# Patient Record
Sex: Female | Born: 1945 | Race: White | Hispanic: No | Smoking: Former smoker
Health system: Southern US, Community
[De-identification: ages and names within clinical notes are randomized; demographics above are authoritative.]

## PROBLEM LIST (undated history)

## (undated) DIAGNOSIS — M2550 Pain in unspecified joint: Secondary | ICD-10-CM

## (undated) DIAGNOSIS — R61 Generalized hyperhidrosis: Secondary | ICD-10-CM

## (undated) DIAGNOSIS — R52 Pain, unspecified: Secondary | ICD-10-CM

## (undated) DIAGNOSIS — R05 Cough: Secondary | ICD-10-CM

## (undated) DIAGNOSIS — Z973 Presence of spectacles and contact lenses: Secondary | ICD-10-CM

## (undated) DIAGNOSIS — R252 Cramp and spasm: Secondary | ICD-10-CM

## (undated) DIAGNOSIS — C50919 Malignant neoplasm of unspecified site of unspecified female breast: Secondary | ICD-10-CM

## (undated) DIAGNOSIS — I890 Lymphedema, not elsewhere classified: Secondary | ICD-10-CM

## (undated) DIAGNOSIS — I480 Paroxysmal atrial fibrillation: Secondary | ICD-10-CM

## (undated) DIAGNOSIS — M8580 Other specified disorders of bone density and structure, unspecified site: Secondary | ICD-10-CM

## (undated) DIAGNOSIS — R053 Chronic cough: Secondary | ICD-10-CM

## (undated) DIAGNOSIS — Z923 Personal history of irradiation: Secondary | ICD-10-CM

## (undated) DIAGNOSIS — J349 Unspecified disorder of nose and nasal sinuses: Secondary | ICD-10-CM

## (undated) DIAGNOSIS — I499 Cardiac arrhythmia, unspecified: Secondary | ICD-10-CM

## (undated) HISTORY — DX: Chronic cough: R05.3

## (undated) HISTORY — DX: Cramp and spasm: R25.2

## (undated) HISTORY — DX: Malignant neoplasm of unspecified site of unspecified female breast: C50.919

## (undated) HISTORY — DX: Paroxysmal atrial fibrillation: I48.0

## (undated) HISTORY — DX: Pain, unspecified: R52

## (undated) HISTORY — DX: Cough: R05

## (undated) HISTORY — DX: Pain in unspecified joint: M25.50

## (undated) HISTORY — DX: Presence of spectacles and contact lenses: Z97.3

## (undated) HISTORY — DX: Other specified disorders of bone density and structure, unspecified site: M85.80

## (undated) HISTORY — DX: Unspecified disorder of nose and nasal sinuses: J34.9

## (undated) HISTORY — PX: BREAST SURGERY: SHX581

## (undated) HISTORY — DX: Generalized hyperhidrosis: R61

---

## 1982-09-17 HISTORY — PX: TUBAL LIGATION: SHX77

## 2005-04-03 ENCOUNTER — Ambulatory Visit (HOSPITAL_COMMUNITY): Admission: RE | Admit: 2005-04-03 | Discharge: 2005-04-03 | Payer: Self-pay | Admitting: Internal Medicine

## 2005-04-13 ENCOUNTER — Encounter: Admission: RE | Admit: 2005-04-13 | Discharge: 2005-04-13 | Payer: Self-pay | Admitting: Internal Medicine

## 2007-02-11 ENCOUNTER — Ambulatory Visit (HOSPITAL_COMMUNITY): Admission: RE | Admit: 2007-02-11 | Discharge: 2007-02-11 | Payer: Self-pay | Admitting: Internal Medicine

## 2007-02-19 ENCOUNTER — Ambulatory Visit (HOSPITAL_COMMUNITY): Admission: RE | Admit: 2007-02-19 | Discharge: 2007-02-19 | Payer: Self-pay | Admitting: Internal Medicine

## 2007-08-27 ENCOUNTER — Ambulatory Visit (HOSPITAL_COMMUNITY): Admission: RE | Admit: 2007-08-27 | Discharge: 2007-08-27 | Payer: Self-pay | Admitting: Internal Medicine

## 2008-03-01 ENCOUNTER — Ambulatory Visit (HOSPITAL_COMMUNITY): Admission: RE | Admit: 2008-03-01 | Discharge: 2008-03-01 | Payer: Self-pay | Admitting: Internal Medicine

## 2008-09-17 HISTORY — PX: BREAST LUMPECTOMY: SHX2

## 2009-03-08 ENCOUNTER — Ambulatory Visit (HOSPITAL_COMMUNITY): Admission: RE | Admit: 2009-03-08 | Discharge: 2009-03-08 | Payer: Self-pay | Admitting: Internal Medicine

## 2009-03-11 ENCOUNTER — Ambulatory Visit (HOSPITAL_COMMUNITY): Admission: RE | Admit: 2009-03-11 | Discharge: 2009-03-11 | Payer: Self-pay | Admitting: Internal Medicine

## 2009-03-17 DIAGNOSIS — C50919 Malignant neoplasm of unspecified site of unspecified female breast: Secondary | ICD-10-CM

## 2009-03-17 HISTORY — DX: Malignant neoplasm of unspecified site of unspecified female breast: C50.919

## 2009-03-31 ENCOUNTER — Ambulatory Visit (HOSPITAL_COMMUNITY): Admission: RE | Admit: 2009-03-31 | Discharge: 2009-03-31 | Payer: Self-pay | Admitting: Internal Medicine

## 2009-04-11 ENCOUNTER — Encounter: Admission: RE | Admit: 2009-04-11 | Discharge: 2009-04-11 | Payer: Self-pay | Admitting: Surgery

## 2009-04-14 ENCOUNTER — Encounter: Admission: RE | Admit: 2009-04-14 | Discharge: 2009-04-14 | Payer: Self-pay | Admitting: Surgery

## 2009-04-14 ENCOUNTER — Encounter (INDEPENDENT_AMBULATORY_CARE_PROVIDER_SITE_OTHER): Payer: Self-pay | Admitting: Surgery

## 2009-04-14 ENCOUNTER — Ambulatory Visit (HOSPITAL_BASED_OUTPATIENT_CLINIC_OR_DEPARTMENT_OTHER): Admission: RE | Admit: 2009-04-14 | Discharge: 2009-04-14 | Payer: Self-pay | Admitting: Surgery

## 2009-04-26 ENCOUNTER — Ambulatory Visit: Admission: RE | Admit: 2009-04-26 | Discharge: 2009-07-25 | Payer: Self-pay | Admitting: Radiation Oncology

## 2009-05-09 ENCOUNTER — Encounter (INDEPENDENT_AMBULATORY_CARE_PROVIDER_SITE_OTHER): Payer: Self-pay | Admitting: Surgery

## 2009-05-09 ENCOUNTER — Ambulatory Visit (HOSPITAL_BASED_OUTPATIENT_CLINIC_OR_DEPARTMENT_OTHER): Admission: RE | Admit: 2009-05-09 | Discharge: 2009-05-09 | Payer: Self-pay | Admitting: Surgery

## 2009-05-17 ENCOUNTER — Ambulatory Visit (HOSPITAL_COMMUNITY): Payer: Self-pay | Admitting: Oncology

## 2009-05-24 ENCOUNTER — Ambulatory Visit (HOSPITAL_COMMUNITY): Admission: RE | Admit: 2009-05-24 | Discharge: 2009-05-24 | Payer: Self-pay | Admitting: Oncology

## 2009-08-05 ENCOUNTER — Ambulatory Visit (HOSPITAL_COMMUNITY): Payer: Self-pay | Admitting: Oncology

## 2009-09-06 ENCOUNTER — Other Ambulatory Visit: Admission: RE | Admit: 2009-09-06 | Discharge: 2009-09-06 | Payer: Self-pay | Admitting: Obstetrics & Gynecology

## 2009-09-17 HISTORY — PX: BREAST LUMPECTOMY: SHX2

## 2010-03-17 ENCOUNTER — Ambulatory Visit (HOSPITAL_COMMUNITY): Payer: Self-pay | Admitting: Oncology

## 2010-04-05 ENCOUNTER — Ambulatory Visit (HOSPITAL_COMMUNITY): Admission: RE | Admit: 2010-04-05 | Discharge: 2010-04-05 | Payer: Self-pay | Admitting: Internal Medicine

## 2010-04-24 ENCOUNTER — Encounter: Admission: RE | Admit: 2010-04-24 | Discharge: 2010-04-24 | Payer: Self-pay | Admitting: Obstetrics and Gynecology

## 2010-04-25 ENCOUNTER — Encounter: Admission: RE | Admit: 2010-04-25 | Discharge: 2010-04-25 | Payer: Self-pay | Admitting: Surgery

## 2010-04-25 ENCOUNTER — Ambulatory Visit (HOSPITAL_BASED_OUTPATIENT_CLINIC_OR_DEPARTMENT_OTHER): Admission: RE | Admit: 2010-04-25 | Discharge: 2010-04-25 | Payer: Self-pay | Admitting: Obstetrics and Gynecology

## 2010-05-01 IMAGING — MG MM BREAST WIRE LOCALIZATION*R*
5 series · 5 of 5 positions shown · non-contrast
Comparison: none

CLINICAL DATA: Patient presents for needle localization of a known
cancer at the 12 o'clock position of the right breast which is an
area of parenchymal distortion mammographically.  There are two
surgical micro clips at the recent biopsy site at the 12 o'clock
position as the more posterior clip will be used for localization
as this was noted to represent the biopsy site.

[R CC (1 of 2)]
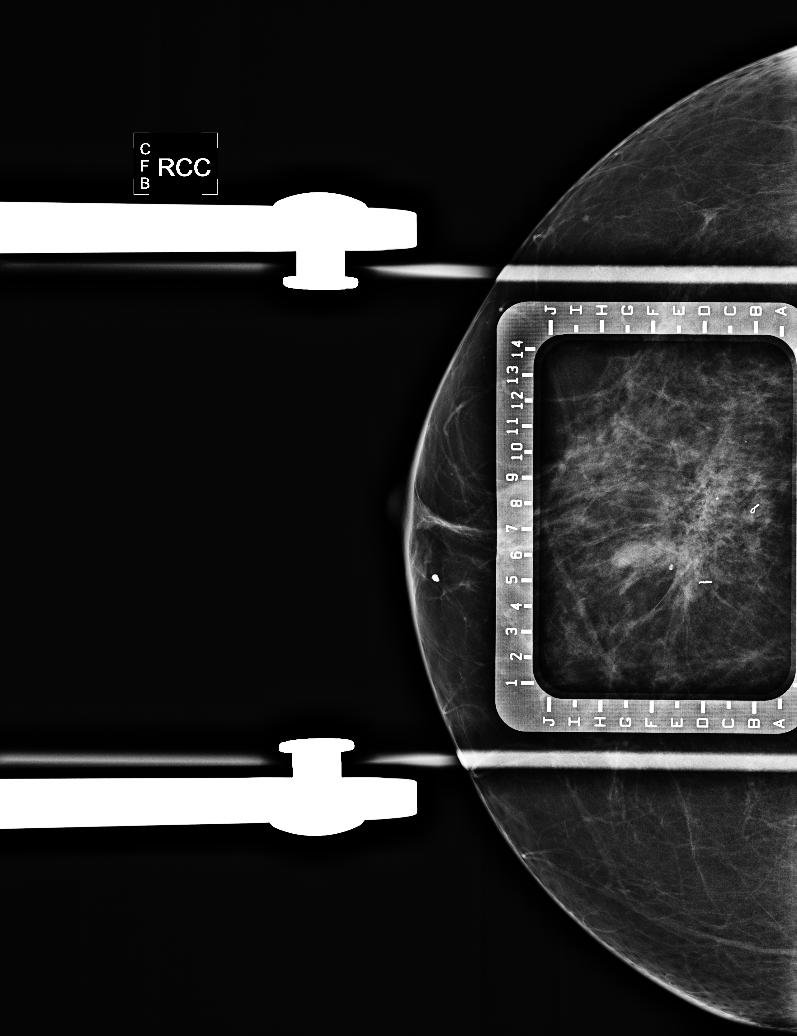

[R CC (2 of 2)]
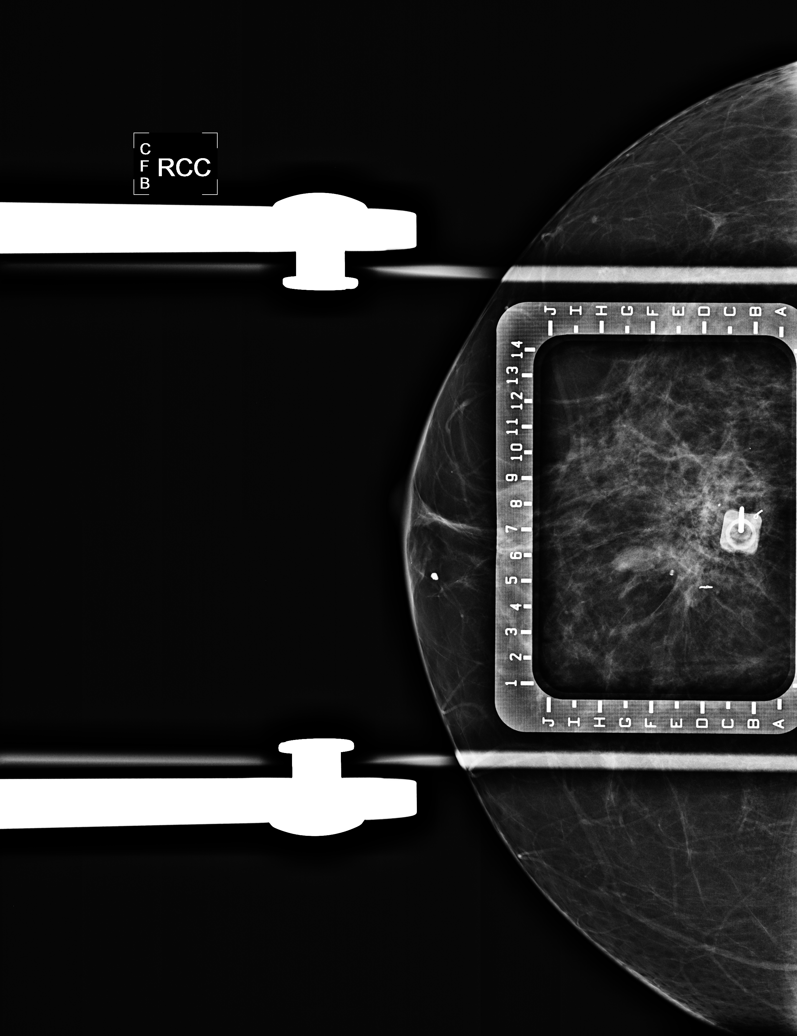

[R ML (1 of 3)]
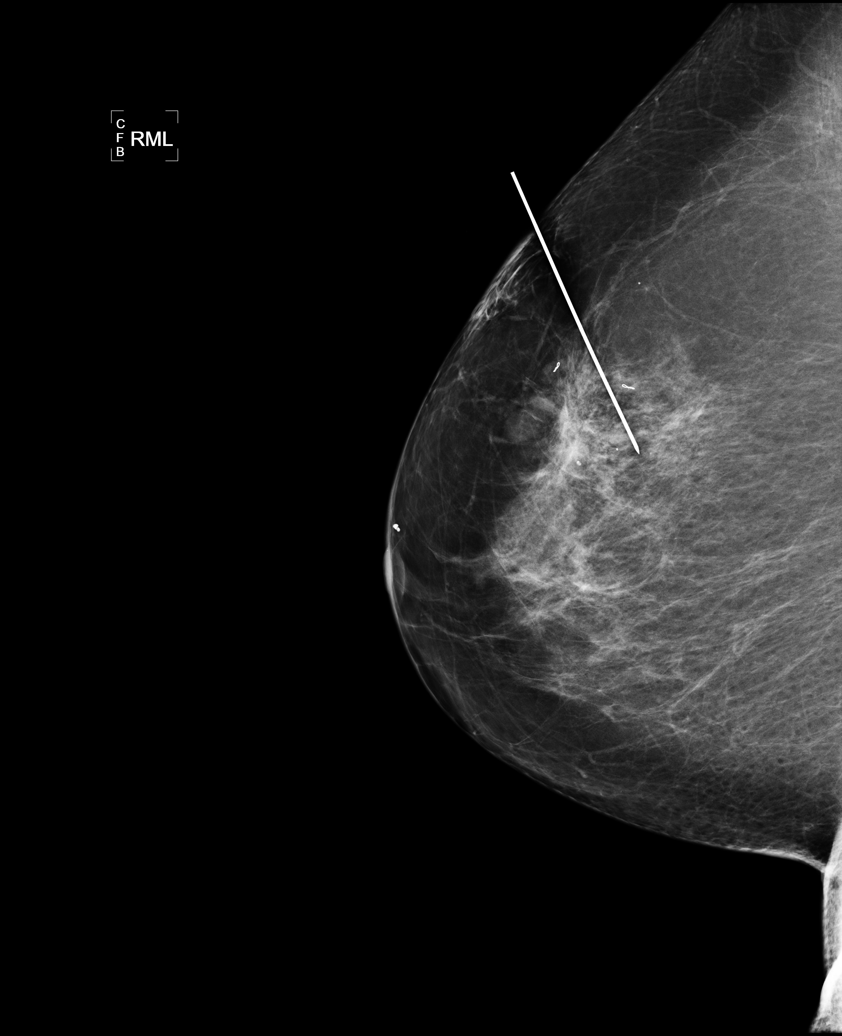

[R ML (2 of 3)]
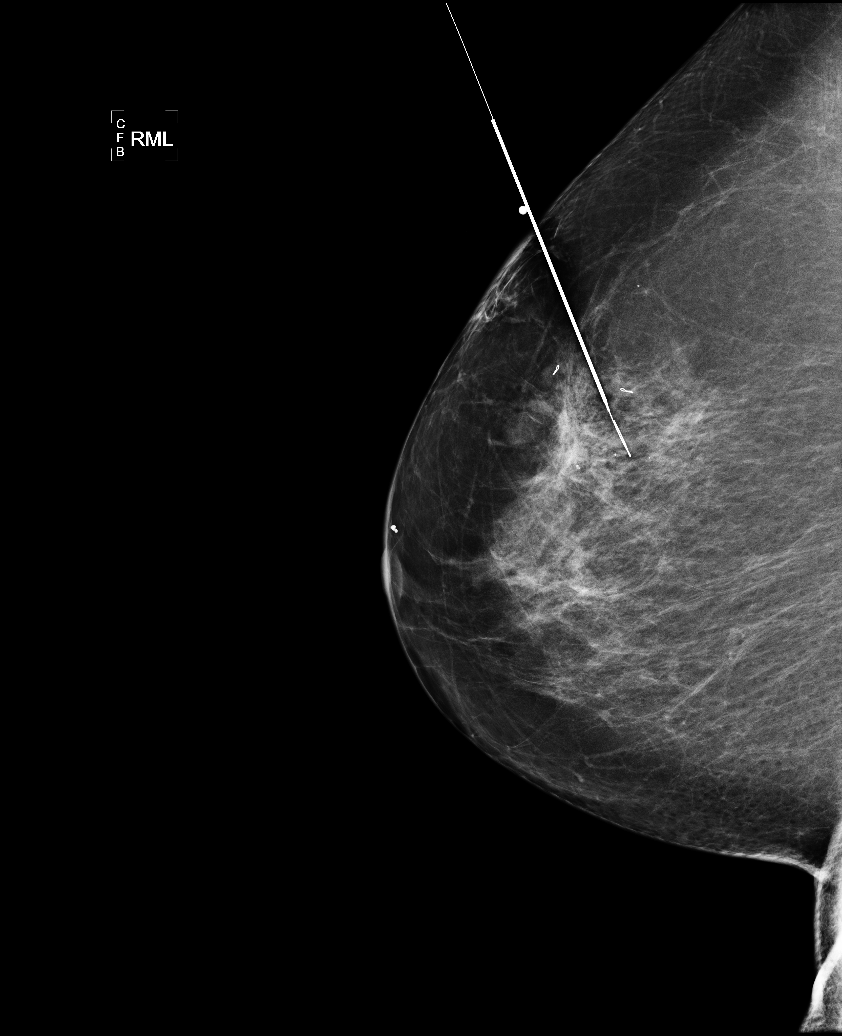

[R ML (3 of 3)]
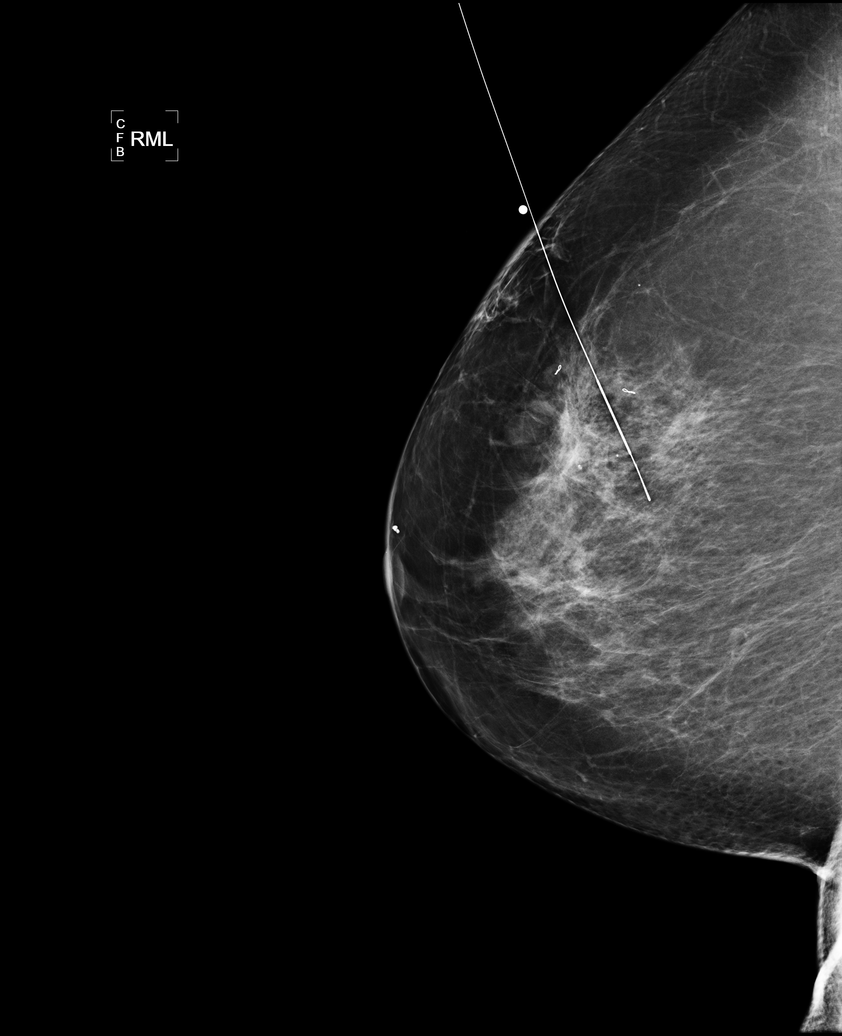

[5 of 5 positions shown; findings below may reference images not displayed]

NEEDLE LOCALIZATION WITH MAMMOGRAPHIC GUIDANCE AND SPECIMEN
RADIOGRAPH

Patient presents for needle localization prior to surgical
excision/lumpectomy.  I met with the patient and we discussed the
procedure of needle localization including risks, benefits, and
alternatives. Specifically, we discussed the risks of infection,
bleeding, tissue injury, and inadequate sampling. Informed written
consent was given.

Using mammographic guidance, sterile technique, 2% lidocaine and a
7 cm modified Kopans needle, the region of distortion marked by the
micro clip was localized using a superior approach.  The films are
marked for Dr. Shante.

Specimen radiograph was performed at day [HOSPITAL], and
confirms the targeted micro clips and parenchymal density present
in the tissue sample.  The specimen is marked for pathology.
IMPRESSION: Needle localization right breast.  No apparent complications.

## 2010-05-12 ENCOUNTER — Ambulatory Visit (HOSPITAL_COMMUNITY): Payer: Self-pay | Admitting: Oncology

## 2010-05-29 ENCOUNTER — Ambulatory Visit
Admission: RE | Admit: 2010-05-29 | Discharge: 2010-07-24 | Payer: Self-pay | Source: Home / Self Care | Admitting: Radiation Oncology

## 2010-08-15 ENCOUNTER — Ambulatory Visit (HOSPITAL_COMMUNITY): Payer: Self-pay | Admitting: Oncology

## 2010-08-15 ENCOUNTER — Encounter (HOSPITAL_COMMUNITY)
Admission: RE | Admit: 2010-08-15 | Discharge: 2010-09-14 | Payer: Self-pay | Source: Home / Self Care | Attending: Oncology | Admitting: Oncology

## 2010-09-26 ENCOUNTER — Other Ambulatory Visit
Admission: RE | Admit: 2010-09-26 | Discharge: 2010-09-26 | Payer: Self-pay | Source: Home / Self Care | Admitting: Obstetrics & Gynecology

## 2010-10-08 ENCOUNTER — Encounter: Payer: Self-pay | Admitting: Internal Medicine

## 2010-11-07 ENCOUNTER — Ambulatory Visit (HOSPITAL_COMMUNITY): Payer: Medicare Other | Admitting: Oncology

## 2010-11-07 ENCOUNTER — Encounter (HOSPITAL_COMMUNITY): Payer: Medicare Other | Attending: Oncology

## 2010-11-07 DIAGNOSIS — C50919 Malignant neoplasm of unspecified site of unspecified female breast: Secondary | ICD-10-CM

## 2010-11-07 DIAGNOSIS — R635 Abnormal weight gain: Secondary | ICD-10-CM | POA: Insufficient documentation

## 2010-11-07 DIAGNOSIS — Z79899 Other long term (current) drug therapy: Secondary | ICD-10-CM | POA: Insufficient documentation

## 2010-12-01 LAB — COMPREHENSIVE METABOLIC PANEL
ALT: 50 U/L — ABNORMAL HIGH (ref 0–35)
AST: 33 U/L (ref 0–37)
Albumin: 3.7 g/dL (ref 3.5–5.2)
Alkaline Phosphatase: 84 U/L (ref 39–117)
CO2: 25 mEq/L (ref 19–32)
Chloride: 106 mEq/L (ref 96–112)
Creatinine, Ser: 0.81 mg/dL (ref 0.4–1.2)
GFR calc Af Amer: >60 mL/min (ref >60)
GFR calc non Af Amer: >60 mL/min (ref >60)
Potassium: 4.1 mEq/L (ref 3.5–5.1)
Sodium: 138 mEq/L (ref 135–145)
Total Bilirubin: 0.5 mg/dL (ref 0.3–1.2)

## 2010-12-01 LAB — DIFFERENTIAL
Basophils Absolute: 0 10*3/uL (ref 0.0–0.1)
Eosinophils Absolute: 0.2 10*3/uL (ref 0.0–0.7)
Eosinophils Relative: 3 % (ref 0–5)
Lymphocytes Relative: 26 % (ref 12–46)
Monocytes Absolute: 0.7 10*3/uL (ref 0.1–1.0)

## 2010-12-01 LAB — LACTATE DEHYDROGENASE: LDH: 185 U/L (ref 94–250)

## 2010-12-01 LAB — CANCER ANTIGEN 27.29: CA 27.29: 26 U/mL (ref 0–39)

## 2010-12-01 LAB — CBC
Hemoglobin: 13.3 g/dL (ref 12.0–15.0)
MCH: 31.2 pg (ref 26.0–34.0)
Platelets: 194 10*3/uL (ref 150–400)
RBC: 4.26 MIL/uL (ref 3.87–5.11)
WBC: 5.7 10*3/uL (ref 4.0–10.5)

## 2010-12-24 LAB — COMPREHENSIVE METABOLIC PANEL
Albumin: 3.9 g/dL (ref 3.5–5.2)
BUN: 16 mg/dL (ref 6–23)
CO2: 25 mEq/L (ref 19–32)
Calcium: 8.9 mg/dL (ref 8.4–10.5)
Chloride: 108 mEq/L (ref 96–112)
Creatinine, Ser: 0.79 mg/dL (ref 0.4–1.2)
GFR calc non Af Amer: >60 mL/min (ref >60)
Total Bilirubin: 0.4 mg/dL (ref 0.3–1.2)

## 2010-12-24 LAB — CBC
HCT: 38.9 % (ref 36.0–46.0)
MCHC: 34.6 g/dL (ref 30.0–36.0)
MCV: 91 fL (ref 78.0–100.0)
Platelets: 232 10*3/uL (ref 150–400)
WBC: 6.5 10*3/uL (ref 4.0–10.5)

## 2010-12-24 LAB — DIFFERENTIAL
Basophils Absolute: 0 10*3/uL (ref 0.0–0.1)
Lymphocytes Relative: 30 % (ref 12–46)
Lymphs Abs: 2 10*3/uL (ref 0.7–4.0)
Neutro Abs: 3.9 10*3/uL (ref 1.7–7.7)

## 2010-12-24 LAB — LACTATE DEHYDROGENASE: LDH: 191 U/L (ref 94–250)

## 2011-01-24 ENCOUNTER — Encounter (INDEPENDENT_AMBULATORY_CARE_PROVIDER_SITE_OTHER): Payer: Self-pay | Admitting: Surgery

## 2011-01-30 NOTE — Op Note (Signed)
[name], [name]                ACCOUNT NO.:  [id number]   MEDICAL RECORD NO.:  [id number]          PATIENT TYPE:  AMB   LOCATION:  DSC                          FACILITY:  [facility]   PHYSICIAN:  [name]. [name], M.D.  DATE OF BIRTH:  [birth year]   DATE OF PROCEDURE:  04/14/2009  DATE OF DISCHARGE:                               OPERATIVE REPORT   Date of Surgery   PREOPERATIVE DIAGNOSIS:  Right breast carcinoma, approximately 12  o'clock position.   POSTOPERATIVE DIAGNOSIS:  Right breast carcinoma, (approximately 12  o'clock position), negative sentinel lymph node biopsy, stage T1c N0.   PROCEDURES:  1. Injection of methylene blue.  2. Right axillary sentinel lymph node biopsy.  3. Right breast lumpectomy using needle localization.   SURGEON:  [name]. [name], MD   FIRST ASSISTANT:  None.   ANESTHESIA:  General endotracheal.   ESTIMATED BLOOD LOSS:  100 mL.   DRAINS LEFT:  None.   INDICATIONS FOR PROCEDURE:  [name] is a 65 year old white female who  underwent a recent mammogram that showed an abnormal area in her right  breast.  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  Because of a history  of metal object in her eye, she is not a candidate for MRI.   I discussed with her the options for treatment.  She has elected to  proceed with a lumpectomy and sentinel lymph node biopsy.  I discussed  with her the indication, potential complications.  The potential  complications include, but not limited to, bleeding, infection, nerve  injury, recurrence of the tumor locally, or inadequate excision, which  would require further surgery.   OPERATIVE NOTE:  The patient was placed in a supine position, given a  general endotracheal anesthetic.  She had LMA in place.  She had PAS  stockings in place.  I had given antibiotics at the beginning of the  procedure.  Her chest and upper abdomen were prepped with  ChloraPrep and  sterilely draped, and a time-out was held identifying the patient and  the procedure.   [facility] a wire coming from a  right breast at about the 12 o'clock position, and I had marked her  right side with a pen before coming into the operating room.  A time-out  was held identifying the patient and the procedure.   She had also undergone an injection of a 1 mCi of technetium sulfur  colloid in the subareolar space.  At the beginning of the operation I  injected about 1.5 mL of 60% methylene blue.  At first, I went  identifying the sentinel lymph node which was in the right axilla, sort  of low.  I cut down and identified a hot node with counts of about 750  with a background about 10, but I did not see much in the way of blue  with the node.  This was sent to pathology, but sounds smear count 2  nodes.  Touch prep  was negative.   I then irrigated the wound and packed it open.  I then turned my  attention to the right breast where a wire was coming at about 4 or 5 cm  above her areola at the 12 o'clock position.  I made an incision into  the breast, cut down around the wire, taking out a block of breast  tissue approximately 6-7 cm in width.  I got to the very tip of the wire  which was about 2.5 cm beyond the clip, so I thought I had good room  both anteriorly and posteriorly and below the clip.   I used a paint kit to orient the specimen.  We then shot a specimen  mammogram, which confirmed both clips which had been placed in the  breast were in the specimen and the wire.  Once I confirmed the specimen  had both the clips and the wire in place, it was painted and this was  sent to pathology.  I then irrigated both the wounds, closed the  subcutaneous tissues with 3-0 Vicryl suture, the skin with a Dermabond,  painted each with tincture of benzoin and Steri-Strips.   The patient tolerated the procedure well.  Sponge and needle  counts were  correct at the end of the case.      [name]. [name], M.D.  Electronically Signed     DHN/MEDQ  D:  04/14/2009  T:  04/15/2009  Job:  [id number]   cc:   [name]. [name], MD  Dr. Winferd Humphrey

## 2011-01-30 NOTE — Op Note (Signed)
Heidi Lopez, Heidi Lopez                ACCOUNT NO.:  [id number]   MEDICAL RECORD NO.:  [id number]          PATIENT TYPE:  AMB   LOCATION:  DSC                          FACILITY:  [facility]   PHYSICIAN:  [name]. [name], M.D.  DATE OF BIRTH:  1946-07-03   DATE OF PROCEDURE:  05/09/2009  DATE OF DISCHARGE:                               OPERATIVE REPORT   Date of surgery ?   PREOPERATIVE DIAGNOSIS:  Right breast cancer at 12 o'clock position and  T1c, N0 with close inferior margins.   POSTOPERATIVE DIAGNOSIS:  Right breast cancer at 12 o'clock position,  T1c, N0 with close inferior margins.   PROCEDURE:  Right breast biopsy with excision of inferior margin of  prior breast biopsy cavity.   SURGEON:  [name]. [name], MD   FIRST ASSISTANT:  None.   ANESTHESIA:  LMA with approximately 30 mL of 0.25% Marcaine.   COMPLICATIONS:  None.   INDICATIONS FOR PROCEDURE:  Ms. Bunte is a 65 year old white female,  patient of Dr. Carylon Perches, who has a right breast cancer, treated with  lumpectomy and sentinel lymph node biopsy on April 14, 2009.  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  The patient  has been presented options and discussed, but tumor conference mostly at  the inferior margin more widely excised.   The indications and potential risks of the procedure ere explained to  the patient.  Potential risks include but not limited bleeding,  infection, nerve injury, recurrence of the tumor, and that she is going  to again have inadequate margins.   OPERATIVE NOTE:  The patient was placed in the supine position and given  a LMA anesthesia supervised by Dr. Heather Roberts.  The right breast was  prepped with DuraPrep and sterilely draped and a time-out was held  identifying the patient and procedure and we reviewed the surgical  checklist.   I then made an incision excising the old scar getting  down to a cavity.  Actually, her biopsy cavity was inferior to the incision by about 3 cm  across.  I excised the inferior wall of the cavity.  I marked it with a  long suture medially and a short suture anteriorly and figure the cavity  wall would be the superior margin.   I then irrigated the wound out with 2 L of saline, closed the  subcutaneous tissue with 3-0 Vicryl suture, the skin with a 5-0  subcuticular suture, painted the wound with a tincture of benzoin and  Steri-Strips.  The patient tolerated the procedure well.  Sponge and  needle counts were correct at the end of the case.  Final pathology is  pending at the time of this dictation.  The patient was transported to  the recovery room in good condition.      [name]. [name], M.D.  Electronically Signed     DHN/MEDQ  D:  05/09/2009  T:  05/10/2009  Job:  [id number]   cc:   [name]. [name],  MD  [name], MD  Ladona Horns. Mariel Sleet, MD

## 2011-03-09 ENCOUNTER — Other Ambulatory Visit (HOSPITAL_COMMUNITY): Payer: Self-pay | Admitting: Oncology

## 2011-03-09 DIAGNOSIS — Z9889 Other specified postprocedural states: Secondary | ICD-10-CM

## 2011-03-09 DIAGNOSIS — C50919 Malignant neoplasm of unspecified site of unspecified female breast: Secondary | ICD-10-CM

## 2011-04-06 ENCOUNTER — Other Ambulatory Visit (HOSPITAL_COMMUNITY): Payer: Self-pay | Admitting: Internal Medicine

## 2011-04-06 ENCOUNTER — Ambulatory Visit (HOSPITAL_COMMUNITY)
Admission: RE | Admit: 2011-04-06 | Discharge: 2011-04-06 | Disposition: A | Discharge status: Home / Self Care | Payer: Medicare Other | Source: Ambulatory Visit | Attending: Internal Medicine | Admitting: Internal Medicine

## 2011-04-06 DIAGNOSIS — R05 Cough: Secondary | ICD-10-CM

## 2011-04-06 DIAGNOSIS — Z853 Personal history of malignant neoplasm of breast: Secondary | ICD-10-CM | POA: Insufficient documentation

## 2011-04-06 DIAGNOSIS — R059 Cough, unspecified: Secondary | ICD-10-CM | POA: Insufficient documentation

## 2011-04-11 ENCOUNTER — Other Ambulatory Visit (HOSPITAL_COMMUNITY): Payer: Self-pay | Admitting: Oncology

## 2011-04-11 ENCOUNTER — Ambulatory Visit (HOSPITAL_COMMUNITY)
Admission: RE | Admit: 2011-04-11 | Discharge: 2011-04-11 | Disposition: A | Discharge status: Home / Self Care | Payer: Medicare Other | Source: Ambulatory Visit | Attending: Oncology | Admitting: Oncology

## 2011-04-11 DIAGNOSIS — Z9889 Other specified postprocedural states: Secondary | ICD-10-CM

## 2011-04-11 DIAGNOSIS — C50919 Malignant neoplasm of unspecified site of unspecified female breast: Secondary | ICD-10-CM

## 2011-04-11 DIAGNOSIS — Z853 Personal history of malignant neoplasm of breast: Secondary | ICD-10-CM | POA: Insufficient documentation

## 2011-04-12 ENCOUNTER — Ambulatory Visit (INDEPENDENT_AMBULATORY_CARE_PROVIDER_SITE_OTHER): Payer: Self-pay | Admitting: Surgery

## 2011-05-04 ENCOUNTER — Encounter (INDEPENDENT_AMBULATORY_CARE_PROVIDER_SITE_OTHER): Payer: Self-pay | Admitting: Surgery

## 2011-05-04 ENCOUNTER — Ambulatory Visit (INDEPENDENT_AMBULATORY_CARE_PROVIDER_SITE_OTHER): Payer: Medicare Other | Admitting: Surgery

## 2011-05-04 VITALS — Wt 184.0 lb

## 2011-05-04 DIAGNOSIS — Z853 Personal history of malignant neoplasm of breast: Secondary | ICD-10-CM | POA: Insufficient documentation

## 2011-05-04 NOTE — Progress Notes (Signed)
Chief Complaint  Patient presents with  . Other    f/u 6 month breast reck     Heidi Lopez is a 65 y.o. (DOB: [birth year])  white female who is a patient of FAGAN,ROY, MD and comes to me today for breast cancer followup.  Path (side, TNM): T1, N0 - Left      T1, N0 Invasive lobular - Right Surgery: left lumpectomy Date: 04/26/10       Right lumpectomy   Date:  04/14/2009 Size of tumor: 1.7 cm (left)       0.6 cm (right) Nodes: 0/4 (Left)         NODES: 0/1 (RIGHT)           (Right) ER: 96% PR: 33% Ki67: 80%   HER2Neu: neg (Left) ER: 9%    PR: 96%    Ki67: 9     HER2Neu: neg  Medical Oncologist: Ne[facility]strom Radiation Oncologist: Michell Heinrich  She is doing well.  Her two complaints are:  1.  Left breast spasms.  These seem to come and go.  2.  Increased hair on face. She wonders whether this is related to the tamoxifen.  She otherwise is in good spirits.  PHYSICAL EXAM: Wt 184 lb (83.462 kg)  HEENT:  Pupils equal.  Dentition good.  No injury. NECK:  Supple.  No thyroid mass. LYMPH NODES:  No cervical, supraclavicular, or axillary adenopathy. BREASTS -  RIGHT:  No palpable mass or nodule.  No nipple discharge.   LEFT:  No palpable mass or nodule.  No nipple discharge.  The left breast skin is more pigmented and thicker, probably secondary to more recent rad therapy. UPPER EXTREMITIES:  No evidence of lymphedema.  DATA REVIEWED: Mammogram neg - 04/11/11  ASSESSMENT and PLAN: 1.  T1, N0 left breast cancer. 2011.8.  Disease free.  ? Reason for left breast "spasms"  On tamoxifen  2  T1, N0  Right breast cancer 2010.7  3.  Tamoxifen  Mild hot flashes.  Some vaginal irritation.  She is trying a steroid cream for this.  Complains of hair on face, probably unrelated to the tamoxifen.  4.  Interstitial cystitis.  She's taking cranberry pills and stopped caffiene.  Things are better.

## 2011-05-04 NOTE — Patient Instructions (Signed)
Follow up in 6 months 

## 2011-05-08 ENCOUNTER — Encounter (HOSPITAL_COMMUNITY): Payer: Medicare Other | Attending: Oncology | Admitting: Oncology

## 2011-05-08 ENCOUNTER — Encounter (HOSPITAL_COMMUNITY): Payer: Self-pay | Admitting: Oncology

## 2011-05-08 VITALS — BP 124/73 | HR 68 | Temp 97.8°F | Wt 184.2 lb

## 2011-05-08 DIAGNOSIS — Z853 Personal history of malignant neoplasm of breast: Secondary | ICD-10-CM

## 2011-05-08 DIAGNOSIS — C50919 Malignant neoplasm of unspecified site of unspecified female breast: Secondary | ICD-10-CM

## 2011-05-08 DIAGNOSIS — Z79899 Other long term (current) drug therapy: Secondary | ICD-10-CM | POA: Insufficient documentation

## 2011-05-08 DIAGNOSIS — D0590 Unspecified type of carcinoma in situ of unspecified breast: Secondary | ICD-10-CM

## 2011-05-08 LAB — COMPREHENSIVE METABOLIC PANEL
Albumin: 4 g/dL (ref 3.5–5.2)
Alkaline Phosphatase: 70 U/L (ref 39–117)
BUN: 16 mg/dL (ref 6–23)
CO2: 28 mEq/L (ref 19–32)
Chloride: 100 mEq/L (ref 96–112)
Creatinine, Ser: 0.64 mg/dL (ref 0.50–1.10)
GFR calc Af Amer: >60 mL/min (ref >60)
GFR calc non Af Amer: >60 mL/min (ref >60)
Glucose, Bld: 97 mg/dL (ref 70–99)
Potassium: 4.2 mEq/L (ref 3.5–5.1)
Total Bilirubin: 0.3 mg/dL (ref 0.3–1.2)

## 2011-05-08 LAB — CBC
Hemoglobin: 13.1 g/dL (ref 12.0–15.0)
RBC: 4.2 MIL/uL (ref 3.87–5.11)
WBC: 6.5 10*3/uL (ref 4.0–10.5)

## 2011-05-08 NOTE — Patient Instructions (Signed)
[facility]  Discharge Instructions  RECOMMENDATIONS MADE BY THE CONSULTANT AND ANY TEST RESULTS WILL BE SENT TO YOUR REFERRING DOCTOR.     MEDICATIONS PRESCRIBED: You may use Replense for the feminine itching.    SPECIAL INSTRUCTIONS/FOLLOW-UP: Return to clinic in 6 months. Call with any issues or concerns prior to appointment.    I acknowledge that I have been informed and understand all the instructions given to me and received a copy. I do not have any more questions at this time, but understand that I may call the Specialty Clinic at [facility] at [phone] during business hours should I have any further questions or need assistance in obtaining follow-up care.    __________________________________________  _____________  __________ Signature of Patient or Authorized Representative            Date                   Time    __________________________________________ Nurse's Signature

## 2011-05-08 NOTE — Progress Notes (Signed)
CC:   Heidi Lopez. [name], MD [name], M.D. [name]. [name], M.D.  DIAGNOSES: 1. Left-sided breast cancer, invasive ductal type, 1.7 cm in size with     lymphovascular invasion with 4 negative nodes.  It was estrogen     receptor positive at 95%, progesterone receptor positive at 96%, Ki-     67 marker low at 9%, HER2/neu negative, and she had a lumpectomy,     followed by radiation therapy with surgery on 04/26/2010.  She     finished radiation on 07/21/2010 and started tamoxifen on     08/16/2010. 2. 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  She had a lumpectomy and     sentinel node biopsy that was negative.  She had radiation therapy     then.  Estrogen receptor was 96%, progesterone receptor was 33%, Ki-     67 marker low at 8%, and HER2/neu was negative.  She not take     hormonal therapy after the radiation at that time. 3. History of interstitial cystitis, treated by Dr. Despina Hidden. 4. Mild obesity, weighing 184 pounds, but it is down from 189 pounds. 5. Left breast cyst removal 20+ years ago by Dr. Francina Ames.  Heidi Lopez also had a bronchitis about 6 to 8 weeks ago, treated with 2 antibiotics, she states by Dr. [name] which finally cleared.  She has some postnasal drip now and still coughs once in a while, but feels fine.  Her biggest problem is shooting pains in the left breast periodically that only last 1 to 3 seconds and then they are gone.  They feel like spasm, she states.  I suspect they are from nerves trying to regenerate.  Other than that, she is not aware of any lumps anywhere and she has no burning on urination presently.  She does have a little itching in the vaginal area since starting the tamoxifen, but no discharge.  She is not sexually active presently, but at one time when she was married, she had multiple urinary tract  infections.  This does not feel like the same.  REVIEW OF SYSTEMS:  The rest of her oncologic review of systems is negative.  PHYSICAL EXAMINATION:  Vital Signs:  Show that her weight is 184 pounds, blood pressure 124/73.  She is afebrile, pulse 68 and regular, respirations 16 and unlabored.  Lymph:  She has no lymphadenopathy in any location, including cervical, supraclavicular, infraclavicular, axillary, and inguinal areas.  Abdomen:  Soft, nontender, without organomegaly.  Bowel sounds are normal.  Heart:  Shows a regular rhythm and rate without murmur, rub, or gallop.  Lungs:  Clear to auscultation and percussion.  Neck:  She has no thyromegaly.  Extremities:  She has no arm or leg edema.  Breasts:  The breast exam on the right shows only surgical changes and the left shows mild erythema, really more pinkness than anything else and surgical changes.  There is no mass in either breast.  Neurologic:  She is alert and oriented.  Heidi Lopez is going to try Replens twice a day and if she is not better will call me.  I would consider low-dose estrogen cream 2 or 3 times a week to see if that would help symptomatically since she is pretty bothered by the itching.  Again she has no rash that she is aware  of and no discharge.  We will see her back in 6 months.  She will continue the tamoxifen for a total of 5 years.  We will get some baseline blood work today since it has been some time since we had any and she told me also she had a chest x-ray about 2 months ago from Dr. [name] because of the cold.  She states that she was worried about her lungs being involved by the tumor, which is why she wanted it done.    ______________________________ Ladona Horns. Mariel Sleet, MD ESN/MEDQ  D:  05/08/2011  T:  05/08/2011  Job:  [id number]

## 2011-09-28 ENCOUNTER — Ambulatory Visit (HOSPITAL_COMMUNITY)
Admission: RE | Admit: 2011-09-28 | Discharge: 2011-09-28 | Disposition: A | Discharge status: Home / Self Care | Payer: Medicare Other | Source: Ambulatory Visit | Attending: Internal Medicine | Admitting: Internal Medicine

## 2011-09-28 ENCOUNTER — Other Ambulatory Visit (HOSPITAL_COMMUNITY): Payer: Self-pay | Admitting: Internal Medicine

## 2011-09-28 DIAGNOSIS — M25572 Pain in left ankle and joints of left foot: Secondary | ICD-10-CM

## 2011-09-28 DIAGNOSIS — M25579 Pain in unspecified ankle and joints of unspecified foot: Secondary | ICD-10-CM | POA: Insufficient documentation

## 2011-10-08 ENCOUNTER — Other Ambulatory Visit (HOSPITAL_COMMUNITY)
Admission: RE | Admit: 2011-10-08 | Discharge: 2011-10-08 | Disposition: A | Discharge status: Home / Self Care | Payer: Medicare Other | Source: Ambulatory Visit | Attending: Obstetrics & Gynecology | Admitting: Obstetrics & Gynecology

## 2011-10-09 ENCOUNTER — Other Ambulatory Visit: Payer: Self-pay | Admitting: Obstetrics & Gynecology

## 2011-10-16 ENCOUNTER — Ambulatory Visit (HOSPITAL_COMMUNITY)
Admission: RE | Admit: 2011-10-16 | Discharge: 2011-10-16 | Disposition: A | Discharge status: Home / Self Care | Payer: Medicare Other | Source: Ambulatory Visit | Attending: Orthopedic Surgery | Admitting: Orthopedic Surgery

## 2011-10-16 DIAGNOSIS — M25673 Stiffness of unspecified ankle, not elsewhere classified: Secondary | ICD-10-CM | POA: Insufficient documentation

## 2011-10-16 DIAGNOSIS — M25579 Pain in unspecified ankle and joints of unspecified foot: Secondary | ICD-10-CM | POA: Insufficient documentation

## 2011-10-16 DIAGNOSIS — M6281 Muscle weakness (generalized): Secondary | ICD-10-CM | POA: Insufficient documentation

## 2011-10-16 DIAGNOSIS — R262 Difficulty in walking, not elsewhere classified: Secondary | ICD-10-CM | POA: Insufficient documentation

## 2011-10-16 DIAGNOSIS — IMO0001 Reserved for inherently not codable concepts without codable children: Secondary | ICD-10-CM | POA: Insufficient documentation

## 2011-10-16 DIAGNOSIS — M25676 Stiffness of unspecified foot, not elsewhere classified: Secondary | ICD-10-CM | POA: Insufficient documentation

## 2011-10-16 NOTE — Patient Instructions (Addendum)
HEP

## 2011-10-16 NOTE — Evaluation (Signed)
Physical Therapy Evaluation  Patient Details  Name: Heidi Lopez MRN: [id number] Date of Birth: April 22, 1946  Today's Date: 10/16/2011 Time: 6962-9528 Time Calculation (min): 53 min  Visit#: 1  of 12   Re-eval: 11/15/11 Assessment Diagnosis: L posterior tibial tendonitis Next MD Visit: 11/10/11 Prior Therapy: none  Past Medical History:  Past Medical History  Diagnosis Date  . Cancer of breast July 2010  . Glaucoma   . Cramps, muscle, general     severe breast cramping   . Heart palpitations   . Night sweats   . Pain     breast pain  . Chronic coughing   . Sinus problem   . Wears glasses   . Glaucoma     left eye   . Joint pain     foot and knee pain   Past Surgical History:  Past Surgical History  Procedure Date  . Breast surgery 2010    for cancer  . Tubal ligation 1984    Subjective Symptoms/Limitations Symptoms: [name] states that she began to have pain about two years ago that was intermittent.  She states that since Christmas her pain has been constant.  The patient states the pain starts on the inside of her foot and then if she stays up on her leg to long the pain will go up her leg.  She was placed in a Cam boot for approximately a month.  She has 50% less pain when she has her boot on but as soon as she takes it off she notices almost immediate pain.  She is now being referred to therapy to attempt to decrease her pain. How long can you sit comfortably?: no problem. How long can you stand comfortably?: Pt states that she does not give into the pain but she has pain almost immediately upon standing. How long can you walk comfortably?: Pt has pain almost immediatly.Marland Kitchen Special Tests: Prior to taking her medication which was given her a week ago she was waking several times a night due to the pain. Pain Assessment Currently in Pain?: No/denies (worst pain in the past week has been a 9) Pain Location: Ankle Pain Orientation: Left Pain Type: Chronic pain Pain  Onset: More than a month ago Pain Frequency: Constant Pain Relieving Factors: medication; camboot Effect of Pain on Daily Activities: increases pain.  Precautions/Restrictions     Prior Functioning  Prior Function Vocation: Retired Leisure: Hobbies-yes (Comment) Comments: walking, gardening  Cognition/Observation Observation/Other Assessments Observations: Pt L foot pronates while standing Other Assessments: L foot with increased swelling:  figure 8 R 51.0cm L 52.5 cm  Sensation/Coordination/Flexibility/Functional Tests    Assessment LLE AROM (degrees) Left Ankle Dorsiflexion 0-20: 20  Left Ankle Plantar Flexion 0-45: 45  Left Ankle Inversion: 35  Left Ankle Eversion: 15  LLE Strength Left Ankle Dorsiflexion: 5/5 Left Ankle Plantar Flexion: 5/5 Left Ankle Inversion: 5/5 Left Ankle Eversion: 5/5 Palpation Palpation: post tib tendon tight to palpation   Exercise/Treatments    Ankle Exercises Ankle Circles/Pumps: Limitations Ankle Circles/Pumps Limitations: Gastroc stretch x 1' x 2 Ankle Inversion: AROM;Strengthening;Left;10 reps;Sidelying  Towel Crunch: 2 reps    Modalities Modalities: Ultrasound Manual Therapy Manual Therapy: Massage Massage: from insertion tendon on plantar aspect of foot to mid leg  Ultrasound Ultrasound Location: plantar aspect of foot. Ultrasound Parameters: 1.5 w/cm2  x 8' to increase blood supply and relax mm Ultrasound Goals: Pain  Physical Therapy Assessment and Plan PT Assessment and Plan Clinical Impression Statement: Pt with increased  pain and decreased ability to walk who will benefit from skilled Pt to decrease her sx of pain and return pt to prior functional level. Rehab Potential: Good PT Frequency: Min 3X/week PT Duration: 4 weeks PT Treatment/Interventions: Therapeutic exercise;Other (comment) (modalities to decrease pain) PT Plan: continue to see pt three times a week to decrease sx of pain and improve ability to walk.   Pt to begin marble pick up; PF at leg press with emphaiss of slow return; isometric and t-band for inversion    Goals Home Exercise Program Pt will Perform Home Exercise Program: Independently PT Short Term Goals Time to Complete Short Term Goals: 2 weeks PT Short Term Goal 1: Pain level no greater than a 5  PT Long Term Goals Time to Complete Long Term Goals: 4 weeks PT Long Term Goal 1: Pt to be I in advance HEP PT Long Term Goal 2: Pt pain  level to be no greater than a 3 and at 0 50% of the day. Long Term Goal 3: Pt to have no pain with mm testing Long Term Goal 4: Pt to be able to ambulate 1/2 mile without having increased pain.  Problem List Patient Active Problem List  Diagnoses  . History of breast cancer, bilateral  . Pain in joint, ankle and foot    PT - End of Session Activity Tolerance: Patient tolerated treatment well General Behavior During Session: [facility] for tasks performed Cognition: [facility] for tasks performed   Trevione Wert,CINDY 10/16/2011, 3:06 PM  Physician Documentation Your signature is required to indicate approval of the treatment plan as stated above.  Please sign and either send electronically or make a copy of this report for your files and return this physician signed original.   Please mark one 1.__approve of plan  2. ___approve of plan with the following conditions.   ______________________________                                                          _____________________ Physician Signature                                                                                                             Date

## 2011-10-18 ENCOUNTER — Ambulatory Visit (HOSPITAL_COMMUNITY)
Admission: RE | Admit: 2011-10-18 | Discharge: 2011-10-18 | Disposition: A | Discharge status: Home / Self Care | Payer: Medicare Other | Source: Ambulatory Visit | Attending: Orthopedic Surgery | Admitting: Orthopedic Surgery

## 2011-10-18 NOTE — Progress Notes (Signed)
Physical Therapy Treatment Patient Details  Name: Heidi Lopez MRN: [id number] Date of Birth: Jul 08, 1946  Today's Date: 10/18/2011 Time: 4098-1191 Time Calculation (min): 38 min Visit#: 2  of 12   Re-eval: 11/15/11 Charges:  therex 12', ultrasound 8', manual 10'    Subjective: Symptoms/Limitations Symptoms: Pt. states her foot felt better after last session.  Pt. reports the boot has helped alot. Pain Assessment Currently in Pain?: No/denies   Exercise/Treatments Stretches Gastroc Stretch: 2 reps;60 seconds Seated Other Seated Knee Exercises: toe crunches 2 minutes Other Seated Knee Exercises: Marble pick up 1RT Supine Other Supine Knee Exercises: isometric inversion 5X5" Other Supine Knee Exercises: tband inversion green 10X   Modalities Modalities: Ultrasound Manual Therapy: Massage Massage: Plantar surface of L foot to mid LE following ultrasound. Ultrasound Ultrasound Location: plantar aspect of L foot Ultrasound Parameters: 1.5w/cm2 X 8' to increase blood supply and relax mm  Physical Therapy Assessment and Plan PT Assessment and Plan Clinical Impression Statement: Added isometric and tband strengthening for ankle inversion as well as marble pick up without difficulty. Some tenderness voiced mid medial LE with STM. PT Plan: Add leg press for plantar fexion with emphasis on eccentric phase; give tband for HEP next visit.     Problem List Patient Active Problem List  Diagnoses  . History of breast cancer, bilateral  . Pain in joint, ankle and foot    PT - End of Session Activity Tolerance: Patient tolerated treatment well General Behavior During Session: [facility] for tasks performed Cognition: [facility] for tasks performed  Barrington Worley B. Bascom Levels, PTA 10/18/2011, 3:31 PM

## 2011-10-22 ENCOUNTER — Ambulatory Visit (HOSPITAL_COMMUNITY)
Admission: RE | Admit: 2011-10-22 | Discharge: 2011-10-22 | Disposition: A | Discharge status: Home / Self Care | Payer: Medicare Other | Source: Ambulatory Visit | Attending: Internal Medicine | Admitting: Internal Medicine

## 2011-10-22 ENCOUNTER — Other Ambulatory Visit (HOSPITAL_COMMUNITY): Payer: Self-pay | Admitting: Oncology

## 2011-10-22 DIAGNOSIS — M6281 Muscle weakness (generalized): Secondary | ICD-10-CM | POA: Insufficient documentation

## 2011-10-22 DIAGNOSIS — IMO0001 Reserved for inherently not codable concepts without codable children: Secondary | ICD-10-CM | POA: Insufficient documentation

## 2011-10-22 DIAGNOSIS — M25673 Stiffness of unspecified ankle, not elsewhere classified: Secondary | ICD-10-CM | POA: Insufficient documentation

## 2011-10-22 DIAGNOSIS — M25579 Pain in unspecified ankle and joints of unspecified foot: Secondary | ICD-10-CM

## 2011-10-22 DIAGNOSIS — M25676 Stiffness of unspecified foot, not elsewhere classified: Secondary | ICD-10-CM | POA: Insufficient documentation

## 2011-10-22 DIAGNOSIS — R262 Difficulty in walking, not elsewhere classified: Secondary | ICD-10-CM | POA: Insufficient documentation

## 2011-10-22 NOTE — Progress Notes (Signed)
Physical Therapy Treatment Patient Details  Name: Heidi Lopez MRN: [id number] Date of Birth: 01/06/1946  Today's Date: 10/22/2011 Time: 4098-1191 (Pt started my Donnamae Jude, PT) Time Calculation (min): 55 min Visit#: 3  of 12   Re-eval: 11/15/11 Charges: Manual x 5' [place] x 8' Therex x 25'  Subjective: Symptoms/Limitations Symptoms: Pt states she went shopping over the weekend but needed to stop after an hour due to increased pain. Pain Assessment Currently in Pain?: Yes Pain Score:   3 Pain Location: Foot Pain Orientation: Left Pain Type: Chronic pain   Exercise/Treatments Ankle Exercises Ankle Plantar Flexion: Limitations Ankle Plantar Flexion Limitations: Gastro/plantar /soleus and slant board stretch 3 x 30 sec. Ankle Inversion: Strengthening;Left;Sidelying;Limitations Ankle Inversion Limitations: 2# ABC's: Limitations ABC's Limitations: cybex plantar slow slow up 3pl x 10 Marble pick up x 1 RT  Manual Therapy Massage: To medial foot and mm belly of posterior tibialis x 8' (L LE) Ultrasound Ultrasound Location: mm belly of posterior tibialis (L LE) Ultrasound Parameters: 1.2w/cm2 X 8'   Physical Therapy Assessment and Plan PT Assessment and Plan Clinical Impression Statement: Ankle inversion with blue tband given to HEP. Multiple spasms and tenderness noted on posterior tibialis mm belly with STM. [place] applied to posterior tibialis mm belly. Pt reports pain decrease to 1/10 at end of session. PT Plan: Continue to progress per PT POC.     Problem List Patient Active Problem List  Diagnoses  . History of breast cancer, bilateral  . Pain in joint, ankle and foot    PT - End of Session Activity Tolerance: Patient tolerated treatment well General Behavior During Session: [facility] for tasks performed Cognition: [facility] for tasks performed  Antonieta Iba 10/22/2011, 5:44 PM

## 2011-10-24 ENCOUNTER — Ambulatory Visit (HOSPITAL_COMMUNITY)
Admission: RE | Admit: 2011-10-24 | Discharge: 2011-10-24 | Disposition: A | Discharge status: Home / Self Care | Payer: Medicare Other | Source: Ambulatory Visit | Attending: Internal Medicine | Admitting: Internal Medicine

## 2011-10-24 NOTE — Progress Notes (Signed)
Physical Therapy Treatment Patient Details  Name: Heidi Lopez MRN: [id number] Date of Birth: 10/09/1945  Today's Date: 10/24/2011 Time: 1191-4782 Time Calculation (min): 50 min Visit#: 4  of 12   Re-eval: 11/15/11 Charges:  therex 28', ultrasound 8', manual 8'    Subjective: Symptoms/Limitations Symptoms: Pt. states it's been hurting more since last visit; thinks the theraband flared it up; 8/10 when up/5/10 at rest. Pain Assessment Currently in Pain?: Yes Pain Score:   5 Pain Location: Foot Pain Orientation: Left   Exercise/Treatments Ankle Stretches Slant Board Stretch: 3 reps;30 seconds;Limitations Slant Board Stretch Limitations: gastroc and soleus Machines for Strengthening Cybex Leg Press: PF/DF 3PL 2sets10reps Ankle Exercises - Seated Marble Pickup: 1X Other Seated Ankle Exercises: inversion 3# 10reps     Manual Therapy Manual Therapy: Massage Massage: to L LE medial foot and posterior tibialis 8' Ultrasound Ultrasound Location: L LE medial arch and distal medial malleolus Ultrasound Parameters: 1.2 w/cm2 X 8 minutes Ultrasound Goals: Pain  Physical Therapy Assessment and Plan PT Assessment and Plan Clinical Impression Statement: No spasms noted with massage today and with significant less tenderness up LE as last visit.  Pt. given lighter resistance theratube for HEP and encouraged to continue but decrease reps.  Pt. also given written instructions for gastroc/soleus/plantar fascia stretches to do at home.  Pt. reported being painfree at end of session. PT Plan: Continue per POC     Problem List Patient Active Problem List  Diagnoses  . History of breast cancer, bilateral  . Pain in joint, ankle and foot    PT - End of Session Activity Tolerance: Patient tolerated treatment well General Behavior During Session: [facility] for tasks performed Cognition: [facility] for tasks performed PT Plan of Care PT Home Exercise Plan: given written instructions for  gastroc/soleus/plantar fascia stretches; given lighter resistance theratube (green) for HEP.  Trishna Cwik B. Bascom Levels, PTA 10/24/2011, 4:33 PM

## 2011-10-26 ENCOUNTER — Ambulatory Visit (HOSPITAL_COMMUNITY)
Admission: RE | Admit: 2011-10-26 | Discharge: 2011-10-26 | Disposition: A | Discharge status: Home / Self Care | Payer: Medicare Other | Source: Ambulatory Visit | Attending: Internal Medicine | Admitting: Internal Medicine

## 2011-10-26 DIAGNOSIS — M25579 Pain in unspecified ankle and joints of unspecified foot: Secondary | ICD-10-CM

## 2011-10-26 NOTE — Progress Notes (Signed)
Physical Therapy Treatment Patient Details  Name: Heidi Lopez MRN: [id number] Date of Birth: Feb 13, 1946  Today's Date: 10/26/2011 Time: 1191-4782 Time Calculation (min): 46 min Visit#: 5  of 12   Re-eval: 11/15/11    Subjective: Symptoms/Limitations Symptoms: Pt states pain not bad today, she has not been on feet much today so pain not bad right now.  Pt did state there is pain with tband exercises.  Reported pain relief last session following  [place] last session. Pain Assessment Currently in Pain?: No/denies Pain Score:   2 Pain Location: Foot Pain Orientation: Left  Precautions/Restrictions     Exercise/Treatments Mobility/Balance        Ankle Stretches Slant Board Stretch: 3 reps;30 seconds;Limitations Slant Board Stretch Limitations: gastroc and soleus   Machines for Strengthening Cybex Leg Press: PF/DF 3PL 2sets10reps   Ankle Exercises - Seated Marble Pickup: 1 x   Ankle Exercises - Sidelying Ankle Inversion: Strengthening;Left;Weights Ankle Inversion Weights (lbs): 3#15 Modalities Modalities: Ultrasound Manual Therapy Manual Therapy: Massage Massage: medial aspecto of foot/post tib. Ultrasound Ultrasound Location: medial arch/ proximal AT Ultrasound Parameters: 1.4 w/cm2 Ultrasound Goals: Pain  Physical Therapy Assessment and Plan PT Assessment and Plan Clinical Impression Statement: Significant decreased pain with ultrasound.  Pt given red t-band for home use as pain had increased to a 5 with green t-band. PT Plan: continue with sitting towel inversion with 3# / begin baps next treatment.    Goals    Problem List Patient Active Problem List  Diagnoses  . History of breast cancer, bilateral  . Pain in joint, ankle and foot    PT - End of Session Activity Tolerance: Patient tolerated treatment well General Behavior During Session: [facility] for tasks performed Cognition: [facility] for tasks performed  Heidi Lopez,CINDY 10/26/2011, 3:26 PM

## 2011-10-29 ENCOUNTER — Ambulatory Visit (HOSPITAL_COMMUNITY)
Admission: RE | Admit: 2011-10-29 | Discharge: 2011-10-29 | Disposition: A | Discharge status: Home / Self Care | Payer: Medicare Other | Source: Ambulatory Visit | Attending: Internal Medicine | Admitting: Internal Medicine

## 2011-10-29 NOTE — Progress Notes (Signed)
Physical Therapy Treatment Patient Details  Name: Heidi Lopez MRN: [id number] Date of Birth: Jan 21, 1946  Today's Date: 10/29/2011 Time: 7829-5621 Time Calculation (min): 53 min Visit#: 6  of 12   Re-eval: 11/15/11 Charges: Therex x 35' Manual x 5' [place] x 8'  Subjective: Symptoms/Limitations Symptoms: Ican tell therapy is helping. I have pain when I stand for a long time but I'm not having right now. Pain Assessment Currently in Pain?: No/denies Pain Score: 0-No pain   Exercise/Treatments Ankle Stretches Slant Board Stretch: 3 reps;30 seconds;Limitations Slant Board Stretch Limitations: gastroc and soleus Machines for Strengthening Cybex Leg Press: PF/DF 3PL 2sets10reps Ankle Exercises - Seated Towel Inversion/Eversion: 5 reps;Weights Towel Inversion/Eversion Weights (lbs): 3 BAPS: Level 2;Sitting;10 reps Ankle Exercises - Sidelying Ankle Inversion: Strengthening;Left;Weights Ankle Inversion Weights (lbs): 3#x20  Modalities Modalities: Ultrasound Manual Therapy Manual Therapy: Massage Massage: medial aspect of foot/post tib x 5' Ultrasound Ultrasound Location: distal AT x 8' Ultrasound Parameters: 1.4 w/cm2 x 8'  Physical Therapy Assessment and Plan PT Assessment and Plan Clinical Impression Statement: Pt requires multimodal cueing to avoid knee compensation. Began BAPS L2 with manual assistance. Pt's pain went up to 2/10 during exercise. Pt's pain decreased to 1/10 at end of session.  PT Plan: Continue to progress per PT POC.     Problem List Patient Active Problem List  Diagnoses  . History of breast cancer, bilateral  . Pain in joint, ankle and foot    PT - End of Session Activity Tolerance: Patient tolerated treatment well General Behavior During Session: [facility] for tasks performed Cognition: [facility] for tasks performed  Antonieta Iba 10/29/2011, 4:21 PM

## 2011-10-31 ENCOUNTER — Ambulatory Visit (HOSPITAL_COMMUNITY)
Admission: RE | Admit: 2011-10-31 | Discharge: 2011-10-31 | Disposition: A | Discharge status: Home / Self Care | Payer: Medicare Other | Source: Ambulatory Visit | Attending: Internal Medicine | Admitting: Internal Medicine

## 2011-10-31 DIAGNOSIS — M25579 Pain in unspecified ankle and joints of unspecified foot: Secondary | ICD-10-CM

## 2011-10-31 NOTE — Progress Notes (Signed)
Physical Therapy Treatment Patient Details  Name: Heidi Lopez MRN: [id number] Date of Birth: 05-12-1946  Today's Date: 10/31/2011 Time: 1308-6578 Time Calculation (min): 52 min Visit#: 7  of 12   Re-eval: 11/15/11  Charge: therex 38 min [place] 8 min Manual 5 min  Subjective: Symptoms/Limitations Symptoms: Pt stated she has not been on feet much today so no current pain.  Pain increases to 4-5/10 when standing/walking.  Pt reported bruising where she has been getting massaged at, concerned  that she may have a blood clot in leg, leg assessed no redness, swelling or pain in L LE.  Pain Assessment Currently in Pain?: No/denies  Objective:   Exercise/Treatments Ankle Stretches Slant Board Stretch: 3 reps;30 seconds;Limitations Slant Board Stretch Limitations: gastroc and soleus Machines for Strengthening Cybex Leg Press: PF/DF 3PL 2sets15reps Ankle Exercises - Seated Towel Inversion/Eversion: 5 reps;Weights Towel Inversion/Eversion Weights (lbs): 4 BAPS: Level 2;Sitting;10 reps Ankle Exercises - Sidelying Ankle Inversion: 15 reps;Weights Ankle Inversion Weights (lbs): 4# Ankle Eversion: 15 reps;Weights Ankle Eversion Weights (lbs): 4# Modalities Modalities: Ultrasound Manual Therapy Manual Therapy: Massage Massage: medial aspect of foot/post tib x 5' Ultrasound Ultrasound Location: distal AT x 8'  Ultrasound Parameters: 1.4 w/cm2 x 8'  Ultrasound Goals: Pain  Physical Therapy Assessment and Plan PT Assessment and Plan Clinical Impression Statement: Pt required manual assistance to reduce compensation with ankle motions.  Pt stated pain increase to 1-2/10 during activity pain resolved at end of session.  PT Plan: Progress strength, ROM, begin SLS/vector stance for increased stability.    Goals    Problem List Patient Active Problem List  Diagnoses  . History of breast cancer, bilateral  . Pain in joint, ankle and foot    PT - End of Session Activity  Tolerance: Patient tolerated treatment well General Behavior During Session: [facility] for tasks performed Cognition: [facility] for tasks performed  Juel Burrow 10/31/2011, 5:17 PM

## 2011-11-02 ENCOUNTER — Ambulatory Visit (HOSPITAL_COMMUNITY)
Admission: RE | Admit: 2011-11-02 | Discharge: 2011-11-02 | Disposition: A | Discharge status: Home / Self Care | Payer: Medicare Other | Source: Ambulatory Visit | Attending: Internal Medicine | Admitting: Internal Medicine

## 2011-11-02 DIAGNOSIS — M25579 Pain in unspecified ankle and joints of unspecified foot: Secondary | ICD-10-CM

## 2011-11-02 NOTE — Progress Notes (Signed)
Physical Therapy Treatment Patient Details  Name: Heidi Lopez MRN: [id number] Date of Birth: 01-12-1946  Today's Date: 11/02/2011 Time: 8657-8469 Time Calculation (min): 51 min Visit#: 8  of 12   Re-eval: 11/15/11  Charge: therex 38 min [place] 8 min Massage 5 min  Subjective: Symptoms/Limitations Symptoms: "I can tell I am getting stronger" min pain maybe a 1-2/10. Pain Assessment Currently in Pain?: Yes Pain Score:   1 Pain Location: Foot Pain Orientation: Left  Objective:   Exercise/Treatments Ankle Stretches Plantar Fascia Stretch: 3 reps;30 seconds Slant Board Stretch: 3 reps;30 seconds;Limitations Slant Board Stretch Limitations: gastroc and soleus Machines for Strengthening Cybex Leg Press: PF/DF 4PL 2sets15reps Ankle Exercises - Standing Vector Stance: 2 reps;5 seconds;Limitations Vector Stance Limitations: 1 HHA SLS: 3x 30", 1x 2 fingers, x 1 finger, 1x no HHA 6" max  Ankle Exercises - Seated Towel Inversion/Eversion: 5 reps;Weights Towel Inversion/Eversion Weights (lbs): 5 BAPS: Level 3;10 reps Other Seated Ankle Exercises: Inversion with legs crossed 20 reps 5#    Modalities Modalities: Ultrasound Manual Therapy Manual Therapy: Massage Massage: medial aspect of foot, post tib x 8' Ultrasound Ultrasound Location: distal AT x 8'  Ultrasound Parameters: 1.4 w/cm2 x 5' Ultrasound Goals: Pain  Physical Therapy Assessment and Plan PT Assessment and Plan Clinical Impression Statement: Progressed to standing balance activity to improve ankle proprioception.  Pt tolerated well but did state pain increase to 2/10 with SLS no HHA.  Pt stated she is feeling stronger and able to complete more at home but does want to begin walking again.  Pain resolved at end of session following US/ massage. PT Plan: Continue progressing per PT POC.    Goals    Problem List Patient Active Problem List  Diagnoses  . History of breast cancer, bilateral  . Pain in joint,  ankle and foot    PT - End of Session Activity Tolerance: Patient tolerated treatment well General Behavior During Session: [facility] for tasks performed Cognition: [facility] for tasks performed  Juel Burrow 11/02/2011, 4:41 PM

## 2011-11-05 ENCOUNTER — Ambulatory Visit (HOSPITAL_COMMUNITY)
Admission: RE | Admit: 2011-11-05 | Discharge: 2011-11-05 | Disposition: A | Discharge status: Home / Self Care | Payer: Medicare Other | Source: Ambulatory Visit | Attending: Internal Medicine | Admitting: Internal Medicine

## 2011-11-05 NOTE — Progress Notes (Signed)
Physical Therapy Treatment Patient Details  Name: Heidi Lopez MRN: [id number] Date of Birth: 11-Jul-1946  Today's Date: 11/05/2011 Time: 4098-1191 Time Calculation (min): 59 min Visit#: 9  of 12   Re-eval: 11/15/11 Charges: therex 36', ultrasound 8', massage 8'    Subjective: Symptoms/Limitations Symptoms: Pt. reports she thinks she may have over done it last visit; used too high weights.  Currently 3/10; was a 5/10 following last visit. Pain Assessment Currently in Pain?: Yes Pain Score:   3 Pain Location: Foot Pain Orientation: Left   Exercise/Treatments Ankle Stretches Plantar Fascia Stretch: 3 reps;30 seconds Slant Board Stretch: 3 reps;30 seconds;Limitations Slant Board Stretch Limitations: gastroc and soleus Machines for Strengthening Cybex Leg Press: PF/DF 4PL 2sets15reps Ankle Exercises - Standing Vector Stance: 5 reps;5 seconds;Left Vector Stance Limitations: 1 HHA SLS: max of 5 trials 12" L LE only, no UE's Ankle Exercises - Seated Towel Inversion/Eversion: 5 reps Towel Inversion/Eversion Weights (lbs): 5 BAPS: Level 3;10 reps Other Seated Ankle Exercises: Inversion with legs crossed 10 reps 5#   Modalities Modalities: Ultrasound Manual Therapy Manual Therapy: Massage Massage: Medial foot/arch up to malleoli X 8' Ultrasound Ultrasound Location: medial arch to malleoli X 8' Ultrasound Parameters: 1.4w/cm2 with 3 MHz Ultrasound Goals: Pain  Physical Therapy Assessment and Plan PT Assessment and Plan Clinical Impression Statement: Pt. able to complete all exercises and decreased reps of seated inversion keeping 5# weight.  Able to maintain L SLS for 12 seconds without use of UE's.  PT Plan: Continue POC     Problem List Patient Active Problem List  Diagnoses  . History of breast cancer, bilateral  . Pain in joint, ankle and foot    PT - End of Session Activity Tolerance: Patient tolerated treatment well General Behavior During Session: Heidi Lopez for  tasks performed Cognition: [facility] for tasks performed  Heidi Lopez B. Heidi Lopez, PTA 11/05/2011, 4:42 PM

## 2011-11-07 ENCOUNTER — Ambulatory Visit (HOSPITAL_COMMUNITY)
Admission: RE | Admit: 2011-11-07 | Discharge: 2011-11-07 | Disposition: A | Discharge status: Home / Self Care | Payer: Medicare Other | Source: Ambulatory Visit | Attending: Internal Medicine | Admitting: Internal Medicine

## 2011-11-07 NOTE — Progress Notes (Signed)
Physical Therapy Treatment Patient Details  Name: Heidi Lopez MRN: [id number] Date of Birth: 10-10-1945  Today's Date: 11/07/2011 Time: 4098-1191 Tx started by Emeline Gins, PTA Time Calculation (min): 47 min Visit#: 10  of 12   Re-eval: 11/15/11 Charges: [place] x 8' Manual x 8' Therex x 27'  Subjective: Symptoms/Limitations Symptoms: Pt. states it still hurt after last visit and didn't change anything.  Pt. currently without pain but got up to a 3/10 yesterday.  Pain Assessment Currently in Pain?: No/denies   Exercise/Treatments Ankle Stretches Plantar Fascia Stretch: 3 reps;30 seconds Slant Board Stretch: 3 reps;30 seconds;Limitations Slant Board Stretch Limitations: gastroc and soleus Machines for Strengthening Cybex Leg Press: PF/DF 3PL 2sets15reps Ankle Exercises - Standing Vector Stance: 5 reps;5 seconds;Left Vector Stance Limitations: 1 HHA SLS: 3X30" Ankle Exercises - Seated Towel Inversion/Eversion: 5 reps Towel Inversion/Eversion Weights (lbs): 4 BAPS: Level 3;10 reps Other Seated Ankle Exercises: Inversion with legs crossed 10 reps 4# Modalities Modalities: Ultrasound Manual Therapy Manual Therapy: Massage Massage: Posterior tib (l) X 8' Ultrasound Ultrasound Location: medial arch to malleoli X 8'  Ultrasound Parameters: 1.4w/cm2 with 3 MHz  Ultrasound Goals: Pain  Physical Therapy Assessment and Plan PT Assessment and Plan Clinical Impression Statement: Exercises intensity decreased this session secondary to continued soreness. Pt responds well to tx. Pt reports 0/10 pain at end of session.  PT Plan: Continue to progress per PT POC.     Problem List Patient Active Problem List  Diagnoses  . History of breast cancer, bilateral  . Pain in joint, ankle and foot    PT - End of Session Activity Tolerance: Patient tolerated treatment well General Behavior During Session: [facility] for tasks performed Cognition: [facility] for tasks performed    Antonieta Iba 11/07/2011, 4:58 PM

## 2011-11-09 ENCOUNTER — Ambulatory Visit (HOSPITAL_COMMUNITY): Payer: Medicare Other | Admitting: Oncology

## 2011-11-09 ENCOUNTER — Ambulatory Visit (HOSPITAL_COMMUNITY)
Admission: RE | Admit: 2011-11-09 | Discharge: 2011-11-09 | Disposition: A | Discharge status: Home / Self Care | Payer: Medicare Other | Source: Ambulatory Visit | Attending: Physical Therapy | Admitting: Physical Therapy

## 2011-11-09 DIAGNOSIS — M25579 Pain in unspecified ankle and joints of unspecified foot: Secondary | ICD-10-CM

## 2011-11-09 NOTE — Evaluation (Signed)
Physical Therapy Evaluation  Patient Details  Name: Heidi Lopez MRN: [id number] Date of Birth: 04-26-1946  Today's Date: 11/09/2011 Time: 3086-5784 Time Calculation (min): 23 min  Visit#: 11  of 12   Re-eval: 11/09/11 Assessment Diagnosis: tendonitis Next MD Visit: 11/10/11  Past Medical History:  Past Medical History  Diagnosis Date  . Cancer of breast July 2010  . Glaucoma   . Cramps, muscle, general     severe breast cramping   . Heart palpitations   . Night sweats   . Pain     breast pain  . Chronic coughing   . Sinus problem   . Wears glasses   . Glaucoma     left eye   . Joint pain     foot and knee pain   Past Surgical History:  Past Surgical History  Procedure Date  . Breast surgery 2010    for cancer  . Tubal ligation 1984    Subjective Symptoms/Limitations Symptoms: Heidi Lopez states that her ankle is better.  She states that she feels 80% better. How long can you sit comfortably?: sitting is no problem How long can you stand comfortably?: The patient states that she is able to stand for about fifteen minutes now before she feels the pain where she was feeling the pain immediately. How long can you walk comfortably?: The patient states that she walking with her cast boot if she is going to be up on her leg for a long time.  The patient states that she is able to walk for thirty minutes before it is really hurting where before she was having pain immediately. Special Tests: The patient states that she is no longer waking up due to the pain.  The patient was waking up two times a night. Pain Assessment Currently in Pain?: Yes (highest pain in the past week has been 5/10 with ex; 3/10) Pain Location: Ankle Pain Orientation: Left Pain Onset: More than a month ago Pain Frequency: Intermittent   Assessment LLE AROM (degrees) Left Ankle Dorsiflexion 0-20: 20  Left Ankle Plantar Flexion 0-45: 45  Left Ankle Inversion: 35  Left Ankle Eversion: 15  LLE  Strength Left Ankle Dorsiflexion: 5/5 Left Ankle Plantar Flexion: 5/5 Left Ankle Inversion: 5/5 Left Ankle Eversion: 5/5    Physical Therapy Assessment and Plan PT Assessment and Plan Clinical Impression Statement: Pt states she is I in her exercise program.  Pt states she is 80% better.  No longer has a need for skilled therapy Rehab Potential: Good PT Plan: Discharge patient.    Goals Home Exercise Program PT Goal: Perform Home Exercise Program - Progress: Met PT Short Term Goals PT Short Term Goal 1 - Progress: Met PT Long Term Goals PT Long Term Goal 1 - Progress: Met PT Long Term Goal 2 - Progress: Met Long Term Goal 3 Progress: Met Long Term Goal 4 Progress: Progressing toward goal  Problem List Patient Active Problem List  Diagnoses  . History of breast cancer, bilateral  . Pain in joint, ankle and foot    PT - End of Session Activity Tolerance: Patient tolerated treatment well General Behavior During Session: [facility] for tasks performed Cognition: [facility] for tasks performed    Leatha Rohner,CINDY 11/09/2011, 3:35 PM  Physician Documentation Your signature is required to indicate approval of the treatment plan as stated above.  Please sign and either send electronically or make a copy of this report for your files and return this physician signed original.  Please mark one 1.__approve of plan  2. ___approve of plan with the following conditions.   ______________________________                                                          _____________________ Physician Signature                                                                                                             Date

## 2011-11-09 NOTE — Patient Instructions (Addendum)
HEP

## 2011-11-16 ENCOUNTER — Encounter (INDEPENDENT_AMBULATORY_CARE_PROVIDER_SITE_OTHER): Payer: Self-pay | Admitting: Surgery

## 2011-11-16 ENCOUNTER — Ambulatory Visit (INDEPENDENT_AMBULATORY_CARE_PROVIDER_SITE_OTHER): Payer: Medicare Other | Admitting: Surgery

## 2011-11-16 VITALS — BP 140/80 | HR 70 | Temp 97.8°F | Resp 18 | Ht 64.5 in | Wt 188.0 lb

## 2011-11-16 DIAGNOSIS — Z853 Personal history of malignant neoplasm of breast: Secondary | ICD-10-CM

## 2011-11-16 NOTE — Progress Notes (Signed)
[facility]  Ovidio Kin, MD,  FACS [address] [place]  [address] [place], [place]    [address] Phone:  [phone] FAX:  [phone]   Re:   EDWARD TREVINO DOB:   06/06/1946 MRN:   [id number]  ASSESSMENT AND PLAN: 1. T1, N0 left breast cancer. 2011.8.   Disease free.   ? Reason for left breast/chest "spasms"  She still has these.  On tamoxifen.  To see me back in 6 months.  2 T1, N0 Right breast cancer 2010.7   3. Tamoxifen   Mild hot flashes.  Doing okay.  4. Interstitial cystitis.  She's taking cranberry pills and stopped caffiene. Things are better. 5.  Feels occasional palpitations.  I suggested that she contact Dr. [name] if they are persistent. 6.  Left leg tendonitis.   HISTORY OF PRESENT ILLNESS: Chief Complaint  Patient presents with  . Breast Cancer Long Term Follow Up    6 month     Heidi Lopez is a 66 y.o. (DOB: 1946/09/09)  white female who is a patient of FAGAN,ROY, MD, MD and comes to me today for follow up of bilateral breast cancer.  She is doing well.  She has some left leg tendinitis that is bothering her.  She still has some spasms of her left breast and chest.  It is unclear what is causingd these.  She also talked about having heart palpitations, which did no toccur until after the radiation therapy.  I encouraged her to bring this to Dr. Alonza Smoker attention.  Path (side, TNM):  Invasive lobular - Right, T1, N0 Surgery:     Right lumpectomy  Date: 04/14/2009  Size of tumor: 0.6 cm (right)  Nodes: 0/1 (RIGHT)  (Right) ER: 96% PR: 33% Ki67: 80% HER2Neu: neg   Path (side, TNM):  Left, T1, N0 Surgery: Left lumpectomy      Date: 04/26/10  Size of tumor: 1.7 cm (left)  Nodes: 0/4 (Left)  (Left) ER: 9% PR: 96% Ki67: 9 HER2Neu: neg    Medical Oncologist: Neijstrom   Radiation Oncologist: Michell Heinrich  Note:  Her sister is Heidi Lopez (MR# [id number]) whom I am seeing for an advanced left breast cancer.  PHYSICAL EXAM: BP 140/80   Pulse 70  Temp(Src) 97.8 F (36.6 C) (Temporal)  Resp 18  Ht 5' 4.5" (1.638 m)  Wt 188 lb (85.276 kg)  BMI 31.77 kg/m2  HEENT:  Pupils equal.  Dentition good.  No injury. NECK:  Supple.  No thyroid mass. LYMPH NODES:  No cervical, supraclavicular, or axillary adenopathy. BREASTS -  RIGHT:  Scar at 12 o'clock that has some lumpiness under it..  No nipple discharge.   LEFT:  Scar at 1 o'clock, which is healed better than the right side.  But the left side bothers her more.  No palpable mass or nodule.  No nipple discharge.  The pigmentation of the two breasts are symmetric now. UPPER EXTREMITIES:  No evidence of lymphedema.  DATA REVIEWED: Last mammogram last 04/10/2012.  She is up for another mammogram this summer.  Ovidio Kin, MD, FACS Office:  [phone]

## 2011-11-19 ENCOUNTER — Encounter (HOSPITAL_COMMUNITY): Payer: Medicare Other | Attending: Oncology | Admitting: Oncology

## 2011-11-19 VITALS — BP 136/81 | HR 80 | Wt 188.3 lb

## 2011-11-19 DIAGNOSIS — C50919 Malignant neoplasm of unspecified site of unspecified female breast: Secondary | ICD-10-CM | POA: Insufficient documentation

## 2011-11-19 DIAGNOSIS — Z9010 Acquired absence of unspecified breast and nipple: Secondary | ICD-10-CM

## 2011-11-19 DIAGNOSIS — E669 Obesity, unspecified: Secondary | ICD-10-CM | POA: Insufficient documentation

## 2011-11-19 DIAGNOSIS — Z853 Personal history of malignant neoplasm of breast: Secondary | ICD-10-CM

## 2011-11-19 DIAGNOSIS — M779 Enthesopathy, unspecified: Secondary | ICD-10-CM | POA: Insufficient documentation

## 2011-11-19 LAB — DIFFERENTIAL
Basophils Absolute: 0 10*3/uL (ref 0.0–0.1)
Basophils Relative: 1 % (ref 0–1)
Eosinophils Absolute: 0.1 10*3/uL (ref 0.0–0.7)
Eosinophils Relative: 2 % (ref 0–5)

## 2011-11-19 LAB — COMPREHENSIVE METABOLIC PANEL
ALT: 51 U/L — ABNORMAL HIGH (ref 0–35)
AST: 32 U/L (ref 0–37)
Calcium: 9.7 mg/dL (ref 8.4–10.5)
Creatinine, Ser: 0.69 mg/dL (ref 0.50–1.10)
GFR calc Af Amer: >90 mL/min (ref >90)
Sodium: 136 mEq/L (ref 135–145)
Total Protein: 7.2 g/dL (ref 6.0–8.3)

## 2011-11-19 LAB — CBC
MCH: 31.1 pg (ref 26.0–34.0)
MCHC: 34.3 g/dL (ref 30.0–36.0)
MCV: 90.6 fL (ref 78.0–100.0)
Platelets: 204 10*3/uL (ref 150–400)
RDW: 12.7 % (ref 11.5–15.5)
WBC: 6.6 10*3/uL (ref 4.0–10.5)

## 2011-11-19 NOTE — Progress Notes (Signed)
CC:   [name]. [name], MD [name]. [name], M.D. [name], M.D.  DIAGNOSES: 1. 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  Ki-67     marker low at 9%, HER-2/neu negative.  She is status post     lumpectomy followed by radiation therapy, with surgery on     04/26/2010 and then she started tamoxifen on 08/16/2010 after     radiation finished on 07/21/2010. 2. 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  Sentinel node     was negative.  ER receptors 96%.  PR receptors 33%, Ki-67 marker     low at 8%, HER-2/neu negative, and she did not take any hormonal     therapy after the radiation on the right. 3. Left breast cyst removal 20+ years ago by Dr. Francina Ames. 4. Mild obesity weighing 189 to 188 pounds these days. 5. Tendonitis in the left median instep after collapse of her left     arch, seeing Dr. Lestine Box.  Heidi Lopez is doing very well with the hormonal therapy which she is taking.  She has tolerated it very well.She has no side effects that she is aware of.  Oncology ROS is negative.  PHYSICAL EXAMINATION:  Stable.  No lumps in either breast.  She has minimal surgical changes and minimal radiation changes.  No adenopathy. Lungs are clear.  Heart shows a regular rhythm and rate.  Abdomen soft, nontender but obese.  She has normal bowel sounds.  No peripheral edema.  So she is doing well.  She has little chest spasms at times, little palpitations but nothing that she does not think she can handle.  The spasms are infrequent, palpitations are rare.  So we will see her back in 6 months.  She will continue the drug for a total of 5 years.    ______________________________ Ladona Horns.  Mariel Sleet, MD ESN/MEDQ  D:  11/19/2011  T:  11/19/2011  Job:  [id number]

## 2011-11-19 NOTE — Progress Notes (Signed)
This office note has been dictated.

## 2011-11-19 NOTE — Patient Instructions (Signed)
[facility]  Discharge Instructions  RECOMMENDATIONS MADE BY THE CONSULTANT AND ANY TEST RESULTS WILL BE SENT TO YOUR REFERRING DOCTOR.   EXAM FINDINGS BY MD TODAY AND SIGNS AND SYMPTOMS TO REPORT TO CLINIC OR PRIMARY MD:  Exam per Dr. Mariel Sleet    INSTRUCTIONS GIVEN AND DISCUSSED: We will do basic labs today  SPECIAL INSTRUCTIONS/FOLLOW-UP: Exam in 6 months   I acknowledge that I have been informed and understand all the instructions given to me and received a copy. I do not have any more questions at this time, but understand that I may call the Specialty Clinic at [facility] at [phone] during business hours should I have any further questions or need assistance in obtaining follow-up care.    __________________________________________  _____________  __________ Signature of Patient or Authorized Representative            Date                   Time    __________________________________________ Nurse's Signature

## 2012-04-16 ENCOUNTER — Ambulatory Visit (HOSPITAL_COMMUNITY)
Admission: RE | Admit: 2012-04-16 | Discharge: 2012-04-16 | Disposition: A | Discharge status: Home / Self Care | Payer: Medicare Other | Source: Ambulatory Visit | Attending: Oncology | Admitting: Oncology

## 2012-04-16 DIAGNOSIS — Z853 Personal history of malignant neoplasm of breast: Secondary | ICD-10-CM | POA: Insufficient documentation

## 2012-04-16 DIAGNOSIS — Z9889 Other specified postprocedural states: Secondary | ICD-10-CM

## 2012-05-21 ENCOUNTER — Encounter (INDEPENDENT_AMBULATORY_CARE_PROVIDER_SITE_OTHER): Payer: Self-pay | Admitting: Surgery

## 2012-05-21 ENCOUNTER — Ambulatory Visit (INDEPENDENT_AMBULATORY_CARE_PROVIDER_SITE_OTHER): Payer: Medicare Other | Admitting: Surgery

## 2012-05-21 VITALS — BP 126/76 | HR 80 | Temp 99.4°F | Resp 18 | Ht 64.0 in | Wt 188.6 lb

## 2012-05-21 DIAGNOSIS — Z853 Personal history of malignant neoplasm of breast: Secondary | ICD-10-CM

## 2012-05-21 NOTE — Progress Notes (Signed)
[facility]  Ovidio Kin, MD,  FACS [address] [place]  [address] [place], [place]    [address] Phone:  [phone] FAX:  [phone]   Re:   Heidi Lopez DOB:   August 19, 1946 MRN:   [id number]  ASSESSMENT AND PLAN: 1. T1, N0 left breast cancer. 2011.8.   Disease free.   On tamoxifen.  She gets hot flashes with the tamoxifen.  To see me back in 6 months.  2 T1, N0 Right breast cancer 2010.7   3. Tamoxifen   Mild hot flashes.  Doing okay. 4. Interstitial cystitis.   She's taking cranberry pills and stopped caffiene. Things are better. 5.  Feels occasional palpitations.   She's taking Q10 and baby aspirin and these are better. 6.  Left leg tendonitis. 7.  Getting over shingles involving her left posterior neck.  HISTORY OF PRESENT ILLNESS: Chief Complaint  Patient presents with  . Follow-up    LTF    Heidi Lopez is a 66 y.o. (DOB: 02/28/1946)  white female who is a patient of FAGAN,ROY, MD and comes to me today for follow up of bilateral breast cancer.  She is doing well.  The main thing that we talked about was her recent shingles, which she is getting over.  She did get the vaccine, but got the shingles anyway.  Path (side, TNM):  Invasive lobular - Right, T1, N0 Surgery:     Right lumpectomy  Date: 04/14/2009  Size of tumor: 0.6 cm (right)  Nodes: 0/1 (RIGHT)  (Right) ER: 96% PR: 33% Ki67: 80% HER2Neu: neg   Path (side, TNM):  Left, T1, N0 Surgery: Left lumpectomy      Date: 04/26/10  Size of tumor: 1.7 cm (left)  Nodes: 0/4 (Left)  (Left) ER: 9% PR: 96% Ki67: 9 HER2Neu: neg    Medical Oncologist: Neijstrom   Radiation Oncologist: Michell Heinrich  Social history:  Her sister is Heidi Lopez (MR# [id number]) whom I am seeing for an advanced left breast cancer.  She is doing well.  PHYSICAL EXAM: BP 126/76  Pulse 80  Temp 99.4 F (37.4 C) (Temporal)  Resp 18  Ht 5\' 4"  (1.626 m)  Wt 188 lb 9.6 oz (85.548 kg)  BMI 32.37 kg/m2  SpO2  98%  HEENT:  Pupils equal.  Dentition good.  No injury.  She had adenopathy from the shingles in the posterior left cervical chain, but this has resolved. NECK:  Supple.  No thyroid mass. LYMPH NODES:  No cervical, supraclavicular, or axillary adenopathy. BREASTS -  RIGHT:  Scar at 12 o'clock that has some lumpiness under it..  No nipple discharge.   LEFT:  Scar at 1 o'clock, which is healed better than the right side.   No palpable mass or nodule.  No nipple discharge.   UPPER EXTREMITIES:  No evidence of lymphedema.  DATA REVIEWED: Last mammogram last 04/16/2012.  She got a lot of bruising and is thinking about [facility].  Ovidio Kin, MD, FACS Office:  [phone]

## 2012-05-26 ENCOUNTER — Encounter (HOSPITAL_COMMUNITY): Payer: Medicare Other

## 2012-05-26 ENCOUNTER — Encounter (HOSPITAL_COMMUNITY): Payer: Self-pay

## 2012-05-26 VITALS — BP 119/64 | HR 69 | Temp 98.0°F | Resp 18 | Ht 64.0 in | Wt 190.5 lb

## 2012-06-24 ENCOUNTER — Encounter (HOSPITAL_COMMUNITY): Payer: Medicare Other | Attending: Oncology | Admitting: Oncology

## 2012-06-24 VITALS — BP 126/68 | HR 74 | Temp 97.7°F | Resp 18 | Wt 184.2 lb

## 2012-06-24 DIAGNOSIS — R61 Generalized hyperhidrosis: Secondary | ICD-10-CM

## 2012-06-24 DIAGNOSIS — C50419 Malignant neoplasm of upper-outer quadrant of unspecified female breast: Secondary | ICD-10-CM

## 2012-06-24 DIAGNOSIS — C50919 Malignant neoplasm of unspecified site of unspecified female breast: Secondary | ICD-10-CM

## 2012-06-24 DIAGNOSIS — Z853 Personal history of malignant neoplasm of breast: Secondary | ICD-10-CM

## 2012-06-24 MED ORDER — EXEMESTANE 25 MG PO TABS: 25.0000 mg | ORAL_TABLET | Freq: Every day | ORAL | Status: DC

## 2012-06-24 NOTE — Patient Instructions (Addendum)
[facility]  Discharge Instructions Heidi Lopez  DOB 01/01/1946 CSN [id number]  MRN [id number] Dr. Glenford Peers    RECOMMENDATIONS MADE BY THE CONSULTANT AND ANY TEST RESULTS WILL BE SENT TO YOUR REFERRING DOCTOR.   EXAM FINDINGS BY MD TODAY AND SIGNS AND SYMPTOMS TO REPORT TO CLINIC OR PRIMARY MD:   Please finish your 5 tablets of Tamoxifen. Then discontinue.  Start Aromasin in 2 weeks. Hopefully, this will help decrease hot flashes.  3D Imaging of the Breast can results in a better picture because they take pictures a little bit differently than with a standard mammography however the compression is the same.   Bone Density screening  Return in 6 months     I acknowledge that I have been informed and understand all the instructions given to me and received a copy. I do not have any more questions at this time, but understand that I may call the Specialty Clinic at [facility] at [phone] during business hours should I have any further questions or need assistance in obtaining follow-up care.    __________________________________________  _____________  __________ Signature of Patient or Authorized Representative            Date                   Time    __________________________________________ Nurse's Signature

## 2012-06-24 NOTE — Progress Notes (Signed)
Diagnosis #1 invasive ductal carcinoma the left breast 1.7 cm in size with LV I. but for negative sentinel nodes. His receptor +95% progesterone receptor +96% Ki-67 marker low at 9% HER-2/neu not amplified status post lumpectomy followed by radiation therapy. Her surgery was 04/26/2010 and she started tamoxifen on 08/16/2010 after radiation therapy finished on 07/21/2010. She is having tremendous sweating and hot flashes now which have gotten clearly worse in the last 6 months she states would like to try switching her medications. Diagnosis #2 right sided invasive lobular carcinoma with associated LCIS and DCIS stage IB (T1 B. N0) with surgery on 05/09/2009 with a lumpectomy and negative sentinel node biopsy followed radiation therapy. As receptors were 96% positive progesterone separate 33% positive Ki-67 marker low 80% HER-2/neu negative and she did not receive any hormonal therapy after the radiation on the right side. Diagnosis #3 obesity but she has lost about 5 pounds and is trying to change your lifestyle Diagnosis number #4 tendinitis in the left instep followed by her foot Dr.   The patient has not had a bone density in several years so we need to set that up. She wants to switch the tamoxifen to another drug which leaves [place] one of the 3 aromatase inhibitors. I think Aromasin may well be the best tolerated so we will try that drug first. She is doing well from her review of systems other than sweating and hot flashes are overwhelming her she states urine she cannot work outside without sweat pouring down into her eyes not allowing her to see well.  She also had bruises on both breasts after her mammography wants [place] to look into the 3-D mammography to see that has less pressure associated with that but she was on aspirin during the mammography and that may explain the easy bruisability.  Her vital signs are otherwise recorded and stable. Lymph nodes are negative throughout. Both breasts are  negative for masses but remain tender to palpation. Her lungs are clear. Heart shows a regular rhythm and rate without murmur rub or gallop. She does have a 3-4 cm ecchymosis right upper back which she does not know how it came about. Abdomen is soft and nontender without organomegaly but decrease bowel sounds. She has no peripheral edema. The plan is to get a bone density change her from tamoxifen to Aromasin, she will call some month after she starts the Aromasin. She will have a 2 week break from the tamoxifen to the Aromasin. She was at higher than normal risk for uterine cancer because of the tamoxifen and therefore needed a yearly physical exam and Pap smear and since she has had tamoxifen for several years. I still need that for a couple more years.

## 2012-06-30 ENCOUNTER — Ambulatory Visit (HOSPITAL_COMMUNITY)
Admission: RE | Admit: 2012-06-30 | Discharge: 2012-06-30 | Disposition: A | Discharge status: Home / Self Care | Payer: Medicare Other | Source: Ambulatory Visit | Attending: Oncology | Admitting: Oncology

## 2012-06-30 DIAGNOSIS — Z853 Personal history of malignant neoplasm of breast: Secondary | ICD-10-CM

## 2012-06-30 DIAGNOSIS — Z780 Asymptomatic menopausal state: Secondary | ICD-10-CM | POA: Insufficient documentation

## 2012-07-14 ENCOUNTER — Telehealth (HOSPITAL_COMMUNITY): Payer: Self-pay

## 2012-07-14 NOTE — Telephone Encounter (Signed)
Per request of Dr. Mariel Sleet - patient to take calcium 600 mg and Vitamin D 1000 to 2000 units daily.  Patient will check what she's taking and will make sure she's taking these amounts.

## 2012-07-29 ENCOUNTER — Encounter: Payer: Self-pay | Admitting: Oncology

## 2012-08-13 ENCOUNTER — Telehealth (HOSPITAL_COMMUNITY): Payer: Self-pay | Admitting: Oncology

## 2012-08-13 DIAGNOSIS — Z853 Personal history of malignant neoplasm of breast: Secondary | ICD-10-CM

## 2012-08-13 MED ORDER — TAMOXIFEN CITRATE 20 MG PO TABS: 20.0000 mg | ORAL_TABLET | Freq: Every day | ORAL | Status: DC

## 2012-08-13 NOTE — Telephone Encounter (Signed)
Patient wished to finish current supply of aromasin before re-starting tamoxifen.  Discussed with PA and is to be off aromasin for 2 weeks before re-starting tamoxifen.  Patient verbalized understanding of instructions.

## 2012-08-13 NOTE — Telephone Encounter (Signed)
Message copied by Evelena Leyden on Wed Aug 13, 2012  1:48 PM ------      Message from: Mariel Sleet, ERIC S      Created: Wed Aug 13, 2012 11:01 AM       Can go back on Tamoxifen      Call in 6 mo supply wherever she wants

## 2012-12-23 ENCOUNTER — Encounter (HOSPITAL_COMMUNITY): Payer: Self-pay | Admitting: Oncology

## 2012-12-23 ENCOUNTER — Encounter (HOSPITAL_COMMUNITY): Payer: Medicare Other | Attending: Oncology | Admitting: Oncology

## 2012-12-23 VITALS — BP 137/82 | HR 76 | Temp 98.0°F | Resp 18 | Wt 192.3 lb

## 2012-12-23 DIAGNOSIS — C50419 Malignant neoplasm of upper-outer quadrant of unspecified female breast: Secondary | ICD-10-CM

## 2012-12-23 DIAGNOSIS — Z853 Personal history of malignant neoplasm of breast: Secondary | ICD-10-CM | POA: Insufficient documentation

## 2012-12-23 DIAGNOSIS — Z09 Encounter for follow-up examination after completed treatment for conditions other than malignant neoplasm: Secondary | ICD-10-CM | POA: Insufficient documentation

## 2012-12-23 DIAGNOSIS — Z170 Estrogen receptor positive status [ER+]: Secondary | ICD-10-CM

## 2012-12-23 DIAGNOSIS — C50919 Malignant neoplasm of unspecified site of unspecified female breast: Secondary | ICD-10-CM

## 2012-12-23 LAB — CBC WITH DIFFERENTIAL/PLATELET
Basophils Absolute: 0 10*3/uL (ref 0.0–0.1)
Eosinophils Relative: 2 % (ref 0–5)
Lymphocytes Relative: 26 % (ref 12–46)
MCV: 90.3 fL (ref 78.0–100.0)
Neutro Abs: 4.7 10*3/uL (ref 1.7–7.7)
Platelets: 200 10*3/uL (ref 150–400)
RDW: 12.6 % (ref 11.5–15.5)
WBC: 7.5 10*3/uL (ref 4.0–10.5)

## 2012-12-23 LAB — COMPREHENSIVE METABOLIC PANEL
ALT: 34 U/L (ref 0–35)
AST: 26 U/L (ref 0–37)
CO2: 27 mEq/L (ref 19–32)
Calcium: 10.1 mg/dL (ref 8.4–10.5)
GFR calc non Af Amer: 85 mL/min — ABNORMAL LOW (ref >90)
Sodium: 137 mEq/L (ref 135–145)

## 2012-12-23 NOTE — Progress Notes (Signed)
Diagnosis #1 invasive ductal carcinoma the left breast, 1.7 cm in size with LV I. She had negative sentinel lymph nodes. ER +95%, PR +96%, Ki-67 marker low at 9%, HER-2/neu not amplified. She had a lumpectomy followed by radiation therapy. Her surgery was 04/26/2010. We started tamoxifen on 08/16/2010 after radiation therapy finished on 07/21/2010. We did try to switch her to Aromasin but she had more side effects. She had hot flashes that were worrisome with the tamoxifen and she never felt good while on the drug. She is now back on tamoxifen.  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 (T1 B. N0) was surgery on 05/09/2009 status post lumpectomy with sentinel node biopsy which was negative. She then had radiation therapy. ER receptors 96% positive PR receptors 33% positive Ki-67 marker low at 8%, HER-2/neu nonamplified. She did not receive hormonal therapy after the radiation therapy on the right side. Diagnosis #3 excessive weight and she continues to gain weight she is up 8 more pounds since October 2013. She states that she never we'll set aside after a meal. She is expecting to feel full and so she keeps eating. She eats many good foods, very few junk foods. She needs to change her way of life or else she will be in difficulty from the standpoint of her weight, eventually possibly diabetes, cirrhosis etc. We'll long talk about what she needs to do. She wanted to know if there is a medication that would help her feel full after a meal. I do not know of one that can help her realistically. She's also not exercising much at all anymore.  #4 tendinitis in the left instep which is better. #5 nonspecific upper quadrant abdominal discomfort after meals at times. She has not had a colonoscopy in more than 10 years so we will set this up with Dr. Patty Sermons office.  Her vital signs are recorded in her oncology review of systems is noncontributory.  She has no  lymphadenopathy. Both breasts are negative for any masses. All surgical changes are well-healed. Lungs are clear to auscultation and percussion. Skin exam is benign. Facial symmetry is intact. Heart shows a regular rhythm and rate without murmur rub or gallop. Abdomen is obese and I can feel her liver edge on deep inspiration but overall span by percussion and palpation is approximately 10-12 cm. I cannot feel her spleen tip. Bowel sounds are diminished. She has no leg edema no arm edema. She has no obvious ascites.  We will get her to Dr. Karilyn Cota, continue the tamoxifen for at least 5 years and possibly longer she so desires. We will get a baseline set of blood tests today see her back in 6 months. I was going to defer further blood work but she was insistent that we should do some. Her last blood work was last March 2013

## 2012-12-23 NOTE — Patient Instructions (Addendum)
[facility] Discharge Instructions  RECOMMENDATIONS MADE BY THE CONSULTANT AND ANY TEST RESULTS WILL BE SENT TO YOUR REFERRING PHYSICIAN.  EXAM FINDINGS BY THE PHYSICIAN TODAY AND SIGNS OR SYMPTOMS TO REPORT TO CLINIC OR PRIMARY PHYSICIAN: exam and discussion by MD.  No evidence of recurrence by exam.  We will call Dr. Karilyn Cota and get you an appointment for your colonoscopy.  MEDICATIONS PRESCRIBED:  none  INSTRUCTIONS GIVEN AND DISCUSSED: Report any new lumps, bone pain, shortness of breath or other symptoms.  SPECIAL INSTRUCTIONS/FOLLOW-UP: Follow-up in 6 months to see MD.  Thank you for choosing [facility] to provide your oncology and hematology care.  To afford each patient quality time with our providers, please arrive at least 15 minutes before your scheduled appointment time.  With your help, our goal is to use those 15 minutes to complete the necessary work-up to ensure our physicians have the information they need to help with your evaluation and healthcare recommendations.    Effective January 1st, 2014, we ask that you re-schedule your appointment with our physicians should you arrive 10 or more minutes late for your appointment.  We strive to give you quality time with our providers, and arriving late affects you and other patients whose appointments are after yours.    Again, thank you for choosing [facility].  Our hope is that these requests will decrease the amount of time that you wait before being seen by our physicians.       _____________________________________________________________  Should you have questions after your visit to [facility], please contact our office at [phone] between the hours of 8:30 a.m. and 5:00 p.m.  Voicemails left after 4:30 p.m. will not be returned until the following business day.  For prescription refill requests, have your pharmacy contact our office with your prescription  refill request.

## 2012-12-24 ENCOUNTER — Encounter (INDEPENDENT_AMBULATORY_CARE_PROVIDER_SITE_OTHER): Payer: Self-pay

## 2012-12-24 ENCOUNTER — Telehealth (INDEPENDENT_AMBULATORY_CARE_PROVIDER_SITE_OTHER): Payer: Self-pay | Admitting: *Deleted

## 2012-12-24 ENCOUNTER — Other Ambulatory Visit (INDEPENDENT_AMBULATORY_CARE_PROVIDER_SITE_OTHER): Payer: Self-pay | Admitting: *Deleted

## 2012-12-24 DIAGNOSIS — Z800 Family history of malignant neoplasm of digestive organs: Secondary | ICD-10-CM

## 2012-12-24 DIAGNOSIS — Z1211 Encounter for screening for malignant neoplasm of colon: Secondary | ICD-10-CM

## 2012-12-24 MED ORDER — PEG-KCL-NACL-NASULF-NA ASC-C 100 G PO SOLR: 1.0000 | Freq: Once | ORAL | Status: DC

## 2012-12-24 NOTE — Telephone Encounter (Signed)
Patient needs movi prep 

## 2012-12-26 ENCOUNTER — Telehealth (HOSPITAL_COMMUNITY): Payer: Self-pay

## 2012-12-26 NOTE — Telephone Encounter (Signed)
Colonoscopy scheduled for 02/05/13 with Dr. Karilyn Cota.

## 2013-01-01 ENCOUNTER — Encounter (INDEPENDENT_AMBULATORY_CARE_PROVIDER_SITE_OTHER): Payer: Self-pay | Admitting: Surgery

## 2013-01-01 ENCOUNTER — Ambulatory Visit (INDEPENDENT_AMBULATORY_CARE_PROVIDER_SITE_OTHER): Payer: Medicare Other | Admitting: Surgery

## 2013-01-01 VITALS — BP 122/68 | HR 70 | Temp 97.1°F | Resp 18 | Ht 64.0 in | Wt 186.2 lb

## 2013-01-01 DIAGNOSIS — Z853 Personal history of malignant neoplasm of breast: Secondary | ICD-10-CM

## 2013-01-01 NOTE — Progress Notes (Addendum)
[facility]  Ovidio Kin, MD,  FACS [address] [place]  [address] [place], [place]    [address] Phone:  [phone] FAX:  [phone]   Re:   Heidi Lopez   "Heidi Lopez" DOB:   10-17-1945 MRN:   [id number]  ASSESSMENT AND PLAN: 1. T1, N0 left breast cancer. 2011.8.   Disease free.   On tamoxifen.  She gets hot flashes with the tamoxifen.  To see me back in 6 months.  2 T1, N0 Right breast cancer 2010.7   3. Tamoxifen   Mild hot flashes.  Doing okay. 4. Interstitial cystitis.   She's taking cranberry pills and stopped caffiene. Things are better. 5.  Feels occasional palpitations.   She's taking Q10 and baby aspirin and these are better.  They are about the same as when I last saw her. 6.  Left leg tendonitis.  Resolved. 7.  Bilateral upper abdominal pain - mild.  No obvious source.  For colonoscopy by Dr. Karilyn Cota in about one month.  HISTORY OF PRESENT ILLNESS: Chief Complaint  Patient presents with  . Follow-up    LTF-br reck   Heidi Lopez is a 67 y.o. (DOB: Jan 23, 1946)  white female who is a patient of FAGAN,ROY, MD and comes to me today for follow up of bilateral breast cancer.  We talked about grandchildren.  She has like 17 grandchildren/great grandchildren.  She has chest spasm still - they are about the same - the Q10 seems to help.  She also has some vague RUQ and LUQ discomfort.  She is to have a colonoscopy by Rr. Rehman in about one month.  Her last colonoscopy was 16 years ago.  She is doing well.  She has no concern about her breast, though she has some mild axillary pain.  She is worried about the mammograms, because last time she had bilateral breast bruising.  Path (side, TNM):  Invasive lobular - Right, T1, N0 Surgery:     Right lumpectomy  Date: 04/14/2009  Size of tumor: 0.6 cm (right)  Nodes: 0/1 (RIGHT)  (Right) ER: 96% PR: 33% Ki67: 80% HER2Neu: neg   Path (side, TNM):  Left, T1, N0 Surgery: Left lumpectomy      Date: 04/26/10   Size of tumor: 1.7 cm (left)  Nodes: 0/4 (Left)  (Left) ER: 9% PR: 96% Ki67: 9 HER2Neu: neg    Medical Oncologist: Neijstrom   Radiation Oncologist: Michell Heinrich  Social history:  Her sister is Nada Boozer (MR# [id number]) whom I am seeing for an advanced left breast cancer.  She is doing well.  PHYSICAL EXAM: BP 122/68  Pulse 70  Temp(Src) 97.1 F (36.2 C) (Temporal)  Resp 18  Ht 5\' 4"  (1.626 m)  Wt 186 lb 3.2 oz (84.46 kg)  BMI 31.95 kg/m2  HEENT:  Pupils equal.  Dentition good.  No injury. NECK:  Supple.  No thyroid mass. LYMPH NODES:  No cervical, supraclavicular, or axillary adenopathy. BREASTS -  RIGHT:  Scar at 12 o'clock that has some lumpiness under it..  No nipple discharge.   LEFT:  Scar at 1 o'clock, which is healed better than the right side.   No palpable mass or nodule.  No nipple discharge.   UPPER EXTREMITIES:  No evidence of lymphedema. ABDOMEN:  No mass or guarding.  She points to LUQ and RUQ as area of discomfort.  DATA REVIEWED: Last mammogram last 04/16/2012.  She got a lot of bruising and is thinking about getting  [facility].  Ovidio Kin, MD, FACS Office:  [phone]

## 2013-01-06 ENCOUNTER — Telehealth (INDEPENDENT_AMBULATORY_CARE_PROVIDER_SITE_OTHER): Payer: Self-pay | Admitting: *Deleted

## 2013-01-06 NOTE — Telephone Encounter (Signed)
agree

## 2013-01-06 NOTE — Telephone Encounter (Signed)
  Procedure: tcs  Reason/Indication:  Screening, fam hx colon ca  Has patient had this procedure before?  Yes, 1998 (scanned)  If so, when, by whom and where?    Is there a family history of colon cancer?  Yes, father  Who?  What age when diagnosed?    Is patient diabetic?   no      Does patient have prosthetic heart valve?  no  Do you have a pacemaker?  no  Has patient ever had endocarditis? no  Has patient had joint replacement within last 12 months?  no  Is patient on Coumadin, Plavix and/or Aspirin? no  Medications: vit D3 1000 IU daily, CO Q 10 200 mg daily, cranberry pill 300 mg daily, multi vit daily, flax seed oil 1200 mg daily, calcium citrate plus Vit D bid, tamoxifen 20 mg daily, travatan 0.004% 1 drop each eye daily  Allergies: reglan  Medication Adjustment:   Procedure date & time: 02/05/13 at 830

## 2013-01-20 ENCOUNTER — Encounter (HOSPITAL_COMMUNITY): Payer: Self-pay | Admitting: Pharmacy Technician

## 2013-01-20 NOTE — Progress Notes (Signed)
She is scheduled for screening colonoscopy to

## 2013-02-05 ENCOUNTER — Encounter (HOSPITAL_COMMUNITY)
Admission: RE | Disposition: A | Discharge status: Home / Self Care | Payer: Self-pay | Source: Ambulatory Visit | Attending: Internal Medicine

## 2013-02-05 ENCOUNTER — Ambulatory Visit (HOSPITAL_COMMUNITY)
Admission: RE | Admit: 2013-02-05 | Discharge: 2013-02-05 | Disposition: A | Discharge status: Home / Self Care | Payer: Medicare Other | Source: Ambulatory Visit | Attending: Internal Medicine | Admitting: Internal Medicine

## 2013-02-05 ENCOUNTER — Encounter (HOSPITAL_COMMUNITY): Payer: Self-pay

## 2013-02-05 DIAGNOSIS — Z1211 Encounter for screening for malignant neoplasm of colon: Secondary | ICD-10-CM | POA: Insufficient documentation

## 2013-02-05 DIAGNOSIS — K644 Residual hemorrhoidal skin tags: Secondary | ICD-10-CM | POA: Insufficient documentation

## 2013-02-05 DIAGNOSIS — K5730 Diverticulosis of large intestine without perforation or abscess without bleeding: Secondary | ICD-10-CM | POA: Insufficient documentation

## 2013-02-05 DIAGNOSIS — Z800 Family history of malignant neoplasm of digestive organs: Secondary | ICD-10-CM

## 2013-02-05 HISTORY — PX: COLONOSCOPY: SHX5424

## 2013-02-05 SURGERY — COLONOSCOPY
Anesthesia: Moderate Sedation

## 2013-02-05 MED ORDER — MEPERIDINE HCL 50 MG/ML IJ SOLN
INTRAMUSCULAR | Status: DC | PRN
  Administered 2013-02-05 (×2): 25 mg via INTRAVENOUS

## 2013-02-05 MED ORDER — SIMETHICONE 40 MG/0.6ML PO SUSP
ORAL | Status: DC | PRN
  Administered 2013-02-05: 09:00:00

## 2013-02-05 MED ORDER — MEPERIDINE HCL 50 MG/ML IJ SOLN
INTRAMUSCULAR | Status: AC
  Filled 2013-02-05: qty 1

## 2013-02-05 MED ORDER — MIDAZOLAM HCL 5 MG/5ML IJ SOLN
INTRAMUSCULAR | Status: DC | PRN
  Administered 2013-02-05: 2 mg via INTRAVENOUS
  Administered 2013-02-05: 1 mg via INTRAVENOUS
  Administered 2013-02-05: 2 mg via INTRAVENOUS

## 2013-02-05 MED ORDER — MIDAZOLAM HCL 5 MG/5ML IJ SOLN
INTRAMUSCULAR | Status: AC
  Filled 2013-02-05: qty 10

## 2013-02-05 MED ORDER — SODIUM CHLORIDE 0.9 % IV SOLN
INTRAVENOUS | Status: DC
  Administered 2013-02-05: 08:00:00 via INTRAVENOUS

## 2013-02-05 NOTE — Op Note (Signed)
COLONOSCOPY PROCEDURE REPORT  PATIENT:  Heidi Lopez  MR#:  [id number] Birthdate:  [birth year], 67 y.o., female Endoscopist:  Dr. Malissa Hippo, MD Referred By:  Dr. Carylon Perches, MD Procedure Date: 02/05/2013  Procedure:   Colonoscopy  Indications:  Patient is 67 year old Caucasian female is undergoing screening colonoscopy. Patient's last exam was in 1998.Breast history significant for bilateral breast carcinoma she remains in remission. Family history significant for various malignancies including CRC in father at age 56.  Informed Consent:  The procedure and risks were reviewed with the patient and informed consent was obtained.  Medications:  Demerol 50 mg IV Versed 5 mg IV  Description of procedure:  After a digital rectal exam was performed, that colonoscope was advanced from the anus through the rectum and colon to the area of the cecum, ileocecal valve and appendiceal orifice. The cecum was deeply intubated. These structures were well-seen and photographed for the record. From the level of the cecum and ileocecal valve, the scope was slowly and cautiously withdrawn. The mucosal surfaces were carefully surveyed utilizing scope tip to flexion to facilitate fold flattening as needed. The scope was pulled down into the rectum where a thorough exam including retroflexion was performed.  Findings:   Prep satisfactory. Few scattered diverticula at sigmoid colon. Normal rectal mucosa. Hemorrhoids below the dentate line along with focal erythema dentate line.   Therapeutic/Diagnostic Maneuvers Performed:   none  Complications:  None  Cecal Withdrawal Time:  10 minutes  Impression:  Examination performed to cecum. Mild sigmoid colon diverticulosis. External hemorrhoids. No evidence of colonic polyps.  Recommendations:  Standard instructions given. Next screening exam in 10 years.  Evelyn Aguinaldo U  02/05/2013 9:10 AM  CC: Dr. Carylon Perches, MD & Dr. Bonnetta Barry ref. provider  found

## 2013-02-05 NOTE — H&P (Signed)
Heidi Lopez is an 67 y.o. female.   Chief Complaint: Patient's here for colonoscopy. HPI: Patient is 67 year old Caucasian female who is here for screening colonoscopy. Last exam was in 1998. She denies rectal bleeding or change in her bowel habits. She complains of intermittent left abdominal pain which does not appear to be associated with urgency or diarrhea. Pain may last a few minutes at a time spontaneous after meals. 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 Family history is positive for various malignancies. Father had colon carcinoma and 71. 2 sisters are deceased one died of breast cancer at age 82. She was diagnosed at age 3. Another sister had lung cancer. He has no sister was diagnosed breast CA last year at age 68.   Past Medical History  Diagnosis Date  . Cancer of breast July 2010  . Glaucoma(365)   . Cramps, muscle, general     severe breast cramping   . Heart palpitations   . Night sweats   . Pain     breast pain  . Chronic coughing   . Sinus problem   . Wears glasses   . Glaucoma(365)     left eye   . Joint pain     foot and knee pain    Past Surgical History  Procedure Laterality Date  . Breast surgery  2010    for cancer  . Tubal ligation  1984    Family History  Problem Relation Age of Onset  . Heart disease Mother   . Stroke Father   . Cancer Father   . Early death Brother   . Cancer Sister   . Cancer Sister   . Cancer Sister    Social History:  reports that she quit smoking about 46 years ago. She has never used smokeless tobacco. She reports that she does not drink alcohol or use illicit drugs.  Allergies:  Allergies  Allergen Reactions  . Reglan (Metoclopramide) Other (See Comments)    Causes severe jitters    Medications Prior to Admission  Medication Sig Dispense Refill  . bimatoprost (LUMIGAN) 0.03 % ophthalmic solution Place 1 drop into both eyes at bedtime.      .  Cholecalciferol (VITAMIN D PO) Take 1,000 Units by mouth daily.        Marland Kitchen co-enzyme Q-10 50 MG capsule Take 200 mg by mouth daily.       . Cranberry 500 MG CAPS Take 500 mg by mouth daily.        . Flaxseed, Linseed, (FLAX SEED OIL PO) Take 1,300 mg by mouth daily.       . Multiple Vitamin (MULTIVITAMIN) tablet Take 1 tablet by mouth 2 (two) times daily. Calcium plus D      . tamoxifen (NOLVADEX) 20 MG tablet Take 1 tablet (20 mg total) by mouth daily.  30 tablet  12  . peg 3350 powder (MOVIPREP) 100 G SOLR Take 1 kit (100 g total) by mouth once.  1 kit  0    No results found for this or any previous visit (from the past 48 hour(s)). No results found.  ROS  Blood pressure 138/67, pulse 73, temperature 97.6 F (36.4 C), temperature source Oral, resp. rate 13, height 5' 4.5" (1.638 m), weight 185 lb (83.915 kg), SpO2 93.00%. Physical Exam  Constitutional: She appears well-developed and well-nourished.  HENT:  Mouth/Throat: Oropharynx is clear and moist.  Eyes: Conjunctivae are normal. No scleral icterus.  Neck: No thyromegaly present.  Cardiovascular: Regular rhythm.   No murmur heard. Respiratory: Effort normal and breath sounds normal.  GI: Soft. She exhibits no distension and no mass. There is no tenderness.  Musculoskeletal: She exhibits no edema.  Lymphadenopathy:    She has no cervical adenopathy.  Neurological: She is alert.  Skin: Skin is warm and dry.     Assessment/Plan Screening colonoscopy. Family history significant for various malignancies including late onset colon carcinoma in her father(age 85).  Heidi Lopez U 02/05/2013, 8:37 AM

## 2013-02-10 ENCOUNTER — Encounter (HOSPITAL_COMMUNITY): Payer: Self-pay | Admitting: Internal Medicine

## 2013-03-19 ENCOUNTER — Other Ambulatory Visit (INDEPENDENT_AMBULATORY_CARE_PROVIDER_SITE_OTHER): Payer: Self-pay | Admitting: Surgery

## 2013-03-19 DIAGNOSIS — Z853 Personal history of malignant neoplasm of breast: Secondary | ICD-10-CM

## 2013-04-17 ENCOUNTER — Ambulatory Visit
Admission: RE | Admit: 2013-04-17 | Discharge: 2013-04-17 | Disposition: A | Discharge status: Home / Self Care | Payer: Medicare Other | Source: Ambulatory Visit | Attending: Surgery | Admitting: Surgery

## 2013-04-17 DIAGNOSIS — Z853 Personal history of malignant neoplasm of breast: Secondary | ICD-10-CM

## 2013-06-24 ENCOUNTER — Encounter (HOSPITAL_COMMUNITY): Payer: Self-pay

## 2013-06-24 ENCOUNTER — Encounter (HOSPITAL_COMMUNITY): Payer: Medicare Other | Attending: Hematology and Oncology

## 2013-06-24 VITALS — BP 145/89 | HR 70 | Temp 97.9°F | Resp 16 | Wt 188.4 lb

## 2013-06-24 DIAGNOSIS — Z853 Personal history of malignant neoplasm of breast: Secondary | ICD-10-CM

## 2013-06-24 DIAGNOSIS — Z170 Estrogen receptor positive status [ER+]: Secondary | ICD-10-CM

## 2013-06-24 DIAGNOSIS — C50919 Malignant neoplasm of unspecified site of unspecified female breast: Secondary | ICD-10-CM

## 2013-06-24 DIAGNOSIS — C50419 Malignant neoplasm of upper-outer quadrant of unspecified female breast: Secondary | ICD-10-CM

## 2013-06-24 MED ORDER — VENLAFAXINE HCL ER 37.5 MG PO CP24: 37.5000 mg | ORAL_CAPSULE | Freq: Every day | ORAL | Status: DC

## 2013-06-24 NOTE — Patient Instructions (Signed)
[facility] Discharge Instructions  RECOMMENDATIONS MADE BY THE CONSULTANT AND ANY TEST RESULTS WILL BE SENT TO YOUR REFERRING PHYSICIAN.  Return to clinic in 6 months to see MD. A prescription for Venlafaxine (Effexor) has been sent to your pharmacy. This will treat your hot flashes. Start the Venlafaxine at 1 tablet daily. If no improvement with hot flashes after 3-4 days increase to 2 tablets daily. If you increase to 2 tablets daily and it works, call us for a Tessah Patchen prescription. If you don't have any improvements at all after 7 - 10 days, still call and we can adjust medication. Report any issues/concerns as needed.  Thank you for choosing [facility] to provide your oncology and hematology care.  To afford each patient quality time with our providers, please arrive at least 15 minutes before your scheduled appointment time.  With your help, our goal is to use those 15 minutes to complete the necessary work-up to ensure our physicians have the information they need to help with your evaluation and healthcare recommendations.    Effective January 1st, 2014, we ask that you re-schedule your appointment with our physicians should you arrive 10 or more minutes late for your appointment.  We strive to give you quality time with our providers, and arriving late affects you and other patients whose appointments are after yours.    Again, thank you for choosing [facility].  Our hope is that these requests will decrease the amount of time that you wait before being seen by our physicians.       _____________________________________________________________  Should you have questions after your visit to [facility], please contact our office at [phone] between the hours of 8:30 a.m. and 5:00 p.m.  Voicemails left after 4:30 p.m. will not be returned until the following business day.  For prescription refill requests, have your pharmacy  contact our office with your prescription refill request.

## 2013-06-24 NOTE — Progress Notes (Signed)
[facility] OFFICE PROGRESS [name], MD [address] [place] [address]  DIAGNOSIS: History of breast cancer, bilateral: Right T1N0 Lumpectomy July 2010, Left T1c, N0 Lumpectomy 04/26/2010. - Plan: CBC with Differential, CEA, Cancer antigen 27.29, Comprehensive metabolic panel, CBC with Differential, Comprehensive metabolic panel, CEA, Cancer antigen 27.29  Chief Complaint  Patient presents with  . Breast Cancer  . Follow-up    CURRENT THERAPY: Tamoxifen 20 mg daily.  INTERVAL HISTORY: Heidi Lopez 67 y.o. female returns for followup of bilateral breast cancer, currently taking tamoxifen 20 mg daily switched from exemestane on 08/16/2010 after radiotherapy was completed on 07/21/2010. She also has a history of right breast cancer, lobular, diagnosed in August of 2010 which developed when she was not on any endocrine treatment after radiation treatment to the right breast. She continues to have bothersome hot flashes both day and night along with vaginal discharge. She also has some urinary leakage. She does wear pads. Appetite and good with no nausea, vomiting, fever, night sweats, lower extremity swelling or redness, chest pain, PND, orthopnea, palpitations, skin rash, headache, or seizures.   MEDICAL HISTORY: Past Medical History  Diagnosis Date  . Cancer of breast July 2010  . Glaucoma   . Cramps, muscle, general     severe breast cramping   . Heart palpitations   . Night sweats   . Pain     breast pain  . Chronic coughing   . Sinus problem   . Wears glasses   . Glaucoma     left eye   . Joint pain     foot and knee pain    INTERIM HISTORY: has History of breast cancer, bilateral: Right T1N0 Lumpectomy July 2010, Left T1c, N0 Lumpectomy 04/26/2010. and Pain in joint, ankle and foot on her problem list.    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 She had negative sentinel lymph nodes. ER +95%, PR  +96%, Ki-67 marker low at 9%, HER-2/neu not amplified. She had a lumpectomy followed by radiation therapy. Her surgery was 04/26/2010. We started tamoxifen on 08/16/2010 after radiation therapy finished on 07/21/2010. We did try to switch her to Aromasin but she had more side effects. She had hot flashes that were worrisome with the tamoxifen and she never felt good while on the drug. She is now back on tamoxifen.  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 (T1 B. N0) was surgery on 05/09/2009 status post lumpectomy with sentinel node biopsy which was negative. She then had radiation therapy. ER receptors 96% positive PR receptors 33% positive Ki-67 marker low at 8%, HER-2/neu nonamplified. She did not receive hormonal therapy after the radiation therapy on the right side  ALLERGIES:  is allergic to reglan.  MEDICATIONS: has a current medication list which includes the following prescription(s): bimatoprost, cholecalciferol, co-enzyme q-10, cranberry, flaxseed (linseed), multivitamin, tamoxifen, and venlafaxine xr.  SURGICAL HISTORY:  Past Surgical History  Procedure Laterality Date  . Breast surgery  2010    for cancer  . Tubal ligation  1984  . Colonoscopy N/A 02/05/2013    Procedure: COLONOSCOPY;  Surgeon: Malissa Hippo, MD;  [facility];  Service: Endoscopy;  Laterality: N/A;  830    FAMILY HISTORY: family history includes Cancer in her father, sister, sister, and sister; Early death in her brother; Heart disease in her mother; Stroke in her  father.  SOCIAL HISTORY:  reports that she quit smoking about 46 years ago. She has never used smokeless tobacco. She reports that she does not drink alcohol or use illicit drugs.  REVIEW OF SYSTEMS:  Other than that discussed above is noncontributory.  PHYSICAL EXAMINATION: ECOG PERFORMANCE STATUS: 1 - Symptomatic but completely ambulatory  Blood pressure 145/89, pulse 70, temperature 97.9 F  (36.6 C), temperature source Oral, resp. rate 16, weight 188 lb 6.4 oz (85.458 kg).  GENERAL:alert, no distress and comfortable SKIN: skin color, texture, turgor are normal, no rashes or significant lesions EYES: PERLA; Conjunctiva are pink and non-injected, sclera clear OROPHARYNX:no exudate, no erythema on lips, buccal mucosa, or tongue. NECK: supple, thyroid normal size, non-tender, without nodularity. No masses CHEST: Status post bilateral breast lumpectomy with no masses in either breast. LYMPH:  no palpable lymphadenopathy in the cervical, axillary or inguinal LUNGS: clear to auscultation and percussion with normal breathing effort HEART: regular rate & rhythm and no murmurs and no lower extremity edema ABDOMEN:abdomen soft, non-tender and normal bowel sounds MUSCULOSKELETAL:no cyanosis of digits and no clubbing. Range of motion normal.  NEURO: alert & oriented x 3 with fluent speech, no focal motor/sensory deficits   LABORATORY DATA: No visits with results within 30 Day(s) from this visit. Latest known visit with results is:  Office Visit on 12/23/2012  Component Date Value Range Status  . WBC 12/23/2012 7.5  4.0 - 10.5 K/uL Final  . RBC 12/23/2012 4.34  3.87 - 5.11 MIL/uL Final  . Hemoglobin 12/23/2012 13.4  12.0 - 15.0 g/dL Final  . HCT 45/40/9811 39.2  36.0 - 46.0 % Final  . MCV 12/23/2012 90.3  78.0 - 100.0 fL Final  . MCH 12/23/2012 30.9  26.0 - 34.0 pg Final  . MCHC 12/23/2012 34.2  30.0 - 36.0 g/dL Final  . RDW 91/47/8295 12.6  11.5 - 15.5 % Final  . Platelets 12/23/2012 200  150 - 400 K/uL Final  . Neutrophils Relative % 12/23/2012 63  43 - 77 % Final  . Neutro Abs 12/23/2012 4.7  1.7 - 7.7 K/uL Final  . Lymphocytes Relative 12/23/2012 26  12 - 46 % Final  . Lymphs Abs 12/23/2012 2.0  0.7 - 4.0 K/uL Final  . Monocytes Relative 12/23/2012 9  3 - 12 % Final  . Monocytes Absolute 12/23/2012 0.7  0.1 - 1.0 K/uL Final  . Eosinophils Relative 12/23/2012 2  0 - 5 % Final   . Eosinophils Absolute 12/23/2012 0.1  0.0 - 0.7 K/uL Final  . Basophils Relative 12/23/2012 1  0 - 1 % Final  . Basophils Absolute 12/23/2012 0.0  0.0 - 0.1 K/uL Final  . Sodium 12/23/2012 137  135 - 145 mEq/L Final  . Potassium 12/23/2012 3.9  3.5 - 5.1 mEq/L Final  . Chloride 12/23/2012 99  96 - 112 mEq/L Final  . CO2 12/23/2012 27  19 - 32 mEq/L Final  . Glucose, Bld 12/23/2012 104* 70 - 99 mg/dL Final  . BUN 62/13/0865 13  6 - 23 mg/dL Final  . Creatinine, Ser 12/23/2012 0.78  0.50 - 1.10 mg/dL Final  . Calcium 78/46/9629 10.1  8.4 - 10.5 mg/dL Final  . Total Protein 12/23/2012 7.3  6.0 - 8.3 g/dL Final  . Albumin 52/84/1324 4.1  3.5 - 5.2 g/dL Final  . AST 40/06/2724 26  0 - 37 U/L Final  . ALT 12/23/2012 34  0 - 35 U/L Final  . Alkaline Phosphatase 12/23/2012 70  39 -  117 U/L Final  . Total Bilirubin 12/23/2012 0.3  0.3 - 1.2 mg/dL Final  . GFR calc non Af Amer 12/23/2012 85* >90 mL/min Final  . GFR calc Af Amer 12/23/2012 >90  >90 mL/min Final   Comment:                                 The eGFR has been calculated                          using the CKD EPI equation.                          This calculation has not been                          validated in all clinical                          situations.                          eGFR's persistently                          <90 mL/min signify                          possible Chronic Kidney Disease.    PATHOLOGY:  Urinalysis No results found for this basename: colorurine,  appearanceur,  labspec,  phurine,  glucoseu,  hgbur,  bilirubinur,  ketonesur,  proteinur,  urobilinogen,  nitrite,  leukocytesur    RADIOGRAPHIC STUDIES: Mammogram August 2014 normal.  ASSESSMENT: #1.invasive ductal carcinoma the left breast, 1.7 cm in size with LV I. She had negative sentinel lymph nodes. ER +95%, PR +96%, Ki-67 marker low at 9%, HER-2/neu not amplified. She had a lumpectomy followed by radiation therapy. Her surgery was  04/26/2010. We started tamoxifen on 08/16/2010 after radiation therapy finished on 07/21/2010. We did try to switch her to Aromasin but she had more side effects. She had hot flashes that were worrisome with the tamoxifen and she never felt good while on the drug. 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 (T1 B. N0) was surgery on 05/09/2009 status post lumpectomy with sentinel node biopsy which was negative. She then had radiation therapy. ER receptors 96% positive PR receptors 33% positive Ki-67 marker low at 8%, HER-2/neu nonamplified. 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 Trial of venlafaxine 37.5 mg nightly #2. Continue tamoxifen 20 mg daily and call should any vaginal spotting occur. #3. Followup in 6 months.   All questions were answered. The patient knows to call the clinic with any problems, questions or concerns. We can certainly see the patient much sooner if necessary.   I spent 30 minutes counseling the patient face to face. The total time spent in the appointment was 25 minutes.    Maurilio Lovely, MD 06/24/2013 1:11 PM

## 2013-09-07 ENCOUNTER — Other Ambulatory Visit (HOSPITAL_COMMUNITY): Payer: Self-pay | Admitting: Oncology

## 2013-09-07 DIAGNOSIS — Z853 Personal history of malignant neoplasm of breast: Secondary | ICD-10-CM

## 2013-09-07 MED ORDER — TAMOXIFEN CITRATE 20 MG PO TABS: 20.0000 mg | ORAL_TABLET | Freq: Every day | ORAL | Status: DC

## 2013-11-09 ENCOUNTER — Ambulatory Visit (INDEPENDENT_AMBULATORY_CARE_PROVIDER_SITE_OTHER): Payer: Medicare Other | Admitting: Obstetrics & Gynecology

## 2013-11-09 ENCOUNTER — Encounter: Payer: Self-pay | Admitting: Obstetrics & Gynecology

## 2013-11-09 ENCOUNTER — Encounter (INDEPENDENT_AMBULATORY_CARE_PROVIDER_SITE_OTHER): Payer: Self-pay

## 2013-11-09 ENCOUNTER — Other Ambulatory Visit (HOSPITAL_COMMUNITY)
Admission: RE | Admit: 2013-11-09 | Discharge: 2013-11-09 | Disposition: A | Discharge status: Home / Self Care | Payer: Medicare Other | Source: Ambulatory Visit | Attending: Obstetrics & Gynecology | Admitting: Obstetrics & Gynecology

## 2013-11-09 VITALS — BP 110/70 | Ht 63.4 in | Wt 183.0 lb

## 2013-11-09 DIAGNOSIS — Z124 Encounter for screening for malignant neoplasm of cervix: Secondary | ICD-10-CM | POA: Insufficient documentation

## 2013-11-09 DIAGNOSIS — Z01419 Encounter for gynecological examination (general) (routine) without abnormal findings: Secondary | ICD-10-CM

## 2013-11-09 DIAGNOSIS — Z853 Personal history of malignant neoplasm of breast: Secondary | ICD-10-CM

## 2013-11-09 DIAGNOSIS — Z1212 Encounter for screening for malignant neoplasm of rectum: Secondary | ICD-10-CM

## 2013-11-09 NOTE — Progress Notes (Signed)
Patient ID: Heidi Lopez, female   DOB: 1945-10-28, 68 y.o.   MRN: [id number] Subjective:     Heidi Lopez is a 68 y.o. female here for a routine exam.  No LMP recorded. Patient is postmenopausal. No obstetric history on file. Birth Control Method:  na Menstrual Calendar(currently): na  Current complaints: hot flushes.   Current acute medical issues:  none   Recent Gynecologic History No LMP recorded. Patient is postmenopausal. Last Pap: 2014,  normal Last mammogram: 2014,  normal  Past Medical History  Diagnosis Date  . Cancer of breast July 2010  . Glaucoma   . Cramps, muscle, general     severe breast cramping   . Heart palpitations   . Night sweats   . Pain     breast pain  . Chronic coughing   . Sinus problem   . Wears glasses   . Glaucoma     left eye   . Joint pain     foot and knee pain    Past Surgical History  Procedure Laterality Date  . Breast surgery  2010    for cancer  . Tubal ligation  1984  . Colonoscopy N/A 02/05/2013    Procedure: COLONOSCOPY;  Surgeon: [name], MD;  Location: [facility];  Service: Endoscopy;  Laterality: N/A;  15    OB History   Grav Para Term Preterm Abortions TAB SAB Ect Mult Living                  History   Social History  . Marital Status: Widowed    Spouse Name: N/A    Number of Children: N/A  . Years of Education: N/A   Social History Main Topics  . Smoking status: Former Smoker    Quit date: 11/16/1966  . Smokeless tobacco: Never Used  . Alcohol Use: No  . Drug Use: No  . Sexual Activity: None   Other Topics Concern  . None   Social History Narrative  . None    Family History  Problem Relation Age of Onset  . Heart disease Mother   . Stroke Father   . Cancer Father   . Early death Brother   . Cancer Sister   . Cancer Sister   . Cancer Sister   . Diabetes Daughter      Review of Systems  Review of Systems  Constitutional: Negative for fever, chills, weight loss,  malaise/fatigue and diaphoresis.  HENT: Negative for hearing loss, ear pain, nosebleeds, congestion, sore throat, neck pain, tinnitus and ear discharge.   Eyes: Negative for blurred vision, double vision, photophobia, pain, discharge and redness.  Respiratory: Negative for cough, hemoptysis, sputum production, shortness of breath, wheezing and stridor.   Cardiovascular: Negative for chest pain, palpitations, orthopnea, claudication, leg swelling and PND.  Gastrointestinal: negative for abdominal pain. Negative for heartburn, nausea, vomiting, diarrhea, constipation, blood in stool and melena.  Genitourinary: Negative for dysuria, urgency, frequency, hematuria and flank pain.  Musculoskeletal: Negative for myalgias, back pain, joint pain and falls.  Skin: Negative for itching and rash.  Neurological: Negative for dizziness, tingling, tremors, sensory change, speech change, focal weakness, seizures, loss of consciousness, weakness and headaches.  Endo/Heme/Allergies: Negative for environmental allergies and polydipsia. Does not bruise/bleed easily.  Psychiatric/Behavioral: Negative for depression, suicidal ideas, hallucinations, memory loss and substance abuse. The patient is not nervous/anxious and does not have insomnia.        Objective:    Physical Exam  Vitals reviewed. Constitutional: She is oriented to person, place, and time. She appears well-developed and well-nourished.  HENT:  Head: Normocephalic and atraumatic.        Right Ear: External ear normal.  Left Ear: External ear normal.  Nose: Nose normal.  Mouth/Throat: Oropharynx is clear and moist.  Eyes: Conjunctivae and EOM are normal. Pupils are equal, round, and reactive to light. Right eye exhibits no discharge. Left eye exhibits no discharge. No scleral icterus.  Neck: Normal range of motion. Neck supple. No tracheal deviation present. No thyromegaly present.  Cardiovascular: Normal rate, regular rhythm, normal heart sounds  and intact distal pulses.  Exam reveals no gallop and no friction rub.   No murmur heard. Respiratory: Effort normal and breath sounds normal. No respiratory distress. She has no wheezes. She has no rales. She exhibits no tenderness.  GI: Soft. Bowel sounds are normal. She exhibits no distension and no mass. There is no tenderness. There is no rebound and no guarding.  Genitourinary:  Breasts no masses skin changes or nipple changes bilaterally      Vulva is normal without lesions Vagina is pink moist without discharge Cervix normal in appearance and pap is done Uterus is normal size shape and contour Adnexa is negative with normal sized ovaries  Rectal    hemoccult negative, normal tone, no masses  Musculoskeletal: Normal range of motion. She exhibits no edema and no tenderness.  Neurological: She is alert and oriented to person, place, and time. She has normal reflexes. She displays normal reflexes. No cranial nerve deficit. She exhibits normal muscle tone. Coordination normal.  Skin: Skin is warm and dry. No rash noted. No erythema. No pallor.  Psychiatric: She has a normal mood and affect. Her behavior is normal. Judgment and thought content normal.       Assessment:    Healthy female exam.   Breast cancer Plan:    Follow up in: 1 year.

## 2013-12-21 ENCOUNTER — Encounter (HOSPITAL_COMMUNITY): Payer: Medicare Other | Attending: Hematology and Oncology

## 2013-12-21 DIAGNOSIS — Z853 Personal history of malignant neoplasm of breast: Secondary | ICD-10-CM

## 2013-12-21 DIAGNOSIS — Z79899 Other long term (current) drug therapy: Secondary | ICD-10-CM | POA: Insufficient documentation

## 2013-12-21 DIAGNOSIS — Z888 Allergy status to other drugs, medicaments and biological substances status: Secondary | ICD-10-CM | POA: Insufficient documentation

## 2013-12-21 DIAGNOSIS — Z9010 Acquired absence of unspecified breast and nipple: Secondary | ICD-10-CM | POA: Insufficient documentation

## 2013-12-21 DIAGNOSIS — Z923 Personal history of irradiation: Secondary | ICD-10-CM | POA: Insufficient documentation

## 2013-12-21 DIAGNOSIS — Z79810 Long term (current) use of selective estrogen receptor modulators (SERMs): Secondary | ICD-10-CM | POA: Insufficient documentation

## 2013-12-21 DIAGNOSIS — Z87891 Personal history of nicotine dependence: Secondary | ICD-10-CM | POA: Insufficient documentation

## 2013-12-21 DIAGNOSIS — C50919 Malignant neoplasm of unspecified site of unspecified female breast: Secondary | ICD-10-CM

## 2013-12-21 DIAGNOSIS — Z809 Family history of malignant neoplasm, unspecified: Secondary | ICD-10-CM | POA: Insufficient documentation

## 2013-12-21 DIAGNOSIS — Z09 Encounter for follow-up examination after completed treatment for conditions other than malignant neoplasm: Secondary | ICD-10-CM | POA: Insufficient documentation

## 2013-12-21 DIAGNOSIS — H409 Unspecified glaucoma: Secondary | ICD-10-CM | POA: Insufficient documentation

## 2013-12-21 LAB — CBC WITH DIFFERENTIAL/PLATELET
BASOS ABS: 0.1 10*3/uL (ref 0.0–0.1)
BASOS PCT: 1 % (ref 0–1)
EOS ABS: 0.2 10*3/uL (ref 0.0–0.7)
EOS PCT: 2 % (ref 0–5)
HEMATOCRIT: 36.7 % (ref 36.0–46.0)
Hemoglobin: 12.4 g/dL (ref 12.0–15.0)
Lymphocytes Relative: 28 % (ref 12–46)
Lymphs Abs: 2.3 10*3/uL (ref 0.7–4.0)
MCH: 31.1 pg (ref 26.0–34.0)
MCHC: 33.8 g/dL (ref 30.0–36.0)
MCV: 92 fL (ref 78.0–100.0)
MONO ABS: 0.8 10*3/uL (ref 0.1–1.0)
Monocytes Relative: 10 % (ref 3–12)
Neutro Abs: 4.7 10*3/uL (ref 1.7–7.7)
Neutrophils Relative %: 58 % (ref 43–77)
Platelets: 183 10*3/uL (ref 150–400)
RBC: 3.99 MIL/uL (ref 3.87–5.11)
RDW: 12.9 % (ref 11.5–15.5)
WBC: 8 10*3/uL (ref 4.0–10.5)

## 2013-12-21 LAB — COMPREHENSIVE METABOLIC PANEL
ALBUMIN: 3.8 g/dL (ref 3.5–5.2)
ALT: 39 U/L — ABNORMAL HIGH (ref 0–35)
AST: 30 U/L (ref 0–37)
Alkaline Phosphatase: 68 U/L (ref 39–117)
BILIRUBIN TOTAL: 0.2 mg/dL — AB (ref 0.3–1.2)
BUN: 12 mg/dL (ref 6–23)
CALCIUM: 9.2 mg/dL (ref 8.4–10.5)
CHLORIDE: 106 meq/L (ref 96–112)
CO2: 24 mEq/L (ref 19–32)
CREATININE: 0.76 mg/dL (ref 0.50–1.10)
GFR calc Af Amer: >90 mL/min (ref >90)
GFR calc non Af Amer: 85 mL/min — ABNORMAL LOW (ref >90)
Glucose, Bld: 110 mg/dL — ABNORMAL HIGH (ref 70–99)
Potassium: 4.4 mEq/L (ref 3.7–5.3)
Sodium: 143 mEq/L (ref 137–147)
TOTAL PROTEIN: 6.9 g/dL (ref 6.0–8.3)

## 2013-12-21 NOTE — Progress Notes (Signed)
Labs drawn today for cbc/diff,cmp,cea,[id number] 

## 2013-12-22 LAB — CEA: CEA: 1.7 ng/mL (ref 0.0–5.0)

## 2013-12-22 LAB — CANCER ANTIGEN 27.29: CA 27.29: 23 U/mL (ref 0–39)

## 2013-12-23 ENCOUNTER — Encounter (HOSPITAL_BASED_OUTPATIENT_CLINIC_OR_DEPARTMENT_OTHER): Payer: Medicare Other

## 2013-12-23 ENCOUNTER — Encounter (HOSPITAL_COMMUNITY): Payer: Self-pay

## 2013-12-23 VITALS — BP 134/84 | HR 87 | Temp 97.8°F | Resp 16 | Wt 188.7 lb

## 2013-12-23 DIAGNOSIS — C50919 Malignant neoplasm of unspecified site of unspecified female breast: Secondary | ICD-10-CM

## 2013-12-23 DIAGNOSIS — Z853 Personal history of malignant neoplasm of breast: Secondary | ICD-10-CM

## 2013-12-23 DIAGNOSIS — Z170 Estrogen receptor positive status [ER+]: Secondary | ICD-10-CM

## 2013-12-23 DIAGNOSIS — J309 Allergic rhinitis, unspecified: Secondary | ICD-10-CM

## 2013-12-23 NOTE — Patient Instructions (Signed)
Heidi Lopez Discharge Instructions  RECOMMENDATIONS MADE BY THE CONSULTANT AND ANY TEST RESULTS WILL BE SENT TO YOUR REFERRING PHYSICIAN.  We will make a referral to PT for sleeves for your arms. Follow up in 6 months with labs and doctor visit. Decrease your tamoxifen to 10mg  per day. Cal the clinic if that dose works for you and we will write a prescription for you.  Thank you for choosing [facility] to provide your oncology and hematology care.  To afford each patient quality time with our providers, please arrive at least 15 minutes before your scheduled appointment time.  With your help, our goal is to use those 15 minutes to complete the necessary work-up to ensure our physicians have the information they need to help with your evaluation and healthcare recommendations.    Effective January 1st, 2014, we ask that you re-schedule your appointment with our physicians should you arrive 10 or more minutes late for your appointment.  We strive to give you quality time with our providers, and arriving late affects you and other patients whose appointments are after yours.    Again, thank you for choosing [facility].  Our hope is that these requests will decrease the amount of time that you wait before being seen by our physicians.       _____________________________________________________________  Should you have questions after your visit to [facility], please contact our office at [phone] between the hours of 8:30 a.m. and 5:00 p.m.  Voicemails left after 4:30 p.m. will not be returned until the following business day.  For prescription refill requests, have your pharmacy contact our office with your prescription refill request.

## 2013-12-23 NOTE — Progress Notes (Signed)
[facility]  OFFICE PROGRESS Heidi Miner, MD [address] [place] [address]  DIAGNOSIS: No diagnosis found.  Chief Complaint  Patient presents with  . Breast Cancer    CURRENT THERAPY: Tamoxifen 20 mg daily  INTERVAL HISTORY: Heidi Lopez 68 y.o. female returns for followup of bilateral breast cancer, status post right lumpectomy in July 2010, status post left lumpectomy August 2011, both ER positive, having not taken any endocrine treatment after July 2010.  After reading the potential side effects of venlafaxine she decided not to purchase the drug. She continues to have hot flashes but she claims he does not significant. She's also had vaginal itching without lower extremity swelling or redness. She denies any PND, orthopnea, palpitations, headache, but does have nasal drip at the time of your without sore throat, earache, or significant expectoration. She denies any diarrhea, constipation, melena, hematochezia, hematuria, vaginal bleeding, skin rash, headache, or seizures.  MEDICAL HISTORY: Past Medical History  Diagnosis Date  . Cancer of breast July 2010  . Glaucoma   . Cramps, muscle, general     severe breast cramping   . Heart palpitations   . Night sweats   . Pain     breast pain  . Chronic coughing   . Sinus problem   . Wears glasses   . Glaucoma     left eye   . Joint pain     foot and knee pain    INTERIM HISTORY: has History of breast cancer, bilateral: Right T1N0 Lumpectomy July 2010, Left T1c, N0 Lumpectomy 04/26/2010. and Pain in joint, ankle and foot on her problem list.   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 She had negative sentinel lymph nodes. ER +95%, PR +96%, Ki-67 marker low at 9%, HER-2/neu not amplified. She had a lumpectomy followed by radiation therapy. Her surgery was 04/26/2010. We started tamoxifen on 08/16/2010 after radiation therapy  finished on 07/21/2010. We did try to switch her to Aromasin but she had more side effects. She had hot flashes that were worrisome with the tamoxifen and she never felt good while on the drug. She is now back on tamoxifen.  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 (T1 B. N0) was surgery on 05/09/2009 status post lumpectomy with sentinel node biopsy which was negative. She then had radiation therapy. ER receptors 96% positive PR receptors 33% positive Ki-67 marker low at 8%, HER-2/neu nonamplified. She did not receive hormonal therapy after the radiation therapy on the right side  ALLERGIES:  is allergic to reglan.  MEDICATIONS: has a current medication list which includes the following prescription(s): bimatoprost, cholecalciferol, co-enzyme q-10, cranberry, diphenhydramine, flaxseed (linseed), multivitamin, fish oil, tamoxifen, and venlafaxine xr.  SURGICAL HISTORY:  Past Surgical History  Procedure Laterality Date  . Breast surgery  2010    for cancer  . Tubal ligation  1984  . Colonoscopy N/A 02/05/2013    Procedure: COLONOSCOPY;  Surgeon: [name], MD;  [facility]: [facility];  Service: Endoscopy;  Laterality: N/A;  67    FAMILY HISTORY: family history includes Cancer in her father, sister, sister, and sister; Diabetes in her daughter; Early death in her brother; Heart disease in her mother; Stroke in her father.  SOCIAL HISTORY:  reports that she quit smoking about 47 years ago. She has never used  smokeless tobacco. She reports that she does not drink alcohol or use illicit drugs.  REVIEW OF SYSTEMS:  Other than that discussed above is noncontributory.  PHYSICAL EXAMINATION: ECOG PERFORMANCE STATUS: 1 - Symptomatic but completely ambulatory  Blood pressure 134/84, pulse 87, temperature 97.8 F (36.6 C), temperature source Oral, resp. rate 16, weight 188 lb 11.2 oz (85.594 kg).  GENERAL:alert, no distress and comfortable SKIN: skin  color, texture, turgor are normal, no rashes or significant lesions EYES: PERLA; Conjunctiva are pink and non-injected, sclera clear SINUSES: No redness or tenderness over maxillary or ethmoid sinuses. Bilateral rhinorrhea. OROPHARYNX:no exudate, no erythema on lips, buccal mucosa, or tongue. NECK: supple, thyroid normal size, non-tender, without nodularity. No masses CHEST: Status post bilateral breast lumpectomy with no masses felt. LYMPH:  no palpable lymphadenopathy in the cervical, axillary or inguinal LUNGS: clear to auscultation and percussion with normal breathing effort HEART: regular rate & rhythm and no murmurs. ABDOMEN:abdomen soft, non-tender and normal bowel sounds MUSCULOSKELETAL:no cyanosis of digits and no clubbing. Range of motion normal.  NEURO: alert & oriented x 3 with fluent speech, no focal motor/sensory deficits   LABORATORY DATA: Infusion on 12/21/2013  Component Date Value Ref Range Status  . CA 27.29 12/21/2013 23  0 - 39 U/mL Final   Performed at [facility]  . WBC 12/21/2013 8.0  4.0 - 10.5 K/uL Final  . RBC 12/21/2013 3.99  3.87 - 5.11 MIL/uL Final  . Hemoglobin 12/21/2013 12.4  12.0 - 15.0 g/dL Final  . HCT 12/21/2013 36.7  36.0 - 46.0 % Final  . MCV 12/21/2013 92.0  78.0 - 100.0 fL Final  . MCH 12/21/2013 31.1  26.0 - 34.0 pg Final  . MCHC 12/21/2013 33.8  30.0 - 36.0 g/dL Final  . RDW 12/21/2013 12.9  11.5 - 15.5 % Final  . Platelets 12/21/2013 183  150 - 400 K/uL Final  . Neutrophils Relative % 12/21/2013 58  43 - 77 % Final  . Neutro Abs 12/21/2013 4.7  1.7 - 7.7 K/uL Final  . Lymphocytes Relative 12/21/2013 28  12 - 46 % Final  . Lymphs Abs 12/21/2013 2.3  0.7 - 4.0 K/uL Final  . Monocytes Relative 12/21/2013 10  3 - 12 % Final  . Monocytes Absolute 12/21/2013 0.8  0.1 - 1.0 K/uL Final  . Eosinophils Relative 12/21/2013 2  0 - 5 % Final  . Eosinophils Absolute 12/21/2013 0.2  0.0 - 0.7 K/uL Final  . Basophils Relative 12/21/2013 1  0 -  1 % Final  . Basophils Absolute 12/21/2013 0.1  0.0 - 0.1 K/uL Final  . CEA 12/21/2013 1.7  0.0 - 5.0 ng/mL Final   Performed at [facility]  . Sodium 12/21/2013 143  137 - 147 mEq/L Final  . Potassium 12/21/2013 4.4  3.7 - 5.3 mEq/L Final  . Chloride 12/21/2013 106  96 - 112 mEq/L Final  . CO2 12/21/2013 24  19 - 32 mEq/L Final  . Glucose, Bld 12/21/2013 110* 70 - 99 mg/dL Final  . BUN 12/21/2013 12  6 - 23 mg/dL Final  . Creatinine, Ser 12/21/2013 0.76  0.50 - 1.10 mg/dL Final  . Calcium 12/21/2013 9.2  8.4 - 10.5 mg/dL Final  . Total Protein 12/21/2013 6.9  6.0 - 8.3 g/dL Final  . Albumin 12/21/2013 3.8  3.5 - 5.2 g/dL Final  . AST 12/21/2013 30  0 - 37 U/L Final  . ALT 12/21/2013 39* 0 - 35 U/L Final  .  Alkaline Phosphatase 12/21/2013 68  39 - 117 U/L Final  . Total Bilirubin 12/21/2013 0.2* 0.3 - 1.2 mg/dL Final  . GFR calc non Af Amer 12/21/2013 85* >90 mL/min Final  . GFR calc Af Amer 12/21/2013 >90  >90 mL/min Final   Comment: (NOTE)                          The eGFR has been calculated using the CKD EPI equation.                          This calculation has not been validated in all clinical situations.                          eGFR's persistently <90 mL/min signify possible Chronic Kidney                          Disease.    PATHOLOGY: for LOWANDA, CASHAW ([id number]) Patient: JACKQULYN, MENDEL Collected: 11/09/2013 Client: [facility] Accession: [id number] Received: 11/11/2013 Tania Ade, MD DOB: 1945/12/19 Age: 57 Gender: F Reported: 11/12/2013 [address] Patient Ph: [phone] MRN#: [id number] Linna Hoff [place] [id number] Client Acc#: Chart: Phone: [phone] Fax: LMP: Visit#: [id number] CC: GYNECOLOGIC CYTOLOGY REPORT Adequacy Reason Satisfactory for evaluation, endocervical/transformation zone component PRESENT. Diagnosis NEGATIVE FOR INTRAEPITHELIAL LESIONS OR MALIGNANCY. ORGANISM(S) FUNGAL ORGANISMS PRESENT CONSISTENT WITH  CANDIDA. DEBRA GIBSON Runner, broadcasting/film/video (Case signed 11/12/2013) Source CervicoVaginal Pap [ThinPrep Imaged] Note: The PAP test is a screening test, not a diagnostic procedure and should not be used as the sole means of detecting cervical cancer. The pap technique in the best of hands is subject to both false negative and false positive result at low but significant rates as evidenced by published data. This result should be interpreted in the context of this data, plus your patient's history and clinical findings Report signed out from the following location(s) Technical Component and Interpretation performed at [facility], [place], [place] [address]. CLIA #: [id number],  Urinalysis No results found for this basename: colorurine, appearanceur, labspec, phurine, glucoseu, hgbur, bilirubinur, ketonesur, proteinur, urobilinogen, nitrite, leukocytesur    RADIOGRAPHIC STUDIES: MM Digital Diagnostic Bilat Status: Final result            Study Result    *RADIOLOGY REPORT*  Clinical Data: Annual mammogram with left lumpectomy 2011 and  right lumpectomy 2010  DIGITAL DIAGNOSTIC BILATERAL MAMMOGRAM WITH CAD  Comparison: 04/16/2012, 04/11/2011, 04/05/2010  Findings:  ACR Breast Density Category b: There are scattered areas of  fibroglandular density.  There is postsurgical scar in the 12 o'clock position of both  breasts. This is stable. There are fat necrosis calcifications in  the upper inner quadrant of the left breast and in the 12 o'clock  position of the right breast. These are stable. There are no  suspicious findings or significant interval change.  Mammographic images were processed with CAD.  IMPRESSION:  Stable benign postoperative appearance  BI-RADS CATEGORY 2: Benign finding(s).  RECOMMENDATION:  Diagnostic bilateral mammogram in 1 year.  I have discussed the findings and recommendations with the patient.  Results  were also provided in writing at the conclusion of the  visit. If applicable, a reminder letter will be sent to the  patient regarding her next appointment.  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 She had negative sentinel lymph nodes. ER +95%, PR +96%, Ki-67 marker low at 9%, HER-2/neu not amplified. She had a lumpectomy followed by radiation therapy. Her surgery was 04/26/2010. We started tamoxifen on 08/16/2010 after radiation therapy finished on 07/21/2010. We did try to switch her to Aromasin but she had more side effects. She had hot flashes that were worrisome with the tamoxifen and she never felt good while on the drug. 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 (T1 B. N0) was surgery on 05/09/2009 status post lumpectomy with sentinel node biopsy which was negative. She then had radiation therapy. ER receptors 96% positive PR receptors 33% positive Ki-67 marker low at 8%, HER-2/neu nonamplified. She did not receive hormonal therapy after the radiation therapy on the right side  #3. 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 #4. Allergic rhinitis, on treatment.      PLAN:  #1. Patient was advised to decrease tamoxifen to 10 mg daily by splitting the 20 mg tablet in half. If hot flashes and vaginal itching are improved on lower dose, she was told to call so that the 10 mg tablet to be ordered. Plan his lifetime tamoxifen therapy. #2. Followup in 6 months with CBC, chem profile, CEA, and CA 27-29.   All questions were answered. The patient knows to call the clinic with any problems, questions or concerns. We can certainly see the patient much sooner if necessary.   I spent 25 minutes counseling the patient face to face. The total time spent in the appointment was 30 minutes.    Farrel Gobble, MD 12/23/2013 3:13 PM

## 2014-01-18 ENCOUNTER — Telehealth (HOSPITAL_COMMUNITY): Payer: Self-pay | Admitting: Hematology and Oncology

## 2014-01-18 ENCOUNTER — Other Ambulatory Visit (HOSPITAL_COMMUNITY): Payer: Self-pay | Admitting: Hematology and Oncology

## 2014-01-18 MED ORDER — TAMOXIFEN CITRATE 10 MG PO TABS: ORAL_TABLET | ORAL | Status: DC

## 2014-03-16 ENCOUNTER — Other Ambulatory Visit (INDEPENDENT_AMBULATORY_CARE_PROVIDER_SITE_OTHER): Payer: Self-pay | Admitting: Surgery

## 2014-03-16 DIAGNOSIS — Z853 Personal history of malignant neoplasm of breast: Secondary | ICD-10-CM

## 2014-04-19 ENCOUNTER — Ambulatory Visit
Admission: RE | Admit: 2014-04-19 | Discharge: 2014-04-19 | Disposition: A | Discharge status: Home / Self Care | Payer: Medicare Other | Source: Ambulatory Visit | Attending: Surgery | Admitting: Surgery

## 2014-04-19 ENCOUNTER — Encounter (INDEPENDENT_AMBULATORY_CARE_PROVIDER_SITE_OTHER): Payer: Self-pay

## 2014-04-19 DIAGNOSIS — Z853 Personal history of malignant neoplasm of breast: Secondary | ICD-10-CM

## 2014-05-18 DIAGNOSIS — I4819 Other persistent atrial fibrillation: Secondary | ICD-10-CM

## 2014-05-18 HISTORY — DX: Other persistent atrial fibrillation: I48.19

## 2014-05-31 ENCOUNTER — Ambulatory Visit (HOSPITAL_COMMUNITY)
Admission: RE | Admit: 2014-05-31 | Discharge: 2014-05-31 | Disposition: A | Discharge status: Home / Self Care | Payer: Medicare Other | Source: Ambulatory Visit | Attending: Internal Medicine | Admitting: Internal Medicine

## 2014-05-31 ENCOUNTER — Other Ambulatory Visit (HOSPITAL_COMMUNITY): Payer: Self-pay | Admitting: Internal Medicine

## 2014-05-31 DIAGNOSIS — R062 Wheezing: Secondary | ICD-10-CM

## 2014-05-31 DIAGNOSIS — R05 Cough: Secondary | ICD-10-CM | POA: Insufficient documentation

## 2014-05-31 DIAGNOSIS — R059 Cough, unspecified: Secondary | ICD-10-CM | POA: Diagnosis present

## 2014-06-04 ENCOUNTER — Ambulatory Visit (HOSPITAL_COMMUNITY)
Admission: RE | Admit: 2014-06-04 | Discharge: 2014-06-04 | Disposition: A | Discharge status: Home / Self Care | Payer: Medicare Other | Source: Ambulatory Visit | Attending: Internal Medicine | Admitting: Internal Medicine

## 2014-06-04 DIAGNOSIS — C50919 Malignant neoplasm of unspecified site of unspecified female breast: Secondary | ICD-10-CM | POA: Diagnosis not present

## 2014-06-04 DIAGNOSIS — I4891 Unspecified atrial fibrillation: Secondary | ICD-10-CM | POA: Diagnosis not present

## 2014-06-04 DIAGNOSIS — I079 Rheumatic tricuspid valve disease, unspecified: Secondary | ICD-10-CM | POA: Diagnosis not present

## 2014-06-04 DIAGNOSIS — R002 Palpitations: Secondary | ICD-10-CM | POA: Diagnosis present

## 2014-06-04 DIAGNOSIS — Z923 Personal history of irradiation: Secondary | ICD-10-CM | POA: Insufficient documentation

## 2014-06-04 DIAGNOSIS — I369 Nonrheumatic tricuspid valve disorder, unspecified: Secondary | ICD-10-CM

## 2014-06-04 NOTE — Progress Notes (Signed)
  Echocardiogram 2D Echocardiogram has been performed.  [place], [place] 06/04/2014, 2:54 PM

## 2014-06-17 ENCOUNTER — Encounter (INDEPENDENT_AMBULATORY_CARE_PROVIDER_SITE_OTHER): Payer: Medicare Other | Admitting: Surgery

## 2014-06-18 ENCOUNTER — Ambulatory Visit (INDEPENDENT_AMBULATORY_CARE_PROVIDER_SITE_OTHER): Payer: Medicare Other | Admitting: Cardiology

## 2014-06-18 ENCOUNTER — Encounter: Payer: Self-pay | Admitting: Cardiology

## 2014-06-18 ENCOUNTER — Other Ambulatory Visit (HOSPITAL_COMMUNITY): Payer: Self-pay

## 2014-06-18 VITALS — BP 145/83 | HR 68 | Ht 64.5 in | Wt 193.8 lb

## 2014-06-18 DIAGNOSIS — I4891 Unspecified atrial fibrillation: Secondary | ICD-10-CM

## 2014-06-18 DIAGNOSIS — Z853 Personal history of malignant neoplasm of breast: Secondary | ICD-10-CM

## 2014-06-18 MED ORDER — DILTIAZEM HCL ER COATED BEADS 300 MG PO CP24: 300.0000 mg | ORAL_CAPSULE | Freq: Every day | ORAL | Status: DC

## 2014-06-18 NOTE — Patient Instructions (Addendum)
Your physician recommends that you schedule a follow-up appointment in: 4-6 weeks     Your physician has recommended you make the following change in your medication:   STOP Aspirin  Take Eliquis 5 mg twice a day . I have given you samples , after Dr.Branch gets your EKG for review from Dr.Fagan we may call in a prescription for you  INCREASE Cardizem CD to 300 mg daily  Thank you for choosing [facility] !

## 2014-06-18 NOTE — Progress Notes (Signed)
Clinical Summary Heidi Lopez is a 68 y.o.female seen today as a new patient for the following medical problems.  1. Afib - appears to be fairly new diagnosis, detected this month at pcp visit. She was started on diltiazem 240mg  daily. - still feels episodes of palpt - echo LVEF 29-51%, grade I diastolic dysfunction, mild LAE - K 4.5, TSH 0.685. EKGs are not available at this time.    - checks pulse regularly, can have episodes of increased rates to 130s to 140s. Symptoms improved by not resolved with dilt, still having symptoms.   2. Breast cancer - followed by cancer center. S/p right and left lumpectomy. Currently on tamoxifen.  Past Medical History  Diagnosis Date  . Cancer of breast July 2010  . Glaucoma   . Cramps, muscle, general     severe breast cramping   . Heart palpitations   . Night sweats   . Pain     breast pain  . Chronic coughing   . Sinus problem   . Wears glasses   . Glaucoma     left eye   . Joint pain     foot and knee pain     Allergies  Allergen Reactions  . Reglan [Metoclopramide] Other (See Comments)    Causes severe jitters     Current Outpatient Prescriptions  Medication Sig Dispense Refill  . bimatoprost (LUMIGAN) 0.03 % ophthalmic solution Place 1 drop into both eyes at bedtime.      . Cholecalciferol (VITAMIN D PO) Take 1,000 Units by mouth daily.        Marland Kitchen co-enzyme Q-10 50 MG capsule Take 200 mg by mouth daily.       . Cranberry 500 MG CAPS Take 500 mg by mouth daily.        . diphenhydrAMINE (BENADRYL) 25 MG tablet Take 25 mg by mouth every 6 (six) hours as needed.      . Flaxseed, Linseed, (FLAX SEED OIL PO) Take 1,300 mg by mouth daily.       . Multiple Vitamin (MULTIVITAMIN) tablet Take 1 tablet by mouth daily. Calcium plus D      . Omega-3 Fatty Acids (FISH OIL) 1000 MG CAPS Take by mouth.      . tamoxifen (NOLVADEX) 10 MG tablet Take 10 mg daily.  90 tablet  3  . venlafaxine XR (EFFEXOR-XR) 37.5 MG 24 hr capsule Take 1  capsule (37.5 mg total) by mouth daily.  30 capsule  6   No current facility-administered medications for this visit.     Past Surgical History  Procedure Laterality Date  . Breast surgery  2010    for cancer  . Tubal ligation  1984  . Colonoscopy N/A 02/05/2013    Procedure: COLONOSCOPY;  Surgeon: [name], MD;  Location: [facility];  Service: Endoscopy;  Laterality: N/A;  830     Allergies  Allergen Reactions  . Reglan [Metoclopramide] Other (See Comments)    Causes severe jitters      Family History  Problem Relation Age of Onset  . Heart disease Mother   . Stroke Father   . Cancer Father   . Early death Brother   . Cancer Sister   . Cancer Sister   . Cancer Sister   . Diabetes Daughter      Social History Heidi Lopez reports that she quit smoking about 47 years ago. She has never used smokeless tobacco. Heidi Lopez reports that she  does not drink alcohol.   Review of Systems CONSTITUTIONAL: No weight loss, fever, chills, weakness or fatigue.  HEENT: Eyes: No visual loss, blurred vision, double vision or yellow sclerae.No hearing loss, sneezing, congestion, runny nose or sore throat.  SKIN: No rash or itching.  CARDIOVASCULAR: per HPI RESPIRATORY: No shortness of breath, cough or sputum.  GASTROINTESTINAL: No anorexia, nausea, vomiting or diarrhea. No abdominal pain or blood.  GENITOURINARY: No burning on urination, no polyuria NEUROLOGICAL: No headache, dizziness, syncope, paralysis, ataxia, numbness or tingling in the extremities. No change in bowel or bladder control.  MUSCULOSKELETAL: No muscle, back pain, joint pain or stiffness.  LYMPHATICS: No enlarged nodes. No history of splenectomy.  PSYCHIATRIC: No history of depression or anxiety.  ENDOCRINOLOGIC: No reports of sweating, cold or heat intolerance. No polyuria or polydipsia.  Marland Kitchen   Physical Examination p 68 bp 145/83 Wt 193 lbs BMI 33 Gen: resting comfortably, no acute distress HEENT: no  scleral icterus, pupils equal round and reactive, no palptable cervical adenopathy,  CV: RRR, no m/r/g, no JVD, no carotid bruits Resp: Clear to auscultation bilaterally GI: abdomen is soft, non-tender, non-distended, normal bowel sounds, no hepatosplenomegaly MSK: extremities are warm, no edema.  Skin: warm, no rash Neuro:  no focal deficits Psych: appropriate affect   Diagnostic Studies 06/04/14 Echo Study Conclusions  - Left ventricle: The cavity size was normal. Systolic function was normal. The estimated ejection fraction was in the range of 55% to 60%. Wall motion was normal; there were no regional wall motion abnormalities. Doppler parameters are consistent with abnormal left ventricular relaxation (grade 1 diastolic dysfunction). Borderline concentric LVH. - Left atrium: The atrium was mildly dilated. - Tricuspid valve: There was mild regurgitation. - Pulmonary arteries: PA peak pressure: 32 mm Hg (S).     Assessment and Plan  1. Afib - diagnosed recently by pcp, we are awaiting the EKGs from that visit. EKG in clinic here today shows NSR - notes some continued increased rates at times, increase diltiazem to 300mg  daily - CHADS2Vasc score is 2, anticoagulation is recommended. Discussed in details the risks and benefits of ASA vs anticoagulation. She is in favor of anticoag. Will start eliquis 5mg  bid once EKGs received and afib confirmed.   Zandra Abts MD      Arnoldo Lenis, M.D.

## 2014-06-21 ENCOUNTER — Telehealth: Payer: Self-pay | Admitting: Cardiology

## 2014-06-21 ENCOUNTER — Encounter: Payer: Self-pay | Admitting: Cardiology

## 2014-06-21 ENCOUNTER — Telehealth: Payer: Self-pay

## 2014-06-21 MED ORDER — APIXABAN 5 MG PO TABS: 5.0000 mg | ORAL_TABLET | Freq: Two times a day (BID) | ORAL | Status: DC

## 2014-06-21 NOTE — Telephone Encounter (Signed)
Patient would like to discuss her meds and what to do prior to dental appointment. /t gs

## 2014-06-21 NOTE — Telephone Encounter (Signed)
Spoke with Gay Filler our pharmacist and pt does not need to hold eliquis for routine dental check up/cleaning.I relayed this to patient

## 2014-06-21 NOTE — Telephone Encounter (Signed)
E-scribed rx,called pt,she will start eliquis 5 mg BID

## 2014-06-21 NOTE — Telephone Encounter (Signed)
Message copied by Bernita Raisin on Mon Jun 21, 2014 12:27 PM ------      Message from: [place] [facility]      Created: Mon Jun 21, 2014 12:04 PM      Regarding: RE: ekg's       EKG shows afib, please start her on eliquis 5mg  bid.            Zandra Abts MD      ----- Message -----         From: Bernita Raisin, RN         Sent: 06/21/2014   9:32 AM           To: Arnoldo Lenis, MD      Subject: ekg's                                                    Road blocks ! Office states they faxed on Friday,terry did not receive,now the computer is slow.i have terry watching fax machine      ----- Message -----         From: Arnoldo Lenis, MD         Sent: 06/21/2014   8:31 AM           To: [place],            Can we follow up on having the EKGs sent from Dr Beverlee Nims office on this patient. I wanted to varify her afib before starting blood thinner, there power was out when we called before            Zandra Abts MD      ----- Message -----         From: Arnoldo Lenis, MD         Sent: 06/18/2014   2:49 PM           To: Arnoldo Lenis, MD            F/u EKGs from Dr Beverlee Nims office on [facility] for afib.                  JB                   ------

## 2014-06-21 NOTE — Telephone Encounter (Signed)
Please see refill bin / tgs  °

## 2014-06-22 ENCOUNTER — Telehealth: Payer: Self-pay | Admitting: *Deleted

## 2014-06-22 NOTE — Telephone Encounter (Signed)
Approved for Eliquis 5 mg twice daily thru 06/23/2015. [id number]

## 2014-06-24 ENCOUNTER — Encounter (HOSPITAL_BASED_OUTPATIENT_CLINIC_OR_DEPARTMENT_OTHER): Payer: Medicare Other

## 2014-06-24 ENCOUNTER — Encounter (HOSPITAL_COMMUNITY): Payer: Self-pay

## 2014-06-24 ENCOUNTER — Encounter (HOSPITAL_COMMUNITY): Payer: Medicare Other | Attending: Hematology

## 2014-06-24 VITALS — BP 104/33 | HR 75 | Temp 98.2°F | Resp 16 | Wt 190.0 lb

## 2014-06-24 DIAGNOSIS — Z7901 Long term (current) use of anticoagulants: Secondary | ICD-10-CM | POA: Insufficient documentation

## 2014-06-24 DIAGNOSIS — C50911 Malignant neoplasm of unspecified site of right female breast: Secondary | ICD-10-CM

## 2014-06-24 DIAGNOSIS — M85861 Other specified disorders of bone density and structure, right lower leg: Secondary | ICD-10-CM | POA: Diagnosis not present

## 2014-06-24 DIAGNOSIS — I4891 Unspecified atrial fibrillation: Secondary | ICD-10-CM | POA: Diagnosis not present

## 2014-06-24 DIAGNOSIS — Z08 Encounter for follow-up examination after completed treatment for malignant neoplasm: Secondary | ICD-10-CM | POA: Diagnosis present

## 2014-06-24 DIAGNOSIS — Z853 Personal history of malignant neoplasm of breast: Secondary | ICD-10-CM

## 2014-06-24 DIAGNOSIS — N951 Menopausal and female climacteric states: Secondary | ICD-10-CM | POA: Insufficient documentation

## 2014-06-24 DIAGNOSIS — H409 Unspecified glaucoma: Secondary | ICD-10-CM | POA: Insufficient documentation

## 2014-06-24 DIAGNOSIS — Z79810 Long term (current) use of selective estrogen receptor modulators (SERMs): Secondary | ICD-10-CM | POA: Diagnosis not present

## 2014-06-24 DIAGNOSIS — Z923 Personal history of irradiation: Secondary | ICD-10-CM | POA: Diagnosis not present

## 2014-06-24 LAB — CBC WITH DIFFERENTIAL/PLATELET
BASOS ABS: 0 10*3/uL (ref 0.0–0.1)
BASOS PCT: 1 % (ref 0–1)
Eosinophils Absolute: 0.2 10*3/uL (ref 0.0–0.7)
Eosinophils Relative: 3 % (ref 0–5)
HCT: 39.4 % (ref 36.0–46.0)
HEMOGLOBIN: 13.6 g/dL (ref 12.0–15.0)
Lymphocytes Relative: 37 % (ref 12–46)
Lymphs Abs: 2.7 10*3/uL (ref 0.7–4.0)
MCH: 31.1 pg (ref 26.0–34.0)
MCHC: 34.5 g/dL (ref 30.0–36.0)
MCV: 90.2 fL (ref 78.0–100.0)
MONOS PCT: 11 % (ref 3–12)
Monocytes Absolute: 0.8 10*3/uL (ref 0.1–1.0)
NEUTROS ABS: 3.6 10*3/uL (ref 1.7–7.7)
NEUTROS PCT: 48 % (ref 43–77)
Platelets: 200 10*3/uL (ref 150–400)
RBC: 4.37 MIL/uL (ref 3.87–5.11)
RDW: 12.5 % (ref 11.5–15.5)
WBC: 7.3 10*3/uL (ref 4.0–10.5)

## 2014-06-24 LAB — COMPREHENSIVE METABOLIC PANEL
ALBUMIN: 4 g/dL (ref 3.5–5.2)
ALK PHOS: 65 U/L (ref 39–117)
ALT: 40 U/L — AB (ref 0–35)
AST: 33 U/L (ref 0–37)
Anion gap: 16 — ABNORMAL HIGH (ref 5–15)
BILIRUBIN TOTAL: 0.3 mg/dL (ref 0.3–1.2)
BUN: 16 mg/dL (ref 6–23)
CHLORIDE: 100 meq/L (ref 96–112)
CO2: 22 mEq/L (ref 19–32)
Calcium: 9.6 mg/dL (ref 8.4–10.5)
Creatinine, Ser: 0.77 mg/dL (ref 0.50–1.10)
GFR calc Af Amer: >90 mL/min (ref >90)
GFR calc non Af Amer: 84 mL/min — ABNORMAL LOW (ref >90)
Glucose, Bld: 118 mg/dL — ABNORMAL HIGH (ref 70–99)
POTASSIUM: 4.3 meq/L (ref 3.7–5.3)
Sodium: 138 mEq/L (ref 137–147)
Total Protein: 7.3 g/dL (ref 6.0–8.3)

## 2014-06-24 NOTE — Patient Instructions (Signed)
[facility] Discharge Instructions  RECOMMENDATIONS MADE BY THE CONSULTANT AND ANY TEST RESULTS WILL BE SENT TO YOUR REFERRING PHYSICIAN.  EXAM FINDINGS BY THE PHYSICIAN TODAY AND SIGNS OR SYMPTOMS TO REPORT TO CLINIC OR PRIMARY PHYSICIAN: Exam and findings as discussed by Dr.Heller.  MEDICATIONS PRESCRIBED:  Continue as prescribed.  INSTRUCTIONS/FOLLOW-UP: Return as scheduled. MD appointment with lab work in 6 months. Report any issues/concerns as needed to clinic prior to appointment.  Thank you for choosing [facility] to provide your oncology and hematology care.  To afford each patient quality time with our providers, please arrive at least 15 minutes before your scheduled appointment time.  With your help, our goal is to use those 15 minutes to complete the necessary work-up to ensure our physicians have the information they need to help with your evaluation and healthcare recommendations.    Effective January 1st, 2014, we ask that you re-schedule your appointment with our physicians should you arrive 10 or more minutes late for your appointment.  We strive to give you quality time with our providers, and arriving late affects you and other patients whose appointments are after yours.    Again, thank you for choosing [facility]  Our hope is that these requests will decrease the amount of time that you wait before being seen by our physicians.       _____________________________________________________________  Should you have questions after your visit to [facility], please contact our office at [phone] between the hours of 8:30 a.m. and 4:30 p.m.  Voicemails left after 4:30 p.m. will not be returned until the following business day.  For prescription refill requests, have your pharmacy contact our office with your prescription refill request.    _______________________________________________________________  We  hope that we have given you very good care.  You may receive a patient satisfaction survey in the mail, please complete it and return it as soon as possible.  We value your feedback!  _______________________________________________________________  Have you asked about our STAR program?  STAR stands for Survivorship Training and Rehabilitation, and this is a nationally recognized cancer care program that focuses on survivorship and rehabilitation.  Cancer and cancer treatments may cause problems, such as, pain, making you feel tired and keeping you from doing the things that you need or want to do. Cancer rehabilitation can help. Our goal is to reduce these troubling effects and help you have the best quality of life possible.  You may receive a survey from a nurse that asks questions about your current state of health.  Based on the survey results, all eligible patients will be referred to the [facility] program for an evaluation so we can better serve you!  A frequently asked questions sheet is available upon request.

## 2014-06-24 NOTE — Progress Notes (Signed)
LABS FOR CEA,[id number],CBCD,CMP 

## 2014-06-25 LAB — CEA: CEA: 1.8 ng/mL (ref 0.0–5.0)

## 2014-06-25 LAB — CANCER ANTIGEN 27.29: CA 27.29: 37 U/mL (ref 0–39)

## 2014-06-27 NOTE — Progress Notes (Signed)
[facility] OFFICE PROGRESS NOTE  PCP [name], MD [address] [place] [address]  DIAGNOSIS: History of breast cancer, bilateral: Right T1N0 Lumpectomy July 2010, Left T1c, N0 Lumpectomy 04/26/2010. - Plan: CBC with Differential, Comprehensive metabolic panel, CEA, Cancer antigen 27.29  CURRENT THERAPY:  Tamoxifen $RemoveBe'10mg'NHLysWboq$ . daily  INTERVAL HEMATOLOGY/ONCOLOGY HX: Heidi Lopez is a pleasant post menopausal lady who returns to the clinic for a routine 6 month appointment regarding her history of bilateral invasive breast cancers. She was initially diagnosed with cancer in the right breast and elected conservation therapy performed in July 2010. A lumpectomy for a left breast cancer was performed on 04/26/2010. Both cancers were node negative and hormone receptor positive. HER-2neu was not amplified and Heidi Lopez received adjuvant antiestrogen treatment with tamoxifen. She was unable to tolerate Aromasin. She did undergo external beam radiation therapy. Her bone density in 2013 showed osteopenia of the right femoral neck. Her colonoscopy showed diverticular disease. The patient reports vague left sided abdominal discomfort.    MEDICAL HISTORY:  Past Medical History  Diagnosis Date  . Cancer of breast July 2010  . Glaucoma   . Cramps, muscle, general     severe breast cramping   . Heart palpitations   . Night sweats   . Pain     breast pain  . Chronic coughing   . Sinus problem   . Wears glasses   . Glaucoma     left eye   . Joint pain     foot and knee pain  . Atrial fibrillation, new onset 05/2014    has History of breast cancer, bilateral: Right T1N0 Lumpectomy July 2010, Left T1c, N0 Lumpectomy 04/26/2010.; Pain in joint, ankle and foot; and A-fib on her problem list.    ALLERGIES:  is allergic to reglan.  MEDICATIONS: has a current medication list which includes the following prescription(s): albuterol, apixaban,  bimatoprost, brimonidine, calcium carbonate, cholecalciferol, co-enzyme q-10, cranberry, diltiazem, fexofenadine-pseudoephedrine, multivitamin, fish oil, and tamoxifen.  FAMILY HISTORY: family history includes Cancer in her father, sister, sister, and sister; Diabetes in her daughter; Early death in her brother; Heart disease in her mother; Stroke in her father.  REVIEW OF SYSTEMS:  Recorded by the nurse and charted in EPIC.   Pain Assessment Pain Score: 0-No pain  Other than that discussed above is noncontributory.    PHYSICAL EXAMINATION:   weight is 190 lb (86.183 kg). Her oral temperature is 98.2 F (36.8 C). Her blood pressure is 104/33 and her pulse is 75. Her respiration is 16 and oxygen saturation is 96%.    GENERAL:alert, no distress and comfortable EYES: PERRL, EOM intact. Conjunctiva are pink and sclera clear OROPHARYNX:no exudate, no erythema and tongue normal  NECK: supple, thyroid normal size, non-tender, without cervical or supraclavicular adenopathy CHEST/BREASTS: unremarkable bilaterally. No axillary adenopathy or lymphedema  LUNGS: clear to auscultation and percussion with normal breathing effort HEART: regular rate & rhythm and no murmurs, S3, or S4 ABDOMEN: soft, non-tender, normal bowel sounds;  Liver and spleen were nonpalpable; no inguinal adenopathy MUSCULOSKELETAL: Spine without localized tenderness. No edema   SKIN: no jaundice, bruises, or rashes. NEURO: alert & oriented x 3 with fluent speech, no focal motor/sensory deficits.      LABORATORY DATA: Lab on 06/24/2014  Component Date Value Ref Range Status  . CA 27.29 06/24/2014 37  0 - 39 U/mL Final   Performed at [facility]  . CEA  06/24/2014 1.8  0.0 - 5.0 ng/mL Final   Performed at [facility]  . WBC 06/24/2014 7.3  4.0 - 10.5 K/uL Final  . RBC 06/24/2014 4.37  3.87 - 5.11 MIL/uL Final  . Hemoglobin 06/24/2014 13.6  12.0 - 15.0 g/dL Final  . HCT 06/24/2014 39.4  36.0 - 46.0 %  Final  . MCV 06/24/2014 90.2  78.0 - 100.0 fL Final  . MCH 06/24/2014 31.1  26.0 - 34.0 pg Final  . MCHC 06/24/2014 34.5  30.0 - 36.0 g/dL Final  . RDW 06/24/2014 12.5  11.5 - 15.5 % Final  . Platelets 06/24/2014 200  150 - 400 K/uL Final  . Neutrophils Relative % 06/24/2014 48  43 - 77 % Final  . Neutro Abs 06/24/2014 3.6  1.7 - 7.7 K/uL Final  . Lymphocytes Relative 06/24/2014 37  12 - 46 % Final  . Lymphs Abs 06/24/2014 2.7  0.7 - 4.0 K/uL Final  . Monocytes Relative 06/24/2014 11  3 - 12 % Final  . Monocytes Absolute 06/24/2014 0.8  0.1 - 1.0 K/uL Final  . Eosinophils Relative 06/24/2014 3  0 - 5 % Final  . Eosinophils Absolute 06/24/2014 0.2  0.0 - 0.7 K/uL Final  . Basophils Relative 06/24/2014 1  0 - 1 % Final  . Basophils Absolute 06/24/2014 0.0  0.0 - 0.1 K/uL Final  . Sodium 06/24/2014 138  137 - 147 mEq/L Final  . Potassium 06/24/2014 4.3  3.7 - 5.3 mEq/L Final  . Chloride 06/24/2014 100  96 - 112 mEq/L Final  . CO2 06/24/2014 22  19 - 32 mEq/L Final  . Glucose, Bld 06/24/2014 118* 70 - 99 mg/dL Final  . BUN 06/24/2014 16  6 - 23 mg/dL Final  . Creatinine, Ser 06/24/2014 0.77  0.50 - 1.10 mg/dL Final  . Calcium 06/24/2014 9.6  8.4 - 10.5 mg/dL Final  . Total Protein 06/24/2014 7.3  6.0 - 8.3 g/dL Final  . Albumin 06/24/2014 4.0  3.5 - 5.2 g/dL Final  . AST 06/24/2014 33  0 - 37 U/L Final  . ALT 06/24/2014 40* 0 - 35 U/L Final  . Alkaline Phosphatase 06/24/2014 65  39 - 117 U/L Final  . Total Bilirubin 06/24/2014 0.3  0.3 - 1.2 mg/dL Final  . GFR calc non Af Amer 06/24/2014 84* >90 mL/min Final  . GFR calc Af Amer 06/24/2014 >90  >90 mL/min Final   Comment: (NOTE)                          The eGFR has been calculated using the CKD EPI equation.                          This calculation has not been validated in all clinical situations.                          eGFR's persistently <90 mL/min signify possible Chronic Kidney                          Disease.  . Anion gap  06/24/2014 16* 5 - 15 Final    RADIOGRAPHIC STUDIES: Dg Chest 2 View  05/31/2014   CLINICAL DATA:  Cough, wheezing  EXAM: CHEST  2 VIEW  COMPARISON:  04/06/2011  FINDINGS: Cardiomediastinal silhouette is stable. No acute infiltrate or pleural  effusion. No pulmonary edema. Mild degenerative changes thoracic spine. Hyperinflation again noted.  IMPRESSION: No active disease.  Hyperinflation again noted.   Electronically Signed   By: Lahoma Crocker M.D.   On: 05/31/2014 15:37   EXAM:  DIGITAL DIAGNOSTIC BILATERAL MAMMOGRAM WITH CAD  COMPARISON: Prior exams  ACR Breast Density Category b: There are scattered areas of  fibroglandular density.  FINDINGS:  Postsurgical scarring is stable from the prior exams bilaterally.  There are no discrete masses or other areas of distortion. Stable  calcifications are noted medially on the left. There are no  suspicious or new calcifications.  Mammographic images were processed with CAD.  IMPRESSION:  No evidence of recurrent or new malignancy. Stable benign bilateral  postsurgical changes.  RECOMMENDATION:  Diagnostic mammography in 1 year per standard post lumpectomy  protocol.  I have discussed the findings and recommendations with the patient.  Results were also provided in writing at the conclusion of the  visit. If applicable, a reminder letter will be sent to the patient  regarding the next appointment.  BI-RADS CATEGORY 2: Benign Finding(s)  Electronically Signed  By: Lajean Manes M.D.  On: 04/19/2014 10:04  ASSESSMENT:   1. 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 Her-2neu negative. Receiving tamoxifen. Declines a trial of effexor for her hot flashes.  2. Multiple comorbidities      RECOMMENDATIONS/PLAN:   Continue reduced dose tamoxifen per Dr. Idamae Lusher suggestion. Patient is due for a bone density and will discuss with Dr. Willey Blade.   All  questions were answered. The patient knows to call the clinic with any problems, questions or concerns.    Darrall Dears, MD 06/27/2014 9:44 PM

## 2014-07-07 ENCOUNTER — Telehealth: Payer: Self-pay | Admitting: Cardiology

## 2014-07-07 NOTE — Telephone Encounter (Signed)
No extender tomorrow. Dr. Harl Bowie schedule for 940. Pt aware.

## 2014-07-07 NOTE — Telephone Encounter (Signed)
Can she be added on to the PA or NP schedule tomorrow in [place]. If no openings can put in my 940AM appointment slot   Zandra Abts MD

## 2014-07-07 NOTE — Telephone Encounter (Signed)
Patient having problems with feet swelling and thinks it may be related to new meds / tgs

## 2014-07-07 NOTE — Telephone Encounter (Signed)
Pt is having swelling in legs/feet with pain scale 5 HR running 152/112/84 today. Last night HR 104-135. Pt is concerned that is related to recent medication changes of increase of Cardizem. Will forward to Dr. Harl Bowie. Please advise

## 2014-07-08 ENCOUNTER — Telehealth (HOSPITAL_COMMUNITY): Payer: Self-pay | Admitting: *Deleted

## 2014-07-08 ENCOUNTER — Encounter: Payer: Self-pay | Admitting: Cardiology

## 2014-07-08 ENCOUNTER — Other Ambulatory Visit (HOSPITAL_COMMUNITY): Payer: Self-pay | Admitting: Oncology

## 2014-07-08 ENCOUNTER — Ambulatory Visit (INDEPENDENT_AMBULATORY_CARE_PROVIDER_SITE_OTHER): Payer: Medicare Other | Admitting: Cardiology

## 2014-07-08 VITALS — BP 130/78 | HR 82 | Ht 63.0 in | Wt 194.0 lb

## 2014-07-08 DIAGNOSIS — Z853 Personal history of malignant neoplasm of breast: Secondary | ICD-10-CM

## 2014-07-08 DIAGNOSIS — I4891 Unspecified atrial fibrillation: Secondary | ICD-10-CM

## 2014-07-08 MED ORDER — FUROSEMIDE 20 MG PO TABS: 20.0000 mg | ORAL_TABLET | Freq: Every day | ORAL | Status: DC | PRN

## 2014-07-08 MED ORDER — DILTIAZEM HCL ER COATED BEADS 240 MG PO CP24: 240.0000 mg | ORAL_CAPSULE | Freq: Every day | ORAL | Status: DC

## 2014-07-08 MED ORDER — METOPROLOL TARTRATE 25 MG PO TABS
25.0000 mg | ORAL_TABLET | Freq: Two times a day (BID) | ORAL | Status: DC
Start: 2014-07-08 — End: 2014-07-27

## 2014-07-08 NOTE — Patient Instructions (Signed)
Your physician recommends that you schedule a follow-up appointment in: as already planned      Your physician has recommended you make the following change in your medication:    DECREASE Diltiazem to 240 mg daily   START Lopressor 25 mg twice a day   Take Lasix 20 mg daily if needed for swelling        Thank you for choosing [facility] !

## 2014-07-08 NOTE — Telephone Encounter (Signed)
Cancer marker is WNL.  I have sent a message to Dr. Barnet Glasgow to see if there is anything he would recommend.

## 2014-07-08 NOTE — Progress Notes (Signed)
Clinical Summary Ms. Addis is a 68 y.o.female seen today for follow up of the following medical problems.   1. Afib  - she is on dilt for rate control, eliquis for stroke prevention - last visit increased dilt to 300mg  daily as she was still having some palpitations.  Since change she as continued to have palpitations, and also developed some LE edema.  - echo LVEF 27-78%, grade I diastolic dysfunction, mild LAE  - K 4.5, TSH 0.685.     2. Breast cancer  - followed by cancer center. S/p right and left lumpectomy. Currently on tamoxifen.  .   Past Medical History  Diagnosis Date  . Cancer of breast July 2010  . Glaucoma   . Cramps, muscle, general     severe breast cramping   . Heart palpitations   . Night sweats   . Pain     breast pain  . Chronic coughing   . Sinus problem   . Wears glasses   . Glaucoma     left eye   . Joint pain     foot and knee pain  . Atrial fibrillation, new onset 05/2014     Allergies  Allergen Reactions  . Reglan [Metoclopramide] Other (See Comments)    Causes severe jitters     Current Outpatient Prescriptions  Medication Sig Dispense Refill  . albuterol (PROVENTIL HFA;VENTOLIN HFA) 108 (90 BASE) MCG/ACT inhaler Inhale into the lungs every 6 (six) hours as needed for wheezing or shortness of breath.      Marland Kitchen apixaban (ELIQUIS) 5 MG TABS tablet Take 1 tablet (5 mg total) by mouth 2 (two) times daily.  180 tablet  3  . bimatoprost (LUMIGAN) 0.03 % ophthalmic solution Place 1 drop into both eyes at bedtime.      . brimonidine (ALPHAGAN P) 0.1 % SOLN       . calcium carbonate (OS-CAL) 600 MG TABS tablet Take 600 mg by mouth daily with breakfast.      . Cholecalciferol (VITAMIN D PO) Take 2,000 Units by mouth daily.       Marland Kitchen co-enzyme Q-10 50 MG capsule Take 200 mg by mouth daily.       . Cranberry 500 MG CAPS Take 500 mg by mouth daily.        Marland Kitchen diltiazem (CARDIZEM CD) 300 MG 24 hr capsule Take 1 capsule (300 mg total) by mouth daily.   90 capsule  3  . fexofenadine-pseudoephedrine (ALLEGRA-D) 60-120 MG per tablet Take 1 tablet by mouth as needed.      . Multiple Vitamin (MULTIVITAMIN) tablet Take 1 tablet by mouth daily. Calcium plus D      . Omega-3 Fatty Acids (FISH OIL) 1000 MG CAPS Take 1,200 mg by mouth daily.       . tamoxifen (NOLVADEX) 10 MG tablet Take 10 mg daily.  90 tablet  3   No current facility-administered medications for this visit.     Past Surgical History  Procedure Laterality Date  . Breast surgery  2010    for cancer  . Tubal ligation  1984  . Colonoscopy N/A 02/05/2013    Procedure: COLONOSCOPY;  Surgeon: [name], MD;  Location: [facility];  Service: Endoscopy;  Laterality: N/A;  830     Allergies  Allergen Reactions  . Reglan [Metoclopramide] Other (See Comments)    Causes severe jitters      Family History  Problem Relation Age of Onset  .  Heart disease Mother   . Stroke Father   . Cancer Father   . Early death Brother   . Cancer Sister   . Cancer Sister   . Cancer Sister   . Diabetes Daughter      Social History Ms. Hemrick reports that she quit smoking about 47 years ago. She has never used smokeless tobacco. Ms. Kooi reports that she does not drink alcohol.   Review of Systems CONSTITUTIONAL: No weight loss, fever, chills, weakness or fatigue.  HEENT: Eyes: No visual loss, blurred vision, double vision or yellow sclerae.No hearing loss, sneezing, congestion, runny nose or sore throat.  SKIN: No rash or itching.  CARDIOVASCULAR: per HPI RESPIRATORY: No shortness of breath, cough or sputum.  GASTROINTESTINAL: No anorexia, nausea, vomiting or diarrhea. No abdominal pain or blood.  GENITOURINARY: No burning on urination, no polyuria NEUROLOGICAL: No headache, dizziness, syncope, paralysis, ataxia, numbness or tingling in the extremities. No change in bowel or bladder control.  MUSCULOSKELETAL: Leg swelling LYMPHATICS: No enlarged nodes. No history of  splenectomy.  PSYCHIATRIC: No history of depression or anxiety.  ENDOCRINOLOGIC: No reports of sweating, cold or heat intolerance. No polyuria or polydipsia.  Marland Kitchen   Physical Examination p 82 bp 130/78 Wt 194 lbs BMI 34 Gen: resting comfortably, no acute distress HEENT: no scleral icterus, pupils equal round and reactive, no palptable cervical adenopathy,  CV: RRR, no m/r/g, no JVD, no carotid bruits Resp: Clear to auscultation bilaterally GI: abdomen is soft, non-tender, non-distended, normal bowel sounds, no hepatosplenomegaly MSK: extremities are warm, 1+ bilateral edema Skin: warm, no rash Neuro:  no focal deficits Psych: appropriate affect   Diagnostic Studies 06/04/14 Echo  Study Conclusions  - Left ventricle: The cavity size was normal. Systolic function was normal. The estimated ejection fraction was in the range of 55% to 60%. Wall motion was normal; there were no regional wall motion abnormalities. Doppler parameters are consistent with abnormal left ventricular relaxation (grade 1 diastolic dysfunction). Borderline concentric LVH. - Left atrium: The atrium was mildly dilated. - Tricuspid valve: There was mild regurgitation. - Pulmonary arteries: PA peak pressure: 32 mm Hg (S).      Assessment and Plan  1. Afib  - still with ongoing palpitations, describes + LE edema since recent increase in dilt - will decrease her dilt back to 240mg  daily, start lopressor 25mg  bid. Continue eliquis for stroke prevention - start lasix 20mg  prn swelling        Arnoldo Lenis, M.D.

## 2014-07-27 ENCOUNTER — Encounter: Payer: Self-pay | Admitting: Cardiology

## 2014-07-27 ENCOUNTER — Ambulatory Visit (INDEPENDENT_AMBULATORY_CARE_PROVIDER_SITE_OTHER): Payer: Medicare Other | Admitting: Cardiology

## 2014-07-27 VITALS — BP 123/80 | HR 69 | Ht 63.0 in | Wt 195.0 lb

## 2014-07-27 DIAGNOSIS — I4891 Unspecified atrial fibrillation: Secondary | ICD-10-CM

## 2014-07-27 MED ORDER — DIGOXIN 125 MCG PO TABS: 0.1250 mg | ORAL_TABLET | Freq: Every day | ORAL | Status: DC

## 2014-07-27 NOTE — Progress Notes (Signed)
Clinical Summary Heidi Lopez is a 68 y.o.female seen today for follow up of the following medical problems.   1. Afib  - recently cut back her dilt from 300mg  to 240mg , she had developed some LE edema after we had increased to 300mg  during a previous appointment. She was still having palpitations, so started lopressor 25mg  bid. She is on eliquis for stroke prevention  - echo LVEF 16-10%, grade I diastolic dysfunction, mild LAE  - K 4.5, TSH 0.685.   - since making change, no significant change in LE edema or palpitatoins. She reports the lopressor causes waves of fatigue to come on that she experiences 1-2 times a day, never had these episodes prior to starting lopressor.   Past Medical History  Diagnosis Date  . Cancer of breast July 2010  . Glaucoma   . Cramps, muscle, general     severe breast cramping   . Heart palpitations   . Night sweats   . Pain     breast pain  . Chronic coughing   . Sinus problem   . Wears glasses   . Glaucoma     left eye   . Joint pain     foot and knee pain  . Atrial fibrillation, new onset 05/2014     Allergies  Allergen Reactions  . Reglan [Metoclopramide] Other (See Comments)    Causes severe jitters     Current Outpatient Prescriptions  Medication Sig Dispense Refill  . albuterol (PROVENTIL HFA;VENTOLIN HFA) 108 (90 BASE) MCG/ACT inhaler Inhale into the lungs every 6 (six) hours as needed for wheezing or shortness of breath.    Marland Kitchen apixaban (ELIQUIS) 5 MG TABS tablet Take 1 tablet (5 mg total) by mouth 2 (two) times daily. 180 tablet 3  . bimatoprost (LUMIGAN) 0.03 % ophthalmic solution Place 1 drop into both eyes at bedtime.    . brimonidine (ALPHAGAN P) 0.1 % SOLN     . calcium carbonate (OS-CAL) 600 MG TABS tablet Take 600 mg by mouth daily with breakfast.    . Cholecalciferol (VITAMIN D PO) Take 2,000 Units by mouth daily.     Marland Kitchen co-enzyme Q-10 50 MG capsule Take 200 mg by mouth daily.     . Cranberry 500 MG CAPS Take 500 mg  by mouth daily.      Marland Kitchen diltiazem (CARDIZEM CD) 240 MG 24 hr capsule Take 1 capsule (240 mg total) by mouth daily. 90 capsule 3  . fexofenadine-pseudoephedrine (ALLEGRA-D) 60-120 MG per tablet Take 1 tablet by mouth as needed.    . furosemide (LASIX) 20 MG tablet Take 1 tablet (20 mg total) by mouth daily as needed. Take daily as needed for swelling 90 tablet 3  . metoprolol tartrate (LOPRESSOR) 25 MG tablet Take 1 tablet (25 mg total) by mouth 2 (two) times daily. 180 tablet 3  . Multiple Vitamin (MULTIVITAMIN) tablet Take 1 tablet by mouth daily. Calcium plus D    . Omega-3 Fatty Acids (FISH OIL) 1000 MG CAPS Take 1,200 mg by mouth daily.     . tamoxifen (NOLVADEX) 10 MG tablet Take 10 mg daily. 90 tablet 3   No current facility-administered medications for this visit.     Past Surgical History  Procedure Laterality Date  . Breast surgery  2010    for cancer  . Tubal ligation  1984  . Colonoscopy N/A 02/05/2013    Procedure: COLONOSCOPY;  Surgeon: [name], MD;  Location: [facility];  Service: Endoscopy;  Laterality: N/A;  830     Allergies  Allergen Reactions  . Reglan [Metoclopramide] Other (See Comments)    Causes severe jitters      Family History  Problem Relation Age of Onset  . Heart disease Mother   . Stroke Father   . Cancer Father   . Early death Brother   . Cancer Sister   . Cancer Sister   . Cancer Sister   . Diabetes Daughter      Social History Ms. Melin reports that she quit smoking about 47 years ago. She has never used smokeless tobacco. Ms. Poirier reports that she does not drink alcohol.   Review of Systems CONSTITUTIONAL: episodes of fatigue.  HEENT: Eyes: No visual loss, blurred vision, double vision or yellow sclerae.No hearing loss, sneezing, congestion, runny nose or sore throat.  SKIN: No rash or itching.  CARDIOVASCULAR: per HPI RESPIRATORY: No shortness of breath, cough or sputum.  GASTROINTESTINAL: No anorexia, nausea,  vomiting or diarrhea. No abdominal pain or blood.  GENITOURINARY: No burning on urination, no polyuria NEUROLOGICAL: No headache, dizziness, syncope, paralysis, ataxia, numbness or tingling in the extremities. No change in bowel or bladder control.  MUSCULOSKELETAL: No muscle, back pain, joint pain or stiffness.  LYMPHATICS: No enlarged nodes. No history of splenectomy.  PSYCHIATRIC: No history of depression or anxiety.  ENDOCRINOLOGIC: No reports of sweating, cold or heat intolerance. No polyuria or polydipsia.  Marland Kitchen   Physical Examination p 69 bp 123/80 Wt 195 lbs BMI 35 Gen: resting comfortably, no acute distress HEENT: no scleral icterus, pupils equal round and reactive, no palptable cervical adenopathy,  CV: RRR, no m/r/g, no JVD, no carotid bruits Resp: Clear to auscultation bilaterally GI: abdomen is soft, non-tender, non-distended, normal bowel sounds, no hepatosplenomegaly MSK: extremities are warm, no edema.  Skin: warm, no rash Neuro:  no focal deficits Psych: appropriate affect   Diagnostic Studies 06/04/14 Echo  Study Conclusions  - Left ventricle: The cavity size was normal. Systolic function was normal. The estimated ejection fraction was in the range of 55% to 60%. Wall motion was normal; there were no regional wall motion abnormalities. Doppler parameters are consistent with abnormal left ventricular relaxation (grade 1 diastolic dysfunction). Borderline concentric LVH. - Left atrium: The atrium was mildly dilated. - Tricuspid valve: There was mild regurgitation. - Pulmonary arteries: PA peak pressure: 32 mm Hg (S).    Assessment and Plan   1. Afib  - still with some LE edema after decreasing dilt to 240mg  last visit, she describes fatigue on low dose lopressor - rate control strategy becoming difficult due to medication side effects, will stop lopressor and try digoxin 0.125 mg daily. If not effective consider EP referral for consideration for antiarrhythmic  strategy.  - counseled to increase lasix to 40mg  prn, unclear if LE edema related to dilt or afib with high rates in setting of diastolic dysfunction   F/u 3 weeks      Arnoldo Lenis, M.D.

## 2014-07-27 NOTE — Patient Instructions (Signed)
Your physician recommends that you schedule a follow-up appointment in: 3 weeks with Dr. Harl Bowie  Your physician has recommended you make the following change in your medication:   STOP METOPROLOL  START DIGOXIN 0.125 MG DAILY  Thank you for choosing [facility]!!

## 2014-08-09 ENCOUNTER — Encounter (HOSPITAL_COMMUNITY): Payer: Medicare Other | Attending: Hematology

## 2014-08-09 ENCOUNTER — Encounter (HOSPITAL_COMMUNITY): Payer: Self-pay

## 2014-08-09 DIAGNOSIS — Z853 Personal history of malignant neoplasm of breast: Secondary | ICD-10-CM | POA: Diagnosis not present

## 2014-08-09 LAB — CEA: CEA: 1.7 ng/mL (ref 0.0–5.0)

## 2014-08-09 LAB — CANCER ANTIGEN 27.29: CA 27.29: 28 U/mL (ref 0–39)

## 2014-08-09 NOTE — Progress Notes (Signed)
Labs for [id number],cea

## 2014-08-31 ENCOUNTER — Ambulatory Visit (INDEPENDENT_AMBULATORY_CARE_PROVIDER_SITE_OTHER): Payer: Medicare Other | Admitting: Cardiology

## 2014-08-31 ENCOUNTER — Encounter: Payer: Self-pay | Admitting: Cardiology

## 2014-08-31 VITALS — BP 110/67 | HR 75 | Ht 63.0 in | Wt 193.0 lb

## 2014-08-31 DIAGNOSIS — I4891 Unspecified atrial fibrillation: Secondary | ICD-10-CM

## 2014-08-31 NOTE — Progress Notes (Signed)
Patient ID: Heidi Lopez, female   DOB: 01/24/1946, 68 y.o.   MRN: [id number]     Clinical Summary Heidi Lopez is a 68 y.o.female seen today for follow up of the following medical problems.   1. Afib  - recently cut back her dilt from 300mg  to 240mg , she had developed some LE edema after we had increased to 300mg  during a previous appointment. She was still having palpitations, so started lopressor 25mg  bid. On lopressor described severe waves of fatigue. Lopressor stopped last visit with resolution of symptoms, started on digoxin 0.125mg  daily. She is on eliquis for stroke prevention  - echo LVEF 82-50%, grade I diastolic dysfunction, mild LAE  - K 4.5, TSH 0.685.  - since starting digoxin continues to have episodes of palpitations, can last up to one hour, mainly occur at night.    Past Medical History  Diagnosis Date  . Cancer of breast July 2010  . Glaucoma   . Cramps, muscle, general     severe breast cramping   . Heart palpitations   . Night sweats   . Pain     breast pain  . Chronic coughing   . Sinus problem   . Wears glasses   . Glaucoma     left eye   . Joint pain     foot and knee pain  . Atrial fibrillation, new onset 05/2014     Allergies  Allergen Reactions  . Reglan [Metoclopramide] Other (See Comments)    Causes severe jitters     Current Outpatient Prescriptions  Medication Sig Dispense Refill  . albuterol (PROVENTIL HFA;VENTOLIN HFA) 108 (90 BASE) MCG/ACT inhaler Inhale into the lungs every 6 (six) hours as needed for wheezing or shortness of breath.    Marland Kitchen apixaban (ELIQUIS) 5 MG TABS tablet Take 1 tablet (5 mg total) by mouth 2 (two) times daily. 180 tablet 3  . bimatoprost (LUMIGAN) 0.03 % ophthalmic solution Place 1 drop into both eyes at bedtime.    . brimonidine (ALPHAGAN P) 0.1 % SOLN     . calcium carbonate (OS-CAL) 600 MG TABS tablet Take 600 mg by mouth daily with breakfast.    . Cholecalciferol (VITAMIN D PO) Take 2,000 Units by mouth  daily.     Marland Kitchen co-enzyme Q-10 50 MG capsule Take 200 mg by mouth daily.     . Cranberry 500 MG CAPS Take 500 mg by mouth daily.      . digoxin (LANOXIN) 0.125 MG tablet Take 1 tablet (0.125 mg total) by mouth daily. 30 tablet 1  . diltiazem (CARDIZEM CD) 240 MG 24 hr capsule Take 1 capsule (240 mg total) by mouth daily. 90 capsule 3  . fexofenadine-pseudoephedrine (ALLEGRA-D) 60-120 MG per tablet Take 1 tablet by mouth as needed.    . furosemide (LASIX) 20 MG tablet Take 1 tablet (20 mg total) by mouth daily as needed. Take daily as needed for swelling 90 tablet 3  . Multiple Vitamin (MULTIVITAMIN) tablet Take 1 tablet by mouth daily. Calcium plus D    . Omega-3 Fatty Acids (FISH OIL) 1000 MG CAPS Take 1,200 mg by mouth daily.     . tamoxifen (NOLVADEX) 10 MG tablet Take 10 mg daily. 90 tablet 3   No current facility-administered medications for this visit.     Past Surgical History  Procedure Laterality Date  . Breast surgery  2010    for cancer  . Tubal ligation  1984  . Colonoscopy N/A 02/05/2013  Procedure: COLONOSCOPY;  Surgeon: Heidi Houston, MD;  Location: [facility];  Service: Endoscopy;  Laterality: N/A;  830     Allergies  Allergen Reactions  . Reglan [Metoclopramide] Other (See Comments)    Causes severe jitters      Family History  Problem Relation Age of Onset  . Heart disease Mother   . Stroke Father   . Cancer Father   . Early death Brother   . Cancer Sister   . Cancer Sister   . Cancer Sister   . Diabetes Daughter      Social History Heidi Lopez reports that she quit smoking about 47 years ago. Her smoking use included Cigarettes. She started smoking about 51 years ago. She smoked 1.00 pack per day. She has never used smokeless tobacco. Heidi Lopez reports that she does not drink alcohol.   Review of Systems CONSTITUTIONAL: No weight loss, fever, chills, weakness or fatigue.  HEENT: Eyes: No visual loss, blurred vision, double vision or yellow  sclerae.No hearing loss, sneezing, congestion, runny nose or sore throat.  SKIN: No rash or itching.  CARDIOVASCULAR: per HPI RESPIRATORY: No shortness of breath, cough or sputum.  GASTROINTESTINAL: No anorexia, nausea, vomiting or diarrhea. No abdominal pain or blood.  GENITOURINARY: No burning on urination, no polyuria NEUROLOGICAL: No headache, dizziness, syncope, paralysis, ataxia, numbness or tingling in the extremities. No change in bowel or bladder control.  MUSCULOSKELETAL: No muscle, back pain, joint pain or stiffness.  LYMPHATICS: No enlarged nodes. No history of splenectomy.  PSYCHIATRIC: No history of depression or anxiety.  ENDOCRINOLOGIC: No reports of sweating, cold or heat intolerance. No polyuria or polydipsia.  Marland Kitchen   Physical Examination p 75 bp 110/67 Wt 193 lbs BMI 34 Gen: resting comfortably, no acute distress HEENT: no scleral icterus, pupils equal round and reactive, no palptable cervical adenopathy,  CV: RRR, no m/r/g, no JVD, no carotid bruits Resp: Clear to auscultation bilaterally GI: abdomen is soft, non-tender, non-distended, normal bowel sounds, no hepatosplenomegaly MSK: extremities are warm, no edema.  Skin: warm, no rash Neuro:  no focal deficits Psych: appropriate affect   Diagnostic Studies 06/04/14 Echo  Study Conclusions  - Left ventricle: The cavity size was normal. Systolic function was normal. The estimated ejection fraction was in the range of 55% to 60%. Wall motion was normal; there were no regional wall motion abnormalities. Doppler parameters are consistent with abnormal left ventricular relaxation (grade 1 diastolic dysfunction). Borderline concentric LVH. - Left atrium: The atrium was mildly dilated. - Tricuspid valve: There was mild regurgitation. - Pulmonary arteries: PA peak pressure: 32 mm Hg (S).     Assessment and Plan  1. Afib  - continued symptoms. Not able to tolerate higher doses of dilt. Did not tolerate  metoprolol. On dilt and dig with continued symptoms. Will refer to EP for consideration of antiarrhythmic strategy as rate control currently unsuccesful - continue eliquis for stroke prevention   F/u 6 months       Arnoldo Lenis, M.D.

## 2014-08-31 NOTE — Patient Instructions (Signed)
Your physician wants you to follow-up in: 6 months. You will receive a reminder letter in the mail two months in advance. If you don't receive a letter, please call our office to schedule the follow-up appointment. You have been referred to Dr.Alllred

## 2014-09-13 ENCOUNTER — Encounter (HOSPITAL_COMMUNITY): Payer: Medicare Other | Attending: Hematology & Oncology

## 2014-09-13 DIAGNOSIS — Z853 Personal history of malignant neoplasm of breast: Secondary | ICD-10-CM | POA: Insufficient documentation

## 2014-09-13 DIAGNOSIS — C50911 Malignant neoplasm of unspecified site of right female breast: Secondary | ICD-10-CM

## 2014-09-13 LAB — CANCER ANTIGEN 27.29: CA 27.29: 30 U/mL (ref 0–39)

## 2014-09-13 LAB — CEA: CEA: 1.5 ng/mL (ref 0.0–5.0)

## 2014-09-13 NOTE — Progress Notes (Signed)
Lab draw

## 2014-09-20 ENCOUNTER — Encounter: Payer: Self-pay | Admitting: Internal Medicine

## 2014-09-20 ENCOUNTER — Ambulatory Visit (INDEPENDENT_AMBULATORY_CARE_PROVIDER_SITE_OTHER): Payer: Medicare Other | Admitting: Internal Medicine

## 2014-09-20 VITALS — BP 134/78 | HR 76 | Ht 63.5 in | Wt 192.0 lb

## 2014-09-20 DIAGNOSIS — I4891 Unspecified atrial fibrillation: Secondary | ICD-10-CM

## 2014-09-20 DIAGNOSIS — R0683 Snoring: Secondary | ICD-10-CM

## 2014-09-20 MED ORDER — FLECAINIDE ACETATE 50 MG PO TABS: 50.0000 mg | ORAL_TABLET | Freq: Two times a day (BID) | ORAL | Status: DC

## 2014-09-20 NOTE — Patient Instructions (Signed)
Your physician has recommended you make the following change in your medication:  1) Start Flecainide 50 mg one tablet by mouth twice daily  Your physician recommends that you schedule a follow-up appointment in: 4 weeks with Roderic Palau, NP in the A-fib clinic.

## 2014-09-20 NOTE — Progress Notes (Signed)
Primary Care Physician: [name], MD Referring Physician:  Dr Doug Sou is a 69 y.o. female with a h/o recently diagnosed atrial fibrillation who presents for EP consultation.  She has a h/o breast CA and underwent XRT in 2010 and again in 2011.  She reports that her symptoms of AF began in 2011 after her XRT.  She has tachypalpitations which are more prominent at night.  Episodes typically last several minutes at a time.  She has episodes most evenings.  She has been evaluated by Dr Harl Bowie and found to have afib with RVR as the cause.  She was been placed on diltiazem.  Dose titration has been limited by edema.  She is also on digoxin.  She has not tried AAD therapy. Her chads2vasc score is at least 2.  She is anticoagulated with eliquis.  She is tolerating this without difficulty.  She is unaware of triggers or precipitants for her afib.     Today, she denies symptoms of  chest pain,   orthopnea, PND, lower extremity edema, dizziness, presyncope, syncope, or neurologic sequela.  She has frequent URI symptoms with associated SOB.  The patient is tolerating medications without difficulties and is otherwise without complaint today.   Past Medical History  Diagnosis Date  . Cancer of breast July 2010    s/p XRT 2010 and again in 2011  . Glaucoma   . Cramps, muscle, general     severe breast cramping   . Night sweats   . Pain     breast pain  . Chronic coughing   . Sinus problem   . Wears glasses   . Glaucoma     left eye   . Joint pain     foot and knee pain  . Paroxysmal atrial fibrillation 05/2014   Past Surgical History  Procedure Laterality Date  . Breast surgery  2010    for cancer  . Tubal ligation  1984  . Colonoscopy N/A 02/05/2013    Procedure: COLONOSCOPY;  Surgeon: [name], MD;  Location: [facility];  Service: Endoscopy;  Laterality: N/A;  830    Current Outpatient Prescriptions  Medication Sig Dispense Refill  . albuterol (PROVENTIL  HFA;VENTOLIN HFA) 108 (90 BASE) MCG/ACT inhaler Inhale into the lungs every 6 (six) hours as needed for wheezing or shortness of breath.    Marland Kitchen apixaban (ELIQUIS) 5 MG TABS tablet Take 1 tablet (5 mg total) by mouth 2 (two) times daily. 180 tablet 3  . brimonidine (ALPHAGAN) 0.2 % ophthalmic solution Place 1 drop into the left eye 2 (two) times daily.     . Calcium Carbonate-Vit D-Min (CALTRATE 600+D PLUS PO) Take 1 tablet by mouth daily.    . Cholecalciferol (VITAMIN D PO) Take 2,000 Units by mouth daily.     . Coenzyme Q10 (COQ-10) 200 MG CAPS Take 1 capsule by mouth daily.    . Cranberry 500 MG CAPS Take 500 mg by mouth daily.      . digoxin (LANOXIN) 0.125 MG tablet Take 1 tablet (0.125 mg total) by mouth daily. 30 tablet 1  . diltiazem (CARDIZEM CD) 240 MG 24 hr capsule Take 1 capsule (240 mg total) by mouth daily. 90 capsule 3  . fexofenadine-pseudoephedrine (ALLEGRA-D) 60-120 MG per tablet Take 1 tablet by mouth daily as needed (allergies).     . furosemide (LASIX) 20 MG tablet Take 1 tablet (20 mg total) by mouth daily as needed. Take daily as needed for  swelling 90 tablet 3  . LUMIGAN 0.01 % SOLN Place 1 drop into both eyes at bedtime.    . Multiple Vitamin (MULTIVITAMIN) tablet Take 1 tablet by mouth daily. Calcium plus D    . Omega-3 Fatty Acids (FISH OIL) 1200 MG CAPS Take 1 capsule by mouth daily.    . tamoxifen (NOLVADEX) 10 MG tablet Take 10 mg daily. (Patient taking differently: Take 10 mg by mouth daily.) 90 tablet 3  . flecainide (TAMBOCOR) 50 MG tablet Take 1 tablet (50 mg total) by mouth 2 (two) times daily. 60 tablet 6   No current facility-administered medications for this visit.    Allergies  Allergen Reactions  . Reglan [Metoclopramide] Other (See Comments)    Causes severe jitters    History   Social History  . Marital Status: Widowed    Spouse Name: N/A    Number of Children: N/A  . Years of Education: N/A   Occupational History  . Not on file.   Social  History Main Topics  . Smoking status: Former Smoker -- 1.00 packs/day for 2 years    Types: Cigarettes    Start date: 09/17/1962    Quit date: 11/16/1966  . Smokeless tobacco: Never Used  . Alcohol Use: No  . Drug Use: No  . Sexual Activity: Not on file   Other Topics Concern  . Not on file   Social History Narrative   Lives alone in [place]   Retired   Worked previously for [employer] and [employer]    Family History  Problem Relation Age of Onset  . Heart disease Mother   . Stroke Father   . Cancer Father   . Early death Brother   . Cancer Sister   . Cancer Sister   . Cancer Sister   . Diabetes Daughter     ROS- All systems are reviewed and negative except as per the HPI above  Physical Exam: Filed Vitals:   09/20/14 1543  BP: 134/78  Pulse: 76  Height: 5' 3.5" (1.613 m)  Weight: 192 lb (87.091 kg)    GEN- The patient is well appearing, alert and oriented x 3 today.   Head- normocephalic, atraumatic Eyes-  Sclera clear, conjunctiva pink Ears- hearing intact Oropharynx- clear Neck- supple, no JVP Lymph- no cervical lymphadenopathy Lungs- Clear to ausculation bilaterally, normal work of breathing Heart- Regular rate and rhythm, no murmurs, rubs or gallops, PMI not laterally displaced GI- soft, NT, ND, + BS Extremities- no clubbing, cyanosis, or edema MS- no significant deformity or atrophy Skin- no rash or lesion Psych- euthymic mood, full affect Neuro- strength and sensation are intact  EKG today reveals sinus rhythm 76 bpm, PR 172, QRS 88, otherwise normal ekg Echo is reviewed Epic records including Dr Nelly Laurence notes are reviewed  Assessment and Plan:  1. 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  She has not tried AADs.  I have advised flecainide and will start flecainide 50mg  BID.  She will follow-up with Roderic Palau NP in the AF clinic in 4 weeks.  If her AF is not well controlled, she may  require dose titration.  If her Af is improved then she should have plain treadmill test ordered at that time. Continue eliquis and diltiazem Once AF is improved, I would like to stop digoxin which has been shown to have increased mortality in AF patients. Could also consider decreasing diltiazem at some point due to edema  2.  Snoring She is overweight and thinks that she snores I have advised sleep study which she declines at this time  Follow-up with Roderic Palau NP in the AF clinic in 4 weeks Once her AF is better controlled, we will return her care in its entirety to Dr Harl Bowie.

## 2014-09-24 ENCOUNTER — Other Ambulatory Visit: Payer: Self-pay | Admitting: *Deleted

## 2014-09-24 MED ORDER — DIGOXIN 125 MCG PO TABS: 0.1250 mg | ORAL_TABLET | Freq: Every day | ORAL | Status: DC

## 2014-09-27 ENCOUNTER — Other Ambulatory Visit: Payer: Self-pay | Admitting: Cardiology

## 2014-09-27 NOTE — Telephone Encounter (Signed)
Received fax refill request  Rx # [id number] Medication:  Lanoxin 0.125 mg tab Qty 30 Sig:  Take one tablet by mouth once daily Physician:  Harl Bowie

## 2014-09-30 ENCOUNTER — Encounter: Payer: Self-pay | Admitting: Nurse Practitioner

## 2014-09-30 ENCOUNTER — Ambulatory Visit (INDEPENDENT_AMBULATORY_CARE_PROVIDER_SITE_OTHER): Payer: Medicare Other | Admitting: Nurse Practitioner

## 2014-09-30 ENCOUNTER — Telehealth: Payer: Self-pay | Admitting: Internal Medicine

## 2014-09-30 VITALS — BP 122/70 | HR 68 | Ht 63.0 in | Wt 193.4 lb

## 2014-09-30 DIAGNOSIS — I4891 Unspecified atrial fibrillation: Secondary | ICD-10-CM

## 2014-09-30 DIAGNOSIS — I480 Paroxysmal atrial fibrillation: Secondary | ICD-10-CM

## 2014-09-30 MED ORDER — FLECAINIDE ACETATE 100 MG PO TABS: 100.0000 mg | ORAL_TABLET | Freq: Two times a day (BID) | ORAL | Status: DC

## 2014-09-30 NOTE — Telephone Encounter (Signed)
Patient is going to come in today and be seen by Roderic Palau, NP at [facility]

## 2014-09-30 NOTE — Patient Instructions (Signed)
Your physician recommends that you return for lab work : Monday 10/04/14 at 10:00 AM  Your physician has recommended you make the following change in your medication:  1) increase flecainide to 100 mg one tablet by mouth twice daily  Your physician recommends that you schedule a follow-up appointment in: 2 weeks with Roderic Palau, NP in the [facility]. We will call you to schedule this.

## 2014-09-30 NOTE — Telephone Encounter (Signed)
New problem    Pt stated the medication Flecainide 50mg  isn't working for her and she need to speak to nurse.

## 2014-10-01 ENCOUNTER — Encounter: Payer: Self-pay | Admitting: Nurse Practitioner

## 2014-10-01 NOTE — Progress Notes (Signed)
Primary Care Physician: [name], MD Referring Physician:  Dr Doug Sou is a 69 y.o. female with a h/o recently diagnosed atrial fibrillation who presents for urgent EP evaluation..  She has a h/o breast CA and underwent XRT in 2010 and again in 2011.  She reports that her symptoms of AF began in 2011 after her XRT.  She has tachypalpitations which are more prominent at night.  Episodes typically last several minutes at a time.  She has episodes most evenings.  She has been evaluated by Dr Harl Bowie and found to have afib with RVR as the cause.  She was been placed on diltiazem.  Dose titration has been limited by edema.  She is also on digoxin.   Her chads2vasc score is at least 2.  She is anticoagulated with eliquis.  She is tolerating this without difficulty.  She is unaware of triggers or precipitants for her afib.    When seen by Dr. [name] 1/4 she was started on Flecainide 50 mg bid.  She asked to be seen today because she has not seen any improvement in afib symptoms and actually thinks episodes may be worse. Has tolerated flecainide without any side effects. Has some snoring history but believes she wakes well rested and denies daytime somnolence. She was very concerned thinking she was at risk of stroke with every episode of afib, but explained that if she takes blood thinner as directed she is protected.  Today, she denies symptoms of  chest pain,   orthopnea, PND, lower extremity edema, dizziness, presyncope, syncope, or neurologic sequela.The patient is tolerating medications without difficulties and is otherwise without complaint today.   Past Medical History  Diagnosis Date  . Cancer of breast July 2010    s/p XRT 2010 and again in 2011  . Glaucoma   . Cramps, muscle, general     severe breast cramping   . Night sweats   . Pain     breast pain  . Chronic coughing   . Sinus problem   . Wears glasses   . Glaucoma     left eye   . Joint pain     foot and  knee pain  . Paroxysmal atrial fibrillation 05/2014   Past Surgical History  Procedure Laterality Date  . Breast surgery  2010    for cancer  . Tubal ligation  1984  . Colonoscopy N/A 02/05/2013    Procedure: COLONOSCOPY;  Surgeon: [name], MD;  Location: [facility];  Service: Endoscopy;  Laterality: N/A;  830    Current Outpatient Prescriptions  Medication Sig Dispense Refill  . albuterol (PROVENTIL HFA;VENTOLIN HFA) 108 (90 BASE) MCG/ACT inhaler Inhale into the lungs every 6 (six) hours as needed for wheezing or shortness of breath.    Marland Kitchen apixaban (ELIQUIS) 5 MG TABS tablet Take 1 tablet (5 mg total) by mouth 2 (two) times daily. 180 tablet 3  . brimonidine (ALPHAGAN) 0.2 % ophthalmic solution Place 1 drop into the left eye 2 (two) times daily.     . Calcium Carbonate-Vit D-Min (CALTRATE 600+D PLUS PO) Take 1 tablet by mouth daily.    . Cholecalciferol (VITAMIN D PO) Take 2,000 Units by mouth daily.     . Coenzyme Q10 (COQ-10) 200 MG CAPS Take 1 capsule by mouth daily.    . Cranberry 500 MG CAPS Take 500 mg by mouth daily.      . digoxin (LANOXIN) 0.125 MG tablet  Take 1 tablet (0.125 mg total) by mouth daily. 30 tablet 6  . diltiazem (CARDIZEM CD) 240 MG 24 hr capsule Take 1 capsule (240 mg total) by mouth daily. 90 capsule 3  . fexofenadine-pseudoephedrine (ALLEGRA-D) 60-120 MG per tablet Take 1 tablet by mouth daily as needed (allergies).     . furosemide (LASIX) 20 MG tablet Take 1 tablet (20 mg total) by mouth daily as needed. Take daily as needed for swelling 90 tablet 3  . LUMIGAN 0.01 % SOLN Place 1 drop into both eyes at bedtime.    . Multiple Vitamin (MULTIVITAMIN) tablet Take 1 tablet by mouth daily. Calcium plus D    . Omega-3 Fatty Acids (FISH OIL) 1200 MG CAPS Take 1 capsule by mouth daily.    . tamoxifen (NOLVADEX) 10 MG tablet Take 10 mg daily. (Patient taking differently: Take 10 mg by mouth daily.) 90 tablet 3  . flecainide (TAMBOCOR) 100 MG tablet Take 1  tablet (100 mg total) by mouth 2 (two) times daily. 60 tablet 6   No current facility-administered medications for this visit.    Allergies  Allergen Reactions  . Reglan [Metoclopramide] Other (See Comments)    Causes severe jitters    History   Social History  . Marital Status: Widowed    Spouse Name: N/A    Number of Children: N/A  . Years of Education: N/A   Occupational History  . Not on file.   Social History Main Topics  . Smoking status: Former Smoker -- 1.00 packs/day for 2 years    Types: Cigarettes    Start date: 09/17/1962    Quit date: 11/16/1966  . Smokeless tobacco: Never Used  . Alcohol Use: No  . Drug Use: No  . Sexual Activity: Not on file   Other Topics Concern  . Not on file   Social History Narrative   Lives alone in [place]   Retired   Worked previously for [employer] and [employer]    Family History  Problem Relation Age of Onset  . Heart disease Mother   . Stroke Father   . Cancer Father   . Early death Brother   . Cancer Sister   . Cancer Sister   . Cancer Sister   . Diabetes Daughter     ROS- All systems are reviewed and negative except as per the HPI above  Physical Exam: Filed Vitals:   09/30/14 1623  BP: 122/70  Pulse: 68  Height: 5\' 3"  (1.6 m)  Weight: 193 lb 6 oz (87.714 kg)    GEN- The patient is well appearing, alert and oriented x 3 today.   Head- normocephalic, atraumatic Eyes-  Sclera clear, conjunctiva pink Ears- hearing intact Oropharynx- clear Neck- supple, no JVP Lymph- no cervical lymphadenopathy Lungs- Clear to ausculation bilaterally, normal work of breathing Heart- Regular rate and rhythm, no murmurs, rubs or gallops, PMI not laterally displaced GI- soft, NT, ND, + BS Extremities- no clubbing, cyanosis, or edema MS- no significant deformity or atrophy Skin- no rash or lesion Psych- euthymic mood, full affect Neuro- strength and sensation are intact  EKG today reveals sinus rhythm 69 bpm, PR  176, QRS 100, otherwise normal ekg Echo is reviewed Epic records reviewed.  Assessment and Plan:  1. 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   Discussed with Dr. [name]. Increase flecainide to 100 mg bid and get peak level  In 72 hours. Continue eliquis and diltiazem Once AF  is improved,  stop digoxin which has been shown to have increased mortality in AF patients. Could also consider decreasing diltiazem at some point due to edema If her Af is improved then she should have plain treadmill test ordered at that time.   2. Snoring She is overweight and thinks that she snores I have advised sleep study which she declines at this time  Follow-up with afib clinic in 2-3 weeks, sooner if needed. Once her AF is better controlled, we will return her care in its entirety to Dr Harl Bowie.  Seen in collaboration with Dr. [name].

## 2014-10-04 ENCOUNTER — Other Ambulatory Visit (INDEPENDENT_AMBULATORY_CARE_PROVIDER_SITE_OTHER): Payer: Medicare Other | Admitting: *Deleted

## 2014-10-04 DIAGNOSIS — I4891 Unspecified atrial fibrillation: Secondary | ICD-10-CM

## 2014-10-08 LAB — FLECAINIDE LEVEL: Flecainide: 0.51 ug/mL (ref 0.20–1.00)

## 2014-10-11 ENCOUNTER — Encounter (HOSPITAL_COMMUNITY): Payer: Medicare Other | Attending: Hematology

## 2014-10-11 DIAGNOSIS — Z853 Personal history of malignant neoplasm of breast: Secondary | ICD-10-CM | POA: Diagnosis present

## 2014-10-11 NOTE — Progress Notes (Signed)
LABS FOR [id number]

## 2014-10-14 ENCOUNTER — Encounter: Payer: Self-pay | Admitting: Nurse Practitioner

## 2014-10-14 ENCOUNTER — Encounter: Payer: Self-pay | Admitting: *Deleted

## 2014-10-14 ENCOUNTER — Ambulatory Visit (INDEPENDENT_AMBULATORY_CARE_PROVIDER_SITE_OTHER): Payer: Medicare Other | Admitting: *Deleted

## 2014-10-14 ENCOUNTER — Other Ambulatory Visit (HOSPITAL_COMMUNITY): Payer: Self-pay | Admitting: Nurse Practitioner

## 2014-10-14 VITALS — BP 120/78 | HR 88 | Ht 63.5 in | Wt 193.1 lb

## 2014-10-14 DIAGNOSIS — I484 Atypical atrial flutter: Secondary | ICD-10-CM

## 2014-10-14 DIAGNOSIS — I480 Paroxysmal atrial fibrillation: Secondary | ICD-10-CM

## 2014-10-14 DIAGNOSIS — Z01812 Encounter for preprocedural laboratory examination: Secondary | ICD-10-CM

## 2014-10-14 NOTE — Progress Notes (Signed)
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  She has a h/o breast CA and underwent XRT in 2010 and again in 2011.  She reports that her symptoms of AF began in 2011 after her XRT.  She has tachypalpitations which are more prominent at night.  Episodes typically last several minutes at a time.  She has episodes most evenings.  She has been evaluated by Dr Harl Bowie and has documented afib with RVR as the cause.  She was   placed on diltiazem.  Dose titration has been limited by edema.  She is also on digoxin.   Her chads2vasc score is at least 2.  She is anticoagulated with eliquis.  She is tolerating this without difficulty.  She is unaware of triggers or precipitants for her afib.    When seen by Dr. [name] 1/4 she was started on Flecainide 50 mg bid.  On last viist, flecainide was increased to 100 mg bid  because she has not seen any improvement in afib symptoms and actually thought episodes may be worse. Peak flecainide level on 100 mg bid was in range at 0.51. Ekg today shows typical aflutter at 88 bpm. Feels somewhat fatigued on flecainide, dig to be stopped today. Has noticed mild tremor of left hand over last few weeks. ? Side effect of flecainide. Will watch.  Today, she denies symptoms of  chest pain,   orthopnea, PND, lower extremity edema, dizziness, presyncope, syncope, or neurologic sequela.The patient is tolerating medications without difficulties and is otherwise without complaint today.   Past Medical History  Diagnosis Date  . Cancer of breast July 2010    s/p XRT 2010 and again in 2011  . Glaucoma   . Cramps, muscle, general     severe breast cramping   . Night sweats   . Pain     breast pain  . Chronic coughing   . Sinus problem   . Wears glasses   . Glaucoma     left eye   . Joint pain     foot and knee pain    . Paroxysmal atrial fibrillation 05/2014   Past Surgical History  Procedure Laterality Date  . Breast surgery  2010    for cancer  . Tubal ligation  1984  . Colonoscopy N/A 02/05/2013    Procedure: COLONOSCOPY;  Surgeon: [name], MD;  [facility]: [facility];  Service: Endoscopy;  Laterality: N/A;  830    Current Outpatient Prescriptions  Medication Sig Dispense Refill  . albuterol (PROVENTIL HFA;VENTOLIN HFA) 108 (90 BASE) MCG/ACT inhaler Inhale into the lungs every 6 (six) hours as needed for wheezing or shortness of breath.    Marland Kitchen apixaban (ELIQUIS) 5 MG TABS tablet Take 1 tablet (5 mg total) by mouth 2 (two) times daily. 180 tablet 3  . brimonidine (ALPHAGAN) 0.2 % ophthalmic solution Place 1 drop into the left eye 2 (two) times daily.     . Calcium Carbonate-Vit D-Min (CALTRATE 600+D PLUS PO) Take 1 tablet by mouth daily.    . Cholecalciferol (VITAMIN D PO) Take 2,000 Units by mouth daily.     . Coenzyme Q10 (COQ-10) 200 MG CAPS Take 1 capsule by mouth daily.    . Cranberry 500 MG CAPS Take 500 mg by mouth daily.      Marland Kitchen  digoxin (LANOXIN) 0.125 MG tablet Take 1 tablet (0.125 mg total) by mouth daily. 30 tablet 6  . diltiazem (CARDIZEM CD) 240 MG 24 hr capsule Take 1 capsule (240 mg total) by mouth daily. 90 capsule 3  . fexofenadine-pseudoephedrine (ALLEGRA-D) 60-120 MG per tablet Take 1 tablet by mouth daily as needed (allergies).     . flecainide (TAMBOCOR) 100 MG tablet Take 1 tablet (100 mg total) by mouth 2 (two) times daily. 60 tablet 6  . furosemide (LASIX) 20 MG tablet Take 1 tablet (20 mg total) by mouth daily as needed. Take daily as needed for swelling 90 tablet 3  . LUMIGAN 0.01 % SOLN Place 1 drop into both eyes at bedtime.    . Multiple Vitamin (MULTIVITAMIN) tablet Take 1 tablet by mouth daily. Calcium plus D    . Omega-3 Fatty Acids (FISH OIL) 1200 MG CAPS Take 1 capsule by mouth daily.    . tamoxifen (NOLVADEX) 10 MG tablet Take 10 mg daily. (Patient taking  differently: Take 10 mg by mouth daily.) 90 tablet 3   No current facility-administered medications for this visit.    Allergies  Allergen Reactions  . Reglan [Metoclopramide] Other (See Comments)    Causes severe jitters    History   Social History  . Marital Status: Widowed    Spouse Name: N/A    Number of Children: N/A  . Years of Education: N/A   Occupational History  . Not on file.   Social History Main Topics  . Smoking status: Former Smoker -- 1.00 packs/day for 2 years    Types: Cigarettes    Start date: 09/17/1962    Quit date: 11/16/1966  . Smokeless tobacco: Never Used  . Alcohol Use: No  . Drug Use: No  . Sexual Activity: Not on file   Other Topics Concern  . Not on file   Social History Narrative   Lives alone in [place]   Retired   Worked previously for [employer] and [employer]    Family History  Problem Relation Age of Onset  . Heart disease Mother   . Stroke Father   . Cancer Father   . Early death Brother   . Cancer Sister   . Cancer Sister   . Cancer Sister   . Diabetes Daughter     ROS- All systems are reviewed and negative except as per the HPI above  Physical Exam: Filed Vitals:   10/14/14 1602  BP: 120/78  Pulse: 88  Height: 5' 3.5" (1.613 m)  Weight: 193 lb 1.9 oz (87.599 kg)    GEN- The patient is well appearing, alert and oriented x 3 today.   Head- normocephalic, atraumatic Eyes-  Sclera clear, conjunctiva pink Ears- hearing intact Oropharynx- clear Neck- supple, no JVP Lymph- no cervical lymphadenopathy Lungs- Clear to ausculation bilaterally, normal work of breathing Heart- Regular rate and rhythm, no murmurs, rubs or gallops, PMI not laterally displaced GI- soft, NT, ND, + BS Extremities- no clubbing, cyanosis, or edema MS- no significant deformity or atrophy Skin- no rash or lesion Psych- euthymic mood, full affect Neuro- strength and sensation are intact  EKG today reveals aflutter, typical at 88 bpm, PR  155ms, QRS 64ms, Qtc 450ms Echo is reviewed Epic records reviewed.  Assessment and Plan:  1. 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   Now in aflutter, discussed with Dr. [name], will schedule for DCCV. Has been on NOAC since September without any  missed doses. TEE not indicated. CV procedure discussed in detail. Stop digoxin.  2. Snoring She is overweight and thinks that she snores I have advised sleep study which she declines at this time  F/u with Dr. [name] after CV.  Seen in collaboration with Dr. [name].

## 2014-10-14 NOTE — Patient Instructions (Signed)
Your physician has recommended you make the following change in your medication:  1) STOP Digoxin  Pre-procedure labs today: BMET, CBCD, INR  Your physician recommends that you schedule a follow-up appointment in: 6 weeks with Dr. [name].  Thank you for choosing [facility]!!      Electrical Cardioversion Electrical cardioversion is the delivery of a jolt of electricity to change the rhythm of the heart. Sticky patches or metal paddles are placed on the chest to deliver the electricity from a device. This is done to restore a normal rhythm. A rhythm that is too fast or not regular keeps the heart from pumping well. Electrical cardioversion is done in an emergency if:   There is low or no blood pressure as a result of the heart rhythm.   Normal rhythm must be restored as fast as possible to protect the brain and heart from further damage.   It may save a life. Cardioversion may be done for heart rhythms that are not immediately life threatening, such as atrial fibrillation or flutter, in which:   The heart is beating too fast or is not regular.   Medicine to change the rhythm has not worked.   It is safe to wait in order to allow time for preparation.  Symptoms of the abnormal rhythm are bothersome.  The risk of stroke and other serious problems can be reduced. LET [facility] CARE PROVIDER KNOW ABOUT:   Any allergies you have.  All medicines you are taking, including vitamins, herbs, eye drops, creams, and over-the-counter medicines.  Previous problems you or members of your family have had with the use of anesthetics.   Any blood disorders you have.   Previous surgeries you have had.   Medical conditions you have. RISKS AND COMPLICATIONS  Generally, this is a safe procedure. However, problems can occur and include:   Breathing problems related to the anesthetic used.  A blood clot that breaks free and travels to other parts of your body. This could  cause a stroke or other problems. The risk of this is lowered by use of blood-thinning medicine (anticoagulant) prior to the procedure.  Cardiac arrest (rare). BEFORE THE PROCEDURE   You may have tests to detect blood clots in your heart and to evaluate heart function.  You may start taking anticoagulants so your blood does not clot as easily.   Medicines may be given to help stabilize your heart rate and rhythm. PROCEDURE  You will be given medicine through an IV tube to reduce discomfort and make you sleepy (sedative).   An electrical shock will be delivered. AFTER THE PROCEDURE Your heart rhythm will be watched to make sure it does not change.  Document Released: 08/24/2002 Document Revised: 01/18/2014 Document Reviewed: 03/18/2013 [facility] Patient Information 2015 [place], [place]. This information is not intended to replace advice given to you by your health care provider. Make sure you discuss any questions you have with your health care provider.

## 2014-10-15 LAB — BASIC METABOLIC PANEL
BUN: 12 mg/dL (ref 6–23)
CO2: 23 meq/L (ref 19–32)
Calcium: 9.4 mg/dL (ref 8.4–10.5)
Chloride: 105 mEq/L (ref 96–112)
Creatinine, Ser: 0.85 mg/dL (ref 0.40–1.20)
GFR: 70.4 mL/min (ref >60.00)
Glucose, Bld: 87 mg/dL (ref 70–99)
POTASSIUM: 4.2 meq/L (ref 3.5–5.1)
SODIUM: 138 meq/L (ref 135–145)

## 2014-10-15 LAB — PROTIME-INR
INR: 1.1 ratio — ABNORMAL HIGH (ref 0.8–1.0)
Prothrombin Time: 12.4 s (ref 9.6–13.1)

## 2014-10-15 LAB — CBC WITH DIFFERENTIAL/PLATELET
BASOS PCT: 0.5 % (ref 0.0–3.0)
Basophils Absolute: 0 10*3/uL (ref 0.0–0.1)
Eosinophils Absolute: 0.2 10*3/uL (ref 0.0–0.7)
Eosinophils Relative: 1.9 % (ref 0.0–5.0)
HCT: 40.8 % (ref 36.0–46.0)
Hemoglobin: 13.6 g/dL (ref 12.0–15.0)
LYMPHS PCT: 33.6 % (ref 12.0–46.0)
Lymphs Abs: 3.3 10*3/uL (ref 0.7–4.0)
MCHC: 33.4 g/dL (ref 30.0–36.0)
MCV: 90.9 fl (ref 78.0–100.0)
Monocytes Absolute: 0.6 10*3/uL (ref 0.1–1.0)
Monocytes Relative: 6.3 % (ref 3.0–12.0)
Neutro Abs: 5.7 10*3/uL (ref 1.4–7.7)
Neutrophils Relative %: 57.7 % (ref 43.0–77.0)
Platelets: 191 10*3/uL (ref 150.0–400.0)
RBC: 4.49 Mil/uL (ref 3.87–5.11)
RDW: 13.1 % (ref 11.5–15.5)
WBC: 9.9 10*3/uL (ref 4.0–10.5)

## 2014-10-21 ENCOUNTER — Ambulatory Visit (HOSPITAL_COMMUNITY): Payer: Medicare Other | Admitting: Anesthesiology

## 2014-10-21 ENCOUNTER — Encounter (HOSPITAL_COMMUNITY): Payer: Self-pay

## 2014-10-21 ENCOUNTER — Encounter (HOSPITAL_COMMUNITY)
Admission: RE | Disposition: A | Discharge status: Home / Self Care | Payer: Self-pay | Source: Ambulatory Visit | Attending: Cardiology

## 2014-10-21 ENCOUNTER — Ambulatory Visit (HOSPITAL_COMMUNITY)
Admission: RE | Admit: 2014-10-21 | Discharge: 2014-10-21 | Disposition: A | Discharge status: Home / Self Care | Payer: Medicare Other | Source: Ambulatory Visit | Attending: Cardiology | Admitting: Cardiology

## 2014-10-21 DIAGNOSIS — K219 Gastro-esophageal reflux disease without esophagitis: Secondary | ICD-10-CM | POA: Insufficient documentation

## 2014-10-21 DIAGNOSIS — I10 Essential (primary) hypertension: Secondary | ICD-10-CM | POA: Diagnosis not present

## 2014-10-21 DIAGNOSIS — I4891 Unspecified atrial fibrillation: Secondary | ICD-10-CM | POA: Diagnosis not present

## 2014-10-21 DIAGNOSIS — Z888 Allergy status to other drugs, medicaments and biological substances status: Secondary | ICD-10-CM | POA: Insufficient documentation

## 2014-10-21 DIAGNOSIS — J449 Chronic obstructive pulmonary disease, unspecified: Secondary | ICD-10-CM | POA: Insufficient documentation

## 2014-10-21 DIAGNOSIS — Z6834 Body mass index (BMI) 34.0-34.9, adult: Secondary | ICD-10-CM | POA: Insufficient documentation

## 2014-10-21 DIAGNOSIS — Z87891 Personal history of nicotine dependence: Secondary | ICD-10-CM | POA: Diagnosis not present

## 2014-10-21 DIAGNOSIS — Z853 Personal history of malignant neoplasm of breast: Secondary | ICD-10-CM | POA: Diagnosis not present

## 2014-10-21 HISTORY — PX: CARDIOVERSION: SHX1299

## 2014-10-21 HISTORY — DX: Cardiac arrhythmia, unspecified: I49.9

## 2014-10-21 SURGERY — CARDIOVERSION
Anesthesia: General

## 2014-10-21 MED ORDER — LIDOCAINE HCL (CARDIAC) 20 MG/ML IV SOLN
INTRAVENOUS | Status: DC | PRN
  Administered 2014-10-21: 20 mg via INTRAVENOUS

## 2014-10-21 MED ORDER — SODIUM CHLORIDE 0.9 % IV SOLN
INTRAVENOUS | Status: DC | PRN
  Administered 2014-10-21: 12:00:00 via INTRAVENOUS

## 2014-10-21 MED ORDER — SODIUM CHLORIDE 0.9 % IV SOLN
INTRAVENOUS | Status: DC
  Administered 2014-10-21: 500 mL via INTRAVENOUS

## 2014-10-21 MED ORDER — PROPOFOL 10 MG/ML IV BOLUS
INTRAVENOUS | Status: DC | PRN
  Administered 2014-10-21: 100 mg via INTRAVENOUS

## 2014-10-21 NOTE — CV Procedure (Signed)
    Cardioversion Note  Heidi Lopez [id number] Jul 30, 1946  Procedure: DC Cardioversion Indications: atrial fibrillation  Procedure Details Consent: Obtained Time Out: Verified patient identification, verified procedure, site/side was marked, verified correct patient position, special equipment/implants available, Radiology Safety Procedures followed,  medications/allergies/relevent history reviewed, required imaging and test results available.  Performed  The patient has been on adequate anticoagulation.  The patient received 60 mg IV Propofol and 20 mg of iv Lidocain administered by anesthesia staff for sedation.  Synchronous cardioversion was performed at 120 joules.  The cardioversion was successful.   Complications: No apparent complications Patient did tolerate procedure well.   Heidi Spark, MD, [facility] 10/21/2014, 11:02 AM

## 2014-10-21 NOTE — Interval H&P Note (Signed)
History and Physical Interval Note:  10/21/2014 11:02 AM  Heidi Lopez  has presented today for surgery, with the diagnosis of A FLUTTER  The various methods of treatment have been discussed with the patient and family. After consideration of risks, benefits and other options for treatment, the patient has consented to  Procedure(s): CARDIOVERSION (N/A) as a surgical intervention .  The patient's history has been reviewed, patient examined, no change in status, stable for surgery.  I have reviewed the patient's chart and labs.  Questions were answered to the patient's satisfaction.     [name]

## 2014-10-21 NOTE — H&P (View-Only) (Signed)
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  She has a h/o breast CA and underwent XRT in 2010 and again in 2011.  She reports that her symptoms of AF began in 2011 after her XRT.  She has tachypalpitations which are more prominent at night.  Episodes typically last several minutes at a time.  She has episodes most evenings.  She has been evaluated by Dr Harl Bowie and has documented afib with RVR as the cause.  She was   placed on diltiazem.  Dose titration has been limited by edema.  She is also on digoxin.   Her chads2vasc score is at least 2.  She is anticoagulated with eliquis.  She is tolerating this without difficulty.  She is unaware of triggers or precipitants for her afib.    When seen by Dr. [name] 1/4 she was started on Flecainide 50 mg bid.  On last viist, flecainide was increased to 100 mg bid  because she has not seen any improvement in afib symptoms and actually thought episodes may be worse. Peak flecainide level on 100 mg bid was in range at 0.51. Ekg today shows typical aflutter at 88 bpm. Feels somewhat fatigued on flecainide, dig to be stopped today. Has noticed mild tremor of left hand over last few weeks. ? Side effect of flecainide. Will watch.  Today, she denies symptoms of  chest pain,   orthopnea, PND, lower extremity edema, dizziness, presyncope, syncope, or neurologic sequela.The patient is tolerating medications without difficulties and is otherwise without complaint today.   Past Medical History  Diagnosis Date  . Cancer of breast July 2010    s/p XRT 2010 and again in 2011  . Glaucoma   . Cramps, muscle, general     severe breast cramping   . Night sweats   . Pain     breast pain  . Chronic coughing   . Sinus problem   . Wears glasses   . Glaucoma     left eye   . Joint pain     foot and knee pain    . Paroxysmal atrial fibrillation 05/2014   Past Surgical History  Procedure Laterality Date  . Breast surgery  2010    for cancer  . Tubal ligation  1984  . Colonoscopy N/A 02/05/2013    Procedure: COLONOSCOPY;  Surgeon: [name], MD;  Location: [facility];  Service: Endoscopy;  Laterality: N/A;  830    Current Outpatient Prescriptions  Medication Sig Dispense Refill  . albuterol (PROVENTIL HFA;VENTOLIN HFA) 108 (90 BASE) MCG/ACT inhaler Inhale into the lungs every 6 (six) hours as needed for wheezing or shortness of breath.    Marland Kitchen apixaban (ELIQUIS) 5 MG TABS tablet Take 1 tablet (5 mg total) by mouth 2 (two) times daily. 180 tablet 3  . brimonidine (ALPHAGAN) 0.2 % ophthalmic solution Place 1 drop into the left eye 2 (two) times daily.     . Calcium Carbonate-Vit D-Min (CALTRATE 600+D PLUS PO) Take 1 tablet by mouth daily.    . Cholecalciferol (VITAMIN D PO) Take 2,000 Units by mouth daily.     . Coenzyme Q10 (COQ-10) 200 MG CAPS Take 1 capsule by mouth daily.    . Cranberry 500 MG CAPS Take 500 mg by mouth daily.      Marland Kitchen  digoxin (LANOXIN) 0.125 MG tablet Take 1 tablet (0.125 mg total) by mouth daily. 30 tablet 6  . diltiazem (CARDIZEM CD) 240 MG 24 hr capsule Take 1 capsule (240 mg total) by mouth daily. 90 capsule 3  . fexofenadine-pseudoephedrine (ALLEGRA-D) 60-120 MG per tablet Take 1 tablet by mouth daily as needed (allergies).     . flecainide (TAMBOCOR) 100 MG tablet Take 1 tablet (100 mg total) by mouth 2 (two) times daily. 60 tablet 6  . furosemide (LASIX) 20 MG tablet Take 1 tablet (20 mg total) by mouth daily as needed. Take daily as needed for swelling 90 tablet 3  . LUMIGAN 0.01 % SOLN Place 1 drop into both eyes at bedtime.    . Multiple Vitamin (MULTIVITAMIN) tablet Take 1 tablet by mouth daily. Calcium plus D    . Omega-3 Fatty Acids (FISH OIL) 1200 MG CAPS Take 1 capsule by mouth daily.    . tamoxifen (NOLVADEX) 10 MG tablet Take 10 mg daily. (Patient taking  differently: Take 10 mg by mouth daily.) 90 tablet 3   No current facility-administered medications for this visit.    Allergies  Allergen Reactions  . Reglan [Metoclopramide] Other (See Comments)    Causes severe jitters    History   Social History  . Marital Status: Widowed    Spouse Name: N/A    Number of Children: N/A  . Years of Education: N/A   Occupational History  . Not on file.   Social History Main Topics  . Smoking status: Former Smoker -- 1.00 packs/day for 2 years    Types: Cigarettes    Start date: 09/17/1962    Quit date: 11/16/1966  . Smokeless tobacco: Never Used  . Alcohol Use: No  . Drug Use: No  . Sexual Activity: Not on file   Other Topics Concern  . Not on file   Social History Narrative   Lives alone in [place]   Retired   Worked previously for [employer] and [employer]    Family History  Problem Relation Age of Onset  . Heart disease Mother   . Stroke Father   . Cancer Father   . Early death Brother   . Cancer Sister   . Cancer Sister   . Cancer Sister   . Diabetes Daughter     ROS- All systems are reviewed and negative except as per the HPI above  Physical Exam: Filed Vitals:   10/14/14 1602  BP: 120/78  Pulse: 88  Height: 5' 3.5" (1.613 m)  Weight: 193 lb 1.9 oz (87.599 kg)    GEN- The patient is well appearing, alert and oriented x 3 today.   Head- normocephalic, atraumatic Eyes-  Sclera clear, conjunctiva pink Ears- hearing intact Oropharynx- clear Neck- supple, no JVP Lymph- no cervical lymphadenopathy Lungs- Clear to ausculation bilaterally, normal work of breathing Heart- Regular rate and rhythm, no murmurs, rubs or gallops, PMI not laterally displaced GI- soft, NT, ND, + BS Extremities- no clubbing, cyanosis, or edema MS- no significant deformity or atrophy Skin- no rash or lesion Psych- euthymic mood, full affect Neuro- strength and sensation are intact  EKG today reveals aflutter, typical at 88 bpm, PR  157ms, QRS 77ms, Qtc 453ms Echo is reviewed Epic records reviewed.  Assessment and Plan:  1. 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   Now in aflutter, discussed with Dr. [name], will schedule for DCCV. Has been on NOAC since September without any  missed doses. TEE not indicated. CV procedure discussed in detail. Stop digoxin.  2. Snoring She is overweight and thinks that she snores I have advised sleep study which she declines at this time  F/u with Dr. [name] after CV.  Seen in collaboration with Dr. [name].

## 2014-10-21 NOTE — Anesthesia Preprocedure Evaluation (Addendum)
Anesthesia Evaluation  Patient identified by MRN, date of birth, ID band Patient awake    Reviewed: Allergy & Precautions, NPO status , Patient's Chart, lab work & pertinent test results, reviewed documented beta blocker date and time   History of Anesthesia Complications Negative for: history of anesthetic complications  Airway Mallampati: II  TM Distance: >3 FB Neck ROM: Full    Dental  (+) Missing, Dental Advisory Given   Pulmonary COPD (not needed in over a month) COPD inhaler, former smoker,  breath sounds clear to auscultation        Cardiovascular hypertension, Pt. on medications - angina+ dysrhythmias Atrial Fibrillation Rhythm:Irregular Rate:Normal  '15 ECHO: EF 55-60%, valves OK   Neuro/Psych negative neurological ROS     GI/Hepatic negative GI ROS, Neg liver ROS, GERD-  Controlled,  Endo/Other  Morbid obesity  Renal/GU negative Renal ROS     Musculoskeletal   Abdominal (+) + obese,   Peds  Hematology  (+) Blood dyscrasia (Eliquis), ,   Anesthesia Other Findings Breast cancer: surgery, XRT  Reproductive/Obstetrics                          Anesthesia Physical Anesthesia Plan  ASA: III  Anesthesia Plan: General   Post-op Pain Management:    Induction: Intravenous  Airway Management Planned: Natural Airway and Mask  Additional Equipment:   Intra-op Plan:   Post-operative Plan:   Informed Consent: I have reviewed the patients History and Physical, chart, labs and discussed the procedure including the risks, benefits and alternatives for the proposed anesthesia with the patient or authorized representative who has indicated his/her understanding and acceptance.   Dental advisory given  Plan Discussed with: CRNA and Surgeon  Anesthesia Plan Comments: (Plan routine monitors, GA for cardioversion  )        Anesthesia Quick Evaluation

## 2014-10-21 NOTE — Anesthesia Postprocedure Evaluation (Signed)
  Anesthesia Post-op Note  Patient: Heidi Lopez  Procedure(s) Performed: Procedure(s): CARDIOVERSION (N/A)  Patient Location: Endoscopy Unit  Anesthesia Type:MAC  Level of Consciousness: awake, alert  and oriented  Airway and Oxygen Therapy: Patient Spontanous Breathing and Patient connected to nasal cannula oxygen  Post-op Pain: none  Post-op Assessment: Post-op Vital signs reviewed, Patient's Cardiovascular Status Stable, Respiratory Function Stable, Patent Airway, No signs of Nausea or vomiting, Adequate PO intake, Pain level controlled and Pain level not controlled  Post-op Vital Signs: Reviewed and stable  Last Vitals:  Filed Vitals:   10/21/14 1150  BP: 121/60  Temp: 36.3 C  Resp: 18    Complications: No apparent anesthesia complications

## 2014-10-21 NOTE — Discharge Instructions (Signed)
Electrical Cardioversion, Care After °Refer to this sheet in the next few weeks. These instructions provide you with information on caring for yourself after your procedure. Your health care provider may also give you more specific instructions. Your treatment has been planned according to current medical practices, but problems sometimes occur. Call your health care provider if you have any problems or questions after your procedure. °WHAT TO EXPECT AFTER THE PROCEDURE °After your procedure, it is typical to have the following sensations: °· Some redness on the skin where the shocks were delivered. If this is tender, a sunburn lotion or hydrocortisone cream may help. °· Possible return of an abnormal heart rhythm within hours or days after the procedure. °HOME CARE INSTRUCTIONS °· Take medicines only as directed by your health care provider. Be sure you understand how and when to take your medicine. °· Learn how to feel your pulse and check it often. °· Limit your activity for 48 hours after the procedure or as directed by your health care provider. °· Avoid or minimize caffeine and other stimulants as directed by your health care provider. °SEEK MEDICAL CARE IF: °· You feel like your heart is beating too fast or your pulse is not regular. °· You have any questions about your medicines. °· You have bleeding that will not stop. °SEEK IMMEDIATE MEDICAL CARE IF: °· You are dizzy or feel faint. °· It is hard to breathe or you feel short of breath. °· There is a change in discomfort in your chest. °· Your speech is slurred or you have trouble moving an arm or leg on one side of your body. °· You get a serious muscle cramp that does not go away. °· Your fingers or toes turn cold or blue. °Document Released: 06/24/2013 Document Revised: 01/18/2014 Document Reviewed: 06/24/2013 °ExitCare® Patient Information ©2015 [employer]. This information is not intended to replace advice given to you by your health care provider.  Make sure you discuss any questions you have with your health care provider. ° ° °Conscious Sedation, Adult, Care After °Refer to this sheet in the next few weeks. These instructions provide you with information on caring for yourself after your procedure. Your health care provider may also give you more specific instructions. Your treatment has been planned according to current medical practices, but problems sometimes occur. Call your health care provider if you have any problems or questions after your procedure. °WHAT TO EXPECT AFTER THE PROCEDURE  °After your procedure: °· You may feel sleepy, clumsy, and have poor balance for several hours. °· Vomiting may occur if you eat too soon after the procedure. °HOME CARE INSTRUCTIONS °· Do not participate in any activities where you could become injured for at least 24 hours. Do not: °¨ Drive. °¨ Swim. °¨ Ride a bicycle. °¨ Operate heavy machinery. °¨ Cook. °¨ Use power tools. °¨ Climb ladders. °¨ Work from a high place. °· Do not make important decisions or sign legal documents until you are improved. °· If you vomit, drink water, juice, or soup when you can drink without vomiting. Make sure you have little or no nausea before eating solid foods. °· Only take over-the-counter or prescription medicines for pain, discomfort, or fever as directed by your health care provider. °· Make sure you and your family fully understand everything about the medicines given to you, including what side effects may occur. °· You should not drink alcohol, take sleeping pills, or take medicines that cause drowsiness for at least 24   hours. °· If you smoke, do not smoke without supervision. °· If you are feeling better, you may resume normal activities 24 hours after you were sedated. °· Keep all appointments with your health care provider. °SEEK MEDICAL CARE IF: °· Your skin is pale or bluish in color. °· You continue to feel nauseous or vomit. °· Your pain is getting worse and is not  helped by medicine. °· You have bleeding or swelling. °· You are still sleepy or feeling clumsy after 24 hours. °SEEK IMMEDIATE MEDICAL CARE IF: °· You develop a rash. °· You have difficulty breathing. °· You develop any type of allergic problem. °· You have a fever. °MAKE SURE YOU: °· Understand these instructions. °· Will watch your condition. °· Will get help right away if you are not doing well or get worse. °Document Released: 06/24/2013 Document Reviewed: 06/24/2013 °ExitCare® Patient Information ©2015 [employer]. This information is not intended to replace advice given to you by your health care provider. Make sure you discuss any questions you have with your health care provider. ° °

## 2014-10-21 NOTE — Transfer of Care (Signed)
Immediate Anesthesia Transfer of Care Note  Patient: Heidi Lopez  Procedure(s) Performed: Procedure(s): CARDIOVERSION (N/A)  Patient Location: Endoscopy Unit  Anesthesia Type:MAC  Level of Consciousness: awake, alert  and oriented  Airway & Oxygen Therapy: Patient Spontanous Breathing and Patient connected to nasal cannula oxygen  Post-op Assessment: Report given to RN, Post -op Vital signs reviewed and stable, Patient moving all extremities X 4 and Patient able to stick tongue midline  Post vital signs: Reviewed and stable  Last Vitals:  Filed Vitals:   10/21/14 1150  BP: 121/60  Temp: 36.3 C  Resp: 18    Complications: No apparent anesthesia complications

## 2014-10-22 ENCOUNTER — Encounter (HOSPITAL_COMMUNITY): Payer: Self-pay | Admitting: Cardiology

## 2014-11-03 LAB — CANCER ANTIGEN 27.29: CA 27.29: 26.7 U/mL (ref 0.0–38.6)

## 2014-11-03 LAB — CEA: CEA: 2.3 ng/mL (ref 0.0–4.7)

## 2014-11-08 ENCOUNTER — Encounter (HOSPITAL_COMMUNITY): Payer: Medicare Other | Attending: Hematology

## 2014-11-08 DIAGNOSIS — Z853 Personal history of malignant neoplasm of breast: Secondary | ICD-10-CM

## 2014-11-08 NOTE — Progress Notes (Signed)
LABS FOR CA2729,CEA

## 2014-11-09 LAB — CANCER ANTIGEN 27.29: CA 27.29: 25.6 U/mL (ref 0.0–38.6)

## 2014-11-09 LAB — CEA: CEA: 2.6 ng/mL (ref 0.0–4.7)

## 2014-11-15 ENCOUNTER — Other Ambulatory Visit: Payer: Medicare Other | Admitting: Obstetrics & Gynecology

## 2014-11-16 ENCOUNTER — Other Ambulatory Visit (HOSPITAL_COMMUNITY)
Admission: RE | Admit: 2014-11-16 | Discharge: 2014-11-16 | Disposition: A | Discharge status: Home / Self Care | Payer: Medicare Other | Source: Ambulatory Visit | Attending: Obstetrics & Gynecology | Admitting: Obstetrics & Gynecology

## 2014-11-16 ENCOUNTER — Encounter: Payer: Self-pay | Admitting: Obstetrics & Gynecology

## 2014-11-16 ENCOUNTER — Other Ambulatory Visit (HOSPITAL_COMMUNITY)
Admission: RE | Admit: 2014-11-16 | Payer: Medicare Other | Source: Ambulatory Visit | Admitting: Obstetrics & Gynecology

## 2014-11-16 ENCOUNTER — Ambulatory Visit (INDEPENDENT_AMBULATORY_CARE_PROVIDER_SITE_OTHER): Payer: Medicare Other | Admitting: Obstetrics & Gynecology

## 2014-11-16 VITALS — BP 120/80 | HR 74 | Ht 63.2 in | Wt 197.0 lb

## 2014-11-16 DIAGNOSIS — Z01419 Encounter for gynecological examination (general) (routine) without abnormal findings: Secondary | ICD-10-CM | POA: Diagnosis present

## 2014-11-16 DIAGNOSIS — Z1212 Encounter for screening for malignant neoplasm of rectum: Secondary | ICD-10-CM | POA: Diagnosis not present

## 2014-11-16 DIAGNOSIS — Z853 Personal history of malignant neoplasm of breast: Secondary | ICD-10-CM

## 2014-11-16 DIAGNOSIS — Z1211 Encounter for screening for malignant neoplasm of colon: Secondary | ICD-10-CM

## 2014-11-16 NOTE — Progress Notes (Signed)
Patient ID: Heidi Lopez, female   DOB: Apr 28, 1946, 69 y.o.   MRN: [id number] Subjective:     Heidi Lopez is a 69 y.o. female here for a routine exam.  No LMP recorded. Patient is postmenopausal. No obstetric history on file. Birth Control Method:  None post menopausal Menstrual Calendar(currently): amenorrheic  Current complaints: none.   Current acute medical issues:  none   Recent Gynecologic History No LMP recorded. Patient is postmenopausal. Last Pap: 2015,  normal Last mammogram: 2015,  normal  Past Medical History  Diagnosis Date  . Cancer of breast July 2010    s/p XRT 2010 and again in 2011  . Glaucoma   . Cramps, muscle, general     severe breast cramping   . Night sweats   . Pain     breast pain  . Chronic coughing   . Sinus problem   . Wears glasses   . Glaucoma     left eye   . Joint pain     foot and knee pain  . Paroxysmal atrial fibrillation 05/2014  . Dysrhythmia     atrial flutter    Past Surgical History  Procedure Laterality Date  . Breast surgery  2010    for cancer  . Tubal ligation  1984  . Colonoscopy N/A 02/05/2013    Procedure: COLONOSCOPY;  Surgeon: [name], MD;  Location: [facility];  Service: Endoscopy;  Laterality: N/A;  830  . Cardioversion N/A 10/21/2014    Procedure: CARDIOVERSION;  Surgeon: [name], MD;  Location: [facility] ENDOSCOPY;  Service: Cardiovascular;  Laterality: N/A;    OB History    No data available      History   Social History  . Marital Status: Widowed    Spouse Name: N/A  . Number of Children: N/A  . Years of Education: N/A   Social History Main Topics  . Smoking status: Former Smoker -- 1.00 packs/day for 2 years    Types: Cigarettes    Start date: 09/17/1962    Quit date: 11/16/1966  . Smokeless tobacco: Never Used  . Alcohol Use: No  . Drug Use: No  . Sexual Activity: Not on file   Other Topics Concern  . None   Social History Narrative   Lives alone in [place]   Retired   Worked previously for [employer] and [employer]    Family History  Problem Relation Age of Onset  . Heart disease Mother   . Stroke Father   . Cancer Father   . Early death Brother   . Cancer Sister   . Cancer Sister   . Cancer Sister   . Diabetes Daughter      Current outpatient prescriptions:  .  apixaban (ELIQUIS) 5 MG TABS tablet, Take 1 tablet (5 mg total) by mouth 2 (two) times daily., Disp: 180 tablet, Rfl: 3 .  brimonidine (ALPHAGAN) 0.2 % ophthalmic solution, Place 1 drop into the left eye 2 (two) times daily. , Disp: , Rfl:  .  Calcium Carbonate-Vit D-Min (CALTRATE 600+D PLUS PO), Take 1 tablet by mouth daily., Disp: , Rfl:  .  Cholecalciferol (VITAMIN D PO), Take 2,000 Units by mouth daily. , Disp: , Rfl:  .  Coenzyme Q10 (COQ-10) 200 MG CAPS, Take 1 capsule by mouth daily., Disp: , Rfl:  .  Cranberry 500 MG CAPS, Take 500 mg by mouth daily.  , Disp: , Rfl:  .  diltiazem (CARDIZEM CD) 240 MG  24 hr capsule, Take 1 capsule (240 mg total) by mouth daily., Disp: 90 capsule, Rfl: 3 .  fexofenadine-pseudoephedrine (ALLEGRA-D) 60-120 MG per tablet, Take 1 tablet by mouth daily as needed (allergies). , Disp: , Rfl:  .  LUMIGAN 0.01 % SOLN, Place 1 drop into both eyes at bedtime., Disp: , Rfl:  .  Multiple Vitamin (MULTIVITAMIN) tablet, Take 1 tablet by mouth daily. , Disp: , Rfl:  .  Omega-3 Fatty Acids (FISH OIL) 1200 MG CAPS, Take 1 capsule by mouth daily., Disp: , Rfl:  .  tamoxifen (NOLVADEX) 10 MG tablet, Take 10 mg daily. (Patient taking differently: Take 10 mg by mouth daily.), Disp: 90 tablet, Rfl: 3 .  albuterol (PROVENTIL HFA;VENTOLIN HFA) 108 (90 BASE) MCG/ACT inhaler, Inhale into the lungs every 6 (six) hours as needed for wheezing or shortness of breath., Disp: , Rfl:  .  flecainide (TAMBOCOR) 100 MG tablet, Take 1 tablet (100 mg total) by mouth 2 (two) times daily. (Patient not taking: Reported on 11/16/2014), Disp: 60 tablet, Rfl: 6 .  furosemide (LASIX) 20 MG tablet,  Take 1 tablet (20 mg total) by mouth daily as needed. Take daily as needed for swelling (Patient not taking: Reported on 11/16/2014), Disp: 90 tablet, Rfl: 3  Review of Systems  Review of Systems  Constitutional: Negative for fever, chills, weight loss, malaise/fatigue and diaphoresis.  HENT: Negative for hearing loss, ear pain, nosebleeds, congestion, sore throat, neck pain, tinnitus and ear discharge.   Eyes: Negative for blurred vision, double vision, photophobia, pain, discharge and redness.  Respiratory: Negative for cough, hemoptysis, sputum production, shortness of breath, wheezing and stridor.   Cardiovascular: Negative for chest pain, palpitations, orthopnea, claudication, leg swelling and PND.  Gastrointestinal: negative for abdominal pain. Negative for heartburn, nausea, vomiting, diarrhea, constipation, blood in stool and melena.  Genitourinary: Negative for dysuria, urgency, frequency, hematuria and flank pain.  Musculoskeletal: Negative for myalgias, back pain, joint pain and falls.  Skin: Negative for itching and rash.  Neurological: Negative for dizziness, tingling, tremors, sensory change, speech change, focal weakness, seizures, loss of consciousness, weakness and headaches.  Endo/Heme/Allergies: Negative for environmental allergies and polydipsia. Does not bruise/bleed easily.  Psychiatric/Behavioral: Negative for depression, suicidal ideas, hallucinations, memory loss and substance abuse. The patient is not nervous/anxious and does not have insomnia.        Objective:  Blood pressure 120/80, pulse 74, height 5' 3.2" (1.605 m), weight 197 lb (89.359 kg).   Physical Exam  Vitals reviewed. Constitutional: She is oriented to person, place, and time. She appears well-developed and well-nourished.  HENT:  Head: Normocephalic and atraumatic.        Right Ear: External ear normal.  Left Ear: External ear normal.  Nose: Nose normal.  Mouth/Throat: Oropharynx is clear and moist.   Eyes: Conjunctivae and EOM are normal. Pupils are equal, round, and reactive to light. Right eye exhibits no discharge. Left eye exhibits no discharge. No scleral icterus.  Neck: Normal range of motion. Neck supple. No tracheal deviation present. No thyromegaly present.  Cardiovascular: Normal rate, regular rhythm, normal heart sounds and intact distal pulses.  Exam reveals no gallop and no friction rub.   No murmur heard. Respiratory: Effort normal and breath sounds normal. No respiratory distress. She has no wheezes. She has no rales. She exhibits no tenderness.  GI: Soft. Bowel sounds are normal. She exhibits no distension and no mass. There is no tenderness. There is no rebound and no guarding.  Genitourinary:  Breasts  no masses skin changes or nipple changes bilaterally      Vulva is normal without lesions Vagina is pink moist without discharge Cervix normal in appearance and pap is done Uterus is normal size shape and contour Adnexa is negative  Rectal    hemoccult negative, normal tone, no masses  Musculoskeletal: Normal range of motion. She exhibits no edema and no tenderness.  Neurological: She is alert and oriented to person, place, and time. She has normal reflexes. She displays normal reflexes. No cranial nerve deficit. She exhibits normal muscle tone. Coordination normal.  Skin: Skin is warm and dry. No rash noted. No erythema. No pallor.  Psychiatric: She has a normal mood and affect. Her behavior is normal. Judgment and thought content normal.       Assessment:     Normal gyn exam  History of breast cancer .    Plan:    Follow up in: 1 year.

## 2014-11-18 LAB — CYTOLOGY - PAP

## 2014-11-22 ENCOUNTER — Other Ambulatory Visit: Payer: Self-pay | Admitting: Obstetrics & Gynecology

## 2014-11-22 DIAGNOSIS — R87619 Unspecified abnormal cytological findings in specimens from cervix uteri: Secondary | ICD-10-CM

## 2014-11-23 ENCOUNTER — Ambulatory Visit (INDEPENDENT_AMBULATORY_CARE_PROVIDER_SITE_OTHER): Payer: Medicare Other

## 2014-11-23 ENCOUNTER — Encounter: Payer: Self-pay | Admitting: Obstetrics & Gynecology

## 2014-11-23 ENCOUNTER — Ambulatory Visit (INDEPENDENT_AMBULATORY_CARE_PROVIDER_SITE_OTHER): Payer: Medicare Other | Admitting: Obstetrics & Gynecology

## 2014-11-23 ENCOUNTER — Other Ambulatory Visit: Payer: Self-pay | Admitting: Obstetrics & Gynecology

## 2014-11-23 VITALS — BP 130/90 | HR 76 | Wt 196.0 lb

## 2014-11-23 DIAGNOSIS — R9389 Abnormal findings on diagnostic imaging of other specified body structures: Secondary | ICD-10-CM

## 2014-11-23 DIAGNOSIS — Z79810 Long term (current) use of selective estrogen receptor modulators (SERMs): Secondary | ICD-10-CM

## 2014-11-23 DIAGNOSIS — R938 Abnormal findings on diagnostic imaging of other specified body structures: Secondary | ICD-10-CM | POA: Diagnosis not present

## 2014-11-23 DIAGNOSIS — R87619 Unspecified abnormal cytological findings in specimens from cervix uteri: Secondary | ICD-10-CM

## 2014-11-23 DIAGNOSIS — Z853 Personal history of malignant neoplasm of breast: Secondary | ICD-10-CM

## 2014-11-23 MED ORDER — ENOXAPARIN SODIUM 80 MG/0.8ML ~~LOC~~ SOLN: 80.0000 mg | Freq: Once | SUBCUTANEOUS | Status: DC

## 2014-11-23 NOTE — Progress Notes (Signed)
Patient ID: Heidi Lopez, female   DOB: [birth year], 69 y.o.   MRN: [id number] Decision for surgery: thickened endometrium suspicious for endometrial cancer  Preoperative History and Physical  CHINELO BENN is a 69 y.o. No obstetric history on file. with No LMP recorded. Patient is postmenopausal. admitted for a hysteroscopy uterine curettage.  She came in for routine yearly exam, no gyn problems, no bleeding.  Pap smear revealed atypical glandular cells concerning for endometrial origin. Brought in for a pelvic sonogram and revealed a complex heterogenous cystic endometrium 27 mm thick concerning for endometrial cancer. Will proceed with thorough endometrial assessment with hysteroscopy uterine curettage.  Pt has been taking tamoxifen since 2010 and since she has had breast cancer twice recommended to take the rest of her life.   PMH:    Past Medical History  Diagnosis Date  . Cancer of breast July 2010    s/p XRT 2010 and again in 2011  . Glaucoma   . Cramps, muscle, general     severe breast cramping   . Night sweats   . Pain     breast pain  . Chronic coughing   . Sinus problem   . Wears glasses   . Glaucoma     left eye   . Joint pain     foot and knee pain  . Paroxysmal atrial fibrillation 05/2014  . Dysrhythmia     atrial flutter    PSH:     Past Surgical History  Procedure Laterality Date  . Breast surgery  2010    for cancer  . Tubal ligation  1984  . Colonoscopy N/A 02/05/2013    Procedure: COLONOSCOPY;  Surgeon: [name], MD;  Location: [facility];  Service: Endoscopy;  Laterality: N/A;  830  . Cardioversion N/A 10/21/2014    Procedure: CARDIOVERSION;  Surgeon: [name], MD;  Location: [facility] ENDOSCOPY;  Service: Cardiovascular;  Laterality: N/A;    POb/GynH:      OB History    No data available      SH:   History  Substance Use Topics  . Smoking status: Former Smoker -- 1.00 packs/day for 2 years    Types: Cigarettes    Start date:  09/17/1962    Quit date: 11/16/1966  . Smokeless tobacco: Never Used  . Alcohol Use: No    FH:    Family History  Problem Relation Age of Onset  . Heart disease Mother   . Stroke Father   . Cancer Father   . Early death Brother   . Cancer Sister   . Cancer Sister   . Cancer Sister   . Diabetes Daughter      Allergies:  Allergies  Allergen Reactions  . Metoprolol     Makes her feel bad  . Reglan [Metoclopramide] Other (See Comments)    Causes severe jitters    Medications:       Current outpatient prescriptions:  .  apixaban (ELIQUIS) 5 MG TABS tablet, Take 1 tablet (5 mg total) by mouth 2 (two) times daily., Disp: 180 tablet, Rfl: 3 .  brimonidine (ALPHAGAN) 0.2 % ophthalmic solution, Place 1 drop into the left eye 2 (two) times daily. , Disp: , Rfl:  .  Calcium Carbonate-Vit D-Min (CALTRATE 600+D PLUS PO), Take 1 tablet by mouth daily., Disp: , Rfl:  .  Cholecalciferol (VITAMIN D PO), Take 2,000 Units by mouth daily. , Disp: , Rfl:  .  Coenzyme Q10 (COQ-10) 200  MG CAPS, Take 1 capsule by mouth daily., Disp: , Rfl:  .  Cranberry 500 MG CAPS, Take 500 mg by mouth daily.  , Disp: , Rfl:  .  diltiazem (CARDIZEM CD) 240 MG 24 hr capsule, Take 1 capsule (240 mg total) by mouth daily., Disp: 90 capsule, Rfl: 3 .  flecainide (TAMBOCOR) 100 MG tablet, Take 1 tablet (100 mg total) by mouth 2 (two) times daily., Disp: 60 tablet, Rfl: 6 .  LUMIGAN 0.01 % SOLN, Place 1 drop into both eyes at bedtime., Disp: , Rfl:  .  Multiple Vitamin (MULTIVITAMIN) tablet, Take 1 tablet by mouth daily. , Disp: , Rfl:  .  Omega-3 Fatty Acids (FISH OIL) 1200 MG CAPS, Take 1 capsule by mouth daily., Disp: , Rfl:  .  tamoxifen (NOLVADEX) 10 MG tablet, Take 10 mg daily. (Patient taking differently: Take 10 mg by mouth daily.), Disp: 90 tablet, Rfl: 3 .  albuterol (PROVENTIL HFA;VENTOLIN HFA) 108 (90 BASE) MCG/ACT inhaler, Inhale into the lungs every 6 (six) hours as needed for wheezing or shortness of  breath., Disp: , Rfl:  .  fexofenadine-pseudoephedrine (ALLEGRA-D) 60-120 MG per tablet, Take 1 tablet by mouth daily as needed (allergies). , Disp: , Rfl:  .  furosemide (LASIX) 20 MG tablet, Take 1 tablet (20 mg total) by mouth daily as needed. Take daily as needed for swelling (Patient not taking: Reported on 11/23/2014), Disp: 90 tablet, Rfl: 3  Review of Systems:   Review of Systems  Constitutional: Negative for fever, chills, weight loss, malaise/fatigue and diaphoresis.  HENT: Negative for hearing loss, ear pain, nosebleeds, congestion, sore throat, neck pain, tinnitus and ear discharge.   Eyes: Negative for blurred vision, double vision, photophobia, pain, discharge and redness.  Respiratory: Negative for cough, hemoptysis, sputum production, shortness of breath, wheezing and stridor.   Cardiovascular: Negative for chest pain, + for palpitations: know in out atrial fibrillation, No orthopnea, claudication, leg swelling and PND.  Gastrointestinal: Negative for abdominal pain. Negative for heartburn, nausea, vomiting, diarrhea, constipation, blood in stool and melena.  Genitourinary: Negative for dysuria, urgency, frequency, hematuria and flank pain.  Musculoskeletal: Negative for myalgias, back pain, joint pain and falls.  Skin: Negative for itching and rash.  Neurological: Negative for dizziness, tingling, tremors, sensory change, speech change, focal weakness, seizures, loss of consciousness, weakness and headaches.  Endo/Heme/Allergies: Negative for environmental allergies and polydipsia. Does not bruise/bleed easily.  Psychiatric/Behavioral: Negative for depression, suicidal ideas, hallucinations, memory loss and substance abuse. The patient is not nervous/anxious and does not have insomnia.      PHYSICAL EXAM:  Blood pressure 130/90, pulse 76, weight 196 lb (88.905 kg).    Vitals reviewed. Constitutional: She is oriented to person, place, and time. She appears well-developed and  well-nourished.  HENT:  Head: Normocephalic and atraumatic.  Right Ear: External ear normal.  Left Ear: External ear normal.  Nose: Nose normal.  Mouth/Throat: Oropharynx is clear and moist.  Eyes: Conjunctivae and EOM are normal. Pupils are equal, round, and reactive to light. Right eye exhibits no discharge. Left eye exhibits no discharge. No scleral icterus.  Neck: Normal range of motion. Neck supple. No tracheal deviation present. No thyromegaly present.  Cardiovascular: tachycardia currently in A fib.  Exam reveals no gallop and no friction rub.   No murmur heard. Respiratory: Effort normal and breath sounds normal. No respiratory distress. She has no wheezes. She has no rales. She exhibits no tenderness.  GI: Soft. Bowel sounds are normal. She exhibits  no distension and no mass. There is tenderness. There is no rebound and no guarding.  Genitourinary:       Vulva is normal without lesions Vagina is pink moist without discharge Cervix normal in appearance  Uterus is normal, see sonogram report Adnexa is negative with normal sized ovaries by sonogram  Musculoskeletal: Normal range of motion. She exhibits no edema and no tenderness.  Neurological: She is alert and oriented to person, place, and time. She has normal reflexes. She displays normal reflexes. No cranial nerve deficit. She exhibits normal muscle tone. Coordination normal.  Skin: Skin is warm and dry. No rash noted. No erythema. No pallor.  Psychiatric: She has a normal mood and affect. Her behavior is normal. Judgment and thought content normal.    Labs: Results for orders placed or performed in visit on 11/16/14 (from the past 336 hour(s))  Cytology - PAP   Collection Time: 11/16/14 12:00 AM  Result Value Ref Range   CYTOLOGY - PAP PAP RESULT     EKG: Orders placed or performed during the hospital encounter of 10/21/14  . EKG 12-Lead  . EKG 12-Lead    Imaging Studies: US Transvaginal Non-ob  2014/12/22    GYNECOLOGIC SONOGRAM   TATIYANNA LASHLEY is a 69 y.o. for a pelvic sonogram for abnl pap smear  results with endometrial cells present. Pt H/O Breast Ca on Tamoxifen. Pt  notes no vaginal bleeding although pelvic cramping present.  Uterus                      9.1 x 6.6 x 5.6 cm, anteverted  Endometrium          28.7 mm, symmetrical, cystic thickened with +Doppler  flow noted within  Right ovary             ?? Unable to positively identify ovarian tissue  although no adnexal masses noted  Left ovary                2.2 x 1.1 x 0.9 cm,   No free fluid or adnexal masses noted within the pelvis  Technician Comments:  Anteverted uterus, ENDOMETRIUM-28.81mm thickened cystic with +Doppler flow  noted within, bilateral adnexa appears WNL, no free fluid or adnexal  masses noted within the pelvis   Lazarus Gowda December 22, 2014 3:09 PM  Clinical Impression and recommendations:  I have reviewed the sonogram results above, combined with the patient's  current clinical course, below are my impressions and any appropriate  recommendations for management based on the sonographic findings.  Complex heterogenous cystic endometrium Normal uterus otherwise Normal ovaries   Rosalynn Sergent H 2014/12/22 3:34 PM       Assessment: Complex thickened endometrium Patient Active Problem List   Diagnosis Date Noted  . Snoring 09/20/2014  . A-fib 06/18/2014  . Pain in joint, ankle and foot 10/16/2011  . History of breast cancer, bilateral: Right T1N0 Lumpectomy July 2010, Left T1c, N0 Lumpectomy 04/26/2010. 05/04/2011    Plan: Hysteroscopy uterine curettage   Aneesa Romey H Dec 22, 2014 3:22 PM

## 2014-11-24 ENCOUNTER — Telehealth: Payer: Self-pay | Admitting: Obstetrics & Gynecology

## 2014-11-26 ENCOUNTER — Telehealth: Payer: Self-pay | Admitting: Internal Medicine

## 2014-11-26 NOTE — Patient Instructions (Signed)
Heidi Lopez  11/26/2014   Your procedure is scheduled on:   12/01/2014  Report to [facility] at  720  AM.  Call this number if you have problems the morning of surgery: [phone]   Remember:   Do not eat food or drink liquids after midnight.   Take these medicines the morning of surgery with A SIP OF WATER:  Diltiazem, allegra, tambocor, tamoxifen   Do not wear jewelry, make-up or nail polish.  Do not wear lotions, powders, or perfumes.   Do not shave 48 hours prior to surgery. Men may shave face and neck.  Do not bring valuables to the hospital.  [facility] is not responsible for any belongings or valuables.               Contacts, dentures or bridgework may not be worn into surgery.  Leave suitcase in the car. After surgery it may be brought to your room.  For patients admitted to the hospital, discharge time is determined by your treatment team.               Patients discharged the day of surgery will not be allowed to drive home.  Name and phone number of your driver: family  Special Instructions: Shower using CHG 2 nights before surgery and the night before surgery.  If you shower the day of surgery use CHG.  Use special wash - you have one bottle of CHG for all showers.  You should use approximately 1/3 of the bottle for each shower.   Please read over the following fact sheets that you were given: Pain Booklet, Coughing and Deep Breathing, Surgical Site Infection Prevention, Anesthesia Post-op Instructions and Care and Recovery After Surgery Endocervical Curettage Endocervical curettage is a procedure to obtain a tissue sample from your endocervical canal to look for abnormal cells. This procedure is sometimes done as part of an exam called a colposcopy, in which your health care provider takes a close look at the surface of your cervix because of an abnormal Pap test result or the presence of genital warts, bleeding, or pain. LET [facility] CARE PROVIDER KNOW  ABOUT:  Recent vaginal infections you have had.  Recent menstrual periods or bleeding you have had.  Any allergies you have.  All medicines you are taking, including vitamins, herbs, eye drops, creams, and over-the-counter medicines.  Previous problems you or members of your family have had with the use of anesthetics.  Any blood disorders you have.  Previous surgeries you have had.  Medical conditions you have. RISKS AND COMPLICATIONS  Generally, endocervical curettage is a safe procedure. However, as with any procedure, complications can occur. Possible complications include:  Excessive bleeding.  Infection.  Injury to surrounding organs.  Allergic reaction to anesthetics or medicines. BEFORE THE PROCEDURE  Do not take aspirin or blood thinners for a week before the procedure. These can cause bleeding.  Do not douche or use tampons for at least 3 days before the procedure.  Do not have sexual intercourse for at least 3 days before the procedure.  Arrange for someone to take you home after the procedure. PROCEDURE   For this procedure, you will be asked to undress from the waist down. You will need to lie on an exam table with your feet in stirrups. Your legs and belly will be covered with a sheet.  A warm metal or plastic instrument (speculum) will be placed in your vagina  to keep it open and to allow your health care provider to see inside your vagina.  A medicine that numbs the area (local anesthetic) may be used.  A sharp curved instrument will be used to scrape cells from your cervix or endocervix.  Medicines may be put on the surface of the tissue to stop any bleeding.  The scraped tissue sample is sent to the lab to see if there are any abnormalities. AFTER THE PROCEDURE   You may have some mild cramping pain.  You will rest in the office or clinic until you are stable and feel ready to go home. Document Released: 06/12/2008 Document Revised: 05/06/2013  Document Reviewed: 04/02/2013 [facility] Patient Information 2015 [place], [place]. This information is not intended to replace advice given to you by your health care provider. Make sure you discuss any questions you have with your health care provider. Hysteroscopy Hysteroscopy is a procedure used for looking inside the womb (uterus). It may be done for various reasons, including:  To evaluate abnormal bleeding, fibroid (benign, noncancerous) tumors, polyps, scar tissue (adhesions), and possibly cancer of the uterus.  To look for lumps (tumors) and other uterine growths.  To look for causes of why a woman cannot get pregnant (infertility), causes of recurrent loss of pregnancy (miscarriages), or a lost intrauterine device (IUD).  To perform a sterilization by blocking the fallopian tubes from inside the uterus. In this procedure, a thin, flexible tube with a tiny light and camera on the end of it (hysteroscope) is used to look inside the uterus. A hysteroscopy should be done right after a menstrual period to be sure you are not pregnant. LET [facility] CARE PROVIDER KNOW ABOUT:   Any allergies you have.  All medicines you are taking, including vitamins, herbs, eye drops, creams, and over-the-counter medicines.  Previous problems you or members of your family have had with the use of anesthetics.  Any blood disorders you have.  Previous surgeries you have had.  Medical conditions you have. RISKS AND COMPLICATIONS  Generally, this is a safe procedure. However, as with any procedure, complications can occur. Possible complications include:  Putting a hole in the uterus.  Excessive bleeding.  Infection.  Damage to the cervix.  Injury to other organs.  Allergic reaction to medicines.  Too much fluid used in the uterus for the procedure. BEFORE THE PROCEDURE   Ask your health care provider about changing or stopping any regular medicines.  Do not take aspirin or blood thinners  for 1 week before the procedure, or as directed by your health care provider. These can cause bleeding.  If you smoke, do not smoke for 2 weeks before the procedure.  In some cases, a medicine is placed in the cervix the day before the procedure. This medicine makes the cervix have a larger opening (dilate). This makes it easier for the instrument to be inserted into the uterus during the procedure.  Do not eat or drink anything for at least 8 hours before the surgery.  Arrange for someone to take you home after the procedure. PROCEDURE   You may be given a medicine to relax you (sedative). You may also be given one of the following:  A medicine that numbs the area around the cervix (local anesthetic).  A medicine that makes you sleep through the procedure (general anesthetic).  The hysteroscope is inserted through the vagina into the uterus. The camera on the hysteroscope sends a picture to a TV screen. This gives  the surgeon a good view inside the uterus.  During the procedure, air or a liquid is put into the uterus, which allows the surgeon to see better.  Sometimes, tissue is gently scraped from inside the uterus. These tissue samples are sent to a lab for testing. AFTER THE PROCEDURE   If you had a general anesthetic, you may be groggy for a couple hours after the procedure.  If you had a local anesthetic, you will be able to go home as soon as you are stable and feel ready.  You may have some cramping. This normally lasts for a couple days.  You may have bleeding, which varies from light spotting for a few days to menstrual-like bleeding for 3-7 days. This is normal.  If your test results are not back during the visit, make an appointment with your health care provider to find out the results. Document Released: 12/10/2000 Document Revised: 06/24/2013 Document Reviewed: 04/02/2013 [facility] Patient Information 2015 [place], [place]. This information is not intended to replace  advice given to you by your health care provider. Make sure you discuss any questions you have with your health care provider. PATIENT INSTRUCTIONS POST-ANESTHESIA  IMMEDIATELY FOLLOWING SURGERY:  Do not drive or operate machinery for the first twenty four hours after surgery.  Do not make any important decisions for twenty four hours after surgery or while taking narcotic pain medications or sedatives.  If you develop intractable nausea and vomiting or a severe headache please notify your doctor immediately.  FOLLOW-UP:  Please make an appointment with your surgeon as instructed. You do not need to follow up with anesthesia unless specifically instructed to do so.  WOUND CARE INSTRUCTIONS (if applicable):  Keep a dry clean dressing on the anesthesia/puncture wound site if there is drainage.  Once the wound has quit draining you may leave it open to air.  Generally you should leave the bandage intact for twenty four hours unless there is drainage.  If the epidural site drains for more than 36-48 hours please call the anesthesia department.  QUESTIONS?:  Please feel free to call your physician or the hospital operator if you have any questions, and they will be happy to assist you.

## 2014-11-26 NOTE — Telephone Encounter (Signed)
**Note De-Identified Heidi Lopez Obfuscation** The pt is scheduled to have [facility] on Wednesday (3/16). She saw her PCP for a sinus infection today and was told that her heart is out of rhythm and that she should contact her cardiologist to see if it is okay for her to have surgery. Will discuss with Dr Radford Pax, DOD.

## 2014-11-26 NOTE — Telephone Encounter (Signed)
New message      Pt is scheduled for a D&C next week.  She now has a sinus inf.  She saw her PCP today.  He said her heart was out of rhythum.  Should she see someone prior to her procedure?

## 2014-11-26 NOTE — Telephone Encounter (Signed)
**Note De-Identified Brande Uncapher Obfuscation** The pt is advised, per Dr Radford Pax (DOD), to call her surgeon early Monday morning to let him know that her pcp told her today that her heart is out of rhythm and that she has a sinus infection as he will need to know about both of these developments and to determine if her surgery should be postponed.  The pt states that she is schedule for her pre op visit on Monday morning and will discuss with surgeon at that time.

## 2014-11-29 ENCOUNTER — Encounter (HOSPITAL_COMMUNITY): Payer: Self-pay

## 2014-11-29 ENCOUNTER — Other Ambulatory Visit: Payer: Self-pay

## 2014-11-29 ENCOUNTER — Telehealth: Payer: Self-pay | Admitting: *Deleted

## 2014-11-29 ENCOUNTER — Encounter (HOSPITAL_COMMUNITY)
Admission: RE | Admit: 2014-11-29 | Discharge: 2014-11-29 | Disposition: A | Discharge status: Home / Self Care | Payer: Medicare Other | Source: Ambulatory Visit | Attending: Obstetrics & Gynecology | Admitting: Obstetrics & Gynecology

## 2014-11-29 ENCOUNTER — Other Ambulatory Visit: Payer: Self-pay | Admitting: Obstetrics & Gynecology

## 2014-11-29 DIAGNOSIS — Z01810 Encounter for preprocedural cardiovascular examination: Secondary | ICD-10-CM | POA: Insufficient documentation

## 2014-11-29 HISTORY — DX: Lymphedema, not elsewhere classified: I89.0

## 2014-11-29 NOTE — Telephone Encounter (Signed)
Noted, once they have her controlled we will reschedule surgery I talked with anesthesia ahead of time and knew if she was in Afib at the time she would need rescheduleing

## 2014-11-29 NOTE — Pre-Procedure Instructions (Signed)
Patient given information to sign up for my chart at home. 

## 2014-11-29 NOTE — Telephone Encounter (Signed)
Left message for pt to return call x 3/cp

## 2014-11-29 NOTE — Telephone Encounter (Signed)
Pt states went for Pre op at [facility] and was told would need to cancel the surgery with Dr. Elonda Husky per anesthesiologist due to tachycardia. Pt to f/u with her Cardiologist.

## 2014-11-29 NOTE — Pre-Procedure Instructions (Signed)
In for PAT prior to [facility] and Hysteroscopy. Patient stares she went to her PCP late last week for a sinus infection and they told her that her heart was "out of rhythm again." She called her cardiologist who was closed on Friday and left message for them to call back. States th NP called her back yesterday and will talk to cardiologist as to whether to proceed with surgery or not. Patient states she has been in and out of A Fib /  Flutter for years and had a cardioversion done on 10/21/2014. Post EKG shows SR with 1 Degree A-V block. Repeat EKG today shows A Fib with Rapid ventricular response and rate of 106. Dr Patsey Berthold notified and shown these changes and wants patient to be re-evaluated and better controlled prior to her surgery. Called Dr Brynda Greathouse office and spoke with Fontaine No NP. She will let Dr Elonda Husky know procedure is cancelled for now.

## 2014-11-30 ENCOUNTER — Telehealth: Payer: Self-pay | Admitting: Nurse Practitioner

## 2014-11-30 NOTE — Telephone Encounter (Signed)
Returned pt call - stated she has been checking her pulse at home and maybe "in sinus" now pulse jumping from 60 to 90 to 80 to 60 but really nothing over 100. Patient cannot tell when she is in afib or not. Encouraged patient to keep her appointment with donna tomorrow as she may be still in afib just with lower rates and to bring her pulse log with her to the appointment.  Patient was satisfied with conversation and will bring log with her.

## 2014-11-30 NOTE — Telephone Encounter (Signed)
Called pt to confirm appt, has questions and would like a call from [facility]

## 2014-12-01 ENCOUNTER — Encounter (HOSPITAL_COMMUNITY): Admission: RE | Payer: Self-pay | Source: Ambulatory Visit

## 2014-12-01 ENCOUNTER — Encounter: Payer: Self-pay | Admitting: Nurse Practitioner

## 2014-12-01 ENCOUNTER — Ambulatory Visit (HOSPITAL_COMMUNITY)
Admission: RE | Admit: 2014-12-01 | Payer: Medicare Other | Source: Ambulatory Visit | Admitting: Obstetrics & Gynecology

## 2014-12-01 ENCOUNTER — Telehealth: Payer: Self-pay | Admitting: Nurse Practitioner

## 2014-12-01 ENCOUNTER — Ambulatory Visit (INDEPENDENT_AMBULATORY_CARE_PROVIDER_SITE_OTHER): Payer: Medicare Other | Admitting: Nurse Practitioner

## 2014-12-01 VITALS — BP 118/72 | HR 122 | Ht 63.0 in | Wt 192.1 lb

## 2014-12-01 DIAGNOSIS — I4892 Unspecified atrial flutter: Secondary | ICD-10-CM

## 2014-12-01 DIAGNOSIS — I4891 Unspecified atrial fibrillation: Secondary | ICD-10-CM | POA: Diagnosis not present

## 2014-12-01 SURGERY — DILATATION AND CURETTAGE /HYSTEROSCOPY
Anesthesia: General

## 2014-12-01 SURGERY — Surgical Case
Anesthesia: *Unknown

## 2014-12-01 MED ORDER — DILTIAZEM HCL ER COATED BEADS 300 MG PO CP24: 300.0000 mg | ORAL_CAPSULE | Freq: Every day | ORAL | Status: DC

## 2014-12-01 MED ORDER — AMIODARONE HCL 200 MG PO TABS: 200.0000 mg | ORAL_TABLET | Freq: Every day | ORAL | Status: DC

## 2014-12-01 NOTE — Telephone Encounter (Signed)
New Msg      Pt c/o medication issue:  1. Name of Medication: Amiodarone  2. How are you currently taking this medication (dosage and times per day)? 1 daily  3. Are you having a reaction (difficulty breathing--STAT)? No   4. What is your medication issue? Is this prescription replacing Flecainide?   Please call and clarify.

## 2014-12-01 NOTE — Progress Notes (Addendum)
Primary Care Physician: [name], MD Referring Physician:  Dr Harl Bowie GYN: Dr. Darlis Loan   Heidi Lopez is a 69 y.o. female with a h/o recently diagnosed atrial fibrillation who presents for follow up EP evaluation..  She has a h/o breast CA and underwent XRT in 2010 and again in 2011.  She reports that her symptoms of AF began in 2011 after her XRT.  She has tachypalpitations which are more prominent at night.  Episodes typically last several minutes at a time.  She has episodes most evenings.  She has been evaluated by Dr Harl Bowie and has documented afib with RVR as the cause.  She was  placed on diltiazem.  Dose titration has been limited by edema.  She was on digoxin and was discontinued. Her chads2vasc score is at least 2.  She is anticoagulated with eliquis.  She is tolerating this without difficulty.  She is unaware of triggers or precipitants for her afib.    When seen by Dr. [name] 1/4 she was started on Flecainide 50 mg bid.  Flecainide was increased to 100 mg bid  because she has not seen any improvement in afib symptoms and actually thought episodes may be worse. Peak flecainide level on 100 mg bid was in range at 0.51. She was then set up for DCCV 10/21/14. Held in rhythm x one week then fast heart rate resumed. Feels like she was back in rhythm 2/16 to 3/2 until she developed sinus infection. Recently also had abnormal pap smear and was scheduled to have a D&C but pre op EKG showed rapid flutter. Surgery was cancelled.  EKG in office today shows aflutter at 120 bpm, continues on flecainide and diltiazem. Tolerating rapid rate well.  Today, she denies symptoms of  chest pain,   orthopnea, PND, lower extremity edema, dizziness, presyncope, syncope, or neurologic sequela.The patient is tolerating medications without difficulties and is otherwise without complaint today.   Past Medical History  Diagnosis Date  . Cancer of breast July 2010    s/p XRT 2010 and again in 2011  .  Glaucoma   . Cramps, muscle, general     severe breast cramping   . Night sweats   . Pain     breast pain  . Chronic coughing   . Sinus problem   . Wears glasses   . Glaucoma     left eye   . Joint pain     foot and knee pain  . Paroxysmal atrial fibrillation 05/2014  . Dysrhythmia     atrial flutter  . Lymphedema of arm     left   Past Surgical History  Procedure Laterality Date  . Tubal ligation  1984  . Colonoscopy N/A 02/05/2013    Procedure: COLONOSCOPY;  Surgeon: [name], MD;  Location: [facility];  Service: Endoscopy;  Laterality: N/A;  830  . Cardioversion N/A 10/21/2014    Procedure: CARDIOVERSION;  Surgeon: [name], MD;  Location: [facility] ENDOSCOPY;  Service: Cardiovascular;  Laterality: N/A;  . Breast surgery  2010- right and 2011-left    for cancer    Current Outpatient Prescriptions  Medication Sig Dispense Refill  . albuterol (PROVENTIL HFA;VENTOLIN HFA) 108 (90 BASE) MCG/ACT inhaler Inhale into the lungs every 6 (six) hours as needed for wheezing or shortness of breath.    Marland Kitchen apixaban (ELIQUIS) 5 MG TABS tablet Take 1 tablet (5 mg total) by mouth 2 (two) times daily. 180 tablet 3  .  brimonidine (ALPHAGAN) 0.2 % ophthalmic solution Place 1 drop into the left eye 2 (two) times daily.     . Calcium Carbonate-Vit D-Min (CALTRATE 600+D PLUS PO) Take 1 tablet by mouth daily.    . Cholecalciferol (VITAMIN D PO) Take 2,000 Units by mouth daily.     . Coenzyme Q10 (COQ-10) 200 MG CAPS Take 1 capsule by mouth daily.    . Cranberry 500 MG CAPS Take 500 mg by mouth daily.      . fexofenadine (ALLEGRA) 180 MG tablet Take 180 mg by mouth daily as needed for allergies or rhinitis.    . furosemide (LASIX) 20 MG tablet Take 1 tablet (20 mg total) by mouth daily as needed. Take daily as needed for swelling 90 tablet 3  . LUMIGAN 0.01 % SOLN Place 1 drop into both eyes at bedtime.    . Multiple Vitamin (MULTIVITAMIN) tablet Take 1 tablet by mouth daily.     . Omega-3  Fatty Acids (FISH OIL) 1200 MG CAPS Take 1 capsule by mouth daily.    . tamoxifen (NOLVADEX) 10 MG tablet Take 10 mg daily. (Patient taking differently: Take 10 mg by mouth daily.) 90 tablet 3  . [START ON 12/05/2014] amiodarone (PACERONE) 200 MG tablet Take 1 tablet (200 mg total) by mouth daily. 30 tablet 1  . cefdinir (OMNICEF) 300 MG capsule     . diltiazem (CARDIZEM CD) 300 MG 24 hr capsule Take 1 capsule (300 mg total) by mouth daily. 90 capsule 3  . enoxaparin (LOVENOX) 80 MG/0.8ML injection Inject 0.8 mLs (80 mg total) into the skin once. 1 Syringe 0   No current facility-administered medications for this visit.    Allergies  Allergen Reactions  . Metoprolol     Makes her feel bad  . Reglan [Metoclopramide] Other (See Comments)    Causes severe jitters    History   Social History  . Marital Status: Widowed    Spouse Name: N/A  . Number of Children: N/A  . Years of Education: N/A   Occupational History  . Not on file.   Social History Main Topics  . Smoking status: Former Smoker -- 1.00 packs/day for 2 years    Types: Cigarettes    Start date: 09/17/1962    Quit date: 11/16/1966  . Smokeless tobacco: Never Used  . Alcohol Use: No  . Drug Use: No  . Sexual Activity: Yes    Birth Control/ Protection: Post-menopausal   Other Topics Concern  . Not on file   Social History Narrative   Lives alone in [place]   Retired   Worked previously for [employer] and [employer]    Family History  Problem Relation Age of Onset  . Heart disease Mother   . Stroke Father   . Cancer Father   . Early death Brother   . Cancer Sister   . Cancer Sister   . Cancer Sister   . Diabetes Daughter     ROS- All systems are reviewed and negative except as per the HPI above  Physical Exam: Filed Vitals:   12/01/14 1056  BP: 118/72  Pulse: 122  Height: 5\' 3"  (1.6 m)  Weight: 192 lb 1.9 oz (87.145 kg)    GEN- The patient is well appearing, alert and oriented x 3 today.     Head- normocephalic, atraumatic Eyes-  Sclera clear, conjunctiva pink Ears- hearing intact Oropharynx- clear Neck- supple, no JVP Lymph- no cervical lymphadenopathy Lungs- Clear to ausculation bilaterally, normal  work of breathing Heart- Regular rapid rate and rhythm, no murmurs, rubs or gallops, PMI not laterally displaced GI- soft, NT, ND, + BS Extremities- no clubbing, cyanosis, or edema MS- no significant deformity or atrophy Skin- no rash or lesion Psych- euthymic mood, full affect Neuro- strength and sensation are intact  EKG today reveals aflutter 120 bpm. Echo is reviewed Epic records reviewed.  Assessment and Plan:  1.  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   Has failed DCCV 2/16 and GYN surgery postponed due to presenting with RVR If scheduled for  Afib/flutter ablation, would delay surgery for possibly 4 months due to NOAC not being able to be interrupted x 3 months following procedure..  Talked to her GYN Dr. Elonda Husky and he would like to proceed with surgery prior to that time. Dr. [name] suggests to rate control and get thru surgery then ablation can be addressed later. Will stop flecainide x 3 days and start amiodarone 200 mg daily stating on Sunday and increase diltiazem to 300 mg for rate control. She did experience pedal edema with that dose previously, but thinks she can tolerate short term.   2. Snoring She is overweight and thinks that she snores I have advised sleep study which she declines at this time  F/u office in one week for further evaluation of rate/rhythm and advise on if ready to pursue surgery.  Pt seen in collaboration with Dr. [name].  Marland Kitchen

## 2014-12-01 NOTE — Patient Instructions (Signed)
Your physician recommends that you schedule a follow-up appointment on next Friday for Nurse visit/ekg  Your physician has recommended you make the following change in your medication:  1) Stop Flecainide 2)Increase Cardizem to 300mg  daily 3)Start Amiodarone 200mg  daily ON Sunday

## 2014-12-01 NOTE — Telephone Encounter (Signed)
Notified [facility] that amiodarone is replacing flecainide. She is supposed to start the amiodarone on Sunday.

## 2014-12-02 ENCOUNTER — Other Ambulatory Visit: Payer: Medicare Other

## 2014-12-06 ENCOUNTER — Ambulatory Visit: Payer: Medicare Other | Admitting: Internal Medicine

## 2014-12-06 ENCOUNTER — Telehealth: Payer: Self-pay | Admitting: *Deleted

## 2014-12-06 NOTE — Telephone Encounter (Signed)
Spoke with patient - she states that she went "into rhythm" after stopping flecainide and increasing diltazem and did not start amiodarone as she was prescribed since she went into rhythm and had read the side effects of the medication and had reservations.   Reviewed with Roderic Palau, NP and said to keep taking diltazem 300mg  until we see her on Thursday. Patient agreeable with plan

## 2014-12-08 ENCOUNTER — Encounter: Payer: Medicare Other | Admitting: Obstetrics & Gynecology

## 2014-12-09 ENCOUNTER — Encounter: Payer: Self-pay | Admitting: Nurse Practitioner

## 2014-12-09 ENCOUNTER — Ambulatory Visit (INDEPENDENT_AMBULATORY_CARE_PROVIDER_SITE_OTHER): Payer: Medicare Other | Admitting: Nurse Practitioner

## 2014-12-09 ENCOUNTER — Telehealth: Payer: Self-pay | Admitting: Obstetrics & Gynecology

## 2014-12-09 VITALS — BP 110/68 | HR 74

## 2014-12-09 DIAGNOSIS — I480 Paroxysmal atrial fibrillation: Secondary | ICD-10-CM | POA: Diagnosis not present

## 2014-12-09 NOTE — Progress Notes (Signed)
Patient here for follow up EKG. Reviewed with Roderic Palau, NP in sinus rhythm. No new orders given. Patient did not start amiodarone --- will remove this from her medication list. Appointment made for 6 week follow up visit

## 2014-12-13 ENCOUNTER — Encounter (HOSPITAL_COMMUNITY): Payer: Medicare Other

## 2014-12-15 ENCOUNTER — Ambulatory Visit (INDEPENDENT_AMBULATORY_CARE_PROVIDER_SITE_OTHER): Payer: Medicare Other | Admitting: Obstetrics & Gynecology

## 2014-12-15 ENCOUNTER — Encounter: Payer: Self-pay | Admitting: Obstetrics & Gynecology

## 2014-12-15 VITALS — BP 120/90 | HR 76 | Wt 194.0 lb

## 2014-12-15 DIAGNOSIS — R87619 Unspecified abnormal cytological findings in specimens from cervix uteri: Secondary | ICD-10-CM

## 2014-12-15 DIAGNOSIS — R938 Abnormal findings on diagnostic imaging of other specified body structures: Secondary | ICD-10-CM

## 2014-12-15 DIAGNOSIS — R9389 Abnormal findings on diagnostic imaging of other specified body structures: Secondary | ICD-10-CM

## 2014-12-15 NOTE — Progress Notes (Signed)
Patient ID: Heidi Lopez, female   DOB: 25-Nov-1945, 69 y.o.   MRN: [id number] Preoperative History and Physical  Heidi Lopez is a 69 y.o. No obstetric history on file. with No LMP recorded. Patient is postmenopausal. admitted for a hysteroscopy uterine curettage due to atypical glandular cells on Pap which led to a sonogram revealing a very thickened and complex endometrium.  On tamoxifen has had breast cancer bilaterally  PMH:    Past Medical History  Diagnosis Date  . Cancer of breast July 2010    s/p XRT 2010 and again in 2011  . Glaucoma   . Cramps, muscle, general     severe breast cramping   . Night sweats   . Pain     breast pain  . Chronic coughing   . Sinus problem   . Wears glasses   . Glaucoma     left eye   . Joint pain     foot and knee pain  . Paroxysmal atrial fibrillation 05/2014  . Dysrhythmia     atrial flutter  . Lymphedema of arm     left    PSH:     Past Surgical History  Procedure Laterality Date  . Tubal ligation  1984  . Colonoscopy N/A 02/05/2013    Procedure: COLONOSCOPY;  Surgeon: [name], MD;  [facility];  Service: Endoscopy;  Laterality: N/A;  830  . Cardioversion N/A 10/21/2014    Procedure: CARDIOVERSION;  Surgeon: [name], MD;  Location: [place];  Service: Cardiovascular;  Laterality: N/A;  . Breast surgery  2010- right and 2011-left    for cancer    POb/GynH:      OB History    No data available      SH:   History  Substance Use Topics  . Smoking status: Former Smoker -- 1.00 packs/day for 2 years    Types: Cigarettes    Start date: 09/17/1962    Quit date: 11/16/1966  . Smokeless tobacco: Never Used  . Alcohol Use: No    FH:    Family History  Problem Relation Age of Onset  . Heart disease Mother   . Stroke Father   . Cancer Father   . Early death Brother   . Cancer Sister   . Cancer Sister   . Cancer Sister   . Diabetes Daughter      Allergies:  Allergies  Allergen  Reactions  . Metoprolol     Makes her feel bad  . Reglan [Metoclopramide] Other (See Comments)    Causes severe jitters    Medications:       Current outpatient prescriptions:  .  apixaban (ELIQUIS) 5 MG TABS tablet, Take 1 tablet (5 mg total) by mouth 2 (two) times daily., Disp: 180 tablet, Rfl: 3 .  brimonidine (ALPHAGAN) 0.2 % ophthalmic solution, Place 1 drop into the left eye 2 (two) times daily. , Disp: , Rfl:  .  Calcium Carbonate-Vit D-Min (CALTRATE 600+D PLUS PO), Take 1 tablet by mouth daily., Disp: , Rfl:  .  Cholecalciferol (VITAMIN D PO), Take 2,000 Units by mouth daily. , Disp: , Rfl:  .  Coenzyme Q10 (COQ-10) 200 MG CAPS, Take 1 capsule by mouth daily., Disp: , Rfl:  .  Cranberry 500 MG CAPS, Take 500 mg by mouth daily.  , Disp: , Rfl:  .  diltiazem (CARDIZEM CD) 300 MG 24 hr capsule, Take 1 capsule (300 mg total) by mouth daily.,  Disp: 90 capsule, Rfl: 3 .  fexofenadine (ALLEGRA) 180 MG tablet, Take 180 mg by mouth daily as needed for allergies or rhinitis., Disp: , Rfl:  .  LUMIGAN 0.01 % SOLN, Place 1 drop into both eyes at bedtime., Disp: , Rfl:  .  Multiple Vitamin (MULTIVITAMIN) tablet, Take 1 tablet by mouth daily. , Disp: , Rfl:  .  Omega-3 Fatty Acids (FISH OIL) 1200 MG CAPS, Take 1 capsule by mouth daily., Disp: , Rfl:  .  tamoxifen (NOLVADEX) 10 MG tablet, Take 10 mg daily. (Patient taking differently: Take 10 mg by mouth daily.), Disp: 90 tablet, Rfl: 3 .  albuterol (PROVENTIL HFA;VENTOLIN HFA) 108 (90 BASE) MCG/ACT inhaler, Inhale into the lungs every 6 (six) hours as needed for wheezing or shortness of breath., Disp: , Rfl:  .  enoxaparin (LOVENOX) 80 MG/0.8ML injection, Inject 0.8 mLs (80 mg total) into the skin once. (Patient not taking: Reported on 12/15/2014), Disp: 1 Syringe, Rfl: 0 .  furosemide (LASIX) 20 MG tablet, Take 1 tablet (20 mg total) by mouth daily as needed. Take daily as needed for swelling (Patient not taking: Reported on 12/15/2014), Disp: 90  tablet, Rfl: 3  Review of Systems:   Review of Systems  Constitutional: Negative for fever, chills, weight loss, malaise/fatigue and diaphoresis.  HENT: Negative for hearing loss, ear pain, nosebleeds, congestion, sore throat, neck pain, tinnitus and ear discharge.   Eyes: Negative for blurred vision, double vision, photophobia, pain, discharge and redness.  Respiratory: Negative for cough, hemoptysis, sputum production, shortness of breath, wheezing and stridor.   Cardiovascular: Negative for chest pain, palpitations, orthopnea, claudication, leg swelling and PND.  Gastrointestinal: negative for abdominal pain. Negative for heartburn, nausea, vomiting, diarrhea, constipation, blood in stool and melena.  Genitourinary: Negative for dysuria, urgency, frequency, hematuria and flank pain.  Musculoskeletal: Negative for myalgias, back pain, joint pain and falls.  Skin: Negative for itching and rash.  Neurological: Negative for dizziness, tingling, tremors, sensory change, speech change, focal weakness, seizures, loss of consciousness, weakness and headaches.  Endo/Heme/Allergies: Negative for environmental allergies and polydipsia. Does not bruise/bleed easily.  Psychiatric/Behavioral: Negative for depression, suicidal ideas, hallucinations, memory loss and substance abuse. The patient is not nervous/anxious and does not have insomnia.      PHYSICAL EXAM:  Blood pressure 120/90, pulse 76, weight 194 lb (87.998 kg).    Vitals reviewed. Constitutional: She is oriented to person, place, and time. She appears well-developed and well-nourished.  HENT:  Head: Normocephalic and atraumatic.  Right Ear: External ear normal.  Left Ear: External ear normal.  Nose: Nose normal.  Mouth/Throat: Oropharynx is clear and moist.  Eyes: Conjunctivae and EOM are normal. Pupils are equal, round, and reactive to light. Right eye exhibits no discharge. Left eye exhibits no discharge. No scleral icterus.   Neck: Normal range of motion. Neck supple. No tracheal deviation present. No thyromegaly present.  Cardiovascular: Normal rate, regular rhythm, normal heart sounds and intact distal pulses.  Exam reveals no gallop and no friction rub.   No murmur heard. Respiratory: Effort normal and breath sounds normal. No respiratory distress. She has no wheezes. She has no rales. She exhibits no tenderness.  GI: Soft. Bowel sounds are normal. She exhibits no distension and no mass. There is tenderness. There is no rebound and no guarding.  Genitourinary:       Vulva is normal without lesions Vagina is pink moist without discharge Cervix normal in appearance and pap is normal Uterus is normal Adnexa  is negative with normal sized ovaries by sonogram  Musculoskeletal: Normal range of motion. She exhibits no edema and no tenderness.  Neurological: She is alert and oriented to person, place, and time. She has normal reflexes. She displays normal reflexes. No cranial nerve deficit. She exhibits normal muscle tone. Coordination normal.  Skin: Skin is warm and dry. No rash noted. No erythema. No pallor.  Psychiatric: She has a normal mood and affect. Her behavior is normal. Judgment and thought content normal.    Labs: No results found for this or any previous visit (from the past 336 hour(s)).  EKG: Orders placed or performed in visit on 12/09/14  . EKG 12-Lead    Imaging Studies: US Transvaginal Non-ob  2014-12-11   GYNECOLOGIC SONOGRAM   BRITTNEE GAETANO is a 69 y.o. for a pelvic sonogram for abnl pap smear  results with endometrial cells present. Pt H/O Breast Ca on Tamoxifen. Pt  notes no vaginal bleeding although pelvic cramping present.  Uterus                      9.1 x 6.6 x 5.6 cm, anteverted  Endometrium          28.7 mm, symmetrical, cystic thickened with +Doppler  flow noted within  Right ovary             ?? Unable to positively identify ovarian tissue  although no adnexal masses noted  Left ovary                 2.2 x 1.1 x 0.9 cm,   No free fluid or adnexal masses noted within the pelvis  Technician Comments:  Anteverted uterus, ENDOMETRIUM-28.35mm thickened cystic with +Doppler flow  noted within, bilateral adnexa appears WNL, no free fluid or adnexal  masses noted within the pelvis   Lazarus Gowda 12-11-2014 3:09 PM  Clinical Impression and recommendations:  I have reviewed the sonogram results above, combined with the patient's  current clinical course, below are my impressions and any appropriate  recommendations for management based on the sonographic findings.  Complex heterogenous cystic endometrium Normal uterus otherwise Normal ovaries   EURE,LUTHER H 11-Dec-2014 3:34 PM        Assessment: Patient Active Problem List   Diagnosis Date Noted  . Atypical glandular cells of undetermined significance (AGUS) on cervical Pap smear 12-11-14  . Thickened endometrium 11-Dec-2014  . Use of tamoxifen (Nolvadex) 11-Dec-2014  . Snoring 09/20/2014  . A-fib 06/18/2014  . Pain in joint, ankle and foot 10/16/2011  . History of breast cancer, bilateral: Right T1N0 Lumpectomy July 2010, Left T1c, N0 Lumpectomy 04/26/2010. 05/04/2011    Plan: Hysteroscopy uterine curettage  EURE,LUTHER H 12/15/2014 2:48 PM

## 2014-12-16 ENCOUNTER — Telehealth: Payer: Self-pay | Admitting: Obstetrics & Gynecology

## 2014-12-16 NOTE — Telephone Encounter (Signed)
Pt states she is having blood work on 12/23/14 at the [facility] and [place] labs on 12/24/2014 for her surgery with Dr. Elonda Husky. Pt states would the labs from the [place] be sufficient for the Pre op labs. Called the [place] and was told the only blood work pt was having was for her Cancer Markers. Pt informed would need to keep her appt for Pre op labs as well as her [place] appt. Pt verbalized understanding.

## 2014-12-22 NOTE — Patient Instructions (Signed)
Heidi Lopez  12/22/2014   Your procedure is scheduled on:   12/29/2014  Report to [facility] at 700  AM.  Call this number if you have problems the morning of surgery: [phone]   Remember:   Do not eat food or drink liquids after midnight.   Take these medicines the morning of surgery with A SIP OF WATER:  Diltiazem, allegra   Do not wear jewelry, make-up or nail polish.  Do not wear lotions, powders, or perfumes.   Do not shave 48 hours prior to surgery. Men may shave face and neck.  Do not bring valuables to the hospital.  [facility] is not responsible for any belongings or valuables.               Contacts, dentures or bridgework may not be worn into surgery.  Leave suitcase in the car. After surgery it may be brought to your room.  For patients admitted to the hospital, discharge time is determined by your treatment team.               Patients discharged the day of surgery will not be allowed to drive home.  Name and phone number of your driver: family  Special Instructions: Shower using CHG 2 nights before surgery and the night before surgery.  If you shower the day of surgery use CHG.  Use special wash - you have one bottle of CHG for all showers.  You should use approximately 1/3 of the bottle for each shower.   Please read over the following fact sheets that you were given: Pain Booklet, Coughing and Deep Breathing, Surgical Site Infection Prevention, Anesthesia Post-op Instructions and Care and Recovery After Surgery Hysteroscopy Hysteroscopy is a procedure used for looking inside the womb (uterus). It may be done for various reasons, including:  To evaluate abnormal bleeding, fibroid (benign, noncancerous) tumors, polyps, scar tissue (adhesions), and possibly cancer of the uterus.  To look for lumps (tumors) and other uterine growths.  To look for causes of why a woman cannot get pregnant (infertility), causes of recurrent loss of pregnancy (miscarriages),  or a lost intrauterine device (IUD).  To perform a sterilization by blocking the fallopian tubes from inside the uterus. In this procedure, a thin, flexible tube with a tiny light and camera on the end of it (hysteroscope) is used to look inside the uterus. A hysteroscopy should be done right after a menstrual period to be sure you are not pregnant. LET [facility] CARE PROVIDER KNOW ABOUT:   Any allergies you have.  All medicines you are taking, including vitamins, herbs, eye drops, creams, and over-the-counter medicines.  Previous problems you or members of your family have had with the use of anesthetics.  Any blood disorders you have.  Previous surgeries you have had.  Medical conditions you have. RISKS AND COMPLICATIONS  Generally, this is a safe procedure. However, as with any procedure, complications can occur. Possible complications include:  Putting a hole in the uterus.  Excessive bleeding.  Infection.  Damage to the cervix.  Injury to other organs.  Allergic reaction to medicines.  Too much fluid used in the uterus for the procedure. BEFORE THE PROCEDURE   Ask your health care provider about changing or stopping any regular medicines.  Do not take aspirin or blood thinners for 1 week before the procedure, or as directed by your health care provider. These can cause bleeding.  If you smoke,  do not smoke for 2 weeks before the procedure.  In some cases, a medicine is placed in the cervix the day before the procedure. This medicine makes the cervix have a larger opening (dilate). This makes it easier for the instrument to be inserted into the uterus during the procedure.  Do not eat or drink anything for at least 8 hours before the surgery.  Arrange for someone to take you home after the procedure. PROCEDURE   You may be given a medicine to relax you (sedative). You may also be given one of the following:  A medicine that numbs the area around the cervix  (local anesthetic).  A medicine that makes you sleep through the procedure (general anesthetic).  The hysteroscope is inserted through the vagina into the uterus. The camera on the hysteroscope sends a picture to a TV screen. This gives the surgeon a good view inside the uterus.  During the procedure, air or a liquid is put into the uterus, which allows the surgeon to see better.  Sometimes, tissue is gently scraped from inside the uterus. These tissue samples are sent to a lab for testing. AFTER THE PROCEDURE   If you had a general anesthetic, you may be groggy for a couple hours after the procedure.  If you had a local anesthetic, you will be able to go home as soon as you are stable and feel ready.  You may have some cramping. This normally lasts for a couple days.  You may have bleeding, which varies from light spotting for a few days to menstrual-like bleeding for 3-7 days. This is normal.  If your test results are not back during the visit, make an appointment with your health care provider to find out the results. Document Released: 12/10/2000 Document Revised: 06/24/2013 Document Reviewed: 04/02/2013 [facility] Patient Information 2015 [place], [place]. This information is not intended to replace advice given to you by your health care provider. Make sure you discuss any questions you have with your health care provider. Dysfunctional Uterine Bleeding Normally, menstrual periods begin between ages 45 to 63 in young women. A normal menstrual cycle/period may begin every 23 days up to 35 days and lasts from 1 to 7 days. Around 12 to 14 days before your menstrual period starts, ovulation (ovary produces an egg) occurs. When counting the time between menstrual periods, count from the first day of bleeding of the previous period to the first day of bleeding of the next period. Dysfunctional (abnormal) uterine bleeding is bleeding that is different from a normal menstrual period. Your periods  may come earlier or later than usual. They may be lighter, have blood clots or be heavier. You may have bleeding between periods, or you may skip one period or more. You may have bleeding after sexual intercourse, bleeding after menopause, or no menstrual period. CAUSES   Pregnancy (normal, miscarriage, tubal).  IUDs (intrauterine device, birth control).  Birth control pills.  Hormone treatment.  Menopause.  Infection of the cervix.  Blood clotting problems.  Infection of the inside lining of the uterus.  Endometriosis, inside lining of the uterus growing in the pelvis and other female organs.  Adhesions (scar tissue) inside the uterus.  Obesity or severe weight loss.  Uterine polyps inside the uterus.  Cancer of the vagina, cervix, or uterus.  Ovarian cysts or polycystic ovary syndrome.  Medical problems (diabetes, thyroid disease).  Uterine fibroids (noncancerous tumor).  Problems with your female hormones.  Endometrial hyperplasia, very thick lining and  enlarged cells inside of the uterus.  Medicines that interfere with ovulation.  Radiation to the pelvis or abdomen.  Chemotherapy. DIAGNOSIS   Your doctor will discuss the history of your menstrual periods, medicines you are taking, changes in your weight, stress in your life, and any medical problems you may have.  Your doctor will do a physical and pelvic examination.  Your doctor may want to perform certain tests to make a diagnosis, such as:  Pap test.  Blood tests.  Cultures for infection.  CT scan.  Ultrasound.  Hysteroscopy.  Laparoscopy.  MRI.  Hysterosalpingography.  D and C.  Endometrial biopsy. TREATMENT  Treatment will depend on the cause of the dysfunctional uterine bleeding (DUB). Treatment may include:  Observing your menstrual periods for a couple of months.  Prescribing medicines for medical problems, including:  Antibiotics.  Hormones.  Birth control  pills.  Removing an IUD (intrauterine device, birth control).  Surgery:  D and C (scrape and remove tissue from inside the uterus).  Laparoscopy (examine inside the abdomen with a lighted tube).  Uterine ablation (destroy lining of the uterus with electrical current, laser, heat, or freezing).  Hysteroscopy (examine cervix and uterus with a lighted tube).  Hysterectomy (remove the uterus). HOME CARE INSTRUCTIONS   If medicines were prescribed, take exactly as directed. Do not change or switch medicines without consulting your caregiver.  Long term heavy bleeding may result in iron deficiency. Your caregiver may have prescribed iron pills. They help replace the iron that your body lost from heavy bleeding. Take exactly as directed.  Do not take aspirin or medicines that contain aspirin one week before or during your menstrual period. Aspirin may make the bleeding worse.  If you need to change your sanitary pad or tampon more than once every 2 hours, stay in bed with your feet elevated and a cold pack on your lower abdomen. Rest as much as possible, until the bleeding stops or slows down.  Eat well-balanced meals. Eat foods high in iron. Examples are:  Leafy green vegetables.  Whole-grain breads and cereals.  Eggs.  Meat.  Liver.  Do not try to lose weight until the abnormal bleeding has stopped and your blood iron level is back to normal. Do not lift more than ten pounds or do strenuous activities when you are bleeding.  For a couple of months, make note on your calendar, marking the start and ending of your period, and the type of bleeding (light, medium, heavy, spotting, clots or missed periods). This is for your caregiver to better evaluate your problem. SEEK MEDICAL CARE IF:   You develop nausea (feeling sick to your stomach) and vomiting, dizziness, or diarrhea while you are taking your medicine.  You are getting lightheaded or weak.  You have any problems that may  be related to the medicine you are taking.  You develop pain with your DUB.  You want to remove your IUD.  You want to stop or change your birth control pills or hormones.  You have any type of abnormal bleeding mentioned above.  You are over 23 years old and have not had a menstrual period yet.  You are 69 years old and you are still having menstrual periods.  You have any of the symptoms mentioned above.  You develop a rash. SEEK IMMEDIATE MEDICAL CARE IF:   An oral temperature above 102 F (38.9 C) develops.  You develop chills.  You are changing your sanitary pad or tampon more than  once an hour.  You develop abdominal pain.  You pass out or faint. Document Released: 08/31/2000 Document Revised: 11/26/2011 Document Reviewed: 08/02/2009 [facility] Patient Information 2015 [place], [place]. This information is not intended to replace advice given to you by your health care provider. Make sure you discuss any questions you have with your health care provider. PATIENT INSTRUCTIONS POST-ANESTHESIA  IMMEDIATELY FOLLOWING SURGERY:  Do not drive or operate machinery for the first twenty four hours after surgery.  Do not make any important decisions for twenty four hours after surgery or while taking narcotic pain medications or sedatives.  If you develop intractable nausea and vomiting or a severe headache please notify your doctor immediately.  FOLLOW-UP:  Please make an appointment with your surgeon as instructed. You do not need to follow up with anesthesia unless specifically instructed to do so.  WOUND CARE INSTRUCTIONS (if applicable):  Keep a dry clean dressing on the anesthesia/puncture wound site if there is drainage.  Once the wound has quit draining you may leave it open to air.  Generally you should leave the bandage intact for twenty four hours unless there is drainage.  If the epidural site drains for more than 36-48 hours please call the anesthesia  department.  QUESTIONS?:  Please feel free to call your physician or the hospital operator if you have any questions, and they will be happy to assist you.

## 2014-12-23 ENCOUNTER — Encounter (HOSPITAL_COMMUNITY): Payer: Self-pay | Admitting: Hematology & Oncology

## 2014-12-23 ENCOUNTER — Encounter (HOSPITAL_BASED_OUTPATIENT_CLINIC_OR_DEPARTMENT_OTHER): Payer: Medicare Other

## 2014-12-23 ENCOUNTER — Encounter (HOSPITAL_COMMUNITY): Payer: Medicare Other | Attending: Hematology & Oncology | Admitting: Hematology & Oncology

## 2014-12-23 VITALS — BP 144/82 | HR 72 | Temp 98.2°F | Resp 16 | Wt 194.7 lb

## 2014-12-23 DIAGNOSIS — Z853 Personal history of malignant neoplasm of breast: Secondary | ICD-10-CM | POA: Diagnosis present

## 2014-12-23 DIAGNOSIS — N951 Menopausal and female climacteric states: Secondary | ICD-10-CM | POA: Diagnosis not present

## 2014-12-23 LAB — CBC WITH DIFFERENTIAL/PLATELET
Basophils Absolute: 0 10*3/uL (ref 0.0–0.1)
Basophils Relative: 1 % (ref 0–1)
EOS PCT: 3 % (ref 0–5)
Eosinophils Absolute: 0.3 10*3/uL (ref 0.0–0.7)
HCT: 38.7 % (ref 36.0–46.0)
Hemoglobin: 12.9 g/dL (ref 12.0–15.0)
LYMPHS PCT: 35 % (ref 12–46)
Lymphs Abs: 2.6 10*3/uL (ref 0.7–4.0)
MCH: 30.6 pg (ref 26.0–34.0)
MCHC: 33.3 g/dL (ref 30.0–36.0)
MCV: 91.7 fL (ref 78.0–100.0)
Monocytes Absolute: 0.9 10*3/uL (ref 0.1–1.0)
Monocytes Relative: 12 % (ref 3–12)
NEUTROS PCT: 49 % (ref 43–77)
Neutro Abs: 3.7 10*3/uL (ref 1.7–7.7)
PLATELETS: 205 10*3/uL (ref 150–400)
RBC: 4.22 MIL/uL (ref 3.87–5.11)
RDW: 12.9 % (ref 11.5–15.5)
WBC: 7.5 10*3/uL (ref 4.0–10.5)

## 2014-12-23 LAB — COMPREHENSIVE METABOLIC PANEL
ALT: 37 U/L — ABNORMAL HIGH (ref 0–35)
ANION GAP: 8 (ref 5–15)
AST: 34 U/L (ref 0–37)
Albumin: 4 g/dL (ref 3.5–5.2)
Alkaline Phosphatase: 63 U/L (ref 39–117)
BILIRUBIN TOTAL: 0.4 mg/dL (ref 0.3–1.2)
BUN: 12 mg/dL (ref 6–23)
CHLORIDE: 105 mmol/L (ref 96–112)
CO2: 23 mmol/L (ref 19–32)
CREATININE: 0.71 mg/dL (ref 0.50–1.10)
Calcium: 8.9 mg/dL (ref 8.4–10.5)
GFR calc Af Amer: >90 mL/min (ref >90)
GFR, EST NON AFRICAN AMERICAN: 86 mL/min — AB (ref >90)
Glucose, Bld: 95 mg/dL (ref 70–99)
POTASSIUM: 4.1 mmol/L (ref 3.5–5.1)
SODIUM: 136 mmol/L (ref 135–145)
Total Protein: 6.8 g/dL (ref 6.0–8.3)

## 2014-12-23 NOTE — Patient Instructions (Signed)
[place] at [facility] Discharge Instructions  RECOMMENDATIONS MADE BY THE CONSULTANT AND ANY TEST RESULTS WILL BE SENT TO YOUR REFERRING PHYSICIAN.  Exam and discussion by Dr. Whitney Muse. Stop the tamoxifen for now. Will check a Breast Cancer Index to see what your risks are. Will make referral to the [facility] for evaluation of lymphedema.  Follow-up in 1 month with labs and office visit.  Thank you for choosing [place] at [facility] to provide your oncology and hematology care.  To afford each patient quality time with our provider, please arrive at least 15 minutes before your scheduled appointment time.    You need to re-schedule your appointment should you arrive 10 or more minutes late.  We strive to give you quality time with our providers, and arriving late affects you and other patients whose appointments are after yours.  Also, if you no show three or more times for appointments you may be dismissed from the clinic at the providers discretion.     Again, thank you for choosing [facility].  Our hope is that these requests will decrease the amount of time that you wait before being seen by our physicians.       _____________________________________________________________  Should you have questions after your visit to [facility], please contact our office at [phone] between the hours of 8:30 a.m. and 4:30 p.m.  Voicemails left after 4:30 p.m. will not be returned until the following business day.  For prescription refill requests, have your pharmacy contact our office.

## 2014-12-23 NOTE — Progress Notes (Signed)
LABS DRAWN

## 2014-12-23 NOTE — Progress Notes (Signed)
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 but for negative sentinel nodes. Estrogen receptor +95% progesterone receptor +96% Ki-67 marker low at 9% HER-2/neu not amplified status post lumpectomy followed by radiation therapy. Her surgery was 04/26/2010 and she started tamoxifen on 08/16/2010 after radiation therapy finished on 07/21/2010. She is having tremendous sweating and hot flashes .   Currently on Tamoxifen 10 mg daily started by Dr. 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 Estrogen receptors were 96% positive, progesterone  33% positive Ki-67 marker low 8% HER-2/neu negative and she did not receive any hormonal therapy after the radiation on the right side.  DEXA on 06/30/2012 with osteopenia but a noted INCREASE in BMD   CURRENT THERAPY: Tamoxifen 10 mg daily (Dr. Barnet Glasgow)  INTERVAL HISTORY: Heidi Lopez 69 y.o. female returns for follow-up of bilateral breast cancers. She reports going for a Pap smear which came back abnormal. She was noted on ultrasound to have a thickened endometrium. She has not had any vaginal bleeding. She is set up to have a D&C next week.  She has had some problems with atrial fibrillation. She is currently on eloquis. She denies any bleeding or bruising. Her rhythm is currently regular.  She is up-to-date on mammography and screening colonoscopy. She is here today for ongoing observation.  MEDICAL HISTORY: Past Medical History  Diagnosis Date  . Cancer of breast July 2010    s/p XRT 2010 and again in 2011  . Glaucoma   . Cramps, muscle, general     severe breast cramping   . Night sweats   . Pain     breast pain  . Chronic coughing   . Sinus problem    . Wears glasses   . Glaucoma     left eye   . Joint pain     foot and knee pain  . Paroxysmal atrial fibrillation 05/2014  . Dysrhythmia     atrial flutter  . Lymphedema of arm     left    has History of breast cancer, bilateral: Right T1N0 Lumpectomy July 2010, Left T1c, N0 Lumpectomy 04/26/2010.; Pain in joint, ankle and foot; A-fib; Snoring; Atypical glandular cells of undetermined significance (AGUS) on cervical Pap smear; Thickened endometrium; and Use of tamoxifen (Nolvadex) on her problem list.     is allergic to metoprolol and reglan.  Heidi Lopez does not currently have medications on file.  SURGICAL HISTORY: Past Surgical History  Procedure Laterality Date  . Tubal ligation  1984  . Colonoscopy N/A 02/05/2013    Procedure: COLONOSCOPY;  Surgeon: [name], MD;  Location: [facility];  Service: Endoscopy;  Laterality: N/A;  830  . Cardioversion N/A 10/21/2014    Procedure: CARDIOVERSION;  Surgeon: [name], MD;  Location: [facility] END[facility]COPY;  Service: Cardiovascular;  Laterality: N/A;  . Breast surgery  2010- right and 2011-left    for cancer    SOCIAL HISTORY: History   Social History  . Marital Status: Widowed    Spouse Name: N/A  . Number of Children: N/A  . Years of Education: N/A   Occupational History  . Not on file.   Social History Main Topics  . Smoking  status: Former Smoker -- 1.00 packs/day for 2 years    Types: Cigarettes    Start date: 09/17/1962    Quit date: 11/16/1966  . Smokeless tobacco: Never Used  . Alcohol Use: No  . Drug Use: No  . Sexual Activity: Yes    Birth Control/ Protection: Post-menopausal   Other Topics Concern  . Not on file   Social History Narrative   Lives alone in [place]   Retired   Worked previously for [employer] and [employer]    FAMILY HISTORY: Family History  Problem Relation Age of Onset  . Heart disease Mother   . Stroke Father   . Cancer Father   . Early death Brother   . Cancer Sister     . Cancer Sister   . Cancer Sister   . Diabetes Daughter     Review of Systems  Constitutional: Negative for fever, chills, weight loss and malaise/fatigue.  HENT: Negative for congestion, hearing loss, nosebleeds, sore throat and tinnitus.   Eyes: Negative for blurred vision, double vision, pain and discharge.  Respiratory: Negative for cough, hemoptysis, sputum production, shortness of breath and wheezing.   Cardiovascular: Negative for chest pain, palpitations, claudication, leg swelling and PND.  Gastrointestinal: Negative for heartburn, nausea, vomiting, abdominal pain, diarrhea, constipation, blood in stool and melena.  Genitourinary: Negative for dysuria, urgency, frequency and hematuria.  Musculoskeletal: Negative for myalgias, joint pain and falls.  Skin: Negative for itching and rash.  Neurological: Negative for dizziness, tingling, tremors, sensory change, speech change, focal weakness, seizures, loss of consciousness, weakness and headaches.  Endo/Heme/Allergies: Does not bruise/bleed easily.  Psychiatric/Behavioral: Negative for depression, suicidal ideas, memory loss and substance abuse. The patient is not nervous/anxious and does not have insomnia.     PHYSICAL EXAMINATION  ECOG PERFORMANCE STATUS: 0 - Asymptomatic  There were no vitals filed for this visit.  Physical Exam  Constitutional: She is oriented to person, place, and time and well-developed, well-nourished, and in no distress.  HENT:  Head: Normocephalic and atraumatic.  Nose: Nose normal.  Mouth/Throat: Oropharynx is clear and moist. No oropharyngeal exudate.  Eyes: Conjunctivae and EOM are normal. Pupils are equal, round, and reactive to light. Right eye exhibits no discharge. Left eye exhibits no discharge. No scleral icterus.  Neck: Normal range of motion. Neck supple. No tracheal deviation present. No thyromegaly present.  Cardiovascular: Normal rate, regular rhythm and normal heart sounds.  Exam  reveals no gallop and no friction rub.   No murmur heard. Pulmonary/Chest: Effort normal and breath sounds normal. She has no wheezes. She has no rales.    Abdominal: Soft. Bowel sounds are normal. She exhibits no distension and no mass. There is no tenderness. There is no rebound and no guarding.  Musculoskeletal: Normal range of motion. She exhibits no edema.  Lymphadenopathy:    She has no cervical adenopathy.  Neurological: She is alert and oriented to person, place, and time. She has normal reflexes. No cranial nerve deficit. Gait normal. Coordination normal.  Skin: Skin is warm and dry. No rash noted.  Psychiatric: Mood, memory, affect and judgment normal.  Nursing note and vitals reviewed.   LABORATORY DATA:  CBC    Component Value Date/Time   WBC 9.9 10/14/2014 1705   RBC 4.49 10/14/2014 1705   HGB 13.6 10/14/2014 1705   HCT 40.8 10/14/2014 1705   PLT 191.0 10/14/2014 1705   MCV 90.9 10/14/2014 1705   MCH 31.1 06/24/2014 0916   MCHC 33.4 10/14/2014 1705  RDW 13.1 10/14/2014 1705   LYMPHSABS 3.3 10/14/2014 1705   MONOABS 0.6 10/14/2014 1705   EOSABS 0.2 10/14/2014 1705   BASOSABS 0.0 10/14/2014 1705   CMP     Component Value Date/Time   NA 138 10/14/2014 1705   K 4.2 10/14/2014 1705   CL 105 10/14/2014 1705   CO2 23 10/14/2014 1705   GLUCOSE 87 10/14/2014 1705   BUN 12 10/14/2014 1705   CREATININE 0.85 10/14/2014 1705   CALCIUM 9.4 10/14/2014 1705   PROT 7.3 06/24/2014 0916   ALBUMIN 4.0 06/24/2014 0916   AST 33 06/24/2014 0916   ALT 40* 06/24/2014 0916   ALKPHOS 65 06/24/2014 0916   BILITOT 0.3 06/24/2014 0916   GFRNONAA 84* 06/24/2014 0916   GFRAA >90 06/24/2014 0916      RADIOGRAPHIC STUDIES: 11/23/2014 Heidi Lopez is a 69 y.o. for a pelvic sonogram for abnl pap smear results with endometrial cells present. Pt H/O Breast Ca on Tamoxifen. Pt notes no vaginal bleeding although pelvic cramping present. Complex heterogenous cystic  endometrium Normal uterus otherwise Normal ovaries   EURE,LUTHER H 11/23/2014 3:34 PM    ASSESSMENT and THERAPY PLAN:   History of ER+ bilateral breast cancer Intolerance to $RemoveBefore'20mg'[id number]$  Tamoxifen secondary to hot flashes Intolerance to aromasin secondary to hot flashes Abnormal endometrial lining  I have recommended to the patient to discontinue her tamoxifen until she has undergone her D&C. Pending the results of the pathology from her D&C we will make additional recommendations. She notes that Dr. Elonda Husky had mention the possibility of a hysterectomy.  We discussed the benefit of 10 mg of tamoxifen. I advised her that although some think it is beneficial, currently standard of care for the treatment of breast cancer is 20 mg daily.  She is not willing to consider trying Effexor or Neurontin to improve her hot flashes. She is open to trying another aromatase inhibitor. Her 5 years of therapy ends this fall.  We discussed the use of the breast cancer index to tell if she would benefit from prolonged endocrine therapy. We will try to obtain a BCI on her prior malignancies and I advised her this would help [place] make a more informed decision regarding continuing her endocrine therapy beyond 5 years.  I will see her back in a month. We will readdress all of the above issues at follow-up.  She is to continue her calcium and vitamin D.  All questions were answered. The patient knows to call the clinic with any problems, questions or concerns. We can certainly see the patient much sooner if necessary. This note was electronically signed. Molli Hazard MD 12/23/2014

## 2014-12-24 ENCOUNTER — Telehealth (HOSPITAL_COMMUNITY): Payer: Self-pay | Admitting: Emergency Medicine

## 2014-12-24 ENCOUNTER — Encounter (HOSPITAL_COMMUNITY): Payer: Self-pay

## 2014-12-24 ENCOUNTER — Other Ambulatory Visit: Payer: Self-pay

## 2014-12-24 ENCOUNTER — Other Ambulatory Visit (HOSPITAL_COMMUNITY): Payer: Self-pay | Admitting: *Deleted

## 2014-12-24 ENCOUNTER — Encounter (HOSPITAL_COMMUNITY)
Admission: RE | Admit: 2014-12-24 | Discharge: 2014-12-24 | Disposition: A | Discharge status: Home / Self Care | Payer: Medicare Other | Source: Ambulatory Visit | Attending: Obstetrics & Gynecology | Admitting: Obstetrics & Gynecology

## 2014-12-24 DIAGNOSIS — I4891 Unspecified atrial fibrillation: Secondary | ICD-10-CM | POA: Diagnosis not present

## 2014-12-24 DIAGNOSIS — Z01818 Encounter for other preprocedural examination: Secondary | ICD-10-CM | POA: Insufficient documentation

## 2014-12-24 DIAGNOSIS — Z01810 Encounter for preprocedural cardiovascular examination: Secondary | ICD-10-CM | POA: Diagnosis not present

## 2014-12-24 DIAGNOSIS — Z01812 Encounter for preprocedural laboratory examination: Secondary | ICD-10-CM | POA: Diagnosis not present

## 2014-12-24 LAB — URINALYSIS, ROUTINE W REFLEX MICROSCOPIC
Bilirubin Urine: NEGATIVE
Glucose, UA: NEGATIVE mg/dL
Hgb urine dipstick: NEGATIVE
Ketones, ur: NEGATIVE mg/dL
LEUKOCYTES UA: NEGATIVE
NITRITE: NEGATIVE
PH: 6.5 (ref 5.0–8.0)
Protein, ur: NEGATIVE mg/dL
SPECIFIC GRAVITY, URINE: 1.01 (ref 1.005–1.030)
Urobilinogen, UA: 0.2 mg/dL (ref 0.0–1.0)

## 2014-12-24 LAB — COMPREHENSIVE METABOLIC PANEL
ALBUMIN: 4.3 g/dL (ref 3.5–5.2)
ALT: 43 U/L — ABNORMAL HIGH (ref 0–35)
AST: 39 U/L — ABNORMAL HIGH (ref 0–37)
Alkaline Phosphatase: 67 U/L (ref 39–117)
Anion gap: 10 (ref 5–15)
BUN: 11 mg/dL (ref 6–23)
CHLORIDE: 106 mmol/L (ref 96–112)
CO2: 24 mmol/L (ref 19–32)
CREATININE: 0.67 mg/dL (ref 0.50–1.10)
Calcium: 10 mg/dL (ref 8.4–10.5)
GFR calc Af Amer: >90 mL/min (ref >90)
GFR calc non Af Amer: 88 mL/min — ABNORMAL LOW (ref >90)
Glucose, Bld: 86 mg/dL (ref 70–99)
Potassium: 4.2 mmol/L (ref 3.5–5.1)
Sodium: 140 mmol/L (ref 135–145)
Total Bilirubin: 0.4 mg/dL (ref 0.3–1.2)
Total Protein: 7 g/dL (ref 6.0–8.3)

## 2014-12-24 LAB — PROTIME-INR
INR: 1.21 (ref 0.00–1.49)
Prothrombin Time: 15.4 seconds — ABNORMAL HIGH (ref 11.6–15.2)

## 2014-12-24 LAB — CBC
HCT: 40.2 % (ref 36.0–46.0)
Hemoglobin: 13.6 g/dL (ref 12.0–15.0)
MCH: 31 pg (ref 26.0–34.0)
MCHC: 33.8 g/dL (ref 30.0–36.0)
MCV: 91.6 fL (ref 78.0–100.0)
Platelets: 196 10*3/uL (ref 150–400)
RBC: 4.39 MIL/uL (ref 3.87–5.11)
RDW: 12.7 % (ref 11.5–15.5)
WBC: 7.9 10*3/uL (ref 4.0–10.5)

## 2014-12-24 LAB — CEA: CEA: 2.4 ng/mL (ref 0.0–4.7)

## 2014-12-24 LAB — CANCER ANTIGEN 27.29: CA 27.29: 26.2 U/mL (ref 0.0–38.6)

## 2014-12-24 NOTE — Telephone Encounter (Signed)
-----   Message from Baird Cancer, PA-C sent at 12/23/2014 11:50 PM EDT ----- Excellent, some labs are pending.

## 2014-12-24 NOTE — Pre-Procedure Instructions (Signed)
Patient given information to sign up for my chart at home. 

## 2014-12-24 NOTE — Telephone Encounter (Signed)
Notified pt labs that were resulted were good

## 2014-12-24 NOTE — Pre-Procedure Instructions (Signed)
Dr Patsey Berthold aware of EKG of AFib with rate of 100. Knows patient is to be bridged with Lovenox and to stop eliquis on 12/27/2014. No further orders given.

## 2014-12-27 NOTE — Pre-Procedure Instructions (Signed)
Dr Elonda Husky and Dr Patsey Berthold aware of PT/INR. Neither want result repeated.

## 2014-12-29 ENCOUNTER — Ambulatory Visit (HOSPITAL_COMMUNITY): Payer: Medicare Other | Admitting: Anesthesiology

## 2014-12-29 ENCOUNTER — Ambulatory Visit (HOSPITAL_COMMUNITY)
Admission: RE | Admit: 2014-12-29 | Discharge: 2014-12-29 | Disposition: A | Discharge status: Home / Self Care | Payer: Medicare Other | Source: Ambulatory Visit | Attending: Obstetrics & Gynecology | Admitting: Obstetrics & Gynecology

## 2014-12-29 ENCOUNTER — Encounter (HOSPITAL_COMMUNITY): Payer: Self-pay | Admitting: *Deleted

## 2014-12-29 ENCOUNTER — Encounter (HOSPITAL_COMMUNITY)
Admission: RE | Disposition: A | Discharge status: Home / Self Care | Payer: Self-pay | Source: Ambulatory Visit | Attending: Obstetrics & Gynecology

## 2014-12-29 DIAGNOSIS — R87619 Unspecified abnormal cytological findings in specimens from cervix uteri: Secondary | ICD-10-CM | POA: Diagnosis not present

## 2014-12-29 DIAGNOSIS — Z9851 Tubal ligation status: Secondary | ICD-10-CM | POA: Insufficient documentation

## 2014-12-29 DIAGNOSIS — C50912 Malignant neoplasm of unspecified site of left female breast: Secondary | ICD-10-CM | POA: Diagnosis not present

## 2014-12-29 DIAGNOSIS — Z9013 Acquired absence of bilateral breasts and nipples: Secondary | ICD-10-CM | POA: Diagnosis not present

## 2014-12-29 DIAGNOSIS — Z888 Allergy status to other drugs, medicaments and biological substances status: Secondary | ICD-10-CM | POA: Diagnosis not present

## 2014-12-29 DIAGNOSIS — R938 Abnormal findings on diagnostic imaging of other specified body structures: Secondary | ICD-10-CM | POA: Diagnosis not present

## 2014-12-29 DIAGNOSIS — N840 Polyp of corpus uteri: Secondary | ICD-10-CM | POA: Diagnosis not present

## 2014-12-29 DIAGNOSIS — Z853 Personal history of malignant neoplasm of breast: Secondary | ICD-10-CM | POA: Diagnosis not present

## 2014-12-29 DIAGNOSIS — Z87891 Personal history of nicotine dependence: Secondary | ICD-10-CM | POA: Insufficient documentation

## 2014-12-29 DIAGNOSIS — I480 Paroxysmal atrial fibrillation: Secondary | ICD-10-CM | POA: Diagnosis not present

## 2014-12-29 DIAGNOSIS — C50911 Malignant neoplasm of unspecified site of right female breast: Secondary | ICD-10-CM | POA: Diagnosis not present

## 2014-12-29 HISTORY — PX: HYSTEROSCOPY WITH D & C: SHX1775

## 2014-12-29 SURGERY — DILATATION AND CURETTAGE /HYSTEROSCOPY
Anesthesia: General

## 2014-12-29 MED ORDER — PROPOFOL 10 MG/ML IV BOLUS
INTRAVENOUS | Status: DC | PRN
  Administered 2014-12-29: 120 mg via INTRAVENOUS
  Administered 2014-12-29: 50 mg via INTRAVENOUS

## 2014-12-29 MED ORDER — KETOROLAC TROMETHAMINE 30 MG/ML IJ SOLN
INTRAMUSCULAR | Status: AC
  Filled 2014-12-29: qty 1

## 2014-12-29 MED ORDER — ONDANSETRON HCL 8 MG PO TABS: 8.0000 mg | ORAL_TABLET | Freq: Three times a day (TID) | ORAL | Status: DC | PRN

## 2014-12-29 MED ORDER — FENTANYL CITRATE 0.05 MG/ML IJ SOLN
INTRAMUSCULAR | Status: AC
  Filled 2014-12-29: qty 2

## 2014-12-29 MED ORDER — KETOROLAC TROMETHAMINE 10 MG PO TABS: 10.0000 mg | ORAL_TABLET | Freq: Three times a day (TID) | ORAL | Status: DC | PRN

## 2014-12-29 MED ORDER — PROPOFOL 10 MG/ML IV BOLUS
INTRAVENOUS | Status: AC
  Filled 2014-12-29: qty 20

## 2014-12-29 MED ORDER — MIDAZOLAM HCL 2 MG/2ML IJ SOLN
1.0000 mg | INTRAMUSCULAR | Status: DC | PRN
  Administered 2014-12-29: 2 mg via INTRAVENOUS

## 2014-12-29 MED ORDER — FENTANYL CITRATE 0.05 MG/ML IJ SOLN
INTRAMUSCULAR | Status: DC | PRN
  Administered 2014-12-29 (×2): 25 ug via INTRAVENOUS

## 2014-12-29 MED ORDER — LIDOCAINE HCL (PF) 1 % IJ SOLN
INTRAMUSCULAR | Status: AC
  Filled 2014-12-29: qty 5

## 2014-12-29 MED ORDER — ROCURONIUM BROMIDE 50 MG/5ML IV SOLN
INTRAVENOUS | Status: AC
  Filled 2014-12-29: qty 1

## 2014-12-29 MED ORDER — MIDAZOLAM HCL 2 MG/2ML IJ SOLN
INTRAMUSCULAR | Status: AC
  Filled 2014-12-29: qty 2

## 2014-12-29 MED ORDER — FENTANYL CITRATE 0.05 MG/ML IJ SOLN
25.0000 ug | INTRAMUSCULAR | Status: AC
  Administered 2014-12-29 (×2): 25 ug via INTRAVENOUS

## 2014-12-29 MED ORDER — LACTATED RINGERS IV SOLN
INTRAVENOUS | Status: DC
  Administered 2014-12-29: 1000 mL via INTRAVENOUS

## 2014-12-29 MED ORDER — FENTANYL CITRATE 0.05 MG/ML IJ SOLN
INTRAMUSCULAR | Status: AC
  Filled 2014-12-29: qty 5

## 2014-12-29 MED ORDER — LIDOCAINE HCL 1 % IJ SOLN
INTRAMUSCULAR | Status: DC | PRN
  Administered 2014-12-29: 30 mg via INTRADERMAL

## 2014-12-29 MED ORDER — HYDROCODONE-ACETAMINOPHEN 5-325 MG PO TABS: 1.0000 | ORAL_TABLET | Freq: Four times a day (QID) | ORAL | Status: DC | PRN

## 2014-12-29 MED ORDER — KETOROLAC TROMETHAMINE 30 MG/ML IJ SOLN
30.0000 mg | Freq: Once | INTRAMUSCULAR | Status: AC
  Administered 2014-12-29: 30 mg via INTRAVENOUS

## 2014-12-29 MED ORDER — CEFAZOLIN SODIUM-DEXTROSE 2-3 GM-% IV SOLR
INTRAVENOUS | Status: AC
  Filled 2014-12-29: qty 50

## 2014-12-29 MED ORDER — SODIUM CHLORIDE 0.9 % IR SOLN
Status: DC | PRN
  Administered 2014-12-29: 1000 mL

## 2014-12-29 MED ORDER — CEFAZOLIN SODIUM-DEXTROSE 2-3 GM-% IV SOLR
2.0000 g | INTRAVENOUS | Status: AC
  Administered 2014-12-29: 2 g via INTRAVENOUS

## 2014-12-29 MED ORDER — ONDANSETRON HCL 4 MG/2ML IJ SOLN: 4.0000 mg | Freq: Once | INTRAMUSCULAR | Status: DC | PRN

## 2014-12-29 MED ORDER — SUCCINYLCHOLINE CHLORIDE 20 MG/ML IJ SOLN
INTRAMUSCULAR | Status: AC
  Filled 2014-12-29: qty 1

## 2014-12-29 MED ORDER — FENTANYL CITRATE 0.05 MG/ML IJ SOLN: 25.0000 ug | INTRAMUSCULAR | Status: DC | PRN

## 2014-12-29 SURGICAL SUPPLY — 26 items
BAG HAMPER (MISCELLANEOUS) ×3 IMPLANT
CLOTH BEACON ORANGE TIMEOUT ST (SAFETY) ×3 IMPLANT
COVER LIGHT HANDLE STERIS (MISCELLANEOUS) ×6 IMPLANT
FORMALIN 10 PREFIL 120ML (MISCELLANEOUS) ×3 IMPLANT
GAUZE SPONGE 4X4 16PLY XRAY LF (GAUZE/BANDAGES/DRESSINGS) ×3 IMPLANT
GLOVE BIOGEL PI IND STRL 7.0 (GLOVE) IMPLANT
GLOVE BIOGEL PI IND STRL 8 (GLOVE) ×1 IMPLANT
GLOVE BIOGEL PI INDICATOR 7.0 (GLOVE) ×4
GLOVE BIOGEL PI INDICATOR 8 (GLOVE) ×2
GLOVE ECLIPSE 6.5 STRL STRAW (GLOVE) ×2 IMPLANT
GLOVE ECLIPSE 8.0 STRL XLNG CF (GLOVE) ×3 IMPLANT
GOWN STRL REUS W/TWL LRG LVL3 (GOWN DISPOSABLE) ×4 IMPLANT
GOWN STRL REUS W/TWL XL LVL3 (GOWN DISPOSABLE) ×3 IMPLANT
INST SET HYSTEROSCOPY (KITS) ×3 IMPLANT
IV NS 1000ML (IV SOLUTION) ×3
IV NS 1000ML BAXH (IV SOLUTION) ×1 IMPLANT
KIT ROOM TURNOVER AP CYSTO (KITS) ×3 IMPLANT
MANIFOLD NEPTUNE II (INSTRUMENTS) ×3 IMPLANT
PACK BASIC III (CUSTOM PROCEDURE TRAY) ×3
PACK SRG BSC III STRL LF ECLPS (CUSTOM PROCEDURE TRAY) ×1 IMPLANT
PAD ARMBOARD 7.5X6 YLW CONV (MISCELLANEOUS) ×3 IMPLANT
PAD TELFA 3X4 1S STER (GAUZE/BANDAGES/DRESSINGS) ×3 IMPLANT
SET IRRIG Y TYPE TUR BLADDER L (SET/KITS/TRAYS/PACK) ×3 IMPLANT
SHEET LAVH (DRAPES) ×3 IMPLANT
TOWEL OR 17X26 4PK STRL BLUE (TOWEL DISPOSABLE) ×3 IMPLANT
YANKAUER SUCT BULB TIP 10FT TU (MISCELLANEOUS) ×3 IMPLANT

## 2014-12-29 NOTE — H&P (Signed)
Preoperative History and Physical  Heidi Lopez is a 69 y.o. No obstetric history on file. with No LMP recorded. Patient is postmenopausal. admitted for a hysteroscopy uterine curettage due to atypical glandular cells on Pap which led to a sonogram revealing a very thickened and complex endometrium.  On tamoxifen has had breast cancer bilaterally  PMH:  Past Medical History  Diagnosis Date  . Cancer of breast July 2010    s/p XRT 2010 and again in 2011  . Glaucoma   . Cramps, muscle, general     severe breast cramping   . Night sweats   . Pain     breast pain  . Chronic coughing   . Sinus problem   . Wears glasses   . Glaucoma     left eye   . Joint pain     foot and knee pain  . Paroxysmal atrial fibrillation 05/2014  . Dysrhythmia     atrial flutter  . Lymphedema of arm     left    PSH:  Past Surgical History  Procedure Laterality Date  . Tubal ligation  1984  . Colonoscopy N/A 02/05/2013    Procedure: COLONOSCOPY; Surgeon: [name], MD; Location: [facility]; Service: Endoscopy; Laterality: N/A; 830  . Cardioversion N/A 10/21/2014    Procedure: CARDIOVERSION; Surgeon: [name], MD; Location: [facility]; Service: Cardiovascular; Laterality: N/A;  . Breast surgery  2010- right and 2011-left    for cancer    POb/GynH:  OB History    No data available      SH:  History  Substance Use Topics  . Smoking status: Former Smoker -- 1.00 packs/day for 2 years    Types: Cigarettes    Start date: 09/17/1962    Quit date: 11/16/1966  . Smokeless tobacco: Never Used  . Alcohol Use: No    FH:  Family History  Problem Relation Age of Onset  . Heart disease Mother   . Stroke Father   . Cancer Father   . Early death Brother   . Cancer Sister   . Cancer Sister   . Cancer  Sister   . Diabetes Daughter      Allergies:  Allergies  Allergen Reactions  . Metoprolol     Makes her feel bad  . Reglan [Metoclopramide] Other (See Comments)    Causes severe jitters    Medications:  Current outpatient prescriptions:  . apixaban (ELIQUIS) 5 MG TABS tablet, Take 1 tablet (5 mg total) by mouth 2 (two) times daily., Disp: 180 tablet, Rfl: 3 . brimonidine (ALPHAGAN) 0.2 % ophthalmic solution, Place 1 drop into the left eye 2 (two) times daily. , Disp: , Rfl:  . Calcium Carbonate-Vit D-Min (CALTRATE 600+D PLUS PO), Take 1 tablet by mouth daily., Disp: , Rfl:  . Cholecalciferol (VITAMIN D PO), Take 2,000 Units by mouth daily. , Disp: , Rfl:  . Coenzyme Q10 (COQ-10) 200 MG CAPS, Take 1 capsule by mouth daily., Disp: , Rfl:  . Cranberry 500 MG CAPS, Take 500 mg by mouth daily. , Disp: , Rfl:  . diltiazem (CARDIZEM CD) 300 MG 24 hr capsule, Take 1 capsule (300 mg total) by mouth daily., Disp: 90 capsule, Rfl: 3 . fexofenadine (ALLEGRA) 180 MG tablet, Take 180 mg by mouth daily as needed for allergies or rhinitis., Disp: , Rfl:  . LUMIGAN 0.01 % SOLN, Place 1 drop into both eyes at bedtime., Disp: , Rfl:  . Multiple Vitamin (MULTIVITAMIN) tablet, Take  1 tablet by mouth daily. , Disp: , Rfl:  . Omega-3 Fatty Acids (FISH OIL) 1200 MG CAPS, Take 1 capsule by mouth daily., Disp: , Rfl:  . tamoxifen (NOLVADEX) 10 MG tablet, Take 10 mg daily. (Patient taking differently: Take 10 mg by mouth daily.), Disp: 90 tablet, Rfl: 3 . albuterol (PROVENTIL HFA;VENTOLIN HFA) 108 (90 BASE) MCG/ACT inhaler, Inhale into the lungs every 6 (six) hours as needed for wheezing or shortness of breath., Disp: , Rfl:  . enoxaparin (LOVENOX) 80 MG/0.8ML injection, Inject 0.8 mLs (80 mg total) into the skin once. (Patient not taking: Reported on 12/15/2014), Disp: 1 Syringe, Rfl: 0 . furosemide (LASIX) 20 MG tablet, Take 1 tablet (20 mg total) by mouth  daily as needed. Take daily as needed for swelling (Patient not taking: Reported on 12/15/2014), Disp: 90 tablet, Rfl: 3  Review of Systems:   Review of Systems  Constitutional: Negative for fever, chills, weight loss, malaise/fatigue and diaphoresis.  HENT: Negative for hearing loss, ear pain, nosebleeds, congestion, sore throat, neck pain, tinnitus and ear discharge.  Eyes: Negative for blurred vision, double vision, photophobia, pain, discharge and redness.  Respiratory: Negative for cough, hemoptysis, sputum production, shortness of breath, wheezing and stridor.  Cardiovascular: Negative for chest pain, palpitations, orthopnea, claudication, leg swelling and PND.  Gastrointestinal: negative for abdominal pain. Negative for heartburn, nausea, vomiting, diarrhea, constipation, blood in stool and melena.  Genitourinary: Negative for dysuria, urgency, frequency, hematuria and flank pain.  Musculoskeletal: Negative for myalgias, back pain, joint pain and falls.  Skin: Negative for itching and rash.  Neurological: Negative for dizziness, tingling, tremors, sensory change, speech change, focal weakness, seizures, loss of consciousness, weakness and headaches.  Endo/Heme/Allergies: Negative for environmental allergies and polydipsia. Does not bruise/bleed easily.  Psychiatric/Behavioral: Negative for depression, suicidal ideas, hallucinations, memory loss and substance abuse. The patient is not nervous/anxious and does not have insomnia.     PHYSICAL EXAM:  Blood pressure 120/90, pulse 76, weight 194 lb (87.998 kg).   Vitals reviewed. Constitutional: She is oriented to person, place, and time. She appears well-developed and well-nourished.  HENT:  Head: Normocephalic and atraumatic.  Right Ear: External ear normal.  Left Ear: External ear normal.  Nose: Nose normal.  Mouth/Throat: Oropharynx is clear and moist.  Eyes: Conjunctivae and EOM are normal. Pupils are equal, round, and  reactive to light. Right eye exhibits no discharge. Left eye exhibits no discharge. No scleral icterus.  Neck: Normal range of motion. Neck supple. No tracheal deviation present. No thyromegaly present.  Cardiovascular: Normal rate, regular rhythm, normal heart sounds and intact distal pulses. Exam reveals no gallop and no friction rub.  No murmur heard. Respiratory: Effort normal and breath sounds normal. No respiratory distress. She has no wheezes. She has no rales. She exhibits no tenderness.  GI: Soft. Bowel sounds are normal. She exhibits no distension and no mass. There is tenderness. There is no rebound and no guarding.  Genitourinary:   Vulva is normal without lesions Vagina is pink moist without discharge Cervix normal in appearance and pap is normal Uterus is normal Adnexa is negative with normal sized ovaries by sonogram  Musculoskeletal: Normal range of motion. She exhibits no edema and no tenderness.  Neurological: She is alert and oriented to person, place, and time. She has normal reflexes. She displays normal reflexes. No cranial nerve deficit. She exhibits normal muscle tone. Coordination normal.  Skin: Skin is warm and dry. No rash noted. No erythema. No pallor.  Psychiatric:  She has a normal mood and affect. Her behavior is normal. Judgment and thought content normal.    Labs: No results found for this or any previous visit (from the past 336 hour(s)).  EKG: Orders placed or performed in visit on 12/09/14  . EKG 12-Lead    Imaging Studies:  Imaging Results    US Transvaginal Non-ob  11/23/2014 GYNECOLOGIC SONOGRAM CHIFFON KITTLESON is a 69 y.o. for a pelvic sonogram for abnl pap smear results with endometrial cells present. Pt H/O Breast Ca on Tamoxifen. Pt notes no vaginal bleeding although pelvic cramping present. Uterus  9.1 x 6.6 x 5.6 cm, anteverted Endometrium 28.7 mm, symmetrical, cystic thickened with +Doppler flow  noted within Right ovary ?? Unable to positively identify ovarian tissue although no adnexal masses noted Left ovary 2.2 x 1.1 x 0.9 cm, No free fluid or adnexal masses noted within the pelvis Technician Comments: Anteverted uterus, ENDOMETRIUM-28.94mm thickened cystic with +Doppler flow noted within, bilateral adnexa appears WNL, no free fluid or adnexal masses noted within the pelvis Lazarus Gowda 11/23/2014 3:09 PM Clinical Impression and recommendations: I have reviewed the sonogram results above, combined with the patient's current clinical course, below are my impressions and any appropriate recommendations for management based on the sonographic findings. Complex heterogenous cystic endometrium Normal uterus otherwise Normal ovaries Birney Belshe H 11/23/2014 3:34 PM       Assessment: Patient Active Problem List   Diagnosis Date Noted  . Atypical glandular cells of undetermined significance (AGUS) on cervical Pap smear 11/23/2014  . Thickened endometrium 11/23/2014  . Use of tamoxifen (Nolvadex) 11/23/2014  . Snoring 09/20/2014  . A-fib 06/18/2014  . Pain in joint, ankle and foot 10/16/2011  . History of breast cancer, bilateral: Right T1N0 Lumpectomy July 2010, Left T1c, N0 Lumpectomy 04/26/2010. 05/04/2011    Plan: Hysteroscopy uterine curettage      Arynn Armand H 12/29/2014 8:26 AM

## 2014-12-29 NOTE — Anesthesia Procedure Notes (Signed)
Procedure Name: LMA Insertion Performed by: Ollen Bowl Pre-anesthesia Checklist: Patient identified, Patient being monitored, Emergency Drugs available, Timeout performed and Suction available Patient Re-evaluated:Patient Re-evaluated prior to inductionOxygen Delivery Method: Circle System Utilized Preoxygenation: Pre-oxygenation with 100% oxygen Intubation Type: IV induction Ventilation: Mask ventilation without difficulty LMA: LMA inserted LMA Size: 4.0 Number of attempts: 1 Placement Confirmation: positive ETCO2 and breath sounds checked- equal and bilateral Comments: LMA #3 attempted, but not adequate seal.  LMA #4 placed with good results.

## 2014-12-29 NOTE — Addendum Note (Signed)
Addendum  created 12/29/14 1016 by Ollen Bowl, CRNA   Modules edited: Anesthesia Attestations

## 2014-12-29 NOTE — Discharge Instructions (Signed)
Hysteroscopy, Care After °Refer to this sheet in the next few weeks. These instructions provide you with information on caring for yourself after your procedure. Your health care provider may also give you more specific instructions. Your treatment has been planned according to current medical practices, but problems sometimes occur. Call your health care provider if you have any problems or questions after your procedure.  °WHAT TO EXPECT AFTER THE PROCEDURE °After your procedure, it is typical to have the following: °· You may have some cramping. This normally lasts for a couple days. °· You may have bleeding. This can vary from light spotting for a few days to menstrual-like bleeding for 3-7 days. °HOME CARE INSTRUCTIONS °· Rest for the first 1-2 days after the procedure. °· Only take over-the-counter or prescription medicines as directed by your health care provider. Do not take aspirin. It can increase the chances of bleeding. °· Take showers instead of baths for 2 weeks or as directed by your health care provider. °· Do not drive for 24 hours or as directed. °· Do not drink alcohol while taking pain medicine. °· Do not use tampons, douche, or have sexual intercourse for 2 weeks or until your health care provider says it is okay. °· Take your temperature twice a day for 4-5 days. Write it down each time. °· Follow your health care provider's advice about diet, exercise, and lifting. °· If you develop constipation, you may: °¨ Take a mild laxative if your health care provider approves. °¨ Add bran foods to your diet. °¨ Drink enough fluids to keep your urine clear or pale yellow. °· Try to have someone with you or available to you for the first 24-48 hours, especially if you were given a general anesthetic. °· Follow up with your health care provider as directed. °SEEK MEDICAL CARE IF: °· You feel dizzy or lightheaded. °· You feel sick to your stomach (nauseous). °· You have abnormal vaginal discharge. °· You  have a rash. °· You have pain that is not controlled with medicine. °SEEK IMMEDIATE MEDICAL CARE IF: °· You have bleeding that is heavier than a normal menstrual period. °· You have a fever. °· You have increasing cramps or pain, not controlled with medicine. °· You have new belly (abdominal) pain. °· You pass out. °· You have pain in the tops of your shoulders (shoulder strap areas). °· You have shortness of breath. °Document Released: 06/24/2013 Document Reviewed: 06/24/2013 °[employer]® Patient Information ©2015 [employer]. This information is not intended to replace advice given to you by your health care provider. Make sure you discuss any questions you have with your health care provider. ° °

## 2014-12-29 NOTE — Anesthesia Preprocedure Evaluation (Signed)
Anesthesia Evaluation  Patient identified by MRN, date of birth, ID band  Reviewed: Allergy & Precautions, NPO status , Patient's Chart, lab work & pertinent test results  Airway Mallampati: II  TM Distance: >3 FB     Dental  (+) Teeth Intact   Pulmonary former smoker,  breath sounds clear to auscultation        Cardiovascular + dysrhythmias Atrial Fibrillation Rhythm:Irregular Rate:Normal     Neuro/Psych    GI/Hepatic   Endo/Other    Renal/GU      Musculoskeletal   Abdominal   Peds  Hematology   Anesthesia Other Findings   Reproductive/Obstetrics                             Anesthesia Physical Anesthesia Plan  ASA: III  Anesthesia Plan: General   Post-op Pain Management:    Induction: Intravenous  Airway Management Planned: LMA  Additional Equipment:   Intra-op Plan:   Post-operative Plan: Extubation in OR  Informed Consent: I have reviewed the patients History and Physical, chart, labs and discussed the procedure including the risks, benefits and alternatives for the proposed anesthesia with the patient or authorized representative who has indicated his/her understanding and acceptance.     Plan Discussed with:   Anesthesia Plan Comments:         Anesthesia Quick Evaluation

## 2014-12-29 NOTE — Op Note (Signed)
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 She was not having any problems or complaints. Surprisingly her cytology returned as atypical glandular cells favor endometrial. I then got a sonogram in the office which revealed a very complex thickened endometrium consistent with change from chronic tamoxifen therapy.  It is noted the patient has had bilateral breast cancer in the past. Today there was an obvious polyp present and a large amount of endometrial proliferation however it did not strike me necessarily as malignant.  There were no other endometrial abnormalities noted  Description of operation: The patient was taken to the operating room and placed in the supine position where she underwent laryngeal mask airway anesthesia. She was then placed in low lithotomy position. She was prepped and draped in the usual fashion for a vaginal procedure. A Graves speculum was placed. The cervix was grasped with a single-tooth tenaculum. The cervix was dilated serially using Hegar dilators to allow passage of the hysteroscope. The above-noted findings were seen specifically the endometrial polyp and the lush endometrial proliferation. A vigorous uterine curettage was performed. Good uterine crie was obtained in all areas. A repeat hysteroscopy was performed and the endometrium was then and the polyp had been removed. There was no significant bleeding from the endometrium.(The patient is chronically anticoagulated due  to chronic atrial fibrillation and had been transitioned to Lovenox over the past 48 hours in anticipation of the surgery) The single-tooth tenaculum was removed and there was no bleeding from the puncture sites.  The patient was awakened from anesthesia and taken to the recovery room in good stable condition. All counts were correct 3. She did receive Ancef 2 g and Toradol 30 mg IV perioperatively.  Estimated blood loss was minimal  She will be seen back in the office next week to go over her pathology report.  She understands that there is a good chance it is endometrial hyperplasia at the minimum and indeed could be an endometrial adenocarcinoma.  [name], MD 12/29/2014 9:17 AM

## 2014-12-29 NOTE — Transfer of Care (Signed)
Immediate Anesthesia Transfer of Care Note  Patient: Heidi Lopez  Procedure(s) Performed: Procedure(s): DILATATION AND CURETTAGE /HYSTEROSCOPY (N/A)  Patient Location: PACU  Anesthesia Type:General  Level of Consciousness: awake, alert  and oriented  Airway & Oxygen Therapy: Patient Spontanous Breathing  Post-op Assessment: Report given to RN  Post vital signs: Reviewed  Last Vitals:  Filed Vitals:   12/29/14 0815  BP: 133/77  Pulse:   Temp:   Resp: 18    Complications: No apparent anesthesia complications

## 2014-12-29 NOTE — Anesthesia Postprocedure Evaluation (Signed)
  Anesthesia Post-op Note  Patient: Heidi Lopez  Procedure(s) Performed: Procedure(s): DILATATION AND CURETTAGE /HYSTEROSCOPY (N/A)  Patient Location: PACU  Anesthesia Type:General  Level of Consciousness: awake and alert   Airway and Oxygen Therapy: Patient Spontanous Breathing  Post-op Pain: none  Post-op Assessment: Post-op Vital signs reviewed, Patient's Cardiovascular Status Stable, Respiratory Function Stable, Patent Airway and No signs of Nausea or vomiting  Post-op Vital Signs: Reviewed and stable  Last Vitals:  Filed Vitals:   12/29/14 0925  BP: 121/43  Pulse: 79  Temp:   Resp: 14    Complications: No apparent anesthesia complications

## 2014-12-30 ENCOUNTER — Encounter (HOSPITAL_COMMUNITY): Payer: Self-pay | Admitting: Obstetrics & Gynecology

## 2015-01-03 ENCOUNTER — Encounter (HOSPITAL_COMMUNITY): Payer: Self-pay

## 2015-01-04 ENCOUNTER — Encounter (HOSPITAL_COMMUNITY): Payer: Self-pay

## 2015-01-05 ENCOUNTER — Ambulatory Visit (INDEPENDENT_AMBULATORY_CARE_PROVIDER_SITE_OTHER): Payer: Medicare Other | Admitting: Obstetrics & Gynecology

## 2015-01-05 ENCOUNTER — Encounter: Payer: Self-pay | Admitting: Obstetrics & Gynecology

## 2015-01-05 VITALS — BP 128/90 | HR 76 | Wt 193.0 lb

## 2015-01-05 DIAGNOSIS — Z9889 Other specified postprocedural states: Secondary | ICD-10-CM | POA: Diagnosis not present

## 2015-01-05 NOTE — Progress Notes (Signed)
Patient ID: Heidi Lopez, female   DOB: February 28, 1946, 69 y.o.   MRN: [id number]  HPI: Patient returns for routine postoperative follow-up having undergone hysteroscopy uterine curettage for thickened endometrium after AGUS on 12/29/2014.  The patient's immediate postoperative recovery has been unremarkable. Since hospital discharge the patient reports no problems.   Current Outpatient Prescriptions: apixaban (ELIQUIS) 5 MG TABS tablet, Take 1 tablet (5 mg total) by mouth 2 (two) times daily., Disp: 180 tablet, Rfl: 3 brimonidine (ALPHAGAN) 0.2 % ophthalmic solution, Place 1 drop into the left eye 2 (two) times daily. , Disp: , Rfl:  Calcium Carbonate-Vit D-Min (CALTRATE 600+D PLUS PO), Take 1 tablet by mouth daily., Disp: , Rfl:  Cholecalciferol (VITAMIN D PO), Take 2,000 Units by mouth daily. , Disp: , Rfl:  Coenzyme Q10 (COQ-10) 200 MG CAPS, Take 1 capsule by mouth daily., Disp: , Rfl:  Cranberry 500 MG CAPS, Take 500 mg by mouth daily.  , Disp: , Rfl:  diltiazem (CARDIZEM CD) 300 MG 24 hr capsule, Take 1 capsule (300 mg total) by mouth daily., Disp: 90 capsule, Rfl: 3 fexofenadine (ALLEGRA) 180 MG tablet, Take 180 mg by mouth daily as needed for allergies or rhinitis., Disp: , Rfl:  furosemide (LASIX) 20 MG tablet, Take 1 tablet (20 mg total) by mouth daily as needed. Take daily as needed for swelling, Disp: 90 tablet, Rfl: 3 LUMIGAN 0.01 % SOLN, Place 1 drop into both eyes at bedtime., Disp: , Rfl:  Multiple Vitamin (MULTIVITAMIN) tablet, Take 1 tablet by mouth daily. , Disp: , Rfl:  Omega-3 Fatty Acids (FISH OIL) 1200 MG CAPS, Take 1 capsule by mouth daily., Disp: , Rfl:  albuterol (PROVENTIL HFA;VENTOLIN HFA) 108 (90 BASE) MCG/ACT inhaler, Inhale into the lungs every 6 (six) hours as needed for wheezing or shortness of breath., Disp: , Rfl:  HYDROcodone-acetaminophen (NORCO/VICODIN) 5-325 MG per tablet, Take 1 tablet by mouth every 6 (six) hours as needed. (Patient not taking: Reported on  01/05/2015), Disp: 30 tablet, Rfl: 0 ketorolac (TORADOL) 10 MG tablet, Take 1 tablet (10 mg total) by mouth every 8 (eight) hours as needed. (Patient not taking: Reported on 01/05/2015), Disp: 15 tablet, Rfl: 0 ondansetron (ZOFRAN) 8 MG tablet, Take 1 tablet (8 mg total) by mouth every 8 (eight) hours as needed for nausea. (Patient not taking: Reported on 01/05/2015), Disp: 12 tablet, Rfl: 0 tamoxifen (NOLVADEX) 10 MG tablet, Take 10 mg daily. (Patient not taking: Reported on 12/24/2014), Disp: 90 tablet, Rfl: 3  No current facility-administered medications for this visit.    Blood pressure 128/90, pulse 76, weight 193 lb (87.544 kg).  Physical Exam: Normal post op evaluation  Diagnostic Tests:   Pathology: Benign pathology no hyperplasia or malignancy or atypia  Impression: S/P formal uterine curettage  Plan: folow up yearly + Pap 1 year  Follow up: 1  years  [name], MD

## 2015-01-06 ENCOUNTER — Encounter (HOSPITAL_COMMUNITY): Payer: Self-pay | Admitting: Physical Therapy

## 2015-01-06 ENCOUNTER — Ambulatory Visit (HOSPITAL_COMMUNITY): Payer: Medicare Other | Attending: Hematology & Oncology | Admitting: Physical Therapy

## 2015-01-06 DIAGNOSIS — R29898 Other symptoms and signs involving the musculoskeletal system: Secondary | ICD-10-CM

## 2015-01-06 DIAGNOSIS — Z853 Personal history of malignant neoplasm of breast: Secondary | ICD-10-CM

## 2015-01-06 DIAGNOSIS — R6889 Other general symptoms and signs: Secondary | ICD-10-CM | POA: Diagnosis not present

## 2015-01-06 DIAGNOSIS — I890 Lymphedema, not elsewhere classified: Secondary | ICD-10-CM | POA: Diagnosis present

## 2015-01-06 NOTE — Therapy (Signed)
[facility] [place], [place], [address] Phone: [phone]   Fax:  [phone]  Physical Therapy Evaluation  Patient Details  Name: Heidi Lopez MRN: [id number] Date of Birth: Apr 01, 1946 Referring Provider:  Patrici Ranks, MD  Encounter Date: 01/06/2015      PT End of Session - 01/06/15 1012    Visit Number 1   Number of Visits 16   Date for PT Re-Evaluation 02/03/15   Authorization Type Medicare    Authorization Time Period 01/06/15 to 03/08/15   Authorization - Visit Number 1   Authorization - Number of Visits 10   Activity Tolerance Patient tolerated treatment well   Behavior During Therapy [facility] for tasks assessed/performed      Past Medical History  Diagnosis Date  . Cancer of breast July 2010    s/p XRT 2010 and again in 2011  . Glaucoma   . Cramps, muscle, general     severe breast cramping   . Night sweats   . Pain     breast pain  . Chronic coughing   . Sinus problem   . Wears glasses   . Glaucoma     left eye   . Joint pain     foot and knee pain  . Paroxysmal atrial fibrillation 05/2014  . Dysrhythmia     atrial flutter  . Lymphedema of arm     left    Past Surgical History  Procedure Laterality Date  . Tubal ligation  1984  . Colonoscopy N/A 02/05/2013    Procedure: COLONOSCOPY;  Surgeon: [name], MD;  Location: [facility] ENDO SU[facility];  Service: Endoscopy;  Laterality: N/A;  830  . Cardioversion N/A 10/21/2014    Procedure: CARDIOVERSION;  Surgeon: [name], MD;  Location: [facility] ENDOSCOPY;  Service: Cardiovascular;  Laterality: N/A;  . Breast surgery  2010- right and 2011-left    for cancer  . Hysteroscopy w/d&c N/A 12/29/2014    Procedure: DILATATION AND CURETTAGE /HYSTEROSCOPY;  Surgeon: [name], MD;  Location: [facility] ORS;  Service: Gynecology;  Laterality: N/A;    There were no vitals filed for this visit.  Visit Diagnosis:  History of breast cancer in female - Plan: PT plan  of care cert/re-cert  Weakness of shoulder - Plan: PT plan of care cert/re-cert  Lymphedema - Plan: PT plan of care cert/re-cert  Decreased functional activity tolerance - Plan: PT plan of care cert/re-cert      Subjective Assessment - 01/06/15 0904    Subjective Usually feels pretty good except for when she is having problems with A-fib, when she starts to feel tired fairly easily. Is trying to go back to walking in malls. Is starting to notice that her clothes are fitting differently between arms.    Pertinent History Patient has had cancer in both breasts with most recent being 2011; currently is dealing with lympedema in L arm after cancer in 2011. Did lumpectomies for both breasts. Took radiation on both sides and had been taking Tamoxifen; has been off of it recently due to concern with uterus thickening but may go back on it soon.    Patient Stated Goals main concern is the swelling in her arm   Currently in Pain? No/denies  not right now however some bras cause tenderness under the L arm where lymph nodes were taken out            [facility] PT Assessment - 01/06/15 0001  Assessment   Medical Diagnosis s/p history of breast cancer    Onset Date 01/05/10  approximate   Next MD Visit 9th of May    Prior Therapy has had therapy on neck and shoulders    Precautions   Precautions None   Restrictions   Weight Bearing Restrictions No   Balance Screen   Has the patient fallen in the past 6 months No   Has the patient had a decrease in activity level because of a fear of falling?  Yes   Is the patient reluctant to leave their home because of a fear of falling?  No   Prior Function   Level of Independence Independent with basic ADLs;Independent with gait;Independent with transfers   Vocation Retired   Leisure very active in community and with frriends and family; sewing, reading   Observation/Other Assessments   Observations FACIT-F 61  Fatigue VAS 0,3,1; Pain VAS 0,3,1;  Distress VAS 0, 1,0   Skin Integrity Girth measures: R at wrist 16cm, R distal humerus 26cm, R mid-humerus 31cm, R proximal humerus 31.5cm. L wrist 16.5cm, L distal humerus 28cm, L mid-humerus 32cm, L proximal humerus 35cm   Posture/Postural Control   Posture Comments forward head, bilateral shoulder IR, flat thoracic spine   AROM   Right Shoulder Flexion 180 Degrees   Right Shoulder ABduction 180 Degrees   Left Shoulder Flexion 175 Degrees   Left Shoulder ABduction 178 Degrees   Cervical Flexion 74   Cervical Extension 41   Cervical - Right Side Bend 60   Cervical - Left Side Bend 58   Cervical - Right Rotation 70   Cervical - Left Rotation 80   Strength   Right Shoulder Flexion 4+/5   Right Shoulder ABduction 4+/5   Right Shoulder Internal Rotation 4+/5   Right Shoulder External Rotation 4+/5   Left Shoulder Flexion 4/5   Left Shoulder ABduction 4/5   Left Shoulder Internal Rotation 5/5   Left Shoulder External Rotation 4/5   Ambulation/Gait   Gait Comments very rigid in thoracic spine during gait, reduced shoulder swing of bilateral arms                            PT Education - 01/06/15 0957    Education provided Yes   Education Details Prognosis, PT plan of care, educated regarding positioning during sleep and about elevating arm during day to assist in reducing fluid    Person(s) Educated Patient   Methods Explanation;Handout   Comprehension Verbalized understanding;Returned demonstration          PT Short Term Goals - 01/06/15 1013    PT SHORT TERM GOAL #1   Title Patient will improve overall upper extremity strength to at least 4+/5 bilaterally    Time 4   Period Weeks   Status New   PT SHORT TERM GOAL #2   Title Patient will be able to verbally state the importance of good postural awareness and will be able to maintain good posture on a consistent basis    Time 4   Period Weeks   Status New   PT SHORT TERM GOAL #3   Title Patient will  be able to verbalize the importance of and will consistently adhere to positioning techniques when at seated rest and when asleep to assist in managing lymphedema L arm    Time 4   Period Weeks   Status New   PT SHORT TERM GOAL #  4   Title Patient will be independent in correctly and consistently performing appropriate HEP and appropriate lymphedema home management techniques    Time 4   Period Weeks   Status New           PT Long Term Goals - Feb 01, 2015 1014    PT LONG TERM GOAL #1   Title Patient will demonstrate 5/5 strength in bilateral upper extremities    Time 8   Period Weeks   Status New   PT LONG TERM GOAL #2   Title Patient will demonstrate a decrease in girth measures L upper extremity by at least 50% and will consistently maintain this decrease in the 2-3 weeks before discharge    Time 8   Period Weeks   Status New   PT LONG TERM GOAL #3   Title Patient will be independent in home management of lymphedema L arm, including positioning strategies and in activity parameters    Time 8   Period Weeks   Status New   PT LONG TERM GOAL #4   Title Patient to report increased activity tolerance as evidenced by consistent ability to ambulate at least 2 miles a day with minimal fatigue and no A-fib symptoms    Time 8   Period Weeks   Status New               Plan - Feb 01, 2015 1012    Clinical Impression Statement Patient presents with primary complaint of lymphedema in L arm after treatments for breast cancer, also some weakness in bilateral upper extremities, postural impairments, and noted stiffness in thoracic spine. Patient states that she is very active but has recently been limited in her activity by A-Fib, which is being managed and monitored by her MD; she states that she continues to be as active as she can. Most recent episode of breast cancer in 2011. Patient will benefit from skilled PT services to address these impairments as well as to assist her in reaching an  optimal level of function. Patient was provided with STAR program guidebook and pedometer at end of session.     Pt will benefit from skilled therapeutic intervention in order to improve on the following deficits Cardiopulmonary status limiting activity;Increased edema;Postural dysfunction;Decreased activity tolerance;Impaired UE functional use;Decreased strength;Other (comment)  lymphedema L upper extremity    Rehab Potential Good   PT Frequency 2x / week   PT Duration 8 weeks   PT Treatment/Interventions ADLs/Self Care Home Management;Compression bandaging;Patient/family education;Cryotherapy;Therapeutic activities;Therapeutic exercise;Manual techniques;Balance training;Manual lymph drainage   PT Next Visit Plan Do TUG; manual lymphatic drainage and exercises/activities as appropriate    PT Home Exercise Plan scapular retractions, posterior shoulder rolls, wall pushups, supine punches    Consulted and Agree with Plan of Care Patient          G-Codes - Feb 01, 2015 1011    Functional Assessment Tool Used Skilled clinical assessment based on girth measures, strength, reported functional activity tolerance    Functional Limitation Other PT primary   Other PT Primary Current Status (T2458) At least 20 percent but less than 40 percent impaired, limited or restricted   Other PT Primary Goal Status (K9983) At least 1 percent but less than 20 percent impaired, limited or restricted       Problem List Patient Active Problem List   Diagnosis Date Noted  . Atypical glandular cells of undetermined significance (AGUS) on cervical Pap smear 11/23/2014  . Thickened endometrium 11/23/2014  . Use of tamoxifen (Nolvadex)  11/23/2014  . Snoring 09/20/2014  . A-fib 06/18/2014  . Pain in joint, ankle and foot 10/16/2011  . History of breast cancer, bilateral: Right T1N0 Lumpectomy July 2010, Left T1c, N0 Lumpectomy 04/26/2010. 05/04/2011    Deniece Ree PT, DPT [facility] [address] [place], [place], [address] Phone: [phone]   Fax:  [phone]

## 2015-01-06 NOTE — Patient Instructions (Signed)
Scapular Retraction (Standing)   With arms at sides, pinch shoulder blades together. Repeat _15___ times per set. Do __1__ sets per session. Do __1-2__ sessions per day.  http://orth.exer.us/945   Copyright  VHI. All rights reserved.   BACKWARDS SHOULDER ROLLS  -seated upright in a chair with good posture, gently roll shoulders backwards at a slow pace and through full range of motion 15 times, 1-2 times per day   All Fours Push-Up Shoulder Punch   Lying on your back, put your arm up facing the ceiling and, keeping your elbow straight, reach up and try to punch the ceiling. Repeat 10 times, 1-2 times.   Closed Chain: Wall Push-Off   Stand __1_ foott from wall. Put your arms up on the wall and lower yourself down towards the wall in a push-up.  Immediately push back to start. Repeat _10__ times per set.  Do _1__ sets per session.  http://plyo.exer.us/176   Copyright  VHI. All rights reserved.

## 2015-01-10 ENCOUNTER — Encounter (HOSPITAL_COMMUNITY): Payer: Medicare Other

## 2015-01-12 ENCOUNTER — Ambulatory Visit (HOSPITAL_COMMUNITY): Payer: Medicare Other | Admitting: Physical Therapy

## 2015-01-12 ENCOUNTER — Encounter (HOSPITAL_COMMUNITY): Payer: Self-pay | Admitting: Nurse Practitioner

## 2015-01-12 ENCOUNTER — Other Ambulatory Visit (HOSPITAL_COMMUNITY): Payer: Self-pay | Admitting: *Deleted

## 2015-01-12 ENCOUNTER — Ambulatory Visit (HOSPITAL_COMMUNITY)
Admission: RE | Admit: 2015-01-12 | Discharge: 2015-01-12 | Disposition: A | Discharge status: Home / Self Care | Payer: Medicare Other | Source: Ambulatory Visit | Attending: Nurse Practitioner | Admitting: Nurse Practitioner

## 2015-01-12 VITALS — BP 120/78 | HR 137 | Ht 63.0 in | Wt 194.0 lb

## 2015-01-12 DIAGNOSIS — I480 Paroxysmal atrial fibrillation: Secondary | ICD-10-CM | POA: Diagnosis present

## 2015-01-12 DIAGNOSIS — R6889 Other general symptoms and signs: Secondary | ICD-10-CM

## 2015-01-12 DIAGNOSIS — Z853 Personal history of malignant neoplasm of breast: Secondary | ICD-10-CM

## 2015-01-12 DIAGNOSIS — R29898 Other symptoms and signs involving the musculoskeletal system: Secondary | ICD-10-CM

## 2015-01-12 DIAGNOSIS — I4892 Unspecified atrial flutter: Secondary | ICD-10-CM | POA: Diagnosis not present

## 2015-01-12 DIAGNOSIS — Z87891 Personal history of nicotine dependence: Secondary | ICD-10-CM | POA: Diagnosis not present

## 2015-01-12 DIAGNOSIS — R0683 Snoring: Secondary | ICD-10-CM | POA: Diagnosis not present

## 2015-01-12 DIAGNOSIS — I890 Lymphedema, not elsewhere classified: Secondary | ICD-10-CM

## 2015-01-12 NOTE — Patient Instructions (Signed)
Dr. [name] scheduler will call you regarding appointment to set up ablation

## 2015-01-12 NOTE — Therapy (Addendum)
[place], [place], [address] Phone: [phone]   Fax:  [phone]  Physical Therapy Treatment  Patient Details  Name: Heidi Lopez MRN: [id number] Date of Birth: 20-Jul-1946 Referring Provider:  Patrici Ranks, MD  Encounter Date: 01/12/2015      PT End of Session - 01/12/15 1208    Visit Number 2   Number of Visits 16   Date for PT Re-Evaluation 02/03/15   Authorization Type Medicare    Authorization Time Period 01/06/15 to 03/08/15   Authorization - Visit Number 2   Authorization - Number of Visits 10   PT Start Time 1112   PT Stop Time 1205   PT Time Calculation (min) 53 min   Activity Tolerance Patient tolerated treatment well   Behavior During Therapy [facility] for tasks assessed/performed      Past Medical History  Diagnosis Date  . Cancer of breast July 2010    s/p XRT 2010 and again in 2011  . Glaucoma   . Cramps, muscle, general     severe breast cramping   . Night sweats   . Pain     breast pain  . Chronic coughing   . Sinus problem   . Wears glasses   . Glaucoma     left eye   . Joint pain     foot and knee pain  . Paroxysmal atrial fibrillation 05/2014  . Dysrhythmia     atrial flutter  . Lymphedema of arm     left    Past Surgical History  Procedure Laterality Date  . Tubal ligation  1984  . Colonoscopy N/A 02/05/2013    Procedure: COLONOSCOPY;  Surgeon: [name], MD;  [facility];  Service: Endoscopy;  Laterality: N/A;  830  . Cardioversion N/A 10/21/2014    Procedure: CARDIOVERSION;  Surgeon: [name], MD;  Location: [facility] ENDOSCOPY;  Service: Cardiovascular;  Laterality: N/A;  . Breast surgery  2010- right and 2011-left    for cancer  . Hysteroscopy w/d&c N/A 12/29/2014    Procedure: DILATATION AND CURETTAGE /HYSTEROSCOPY;  Surgeon: [name], MD;  Location: [facility] ORS;  Service: Gynecology;  Laterality: N/A;    There were no vitals filed for this  visit.  Visit Diagnosis:  History of breast cancer in female  Weakness of shoulder  Lymphedema  Decreased functional activity tolerance      Subjective Assessment - 01/12/15 1206    Subjective Pt states her Lt UE gets heavy and full feeling at times.  Statess she's noticed her shirt arms are a little tighter too.  Reports compliance with HEP given at last visit.            [facility] PT Assessment - 01/12/15 0001    Functional Tests   Functional tests Other   Other:   Other/ Comments TUG 7.25 seconds                     [facility] Adult PT Treatment/Exercise - 01/12/15 0001    Manual Therapy   Manual Therapy Manual Lymphatic Drainage (MLD)   Manual Lymphatic Drainage (MLD) for Lt UE routing fluid to Lt inguinal nodes and also to deep and superficial abdominal, clavical and lumbar nodes.           Date 01/12/2015    Right *left  MCP 18.80 19.00  WRIST 16 16  4cm 17.00 16.80  8cm 19.50 20.40  12 cm 23.40 23.30  16cm 24.50 25.00  20cm 25.00 26.00  24cm 25.00 28.00  28cm 27.40 30.30  32cm 28.80 32.00  36cm 32.60 34.00  40cm 32.60 35.50  44cm 35.00 36.40                          Sum of squares 8607.22 9626.59  Total Volume 7001.749 3064.24           PT Education - 01/12/15 1207    Education provided Yes   Education Details Patient given copy of initial evalutation and goals explained.  discussed diagnosis and treatment process of lymphedema.    Person(s) Educated Patient   Methods Explanation;Handout   Comprehension Verbalized understanding          PT Short Term Goals - 01/06/15 1013    PT SHORT TERM GOAL #1   Title Patient will improve overall upper extremity strength to at least 4+/5 bilaterally    Time 4   Period Weeks   Status New   PT SHORT TERM GOAL #2   Title Patient will be able to verbally state the importance of good postural awareness and will be able to maintain good posture on a consistent basis    Time 4   Period Weeks    Status New   PT SHORT TERM GOAL #3   Title Patient will be able to verbalize the importance of and will consistently adhere to positioning techniques when at seated rest and when asleep to assist in managing lymphedema L arm    Time 4   Period Weeks   Status New   PT SHORT TERM GOAL #4   Title Patient will be independent in correctly and consistently performing appropriate HEP and appropriate lymphedema home management techniques    Time 4   Period Weeks   Status New           PT Long Term Goals - 01/06/15 1014    PT LONG TERM GOAL #1   Title Patient will demonstrate 5/5 strength in bilateral upper extremities    Time 8   Period Weeks   Status New   PT LONG TERM GOAL #2   Title Patient will demonstrate a decrease in girth measures L upper extremity by at least 50% and will consistently maintain this decrease in the 2-3 weeks before discharge    Time 8   Period Weeks   Status New   PT LONG TERM GOAL #3   Title Patient will be independent in home management of lymphedema L arm, including positioning strategies and in activity parameters    Time 8   Period Weeks   Status New   PT LONG TERM GOAL #4   Title Patient to report increased activity tolerance as evidenced by consistent ability to ambulate at least 2 miles a day with minimal fatigue and no A-fib symptoms    Time 8   Period Weeks   Status New               Plan - 01/12/15 1209    Clinical Impression Statement Volumetric measurements obtained of bilateral UE's.  Lt UE with 324.48cc more fluid as compared to Rt UE and is mostly obtained in proximal UE. Explained to patient the need for a compression garment and the process of manual lymph drainage.  TUG test completed at 7.25" and recorded into assessment section of medical record.  Manual lymph drainage completed for LT UE with  good response.    Pt will benefit from skilled therapeutic intervention in order to improve on the following deficits --  lymphedema L  upper extremity    PT Next Visit Plan Continue manual lymphatic drainage for Lt UE and exercises/activities as appropriate.  Measure weekly and refer for fitting when appropriate.      Consulted and Agree with Plan of Care Patient        Problem List Patient Active Problem List   Diagnosis Date Noted  . Atypical glandular cells of undetermined significance (AGUS) on cervical Pap smear 11/23/2014  . Thickened endometrium 11/23/2014  . Use of tamoxifen (Nolvadex) 11/23/2014  . Snoring 09/20/2014  . A-fib 06/18/2014  . Pain in joint, ankle and foot 10/16/2011  . History of breast cancer, bilateral: Right T1N0 Lumpectomy July 2010, Left T1c, N0 Lumpectomy 04/26/2010. 05/04/2011    Teena Irani, PTA/CLT [phone]  01/12/2015, 12:27 PM  [place] [address] [place], [place], [address] Phone: [phone]   Fax:  [phone]

## 2015-01-12 NOTE — Progress Notes (Signed)
Patient ID: Heidi Lopez, female   DOB: February 18, 1946, 69 y.o.   MRN: [id number]      Primary Care Physician: [name], MD Referring Physician:  Dr Harl Bowie GYN: Dr. Darlis Loan   Heidi Lopez is a 69 y.o. female with  H/o paroxysmal  atrial fibrillation who presents for follow up in the afib clinic.  She has a h/o breast CA and underwent XRT in 2010 and again in 2011.  She reports that her symptoms of AF began in 2011 after her XRT. Marland Kitchen  She was  placed on diltiazem.  Dose titration has been limited by edema.  She was on digoxin and  This was discontinued. Her chads2vasc score is at least 2.  She is anticoagulated with eliquis.  She is tolerating this without difficulty.  She is unaware of triggers or precipitants for her afib.    When seen by Dr. [name] 1/4 she was started on Flecainide 50 mg bid.  Flecainide was increased to 100 mg bid  because she has not seen any improvement in afib symptoms and actually thought episodes may be worse. Peak flecainide level on 100 mg bid was in range at 0.51. She was then set up for DCCV 10/21/14. Held in rhythm x one week then fast heart rate resumed. Recently also had abnormal pap smear and was scheduled to have a D&C but pre op EKG showed rapid flutter. Surgery was cancelled.  EKG's have varied showing afib/ aflutter, flecainide discontinued due to failing to keep pt in rhythm.Tolerates rapid rate well. Was  able to have surgery with increase in Cardizem for rate control. Discussed amiodarone short term but pt did not start due to fear of side effects. Bx was benign, was in afib during surgery but rate controlled.  Today, she denies symptoms of  chest pain,   orthopnea, PND, lower extremity edema, dizziness, presyncope, syncope, or neurologic sequela.The patient is tolerating medications without difficulties and is otherwise without complaint today.   Past Medical History  Diagnosis Date  . Cancer of breast July 2010    s/p XRT 2010 and again in 2011  . Glaucoma    . Cramps, muscle, general     severe breast cramping   . Night sweats   . Pain     breast pain  . Chronic coughing   . Sinus problem   . Wears glasses   . Glaucoma     left eye   . Joint pain     foot and knee pain  . Paroxysmal atrial fibrillation 05/2014  . Dysrhythmia     atrial flutter  . Lymphedema of arm     left   Past Surgical History  Procedure Laterality Date  . Tubal ligation  1984  . Colonoscopy N/A 02/05/2013    Procedure: COLONOSCOPY;  Surgeon: [name], MD;  [facility];  Service: Endoscopy;  Laterality: N/A;  830  . Cardioversion N/A 10/21/2014    Procedure: CARDIOVERSION;  Surgeon: [name], MD;  Location: [facility] ENDOSCOPY;  Service: Cardiovascular;  Laterality: N/A;  . Breast surgery  2010- right and 2011-left    for cancer  . Hysteroscopy w/d&c N/A 12/29/2014    Procedure: DILATATION AND CURETTAGE /HYSTEROSCOPY;  Surgeon: [name], MD;  Location: [facility] ORS;  Service: Gynecology;  Laterality: N/A;    Current Outpatient Prescriptions  Medication Sig Dispense Refill  . albuterol (PROVENTIL HFA;VENTOLIN HFA) 108 (90 BASE) MCG/ACT inhaler Inhale into the lungs every 6 (six)  hours as needed for wheezing or shortness of breath.    Marland Kitchen apixaban (ELIQUIS) 5 MG TABS tablet Take 1 tablet (5 mg total) by mouth 2 (two) times daily. 180 tablet 3  . brimonidine (ALPHAGAN) 0.2 % ophthalmic solution Place 1 drop into the left eye 2 (two) times daily.     . Calcium Carbonate-Vit D-Min (CALTRATE 600+D PLUS PO) Take 1 tablet by mouth daily.    . Cholecalciferol (VITAMIN D PO) Take 2,000 Units by mouth daily.     . Coenzyme Q10 (COQ-10) 200 MG CAPS Take 1 capsule by mouth daily.    . Cranberry 500 MG CAPS Take 500 mg by mouth daily.      Marland Kitchen diltiazem (CARDIZEM CD) 300 MG 24 hr capsule Take 1 capsule (300 mg total) by mouth daily. 90 capsule 3  . fexofenadine (ALLEGRA) 180 MG tablet Take 180 mg by mouth daily as needed for allergies or rhinitis.    .  furosemide (LASIX) 20 MG tablet Take 1 tablet (20 mg total) by mouth daily as needed. Take daily as needed for swelling 90 tablet 3  . LUMIGAN 0.01 % SOLN Place 1 drop into both eyes at bedtime.    . Multiple Vitamin (MULTIVITAMIN) tablet Take 1 tablet by mouth daily.     . Omega-3 Fatty Acids (FISH OIL) 1200 MG CAPS Take 1 capsule by mouth daily.    Marland Kitchen ketorolac (TORADOL) 10 MG tablet Take 1 tablet (10 mg total) by mouth every 8 (eight) hours as needed. (Patient not taking: Reported on 01/05/2015) 15 tablet 0   No current facility-administered medications for this encounter.    Allergies  Allergen Reactions  . Metoprolol     Makes her feel bad  . Reglan [Metoclopramide] Other (See Comments)    Causes severe jitters    History   Social History  . Marital Status: Widowed    Spouse Name: N/A  . Number of Children: N/A  . Years of Education: N/A   Occupational History  . Not on file.   Social History Main Topics  . Smoking status: Former Smoker -- 1.00 packs/day for 2 years    Types: Cigarettes    Start date: 09/17/1962    Quit date: 11/16/1966  . Smokeless tobacco: Never Used  . Alcohol Use: No  . Drug Use: No  . Sexual Activity: Yes    Birth Control/ Protection: Post-menopausal   Other Topics Concern  . Not on file   Social History Narrative   Lives alone in [place]   Retired   Worked previously for [employer] and [employer]    Family History  Problem Relation Age of Onset  . Heart disease Mother   . Stroke Father   . Cancer Father   . Early death Brother   . Cancer Sister   . Cancer Sister   . Cancer Sister   . Diabetes Daughter     ROS- All systems are reviewed and negative except as per the HPI above  Physical Exam: Filed Vitals:   01/12/15 1537  BP: 120/78  Pulse: 137  Height: 5\' 3"  (1.6 m)  Weight: 194 lb (87.998 kg)    GEN- The patient is well appearing, alert and oriented x 3 today.   Head- normocephalic, atraumatic Eyes-  Sclera clear,  conjunctiva pink Ears- hearing intact Oropharynx- clear Neck- supple, no JVP Lymph- no cervical lymphadenopathy Lungs- Clear to ausculation bilaterally, normal work of breathing Heart- Irregular rapid rate and rhythm, no murmurs, rubs  or gallops, PMI not laterally displaced GI- soft, NT, ND, + BS Extremities- no clubbing, cyanosis, or edema MS- no significant deformity or atrophy Skin- no rash or lesion Psych- euthymic mood, full affect Neuro- strength and sensation are intact  EKG today reveals afib 137 bpm. Echo is reviewed Epic records reviewed.  Assessment and Plan:  1.  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   Has failed DCCV 2/16 and flecainide. She would like to pursue ablation. Continue Eliquis   2. Snoring She does not think she snores excessively and does not describe daytime somnolence.    Appointment to be made with Dr. [name] to further discuss ablation. F/u here as needed. Marland Kitchen

## 2015-01-13 NOTE — Addendum Note (Signed)
Encounter addended by: Yehuda Mao on: 01/13/2015  7:33 AM<BR>     Documentation filed: Charges VN

## 2015-01-17 ENCOUNTER — Ambulatory Visit (HOSPITAL_COMMUNITY): Payer: Medicare Other | Attending: Hematology & Oncology | Admitting: Physical Therapy

## 2015-01-17 ENCOUNTER — Encounter (HOSPITAL_COMMUNITY): Payer: Medicare Other | Admitting: Physical Therapy

## 2015-01-17 DIAGNOSIS — R29898 Other symptoms and signs involving the musculoskeletal system: Secondary | ICD-10-CM | POA: Insufficient documentation

## 2015-01-17 DIAGNOSIS — R6889 Other general symptoms and signs: Secondary | ICD-10-CM | POA: Diagnosis not present

## 2015-01-17 DIAGNOSIS — I890 Lymphedema, not elsewhere classified: Secondary | ICD-10-CM | POA: Insufficient documentation

## 2015-01-17 DIAGNOSIS — Z853 Personal history of malignant neoplasm of breast: Secondary | ICD-10-CM

## 2015-01-17 NOTE — Therapy (Signed)
[place], [place], [address] Phone: [phone]   Fax:  [phone]  Physical Therapy Treatment  Patient Details  Name: Heidi Lopez MRN: [id number] Date of Birth: 09-Jan-1946 Referring Provider:  Patrici Ranks, MD  Encounter Date: 01/17/2015      PT End of Session - 01/17/15 1317    Visit Number 3   Number of Visits 16   Date for PT Re-Evaluation 02/03/15   Authorization Type Medicare    Authorization Time Period 01/06/15 to 03/08/15   Authorization - Visit Number 3   Authorization - Number of Visits 10   PT Start Time 0850   PT Stop Time 0930   PT Time Calculation (min) 40 min   Activity Tolerance Patient tolerated treatment well   Behavior During Therapy [facility] for tasks assessed/performed      Past Medical History  Diagnosis Date  . Cancer of breast July 2010    s/p XRT 2010 and again in 2011  . Glaucoma   . Cramps, muscle, general     severe breast cramping   . Night sweats   . Pain     breast pain  . Chronic coughing   . Sinus problem   . Wears glasses   . Glaucoma     left eye   . Joint pain     foot and knee pain  . Paroxysmal atrial fibrillation 05/2014  . Dysrhythmia     atrial flutter  . Lymphedema of arm     left    Past Surgical History  Procedure Laterality Date  . Tubal ligation  1984  . Colonoscopy N/A 02/05/2013    Procedure: COLONOSCOPY;  Surgeon: [name], MD;  Location: [facility];  Service: Endoscopy;  Laterality: N/A;  830  . Cardioversion N/A 10/21/2014    Procedure: CARDIOVERSION;  Surgeon: [name], MD;  Location: [facility] ENDOSCOPY;  Service: Cardiovascular;  Laterality: N/A;  . Breast surgery  2010- right and 2011-left    for cancer  . Hysteroscopy w/d&c N/A 12/29/2014    Procedure: DILATATION AND CURETTAGE /HYSTEROSCOPY;  Surgeon: [name], MD;  Location: [facility] ORS;  Service: Gynecology;  Laterality: N/A;    There were no vitals filed for this  visit.  Visit Diagnosis:  History of breast cancer in female  Weakness of shoulder  Decreased functional activity tolerance  Lymphedema      Subjective Assessment - 01/17/15 1317    Subjective Pt reports compliance with self massage and therex at home. No pain.   Currently in Pain? No/denies                         [facility] Adult PT Treatment/Exercise - 01/17/15 1317    Manual Therapy   Manual Therapy Manual Lymphatic Drainage (MLD)   Manual Lymphatic Drainage (MLD) for Lt UE routing fluid to Lt inguinal nodes and also to deep and superficial abdominal, clavical and lumbar nodes.                   PT Short Term Goals - 01/06/15 1013    PT SHORT TERM GOAL #1   Title Patient will improve overall upper extremity strength to at least 4+/5 bilaterally    Time 4   Period Weeks   Status New   PT SHORT TERM GOAL #2   Title Patient will be able to verbally state the importance of good  postural awareness and will be able to maintain good posture on a consistent basis    Time 4   Period Weeks   Status New   PT SHORT TERM GOAL #3   Title Patient will be able to verbalize the importance of and will consistently adhere to positioning techniques when at seated rest and when asleep to assist in managing lymphedema L arm    Time 4   Period Weeks   Status New   PT SHORT TERM GOAL #4   Title Patient will be independent in correctly and consistently performing appropriate HEP and appropriate lymphedema home management techniques    Time 4   Period Weeks   Status New           PT Long Term Goals - 01/06/15 1014    PT LONG TERM GOAL #1   Title Patient will demonstrate 5/5 strength in bilateral upper extremities    Time 8   Period Weeks   Status New   PT LONG TERM GOAL #2   Title Patient will demonstrate a decrease in girth measures L upper extremity by at least 50% and will consistently maintain this decrease in the 2-3 weeks before discharge    Time 8    Period Weeks   Status New   PT LONG TERM GOAL #3   Title Patient will be independent in home management of lymphedema L arm, including positioning strategies and in activity parameters    Time 8   Period Weeks   Status New   PT LONG TERM GOAL #4   Title Patient to report increased activity tolerance as evidenced by consistent ability to ambulate at least 2 miles a day with minimal fatigue and no A-fib symptoms    Time 8   Period Weeks   Status New               Plan - 01/17/15 1342    Clinical Impression Statement Continued manual lymph drainage for LT UE routing to Lt inguinal nodes with activation of deep abdominal and lumbar nodes.  MLD completed anteriorly and posteriorly without difficulty or discomfort.     Pt will benefit from skilled therapeutic intervention in order to improve on the following deficits --  lymphedema L upper extremity    PT Next Visit Plan Continue manual lymphatic drainage for Lt UE and exercises/activities as appropriate.  Measure next visit to see if fluid difference is less than 324.48 cc.     Consulted and Agree with Plan of Care Patient        Problem List Patient Active Problem List   Diagnosis Date Noted  . Atypical glandular cells of undetermined significance (AGUS) on cervical Pap smear 11/23/2014  . Thickened endometrium 11/23/2014  . Use of tamoxifen (Nolvadex) 11/23/2014  . Snoring 09/20/2014  . A-fib 06/18/2014  . Pain in joint, ankle and foot 10/16/2011  . History of breast cancer, bilateral: Right T1N0 Lumpectomy July 2010, Left T1c, N0 Lumpectomy 04/26/2010. 05/04/2011    Teena Irani, PTA/CLT [phone] 01/17/2015, 1:45 PM  [place] [address] [place], [place], [address] Phone: [phone]   Fax:  [phone]

## 2015-01-20 ENCOUNTER — Ambulatory Visit (HOSPITAL_COMMUNITY): Payer: Medicare Other | Admitting: Physical Therapy

## 2015-01-20 ENCOUNTER — Encounter (HOSPITAL_COMMUNITY): Payer: Medicare Other | Admitting: Physical Therapy

## 2015-01-20 DIAGNOSIS — I890 Lymphedema, not elsewhere classified: Secondary | ICD-10-CM

## 2015-01-20 DIAGNOSIS — R6889 Other general symptoms and signs: Secondary | ICD-10-CM

## 2015-01-20 DIAGNOSIS — R29898 Other symptoms and signs involving the musculoskeletal system: Secondary | ICD-10-CM

## 2015-01-20 DIAGNOSIS — Z853 Personal history of malignant neoplasm of breast: Secondary | ICD-10-CM

## 2015-01-20 NOTE — Therapy (Signed)
[place], [place], [address] Phone: [phone]   Fax:  [phone]  Physical Therapy Treatment  Patient Details  Name: Heidi Lopez MRN: [id number] Date of Birth: 1946/02/17 Referring Provider:  Patrici Ranks, MD  Encounter Date: 01/20/2015      PT End of Session - 01/20/15 1200    Visit Number 4   Number of Visits 16   Date for PT Re-Evaluation 02/03/15   Authorization Type Medicare    Authorization Time Period 01/06/15 to 03/08/15   Authorization - Visit Number 4   Authorization - Number of Visits 10   PT Start Time 0804   PT Stop Time 7408   PT Time Calculation (min) 43 min   Activity Tolerance Patient tolerated treatment well   Behavior During Therapy [facility] for tasks assessed/performed      Past Medical History  Diagnosis Date  . Cancer of breast July 2010    s/p XRT 2010 and again in 2011  . Glaucoma   . Cramps, muscle, general     severe breast cramping   . Night sweats   . Pain     breast pain  . Chronic coughing   . Sinus problem   . Wears glasses   . Glaucoma     left eye   . Joint pain     foot and knee pain  . Paroxysmal atrial fibrillation 05/2014  . Dysrhythmia     atrial flutter  . Lymphedema of arm     left    Past Surgical History  Procedure Laterality Date  . Tubal ligation  1984  . Colonoscopy N/A 02/05/2013    Procedure: COLONOSCOPY;  Surgeon: [name], MD;  Location[facility] ENDO [facility];  Service: Endoscopy;  Laterality: N/A;  830  . Cardioversion N/A 10/21/2014    Procedure: CARDIOVERSION;  Surgeon: [name], MD;  Location: [facility] ENDOSCOPY;  Service: Cardiovascular;  Laterality: N/A;  . Breast surgery  2010- right and 2011-left    for cancer  . Hysteroscopy w/d&c N/A 12/29/2014    Procedure: DILATATION AND CURETTAGE /HYSTEROSCOPY;  Surgeon: [name], MD;  Location: [facility] ORS;  Service: Gynecology;  Laterality: N/A;    There were no vitals filed for this  visit.  Visit Diagnosis:  History of breast cancer in female  Weakness of shoulder  Decreased functional activity tolerance  Lymphedema      Subjective Assessment - 01/20/15 1159    Subjective PT states she still cannot tell a big difference in her LT UE, however it is not hurting.   Currently in Pain? No/denies                         [facility] Adult PT Treatment/Exercise - 01/20/15 1200    Manual Therapy   Manual Therapy Manual Lymphatic Drainage (MLD)   Manual Lymphatic Drainage (MLD) for Lt UE routing fluid to Lt inguinal nodes and also to deep and superficial abdominal, clavical and lumbar nodes.           Date 01/12/2015  01/20/2015   Right *left left  MCP 18.80 19.00 18.8  WRIST 16 16 15.20  4cm 17.00 16.80 16.70  8cm 19.50 20.40 19.80  12 cm 23.40 23.30 23.00  16cm 24.50 25.00 25.00  20cm 25.00 26.00 26.50  24cm 25.00 28.00 28.80  28cm 27.40 30.30 29.70  32cm 28.80 32.00 31.20  36cm 32.60 34.00 32.80  40cm 32.60 35.50 34.60  44cm 35.00 36.40 35.50                                Sum of squares 8607.22 9626.59 9329.88  Total Volume 2739.764 3064.24 2969.794             PT Short Term Goals - 01/06/15 1013    PT SHORT TERM GOAL #1   Title Patient will improve overall upper extremity strength to at least 4+/5 bilaterally    Time 4   Period Weeks   Status New   PT SHORT TERM GOAL #2   Title Patient will be able to verbally state the importance of good postural awareness and will be able to maintain good posture on a consistent basis    Time 4   Period Weeks   Status New   PT SHORT TERM GOAL #3   Title Patient will be able to verbalize the importance of and will consistently adhere to positioning techniques when at seated rest and when asleep to assist in managing lymphedema L arm    Time 4   Period Weeks   Status New   PT SHORT TERM GOAL #4   Title Patient will be independent in correctly and consistently performing appropriate  HEP and appropriate lymphedema home management techniques    Time 4   Period Weeks   Status New           PT Long Term Goals - 01/06/15 1014    PT LONG TERM GOAL #1   Title Patient will demonstrate 5/5 strength in bilateral upper extremities    Time 8   Period Weeks   Status New   PT LONG TERM GOAL #2   Title Patient will demonstrate a decrease in girth measures L upper extremity by at least 50% and will consistently maintain this decrease in the 2-3 weeks before discharge    Time 8   Period Weeks   Status New   PT LONG TERM GOAL #3   Title Patient will be independent in home management of lymphedema L arm, including positioning strategies and in activity parameters    Time 8   Period Weeks   Status New   PT LONG TERM GOAL #4   Title Patient to report increased activity tolerance as evidenced by consistent ability to ambulate at least 2 miles a day with minimal fatigue and no A-fib symptoms    Time 8   Period Weeks   Status New               Plan - 01/20/15 1202    Clinical Impression Statement Continued MLD today and remeasured Lt UE for progress.  Reduction of 94.45 cc from last week, however LT UE remains approx 230 cc with greater volume than Rt UE.  Recommended patient go ahead and make appointment to get fitted next week for compression garment and will use in conjunct to remaining therapy.  Order faxed to MD for signature.  Pt chose [facility] to get her garment.       Pt will benefit from skilled therapeutic intervention in order to improve on the following deficits --  lymphedema L upper extremity    PT Next Visit Plan Continue manual lymphatic drainage for Lt UE and exercises/activities as appropriate.  Measure every week for progress.   Consulted and Agree with Plan of Care Patient  Problem List Patient Active Problem List   Diagnosis Date Noted  . Atypical glandular cells of undetermined significance (AGUS) on cervical Pap smear 11/23/2014   . Thickened endometrium 11/23/2014  . Use of tamoxifen (Nolvadex) 11/23/2014  . Snoring 09/20/2014  . A-fib 06/18/2014  . Pain in joint, ankle and foot 10/16/2011  . History of breast cancer, bilateral: Right T1N0 Lumpectomy July 2010, Left T1c, N0 Lumpectomy 04/26/2010. 05/04/2011    Teena Irani, PTA/CLT [phone]  01/20/2015, 12:06 PM  [place] [address] [place], [place], [address] Phone: [phone]   Fax:  [phone]

## 2015-01-24 ENCOUNTER — Encounter (HOSPITAL_COMMUNITY): Payer: Self-pay | Admitting: Hematology & Oncology

## 2015-01-24 ENCOUNTER — Encounter (HOSPITAL_COMMUNITY): Payer: Medicare Other | Admitting: Physical Therapy

## 2015-01-24 ENCOUNTER — Ambulatory Visit (HOSPITAL_COMMUNITY): Payer: Medicare Other | Admitting: Physical Therapy

## 2015-01-24 ENCOUNTER — Encounter (HOSPITAL_COMMUNITY): Payer: Medicare Other | Attending: Hematology & Oncology | Admitting: Hematology & Oncology

## 2015-01-24 ENCOUNTER — Encounter (HOSPITAL_BASED_OUTPATIENT_CLINIC_OR_DEPARTMENT_OTHER): Payer: Medicare Other

## 2015-01-24 VITALS — BP 121/54 | HR 74 | Temp 98.6°F | Resp 16 | Wt 198.0 lb

## 2015-01-24 DIAGNOSIS — M8580 Other specified disorders of bone density and structure, unspecified site: Secondary | ICD-10-CM | POA: Diagnosis not present

## 2015-01-24 DIAGNOSIS — C50911 Malignant neoplasm of unspecified site of right female breast: Secondary | ICD-10-CM

## 2015-01-24 DIAGNOSIS — Z170 Estrogen receptor positive status [ER+]: Secondary | ICD-10-CM

## 2015-01-24 DIAGNOSIS — C50912 Malignant neoplasm of unspecified site of left female breast: Secondary | ICD-10-CM | POA: Diagnosis not present

## 2015-01-24 DIAGNOSIS — Z853 Personal history of malignant neoplasm of breast: Secondary | ICD-10-CM

## 2015-01-24 DIAGNOSIS — R6889 Other general symptoms and signs: Secondary | ICD-10-CM

## 2015-01-24 DIAGNOSIS — I890 Lymphedema, not elsewhere classified: Secondary | ICD-10-CM | POA: Diagnosis not present

## 2015-01-24 DIAGNOSIS — R29898 Other symptoms and signs involving the musculoskeletal system: Secondary | ICD-10-CM

## 2015-01-24 DIAGNOSIS — Z79899 Other long term (current) drug therapy: Secondary | ICD-10-CM

## 2015-01-24 DIAGNOSIS — Z780 Asymptomatic menopausal state: Secondary | ICD-10-CM

## 2015-01-24 HISTORY — DX: Other specified disorders of bone density and structure, unspecified site: M85.80

## 2015-01-24 MED ORDER — ANASTROZOLE 1 MG PO TABS
1.0000 mg | ORAL_TABLET | Freq: Every day | ORAL | Status: DC
Start: 2015-01-24 — End: 2015-05-19

## 2015-01-24 NOTE — Progress Notes (Signed)
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 but for negative sentinel nodes. Estrogen receptor +95% progesterone receptor +96% Ki-67 marker low at 9% HER-2/neu not amplified status post lumpectomy followed by radiation therapy. Her surgery was 04/26/2010 and she started tamoxifen on 08/16/2010 after radiation therapy finished on 07/21/2010. She is having tremendous sweating and hot flashes .   Currently on Tamoxifen 10 mg daily started by Dr. 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 Estrogen receptors were 96% positive, progesterone  33% positive Ki-67 marker low 8% HER-2/neu negative and she did not receive any hormonal therapy after the radiation on the right side.  DEXA on 06/30/2012 with osteopenia but a noted INCREASE in BMD   Breast Cancer Index with high likelihood of benefit from extended endocrine therapy. BCI prognostic high risk category.   CURRENT THERAPY: None  INTERVAL HISTORY: Heidi Lopez 69 y.o. female returns for follow-up of bilateral breast cancers. She reports going for a Pap smear which came back abnormal. She was noted on ultrasound to have a thickened endometrium. She has not had any vaginal bleeding. She underwent a hysteroscopy and D&C, final pathology was negative. Vegetable matter reported on her path report and she has a copy of this. She is inquiring what back mean.  She has had some problems with atrial fibrillation. She is currently on eloquis. She denies any bleeding or bruising. She states she is being evaluated for an ablation.   She is up-to-date on mammography and screening colonoscopy. She is here today to discuss her breast cancer index results.  She takes  2000 units Vitamin D and takes Calcium with Vitamin D supplements, totaling about 3000 units Vitamin D. She says the Vitamin D has helped with her joint pain.   Has an appointment next Tuesday at [facility] to get a sleeve for her left upper arm.   MEDICAL HISTORY: Past Medical History  Diagnosis Date  . Cancer of breast July 2010    s/p XRT 2010 and again in 2011  . Glaucoma   . Cramps, muscle, general     severe breast cramping   . Night sweats   . Pain     breast pain  . Chronic coughing   . Sinus problem   . Wears glasses   . Glaucoma     left eye   . Joint pain     foot and knee pain  . Paroxysmal atrial fibrillation 05/2014  . Dysrhythmia     atrial flutter  . Lymphedema of arm     left  . Osteopenia 01/24/2015    has History of breast cancer, bilateral: Right T1N0 Lumpectomy July 2010, Left T1c, N0 Lumpectomy 04/26/2010.; Pain in joint, ankle and foot; A-fib; Snoring; Atypical glandular cells of undetermined significance (AGUS) on cervical Pap smear; Thickened endometrium; Use of tamoxifen (Nolvadex); and Osteopenia on her problem list.     is allergic to metoprolol and reglan.  Heidi Lopez does not currently have medications on file.  SURGICAL HISTORY: Past Surgical History  Procedure Laterality Date  . Tubal ligation  1984  . Colonoscopy N/A 02/05/2013    Procedure: COLONOSCOPY;  Surgeon: [name], MD;  [facility];  Service: Endoscopy;  Laterality: N/A;  830  . Cardioversion N/A 10/21/2014    Procedure: CARDIOVERSION;  Surgeon: [name], MD;  Location: [facility] ENDOSCOPY;  Service: Cardiovascular;  Laterality: N/A;  . Breast surgery  2010- right and 2011-left    for cancer  . Hysteroscopy w/d&c N/A 12/29/2014    Procedure: DILATATION AND CURETTAGE /HYSTEROSCOPY;  Surgeon: [name], MD;  Location: [facility] ORS;  Service: Gynecology;  Laterality: N/A;    SOCIAL HISTORY: History   Social History  . Marital Status: Widowed    Spouse Name: N/A  .  Number of Children: N/A  . Years of Education: N/A   Occupational History  . Not on file.   Social History Main Topics  . Smoking status: Former Smoker -- 1.00 packs/day for 2 years    Types: Cigarettes    Start date: 09/17/1962    Quit date: 11/16/1966  . Smokeless tobacco: Never Used  . Alcohol Use: No  . Drug Use: No  . Sexual Activity: Yes    Birth Control/ Protection: Post-menopausal   Other Topics Concern  . Not on file   Social History Narrative   Lives alone in [place]   Retired   Worked previously for [employer] and [employer]    FAMILY HISTORY: Family History  Problem Relation Age of Onset  . Heart disease Mother   . Stroke Father   . Cancer Father   . Early death Brother   . Cancer Sister   . Cancer Sister   . Cancer Sister   . Diabetes Daughter     Review of Systems  Constitutional: Negative for fever, chills, weight loss and malaise/fatigue.  HENT: Negative for congestion, hearing loss, nosebleeds, sore throat and tinnitus.   Eyes: Negative for blurred vision, double vision, pain and discharge.  Respiratory: Negative for cough, hemoptysis, sputum production, shortness of breath and wheezing.   Cardiovascular: Negative for chest pain, palpitations, claudication, leg swelling and PND.  Gastrointestinal: Negative for heartburn, nausea, vomiting, abdominal pain, diarrhea, constipation, blood in stool and melena.  Genitourinary: Negative for dysuria, urgency, frequency and hematuria.  Musculoskeletal: Negative for myalgias, joint pain and falls.  Skin: Negative for itching and rash.  Neurological: Negative for dizziness, tingling, tremors, sensory change, speech change, focal weakness, seizures, loss of consciousness, weakness and headaches.  Endo/Heme/Allergies: Does not bruise/bleed easily.  Psychiatric/Behavioral: Negative for depression, suicidal ideas, memory loss and substance abuse. The patient is not nervous/anxious and does not have insomnia.      PHYSICAL EXAMINATION  ECOG PERFORMANCE STATUS: 0 - Asymptomatic  Filed Vitals:   01/24/15 1311  BP: 121/54  Pulse: 74  Temp: 98.6 F (37 C)  Resp: 16    Physical Exam  Constitutional: She is oriented to person, place, and time and well-developed, well-nourished, and in no distress.  HENT:  Head: Normocephalic and atraumatic.  Nose: Nose normal.  Mouth/Throat: Oropharynx is clear and moist. No oropharyngeal exudate.  Eyes: Conjunctivae and EOM are normal. Pupils are equal, round, and reactive to light. Right eye exhibits no discharge. Left eye exhibits no discharge. No scleral icterus.  Neck: Normal range of motion. Neck supple. No tracheal deviation present. No thyromegaly present.  Cardiovascular:   Exam reveals no gallop and no friction rub.   No murmur heard.     A Fib. Pulmonary/Chest: Effort normal and breath sounds normal. She has no wheezes. She has no rales.    Abdominal: Soft. Bowel sounds are normal. She exhibits no distension and  no mass. There is no tenderness. There is no rebound and no guarding.  Musculoskeletal: Normal range of motion. She exhibits no edema.  Lymphadenopathy:    She has no cervical adenopathy.  Neurological: She is alert and oriented to person, place, and time. She has normal reflexes. No cranial nerve deficit. Gait normal. Coordination normal.  Skin: Skin is warm and dry. No rash noted.  Psychiatric: Mood, memory, affect and judgment normal.  Nursing note and vitals reviewed.   LABORATORY DATA:  CBC    Component Value Date/Time   WBC 7.9 12/24/2014 1420   RBC 4.39 12/24/2014 1420   HGB 13.6 12/24/2014 1420   HCT 40.2 12/24/2014 1420   PLT 196 12/24/2014 1420   MCV 91.6 12/24/2014 1420   MCH 31.0 12/24/2014 1420   MCHC 33.8 12/24/2014 1420   RDW 12.7 12/24/2014 1420   LYMPHSABS 2.6 12/23/2014 0858   MONOABS 0.9 12/23/2014 0858   EOSABS 0.3 12/23/2014 0858   BASOSABS 0.0 12/23/2014 0858   CMP     Component Value Date/Time    NA 140 12/24/2014 1420   K 4.2 12/24/2014 1420   CL 106 12/24/2014 1420   CO2 24 12/24/2014 1420   GLUCOSE 86 12/24/2014 1420   BUN 11 12/24/2014 1420   CREATININE 0.67 12/24/2014 1420   CALCIUM 10.0 12/24/2014 1420   PROT 7.0 12/24/2014 1420   ALBUMIN 4.3 12/24/2014 1420   AST 39* 12/24/2014 1420   ALT 43* 12/24/2014 1420   ALKPHOS 67 12/24/2014 1420   BILITOT 0.4 12/24/2014 1420   GFRNONAA 88* 12/24/2014 1420   GFRAA >90 12/24/2014 1420    PATHOLOGY:L DIAGNOSIS Diagnosis Endometrium, curettage - FRAGMENTS OF BENIGN ENDOMETRIAL TYPE POLYP. - BENIGN ENDOCERVICAL AND SQUAMOUS MUCOSA. - VEGETABLE MATTER. - NO MALIGNANCY IDENTIFIED. - SEE COMMENT. Microscopic Comment There are fragments of benign endometrial type polyp. There are only a few scant fragments of inactive endometrium in the background. There is abundant vegetable matter, which may represent contamination if the patient does not have a known fistula. Clinical correlation is recommended. Vicente Males MD Pathologist, Electronic Signature (Case signed 12/31/2014)    RADIOGRAPHIC STUDIES: 11/23/2014 SHANEQUE MERKLE is a 69 y.o. for a pelvic sonogram for abnl pap smear results with endometrial cells present. Pt H/O Breast Ca on Tamoxifen. Pt notes no vaginal bleeding although pelvic cramping present. Complex heterogenous cystic endometrium Normal uterus otherwise Normal ovaries   EURE,LUTHER H 11/23/2014 3:34 PM    ASSESSMENT and THERAPY PLAN:   History of ER+ bilateral breast cancer Intolerance to $RemoveBefore'20mg'[id number]$  Tamoxifen secondary to hot flashes Intolerance to aromasin secondary to hot flashes Abnormal endometrial lining, with hysteroscopy and D&C without significant pathology BCI prognostic; high risk category, BCI predictive high likelihood of benefit from extended endocrine RX Osteopenia  I called Dr. Tammi Klippel in regards to the patient's endometrial pathology. I advised the patient that the "vegetable matter" is  most likely a contaminant. She does not have a history of a fistula.  We reviewed the results of her breast cancer index. I advised her that based upon the results she would highly benefit from taking endocrine therapy for a total of 10 years. She has been on tamoxifen and only a 10 mg dose because of hot flashes. She has tried Aromasin in the past and states she could not take it for several reasons, hot flashes were also problematic. She has a sister on Arimidex and is inquiring about that. I have written her a prescription for Arimidex 1  mg daily. I discussed the risks and benefits of AI therapy including but not limited to joint pain, hot flashes, worsening cholesterol profile and worsening osteopenia.  She had osteopenia on a DEXA from 2013. We will repeat her DEXA scan. I have discussed the use of Prolia in women on aromatase inhibitors with osteopenia. Address this at her follow-up in 6 weeks.  She is to continue on her calcium and vitamin D. We will check a vitamin D level at her next visit. She will also need a fasting lipid profile as she has not had one done she thinks in several years.  She was given a prescription for a lymphedema sleeve. We discussed the use of the breast cancer index to tell if she would benefit from prolonged endocrine therapy. We will try to obtain a BCI on her prior malignancies and I advised her this would help [place] make a more informed decision regarding continuing her endocrine therapy beyond 5 years.  I will see her back in a month. We will readdress all of the above issues at follow-up.  All questions were answered. The patient knows to call the clinic with any problems, questions or concerns. We can certainly see the patient much sooner if necessary. This note was electronically signed.  This document serves as a record of services personally performed by Ancil Linsey, MD. It was created on her behalf by Arlyce Harman, a trained medical scribe. The  creation of this record is based on the scribe's personal observations and the provider's statements to them. This document has been checked and approved by the attending provider.  I have reviewed the above documentation for accuracy and completeness, and I agree with the above. This note was electronically signed.  Ancil Linsey, MD

## 2015-01-24 NOTE — Therapy (Signed)
[place], [place], [address] Phone: [phone]   Fax:  [phone]  Physical Therapy Treatment  Patient Details  Name: Heidi Lopez MRN: [id number] Date of Birth: May 09, 1946 Referring Provider:  Patrici Ranks, MD  Encounter Date: 01/24/2015      PT End of Session - 01/24/15 0937    Visit Number 5   Number of Visits 16   Date for PT Re-Evaluation 02/03/15   Authorization Type Medicare    Authorization Time Period 01/06/15 to 03/08/15   Authorization - Visit Number 5   Authorization - Number of Visits 10   PT Start Time 0850   PT Stop Time 0930   PT Time Calculation (min) 40 min      Past Medical History  Diagnosis Date  . Cancer of breast July 2010    s/p XRT 2010 and again in 2011  . Glaucoma   . Cramps, muscle, general     severe breast cramping   . Night sweats   . Pain     breast pain  . Chronic coughing   . Sinus problem   . Wears glasses   . Glaucoma     left eye   . Joint pain     foot and knee pain  . Paroxysmal atrial fibrillation 05/2014  . Dysrhythmia     atrial flutter  . Lymphedema of arm     left    Past Surgical History  Procedure Laterality Date  . Tubal ligation  1984  . Colonoscopy N/A 02/05/2013    Procedure: COLONOSCOPY;  Surgeon: [name], MD;  Location[facility];  Service: Endoscopy;  Laterality: N/A;  830  . Cardioversion N/A 10/21/2014    Procedure: CARDIOVERSION;  Surgeon: [name], MD;  Location: [facility] ENDOSCOPY;  Service: Cardiovascular;  Laterality: N/A;  . Breast surgery  2010- right and 2011-left    for cancer  . Hysteroscopy w/d&c N/A 12/29/2014    Procedure: DILATATION AND CURETTAGE /HYSTEROSCOPY;  Surgeon: [name], MD;  Location: [facility] ORS;  Service: Gynecology;  Laterality: N/A;    There were no vitals filed for this visit.  Visit Diagnosis:  History of breast cancer in female  Weakness of shoulder  Decreased functional  activity tolerance  Lymphedema      Subjective Assessment - 01/24/15 0932    Subjective Pt contiues to state that she is not able to see a difference in her swelling.  Pt states that the majoity of her swelling is in her upper arm.     Pertinent History Patient has had cancer in both breasts with most recent being 2011; currently is dealing with lympedema in L arm after cancer in 2011. Did lumpectomies for both breasts. Took radiation on both sides and had been taking Tamoxifen; has been off of it recently due to concern with uterus thickening but may go back on it soon.    Patient Stated Goals main concern is the swelling in her arm                [facility] Adult PT Treatment/Exercise - 01/24/15 0932    Manual Therapy   Manual Therapy Manual Lymphatic Drainage (MLD)   Manual Lymphatic Drainage (MLD) Recieved supraclavicular followed by deep and superficial abdominal, Lt axillary-inguinal anastomosis both anteriorly and posteriorly with concentration along subaxillary area.  PT Short Term Goals - 01/24/15 0940    PT SHORT TERM GOAL #1   Title Patient will improve overall upper extremity strength to at least 4+/5 bilaterally    Time 4   Period Weeks   Status On-going   PT SHORT TERM GOAL #2   Title Patient will be able to verbally state the importance of good postural awareness and will be able to maintain good posture on a consistent basis    Time 4   Period Weeks   Status On-going   PT SHORT TERM GOAL #3   Title Patient will be able to verbalize the importance of and will consistently adhere to positioning techniques when at seated rest and when asleep to assist in managing lymphedema L arm    Time 4   Status On-going   PT SHORT TERM GOAL #4   Title Patient will be independent in correctly and consistently performing appropriate HEP and appropriate lymphedema home management techniques    Time 4   Period Weeks   Status On-going           PT  Long Term Goals - 01/24/15 0941    PT LONG TERM GOAL #1   Title Patient will demonstrate 5/5 strength in bilateral upper extremities    Time 8   Period Weeks   Status On-going   PT LONG TERM GOAL #2   Title Patient will demonstrate a decrease in girth measures L upper extremity by at least 50% and will consistently maintain this decrease in the 2-3 weeks before discharge    Time 8   Status On-going   PT LONG TERM GOAL #3   Title Patient will be independent in home management of lymphedema L arm, including positioning strategies and in activity parameters    Time 8   Status On-going   PT LONG TERM GOAL #4   Title Patient to report increased activity tolerance as evidenced by consistent ability to ambulate at least 2 miles a day with minimal fatigue and no A-fib symptoms    Time 8   Status On-going               Plan - 01/24/15 8250    Clinical Impression Statement noted increased swelling subaxillary area with increased effort given to this area.  Pt states [facility] states that insurance will not cover garment.     Rehab Potential Good   PT Next Visit Plan Continue manual lymphatic drainage for Lt UE and exercises/activities as appropriate.  Measure next session    Consulted and Agree with Plan of Care Patient        Problem List Patient Active Problem List   Diagnosis Date Noted  . Atypical glandular cells of undetermined significance (AGUS) on cervical Pap smear 11/23/2014  . Thickened endometrium 11/23/2014  . Use of tamoxifen (Nolvadex) 11/23/2014  . Snoring 09/20/2014  . A-fib 06/18/2014  . Pain in joint, ankle and foot 10/16/2011  . History of breast cancer, bilateral: Right T1N0 Lumpectomy July 2010, Left T1c, N0 Lumpectomy 04/26/2010. 05/04/2011   Rayetta Humphrey, PT CLT [phone] 01/24/2015, 9:42 AM  [place], [place], [address] Phone: [phone]   Fax:  [phone]

## 2015-01-24 NOTE — Progress Notes (Signed)
LABS DRAWN

## 2015-01-24 NOTE — Patient Instructions (Signed)
..  [place] at [facility] Discharge Instructions  RECOMMENDATIONS MADE BY THE CONSULTANT AND ANY TEST RESULTS WILL BE SENT TO YOUR REFERRING PHYSICIAN.  Labs today.  Will call with results. Will call with bone density appointment.   Keep future appointments. Call for any new or worsening symptoms, questions, or concerns.   Thank you for choosing [place] at [facility] to provide your oncology and hematology care.  To afford each patient quality time with our provider, please arrive at least 15 minutes before your scheduled appointment time.    You need to re-schedule your appointment should you arrive 10 or more minutes late.  We strive to give you quality time with our providers, and arriving late affects you and other patients whose appointments are after yours.  Also, if you no show three or more times for appointments you may be dismissed from the clinic at the providers discretion.     Again, thank you for choosing [facility].  Our hope is that these requests will decrease the amount of time that you wait before being seen by our physicians.       _____________________________________________________________  Should you have questions after your visit to [facility], please contact our office at [phone] between the hours of 8:30 a.m. and 4:30 p.m.  Voicemails left after 4:30 p.m. will not be returned until the following business day.  For prescription refill requests, have your pharmacy contact our office.

## 2015-01-25 ENCOUNTER — Telehealth (HOSPITAL_COMMUNITY): Payer: Self-pay | Admitting: Emergency Medicine

## 2015-01-25 LAB — CEA: CEA: 2.2 ng/mL (ref 0.0–4.7)

## 2015-01-25 LAB — CANCER ANTIGEN 27.29: CA 27.29: 32.8 U/mL (ref 0.0–38.6)

## 2015-01-25 NOTE — Telephone Encounter (Signed)
-----   Message from Baird Cancer, PA-C sent at 01/25/2015  2:25 PM EDT ----- Stable and similar to Oct 2015.

## 2015-01-25 NOTE — Telephone Encounter (Signed)
Called to notify pt that cancer markers were stable

## 2015-01-26 ENCOUNTER — Ambulatory Visit (INDEPENDENT_AMBULATORY_CARE_PROVIDER_SITE_OTHER): Payer: Medicare Other | Admitting: Internal Medicine

## 2015-01-26 ENCOUNTER — Encounter: Payer: Self-pay | Admitting: *Deleted

## 2015-01-26 ENCOUNTER — Encounter: Payer: Self-pay | Admitting: Internal Medicine

## 2015-01-26 VITALS — BP 146/95 | HR 79 | Ht 63.0 in | Wt 194.2 lb

## 2015-01-26 DIAGNOSIS — I480 Paroxysmal atrial fibrillation: Secondary | ICD-10-CM | POA: Diagnosis not present

## 2015-01-26 DIAGNOSIS — I483 Typical atrial flutter: Secondary | ICD-10-CM | POA: Diagnosis not present

## 2015-01-26 NOTE — Patient Instructions (Signed)
Medication Instructions:  Your physician recommends that you continue on your current medications as directed. Please refer to the Current Medication list given to you today.   Labwork: Your physician recommends that you return for lab work on 02/17/15 anytime between 7am-5pm   Testing/Procedures: Your physician has recommended that you have an ablation. Catheter ablation is a medical procedure used to treat some cardiac arrhythmias (irregular heartbeats). During catheter ablation, a long, thin, flexible tube is put into a blood vessel in your groin (upper thigh), or neck. This tube is called an ablation catheter. It is then guided to your heart through the blood vessel. Radio frequency waves destroy small areas of heart tissue where abnormal heartbeats may cause an arrhythmia to start. Please see the instruction sheet given to you today.    Follow-Up: Follow up will be scheduled at hospital after ablation  Any Other Special Instructions Will Be Listed Below (If Applicable).

## 2015-01-27 ENCOUNTER — Ambulatory Visit (HOSPITAL_COMMUNITY): Payer: Medicare Other | Admitting: Physical Therapy

## 2015-01-27 ENCOUNTER — Encounter (HOSPITAL_COMMUNITY): Payer: Medicare Other | Admitting: Physical Therapy

## 2015-01-27 DIAGNOSIS — R6889 Other general symptoms and signs: Secondary | ICD-10-CM

## 2015-01-27 DIAGNOSIS — I890 Lymphedema, not elsewhere classified: Secondary | ICD-10-CM | POA: Diagnosis not present

## 2015-01-27 DIAGNOSIS — Z853 Personal history of malignant neoplasm of breast: Secondary | ICD-10-CM

## 2015-01-27 DIAGNOSIS — R29898 Other symptoms and signs involving the musculoskeletal system: Secondary | ICD-10-CM

## 2015-01-27 NOTE — Therapy (Signed)
[place], [place], [address] Phone: [phone]   Fax:  [phone]  Physical Therapy Treatment  Patient Details  Name: Heidi Lopez MRN: [id number] Date of Birth: Dec 24, 1945 Referring Provider:  Patrici Ranks, MD  Encounter Date: 01/27/2015      PT End of Session - 01/27/15 1324    Visit Number 6   Number of Visits 16   Authorization Type Medicare    Authorization Time Period 01/06/15 to 03/08/15   Authorization - Visit Number 6   Authorization - Number of Visits 10   PT Start Time [phone]   PT Stop Time 5852   PT Time Calculation (min) 45 min      Past Medical History  Diagnosis Date  . Cancer of breast July 2010    s/p XRT 2010 and again in 2011  . Glaucoma   . Cramps, muscle, general     severe breast cramping   . Night sweats   . Pain     breast pain  . Chronic coughing   . Sinus problem   . Wears glasses   . Glaucoma     left eye   . Joint pain     foot and knee pain  . Paroxysmal atrial fibrillation 05/2014  . Dysrhythmia     atrial flutter  . Lymphedema of arm     left  . Osteopenia 01/24/2015    Past Surgical History  Procedure Laterality Date  . Tubal ligation  1984  . Colonoscopy N/A 02/05/2013    Procedure: COLONOSCOPY;  Surgeon: [name], MD;  [facility];  Service: Endoscopy;  Laterality: N/A;  830  . Cardioversion N/A 10/21/2014    Procedure: CARDIOVERSION;  Surgeon: [name], MD;  Location: [facility] ENDOSCOPY;  Service: Cardiovascular;  Laterality: N/A;  . Breast surgery  2010- right and 2011-left    for cancer  . Hysteroscopy w/d&c N/A 12/29/2014    Procedure: DILATATION AND CURETTAGE /HYSTEROSCOPY;  Surgeon: [name], MD;  Location: [facility] ORS;  Service: Gynecology;  Laterality: N/A;    There were no vitals filed for this visit.  Visit Diagnosis:  History of breast cancer in female  Weakness of shoulder  Decreased functional activity  tolerance  Lymphedema      Subjective Assessment - 01/27/15 1322    Subjective Pt states she has been trying to massage her arm every night.  She has been doing her exericses twice a day.    Currently in Pain? No/denies      Date 01/12/2015 01/12/2015 01/28/2015   Right *left Left   MCP 18.80 19.00 19.5  WRIST 16 16 15.50  4cm 17.00 16.80 17.00  8cm 19.50 20.40 20.00  12 cm 23.40 23.30 23.50  16cm 24.50 25.00 25.10  20cm 25.00 26.00 26.10  24cm 25.00 28.00 27.00  28cm 27.40 30.30 30.40  32cm 28.80 32.00 33.00  36cm 32.60 34.00 34.00  40cm 32.60 35.50 35.30  44cm 35.00 36.40 37.10                                Sum of squares 8607.22 9626.59 9693.63  Total Volume 2739.764 3064.2399 7782.4235                 [facility] Adult PT Treatment/Exercise - 01/27/15 0001    Manual Therapy   Manual Therapy Manual Lymphatic Drainage (MLD)  Manual Lymphatic Drainage (MLD) Recieved supraclavicular followed by deep and superficial abdominal, Lt axillary-inguinal anastomosis both anteriorly and posteriorly with concentration along subaxillary area.                PT Education - 01/27/15 1323    Education provided Yes   Education Details Pt given handout on self lymph massage, explained that from arm to shoulder you want to ensure upward strokes; form underarm to inguinal downward strokes.    Person(s) Educated Patient   Methods Explanation;Demonstration;Handout   Comprehension Verbalized understanding          PT Short Term Goals - 01/24/15 0940    PT SHORT TERM GOAL #1   Title Patient will improve overall upper extremity strength to at least 4+/5 bilaterally    Time 4   Period Weeks   Status On-going   PT SHORT TERM GOAL #2   Title Patient will be able to verbally state the importance of good postural awareness and will be able to maintain good posture on a consistent basis    Time 4   Period Weeks   Status On-going   PT SHORT TERM GOAL #3   Title Patient will  be able to verbalize the importance of and will consistently adhere to positioning techniques when at seated rest and when asleep to assist in managing lymphedema L arm    Time 4   Status On-going   PT SHORT TERM GOAL #4   Title Patient will be independent in correctly and consistently performing appropriate HEP and appropriate lymphedema home management techniques    Time 4   Period Weeks   Status On-going           PT Long Term Goals - 01/24/15 0941    PT LONG TERM GOAL #1   Title Patient will demonstrate 5/5 strength in bilateral upper extremities    Time 8   Period Weeks   Status On-going   PT LONG TERM GOAL #2   Title Patient will demonstrate a decrease in girth measures L upper extremity by at least 50% and will consistently maintain this decrease in the 2-3 weeks before discharge    Time 8   Status On-going   PT LONG TERM GOAL #3   Title Patient will be independent in home management of lymphedema L arm, including positioning strategies and in activity parameters    Time 8   Status On-going   PT LONG TERM GOAL #4   Title Patient to report increased activity tolerance as evidenced by consistent ability to ambulate at least 2 miles a day with minimal fatigue and no A-fib symptoms    Time 8   Status On-going               Plan - 01/27/15 1325    Clinical Impression Statement Pt reemeasured today with overall increase of volume by 21cc.  Pt given self massage sheet as well as presciption for compression garment.  Increased time spent at subaxillary area secondary to increase edema noted in this area.    PT Next Visit Plan Continue manual lymphatic drainage for Lt UE and exercises/activities         Problem List Patient Active Problem List   Diagnosis Date Noted  . Osteopenia 01/24/2015  . Atypical glandular cells of undetermined significance (AGUS) on cervical Pap smear 11/23/2014  . Thickened endometrium 11/23/2014  . Use of tamoxifen (Nolvadex) 11/23/2014   . Snoring 09/20/2014  . A-fib 06/18/2014  .  Pain in joint, ankle and foot 10/16/2011  . History of breast cancer, bilateral: Right T1N0 Lumpectomy July 2010, Left T1c, N0 Lumpectomy 04/26/2010. 05/04/2011   Rayetta Humphrey, PT CLT [phone] 01/27/2015, 1:28 PM  [facility] [address] [place], [place], [address] Phone: [phone]   Fax:  [phone]

## 2015-01-31 ENCOUNTER — Ambulatory Visit (HOSPITAL_COMMUNITY): Payer: Medicare Other | Admitting: Physical Therapy

## 2015-01-31 ENCOUNTER — Encounter (HOSPITAL_COMMUNITY): Payer: Medicare Other | Admitting: Physical Therapy

## 2015-01-31 DIAGNOSIS — I890 Lymphedema, not elsewhere classified: Secondary | ICD-10-CM | POA: Diagnosis not present

## 2015-01-31 DIAGNOSIS — R29898 Other symptoms and signs involving the musculoskeletal system: Secondary | ICD-10-CM

## 2015-01-31 DIAGNOSIS — I4892 Unspecified atrial flutter: Secondary | ICD-10-CM | POA: Insufficient documentation

## 2015-01-31 DIAGNOSIS — Z853 Personal history of malignant neoplasm of breast: Secondary | ICD-10-CM

## 2015-01-31 DIAGNOSIS — R6889 Other general symptoms and signs: Secondary | ICD-10-CM

## 2015-01-31 NOTE — Therapy (Signed)
[place], [place], [address] Phone: [phone]   Fax:  [phone]  Physical Therapy Treatment  Patient Details  Name: Heidi Lopez MRN: [id number] Date of Birth: 1946-06-20 Referring Provider:  Patrici Ranks, MD  Encounter Date: 01/31/2015      PT End of Session - 01/31/15 1107    Visit Number 7   Number of Visits 16   Date for PT Re-Evaluation 02/03/15   Authorization Type Medicare    Authorization Time Period 01/06/15 to 03/08/15   Authorization - Visit Number 7   Authorization - Number of Visits 10   PT Start Time 1020   PT Stop Time 1100   PT Time Calculation (min) 40 min      Past Medical History  Diagnosis Date  . Cancer of breast July 2010    s/p XRT 2010 and again in 2011  . Glaucoma   . Cramps, muscle, general     severe breast cramping   . Night sweats   . Pain     breast pain  . Chronic coughing   . Sinus problem   . Wears glasses   . Glaucoma     left eye   . Joint pain     foot and knee pain  . Paroxysmal atrial fibrillation 05/2014  . Dysrhythmia     atrial flutter  . Lymphedema of arm     left  . Osteopenia 01/24/2015    Past Surgical History  Procedure Laterality Date  . Tubal ligation  1984  . Colonoscopy N/A 02/05/2013    Procedure: COLONOSCOPY;  Surgeon: [name], MD;  [facility];  Service: Endoscopy;  Laterality: N/A;  830  . Cardioversion N/A 10/21/2014    Procedure: CARDIOVERSION;  Surgeon: [name], MD;  Location: [facility] ENDOSCOPY;  Service: Cardiovascular;  Laterality: N/A;  . Breast surgery  2010- right and 2011-left    for cancer  . Hysteroscopy w/d&c N/A 12/29/2014    Procedure: DILATATION AND CURETTAGE /HYSTEROSCOPY;  Surgeon: [name], MD;  Location: [facility] ORS;  Service: Gynecology;  Laterality: N/A;    There were no vitals filed for this visit.  Visit Diagnosis:  History of breast cancer in female  Weakness of  shoulder  Decreased functional activity tolerance  Lymphedema      Subjective Assessment - 01/31/15 1105    Subjective Pt states she is doing well states sthat she has been doing the self massage that therapist gave her last week.  Pt is to be measured for her garment tomorrow.    Currently in Pain? No/denies            [facility] Adult PT Treatment/Exercise - 01/31/15 1106    Manual Therapy   Manual Therapy Manual Lymphatic Drainage (MLD)   Manual Lymphatic Drainage (MLD) Recieved supraclavicular followed by deep and superficial abdominal, Lt axillary-inguinal anastomosis both anteriorly and posteriorly with concentration along subaxillary area.              PT Short Term Goals - 01/24/15 0940    PT SHORT TERM GOAL #1   Title Patient will improve overall upper extremity strength to at least 4+/5 bilaterally    Time 4   Period Weeks   Status On-going   PT SHORT TERM GOAL #2   Title Patient will be able to verbally state the importance of good postural awareness and will be able to maintain good posture on  a consistent basis    Time 4   Period Weeks   Status On-going   PT SHORT TERM GOAL #3   Title Patient will be able to verbalize the importance of and will consistently adhere to positioning techniques when at seated rest and when asleep to assist in managing lymphedema L arm    Time 4   Status On-going   PT SHORT TERM GOAL #4   Title Patient will be independent in correctly and consistently performing appropriate HEP and appropriate lymphedema home management techniques    Time 4   Period Weeks   Status On-going           PT Long Term Goals - 01/24/15 0941    PT LONG TERM GOAL #1   Title Patient will demonstrate 5/5 strength in bilateral upper extremities    Time 8   Period Weeks   Status On-going   PT LONG TERM GOAL #2   Title Patient will demonstrate a decrease in girth measures L upper extremity by at least 50% and will consistently maintain this decrease  in the 2-3 weeks before discharge    Time 8   Status On-going   PT LONG TERM GOAL #3   Title Patient will be independent in home management of lymphedema L arm, including positioning strategies and in activity parameters    Time 8   Status On-going   PT LONG TERM GOAL #4   Title Patient to report increased activity tolerance as evidenced by consistent ability to ambulate at least 2 miles a day with minimal fatigue and no A-fib symptoms    Time 8   Status On-going               Plan - 01/31/15 1107    Clinical Impression Statement Pt continues to have concentration of edema along subaxillary area.  Pt will be measured for garment tomorrow.  Pt may be discharged from therapy once pt has recieved her garment.    PT Next Visit Plan continue with manual lymphatic drainage until pt recieves her compression garment.         Problem List Patient Active Problem List   Diagnosis Date Noted  . Atrial flutter 01/31/2015  . Osteopenia 01/24/2015  . Atypical glandular cells of undetermined significance (AGUS) on cervical Pap smear 11/23/2014  . Thickened endometrium 11/23/2014  . Use of tamoxifen (Nolvadex) 11/23/2014  . Snoring 09/20/2014  . A-fib 06/18/2014  . Pain in joint, ankle and foot 10/16/2011  . History of breast cancer, bilateral: Right T1N0 Lumpectomy July 2010, Left T1c, N0 Lumpectomy 04/26/2010. 05/04/2011   Rayetta Humphrey, PT CLT [phone] 01/31/2015, 11:10 AM  [place], [place], [address] Phone: [phone]   Fax:  [phone]

## 2015-01-31 NOTE — Progress Notes (Signed)
Primary Care Physician: [name], MD Referring Physician:  Dr Doug Sou is a 69 y.o. female with a h/o recently diagnosed atrial fibrillation who presents for EP consultation.  She has a h/o breast CA and underwent XRT in 2010 and again in 2011.  She reports that her symptoms of AF began in 2011 after her XRT.  She has tachypalpitations which are more prominent at night.  Episodes typically last several minutes at a time.  She has episodes most evenings.  She has been evaluated by Dr Harl Bowie and found to have afib with RVR as the cause.  She was been placed on diltiazem.  Dose titration has been limited by edema.  She is also on digoxin.  She has failed medical therapy with flecainide also. Her chads2vasc score is at least 2.  She is anticoagulated with eliquis.  She is tolerating this without difficulty.  She is unaware of triggers or precipitants for her afib.     Today, she denies symptoms of  chest pain,   orthopnea, PND, lower extremity edema, dizziness, presyncope, syncope, or neurologic sequela.  She has frequent URI symptoms with associated SOB.  The patient is tolerating medications without difficulties and is otherwise without complaint today.   Past Medical History  Diagnosis Date  . Cancer of breast July 2010    s/p XRT 2010 and again in 2011  . Glaucoma   . Cramps, muscle, general     severe breast cramping   . Night sweats   . Pain     breast pain  . Chronic coughing   . Sinus problem   . Wears glasses   . Glaucoma     left eye   . Joint pain     foot and knee pain  . Paroxysmal atrial fibrillation 05/2014  . Dysrhythmia     atrial flutter  . Lymphedema of arm     left  . Osteopenia 01/24/2015   Past Surgical History  Procedure Laterality Date  . Tubal ligation  1984  . Colonoscopy N/A 02/05/2013    Procedure: COLONOSCOPY;  Surgeon: [name], MD;  [facility];  Service: Endoscopy;  Laterality: N/A;  830  . Cardioversion N/A 10/21/2014      Procedure: CARDIOVERSION;  Surgeon: [name], MD;  Location: [facility] ENDOSCOPY;  Service: Cardiovascular;  Laterality: N/A;  . Breast surgery  2010- right and 2011-left    for cancer  . Hysteroscopy w/d&c N/A 12/29/2014    Procedure: DILATATION AND CURETTAGE /HYSTEROSCOPY;  Surgeon: [name], MD;  Location: [facility] ORS;  Service: Gynecology;  Laterality: N/A;    Current Outpatient Prescriptions  Medication Sig Dispense Refill  . albuterol (PROVENTIL HFA;VENTOLIN HFA) 108 (90 BASE) MCG/ACT inhaler Inhale into the lungs every 6 (six) hours as needed for wheezing or shortness of breath.    . anastrozole (ARIMIDEX) 1 MG tablet Take 1 tablet (1 mg total) by mouth daily. 30 tablet 3  . apixaban (ELIQUIS) 5 MG TABS tablet Take 1 tablet (5 mg total) by mouth 2 (two) times daily. 180 tablet 3  . brimonidine (ALPHAGAN) 0.2 % ophthalmic solution Place 1 drop into the left eye 2 (two) times daily.     . Calcium Carbonate-Vit D-Min (CALTRATE 600+D PLUS PO) Take 1 tablet by mouth daily.    . Cholecalciferol (VITAMIN D PO) Take 2,000 Units by mouth daily.     . Coenzyme Q10 (COQ-10) 200 MG CAPS Take 1 capsule by  mouth daily.    . Cranberry 500 MG CAPS Take 500 mg by mouth daily.      Marland Kitchen diltiazem (CARDIZEM CD) 300 MG 24 hr capsule Take 1 capsule (300 mg total) by mouth daily. 90 capsule 3  . fexofenadine (ALLEGRA) 180 MG tablet Take 180 mg by mouth daily as needed for allergies or rhinitis.    . furosemide (LASIX) 20 MG tablet Take 1 tablet (20 mg total) by mouth daily as needed. Take daily as needed for swelling (Patient taking differently: Take 20 mg by mouth daily as needed for fluid or edema (per pt, can take 40 mg if needed.). Take daily as needed for swelling) 90 tablet 3  . LUMIGAN 0.01 % SOLN Place 1 drop into both eyes at bedtime.    . Multiple Vitamin (MULTIVITAMIN) tablet Take 1 tablet by mouth daily.     . Omega-3 Fatty Acids (FISH OIL) 1200 MG CAPS Take 1 capsule by mouth daily.     No  current facility-administered medications for this visit.    Allergies  Allergen Reactions  . Metoprolol     Makes her feel bad  . Reglan [Metoclopramide] Other (See Comments)    Causes severe jitters    History   Social History  . Marital Status: Widowed    Spouse Name: N/A  . Number of Children: N/A  . Years of Education: N/A   Occupational History  . Not on file.   Social History Main Topics  . Smoking status: Former Smoker -- 1.00 packs/day for 2 years    Types: Cigarettes    Start date: 09/17/1962    Quit date: 11/16/1966  . Smokeless tobacco: Never Used  . Alcohol Use: No  . Drug Use: No  . Sexual Activity: Yes    Birth Control/ Protection: Post-menopausal   Other Topics Concern  . Not on file   Social History Narrative   Lives alone in [place]   Retired   Worked previously for [employer] and [employer]    Family History  Problem Relation Age of Onset  . Heart disease Mother   . Stroke Father   . Cancer Father   . Early death Brother   . Cancer Sister   . Cancer Sister   . Cancer Sister   . Diabetes Daughter     ROS- All systems are reviewed and negative except as per the HPI above  Physical Exam: Filed Vitals:   01/26/15 1627  BP: 146/95  Pulse: 79  Height: 5\' 3"  (1.6 m)  Weight: 194 lb 3.2 oz (88.089 kg)    GEN- The patient is well appearing, alert and oriented x 3 today.   Head- normocephalic, atraumatic Eyes-  Sclera clear, conjunctiva pink Ears- hearing intact Oropharynx- clear Neck- supple,  Lungs- Clear to ausculation bilaterally, normal work of breathing Heart- Regular rate and rhythm, no murmurs, rubs or gallops, PMI not laterally displaced GI- soft, NT, ND, + BS Extremities- no clubbing, cyanosis, or edema MS- no significant deformity or atrophy Skin- no rash or lesion Psych- euthymic mood, full affect Neuro- strength and sensation are intact  EKG today reveals sinus rhythm  Echo is reviewed  Assessment and  Plan:  1. 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  She has failed medical therapy with flecainide. Therapeutic strategies for afib including medicine and ablation were discussed in detail with the patient today. Risk, benefits, and alternatives to EP study and radiofrequency ablation  for afib were also discussed in detail today. These risks include but are not limited to stroke, bleeding, vascular damage, tamponade, perforation, damage to the esophagus, lungs, and other structures, pulmonary vein stenosis, worsening renal function, and death. The patient understands these risk and wishes to proceed.  We will therefore proceed with catheter ablation at the next available time.

## 2015-02-02 ENCOUNTER — Institutional Professional Consult (permissible substitution): Payer: Medicare Other | Admitting: Internal Medicine

## 2015-02-03 ENCOUNTER — Ambulatory Visit (HOSPITAL_COMMUNITY): Payer: Medicare Other | Admitting: Physical Therapy

## 2015-02-03 ENCOUNTER — Ambulatory Visit: Payer: Medicare Other | Admitting: Internal Medicine

## 2015-02-03 ENCOUNTER — Encounter (HOSPITAL_COMMUNITY): Payer: Medicare Other | Admitting: Physical Therapy

## 2015-02-03 DIAGNOSIS — I890 Lymphedema, not elsewhere classified: Secondary | ICD-10-CM

## 2015-02-03 DIAGNOSIS — R6889 Other general symptoms and signs: Secondary | ICD-10-CM

## 2015-02-03 DIAGNOSIS — R29898 Other symptoms and signs involving the musculoskeletal system: Secondary | ICD-10-CM

## 2015-02-03 DIAGNOSIS — Z853 Personal history of malignant neoplasm of breast: Secondary | ICD-10-CM

## 2015-02-03 NOTE — Therapy (Signed)
[place], [place], [address] Phone: [phone]   Fax:  [phone]  Physical Therapy Treatment  Patient Details  Name: Heidi Lopez MRN: [id number] Date of Birth: [birth year] Referring Provider:  Baird Cancer, PA-C  Encounter Date: 02/03/2015      PT End of Session - 02/03/15 1423    Visit Number 8   Number of Visits 16   Date for PT Re-Evaluation 02/03/15   Authorization Type Medicare    Authorization Time Period 01/06/15 to 03/08/15   Authorization - Visit Number 8   Authorization - Number of Visits 10   PT Start Time 0935   PT Stop Time 1014   PT Time Calculation (min) 39 min   Activity Tolerance Patient tolerated treatment well   Behavior During Therapy [facility] for tasks assessed/performed      Past Medical History  Diagnosis Date  . Cancer of breast July 2010    s/p XRT 2010 and again in 2011  . Glaucoma   . Cramps, muscle, general     severe breast cramping   . Night sweats   . Pain     breast pain  . Chronic coughing   . Sinus problem   . Wears glasses   . Glaucoma     left eye   . Joint pain     foot and knee pain  . Paroxysmal atrial fibrillation 05/2014  . Dysrhythmia     atrial flutter  . Lymphedema of arm     left  . Osteopenia 01/24/2015    Past Surgical History  Procedure Laterality Date  . Tubal ligation  1984  . Colonoscopy N/A 02/05/2013    Procedure: COLONOSCOPY;  Surgeon: [name], MD;  [facility];  Service: Endoscopy;  Laterality: N/A;  830  . Cardioversion N/A 10/21/2014    Procedure: CARDIOVERSION;  Surgeon: [name], MD;  Location: [facility] ENDOSCOPY;  Service: Cardiovascular;  Laterality: N/A;  . Breast surgery  2010- right and 2011-left    for cancer  . Hysteroscopy w/d&c N/A 12/29/2014    Procedure: DILATATION AND CURETTAGE /HYSTEROSCOPY;  Surgeon: [name], MD;  Location: [facility] ORS;  Service: Gynecology;  Laterality: N/A;    There were  no vitals filed for this visit.  Visit Diagnosis:  History of breast cancer in female  Weakness of shoulder  Decreased functional activity tolerance  Lymphedema      Subjective Assessment - 02/03/15 1422    Subjective Pt states she was measured 2 days ago and should have her garment next week.  States her UE is feeling about the same.  NO pain.   Currently in Pain? No/denies                         [facility] Adult PT Treatment/Exercise - 02/03/15 1423    Manual Therapy   Manual Therapy Manual Lymphatic Drainage (MLD)   Manual Lymphatic Drainage (MLD) Recieved supraclavicular followed by deep and superficial abdominal, Lt axillary-inguinal anastomosis both anteriorly and posteriorly with concentration along subaxillary area.                   PT Short Term Goals - 01/24/15 0940    PT SHORT TERM GOAL #1   Title Patient will improve overall upper extremity strength to at least 4+/5 bilaterally    Time 4   Period Weeks   Status On-going  PT SHORT TERM GOAL #2   Title Patient will be able to verbally state the importance of good postural awareness and will be able to maintain good posture on a consistent basis    Time 4   Period Weeks   Status On-going   PT SHORT TERM GOAL #3   Title Patient will be able to verbalize the importance of and will consistently adhere to positioning techniques when at seated rest and when asleep to assist in managing lymphedema L arm    Time 4   Status On-going   PT SHORT TERM GOAL #4   Title Patient will be independent in correctly and consistently performing appropriate HEP and appropriate lymphedema home management techniques    Time 4   Period Weeks   Status On-going           PT Long Term Goals - 01/24/15 0941    PT LONG TERM GOAL #1   Title Patient will demonstrate 5/5 strength in bilateral upper extremities    Time 8   Period Weeks   Status On-going   PT LONG TERM GOAL #2   Title Patient will demonstrate a  decrease in girth measures L upper extremity by at least 50% and will consistently maintain this decrease in the 2-3 weeks before discharge    Time 8   Status On-going   PT LONG TERM GOAL #3   Title Patient will be independent in home management of lymphedema L arm, including positioning strategies and in activity parameters    Time 8   Status On-going   PT LONG TERM GOAL #4   Title Patient to report increased activity tolerance as evidenced by consistent ability to ambulate at least 2 miles a day with minimal fatigue and no A-fib symptoms    Time 8   Status On-going               Plan - 02/03/15 1424    Clinical Impression Statement Pt given result from last measurement of 20 cc gain in volume as compared to previous measurement.  Explained could be clinician error as measurements were obtained by different therapist. Pt reported her arm overall feels better since beginning therapy.   PT Next Visit Plan continue with manual lymphatic drainage until pt recieves her compression garment.         Problem List Patient Active Problem List   Diagnosis Date Noted  . Atrial flutter 01/31/2015  . Osteopenia 01/24/2015  . Atypical glandular cells of undetermined significance (AGUS) on cervical Pap smear 11/23/2014  . Thickened endometrium 11/23/2014  . Use of tamoxifen (Nolvadex) 11/23/2014  . Snoring 09/20/2014  . A-fib 06/18/2014  . Pain in joint, ankle and foot 10/16/2011  . History of breast cancer, bilateral: Right T1N0 Lumpectomy July 2010, Left T1c, N0 Lumpectomy 04/26/2010. 05/04/2011    Teena Irani, PTA/CLT [phone] 02/03/2015, 2:54 PM  [place] [address] [place], [place], [address] Phone: [phone]   Fax:  [phone]

## 2015-02-07 ENCOUNTER — Ambulatory Visit (HOSPITAL_COMMUNITY): Payer: Medicare Other | Admitting: Physical Therapy

## 2015-02-07 ENCOUNTER — Encounter (HOSPITAL_COMMUNITY): Payer: Medicare Other | Admitting: Physical Therapy

## 2015-02-08 ENCOUNTER — Ambulatory Visit (HOSPITAL_COMMUNITY): Payer: Medicare Other | Admitting: Physical Therapy

## 2015-02-08 DIAGNOSIS — I890 Lymphedema, not elsewhere classified: Secondary | ICD-10-CM

## 2015-02-08 NOTE — Therapy (Signed)
[place], [place], [address] Phone: [phone]   Fax:  [phone]  Physical Therapy Treatment  Patient Details  Name: Heidi Lopez MRN: [id number] Date of Birth: 08-27-1946 Referring Provider:  Baird Cancer, PA-C  Encounter Date: 02/08/2015      PT End of Session - 02/08/15 1223    Visit Number 9   Number of Visits 10   Date for PT Re-Evaluation 02/03/15   Authorization Type Medicare    Authorization - Visit Number 9   Authorization - Number of Visits 10   PT Start Time 0848   PT Stop Time 0930   PT Time Calculation (min) 42 min      Past Medical History  Diagnosis Date  . Cancer of breast July 2010    s/p XRT 2010 and again in 2011  . Glaucoma   . Cramps, muscle, general     severe breast cramping   . Night sweats   . Pain     breast pain  . Chronic coughing   . Sinus problem   . Wears glasses   . Glaucoma     left eye   . Joint pain     foot and knee pain  . Paroxysmal atrial fibrillation 05/2014  . Dysrhythmia     atrial flutter  . Lymphedema of arm     left  . Osteopenia 01/24/2015    Past Surgical History  Procedure Laterality Date  . Tubal ligation  1984  . Colonoscopy N/A 02/05/2013    Procedure: COLONOSCOPY;  Surgeon: [name], MD;  [facility];  Service: Endoscopy;  Laterality: N/A;  830  . Cardioversion N/A 10/21/2014    Procedure: CARDIOVERSION;  Surgeon: [name], MD;  Location: [facility] ENDOSCOPY;  Service: Cardiovascular;  Laterality: N/A;  . Breast surgery  2010- right and 2011-left    for cancer  . Hysteroscopy w/d&c N/A 12/29/2014    Procedure: DILATATION AND CURETTAGE /HYSTEROSCOPY;  Surgeon: [name], MD;  Location: [facility] ORS;  Service: Gynecology;  Laterality: N/A;    There were no vitals filed for this visit.  Visit Diagnosis:  Lymphedema      Subjective Assessment - 02/08/15 1222    Subjective Pt states the only swelling she notices  is in her upper arm now.  Pt states she expected her garment yesterday but they did not call.    Currently in Pain? No/denies               [facility] Adult PT Treatment/Exercise - 02/08/15 0001    Manual Therapy   Manual Therapy Manual Lymphatic Drainage (MLD)   Manual Lymphatic Drainage (MLD) Recieved supraclavicular followed by deep and superficial abdominal, Lt axillary-inguinal anastomosis both anteriorly and posteriorly with concentration along subaxillary area.               PT Short Term Goals - 01/24/15 0940    PT SHORT TERM GOAL #1   Title Patient will improve overall upper extremity strength to at least 4+/5 bilaterally    Time 4   Period Weeks   Status On-going   PT SHORT TERM GOAL #2   Title Patient will be able to verbally state the importance of good postural awareness and will be able to maintain good posture on a consistent basis    Time 4   Period Weeks   Status On-going   PT SHORT TERM GOAL #3  Title Patient will be able to verbalize the importance of and will consistently adhere to positioning techniques when at seated rest and when asleep to assist in managing lymphedema L arm    Time 4   Status On-going   PT SHORT TERM GOAL #4   Title Patient will be independent in correctly and consistently performing appropriate HEP and appropriate lymphedema home management techniques    Time 4   Period Weeks   Status On-going           PT Long Term Goals - 01/24/15 0941    PT LONG TERM GOAL #1   Title Patient will demonstrate 5/5 strength in bilateral upper extremities    Time 8   Period Weeks   Status On-going   PT LONG TERM GOAL #2   Title Patient will demonstrate a decrease in girth measures L upper extremity by at least 50% and will consistently maintain this decrease in the 2-3 weeks before discharge    Time 8   Status On-going   PT LONG TERM GOAL #3   Title Patient will be independent in home management of lymphedema L arm, including positioning  strategies and in activity parameters    Time 8   Status On-going   PT LONG TERM GOAL #4   Title Patient to report increased activity tolerance as evidenced by consistent ability to ambulate at least 2 miles a day with minimal fatigue and no A-fib symptoms    Time 8   Status On-going               Plan - 02/08/15 1223    Clinical Impression Statement Pt anticipates recieving garment in today.  Pt will come for one more treatment to get measured and to answer any further quesions pt may have on lymphedema, self massaging .    PT Next Visit Plan discharge pt next visit for maintainace program.         Problem List Patient Active Problem List   Diagnosis Date Noted  . Atrial flutter 01/31/2015  . Osteopenia 01/24/2015  . Atypical glandular cells of undetermined significance (AGUS) on cervical Pap smear 11/23/2014  . Thickened endometrium 11/23/2014  . Use of tamoxifen (Nolvadex) 11/23/2014  . Snoring 09/20/2014  . A-fib 06/18/2014  . Pain in joint, ankle and foot 10/16/2011  . History of breast cancer, bilateral: Right T1N0 Lumpectomy July 2010, Left T1c, N0 Lumpectomy 04/26/2010. 05/04/2011   Rayetta Humphrey, PT CLT [phone] 02/08/2015, 12:26 PM  [place] [address] [place], [place], [address] Phone: [phone]   Fax:  [phone]

## 2015-02-10 ENCOUNTER — Encounter (HOSPITAL_COMMUNITY): Payer: Medicare Other | Admitting: Physical Therapy

## 2015-02-10 ENCOUNTER — Ambulatory Visit (HOSPITAL_COMMUNITY): Payer: Medicare Other | Admitting: Physical Therapy

## 2015-02-10 DIAGNOSIS — R6889 Other general symptoms and signs: Secondary | ICD-10-CM

## 2015-02-10 DIAGNOSIS — Z853 Personal history of malignant neoplasm of breast: Secondary | ICD-10-CM

## 2015-02-10 DIAGNOSIS — I890 Lymphedema, not elsewhere classified: Secondary | ICD-10-CM | POA: Diagnosis not present

## 2015-02-10 DIAGNOSIS — R29898 Other symptoms and signs involving the musculoskeletal system: Secondary | ICD-10-CM

## 2015-02-10 NOTE — Therapy (Signed)
[place], [place], [address] Phone: [phone]   Fax:  [phone]  Physical Therapy Lymphedema Treatment / Re-evaluation  Patient Details  Name: Heidi Lopez MRN: [id number] Date of Birth: 02-25-1946 Referring Provider:  Baird Cancer, PA-C  Encounter Date: 02/10/2015      PT End of Session - 02/10/15 1501    Visit Number 10   Number of Visits 10   Date for PT Re-Evaluation 02/03/15   Authorization Type Medicare    Authorization - Visit Number 10   Authorization - Number of Visits 10   PT Start Time 3646   PT Stop Time 1440   PT Time Calculation (min) 45 min   Activity Tolerance Patient tolerated treatment well   Behavior During Therapy [facility] for tasks assessed/performed      Past Medical History  Diagnosis Date  . Cancer of breast July 2010    s/p XRT 2010 and again in 2011  . Glaucoma   . Cramps, muscle, general     severe breast cramping   . Night sweats   . Pain     breast pain  . Chronic coughing   . Sinus problem   . Wears glasses   . Glaucoma     left eye   . Joint pain     foot and knee pain  . Paroxysmal atrial fibrillation 05/2014  . Dysrhythmia     atrial flutter  . Lymphedema of arm     left  . Osteopenia 01/24/2015    Past Surgical History  Procedure Laterality Date  . Tubal ligation  1984  . Colonoscopy N/A 02/05/2013    Procedure: COLONOSCOPY;  Surgeon: [name], MD;  Location: [facility];  Service: Endoscopy;  Laterality: N/A;  830  . Cardioversion N/A 10/21/2014    Procedure: CARDIOVERSION;  Surgeon: [name], MD;  Location: [facility] ENDOSCOPY;  Service: Cardiovascular;  Laterality: N/A;  . Breast surgery  2010- right and 2011-left    for cancer  . Hysteroscopy w/d&c N/A 12/29/2014    Procedure: DILATATION AND CURETTAGE /HYSTEROSCOPY;  Surgeon: [name], MD;  Location: [facility] ORS;  Service: Gynecology;  Laterality: N/A;    There were no vitals filed for  this visit.  Visit Diagnosis:  Lymphedema  History of breast cancer in female  Weakness of shoulder  Decreased functional activity tolerance      Subjective Assessment - 02/10/15 1500    Subjective pt states she is surprised she hasn't received her compression garment yet but should be anyday now. PT reports no pain or difficulty.   Currently in Pain? No/denies                         [facility] Adult PT Treatment/Exercise - 02/10/15 0001    Manual Therapy   Manual Therapy Manual Lymphatic Drainage (MLD)   Manual Lymphatic Drainage (MLD) Received supraclavicular followed by deep and superficial abdominal, Lt axillary-inguinal anastomosis both anteriorly and posteriorly with concentration along subaxillary area.                   PT Short Term Goals - 02/10/15 1504    PT SHORT TERM GOAL #1   Title Patient will improve overall upper extremity strength to at least 4+/5 bilaterally    Time 4   Period Weeks   Status Achieved   PT SHORT TERM GOAL #2   Title Patient  will be able to verbally state the importance of good postural awareness and will be able to maintain good posture on a consistent basis    Time 4   Period Weeks   Status Achieved   PT SHORT TERM GOAL #3   Title Patient will be able to verbalize the importance of and will consistently adhere to positioning techniques when at seated rest and when asleep to assist in managing lymphedema L arm    Time 4   Period Weeks   Status Achieved   PT SHORT TERM GOAL #4   Title Patient will be independent in correctly and consistently performing appropriate HEP and appropriate lymphedema home management techniques    Time 4   Period Weeks   Status Achieved           PT Long Term Goals - 08-Mar-2015 1505    PT LONG TERM GOAL #1   Title Patient will demonstrate 5/5 strength in bilateral upper extremities    Time 8   Period Weeks   Status Achieved   PT LONG TERM GOAL #2   Title Patient will demonstrate  a decrease in girth measures L upper extremity by at least 50% and will consistently maintain this decrease in the 2-3 weeks before discharge    Time 8   Period Weeks   Status Partially Met   PT LONG TERM GOAL #3   Title Patient will be independent in home management of lymphedema L arm, including positioning strategies and in activity parameters    Time 8   Period Weeks   Status Achieved   PT LONG TERM GOAL #4   Title Patient to report increased activity tolerance as evidenced by consistent ability to ambulate at least 2 miles a day with minimal fatigue and no A-fib symptoms    Time 8   Period Weeks   Status Partially Met               Plan - 03-08-15 1502    Clinical Impression Statement Lt UE remeasured today with overall reduction of approximately 60 cc fluid.  PT has met goals and will be discharged to home maintenance at this point per evaluating therapist POC.  PT without questions or concerns at this pointt.  Instructed to continue self massage and daily wear of UE garment when she receives it.  Pt verbalized understanding.   PT Next Visit Plan discharge to home maintainance program.        Problem List Patient Active Problem List   Diagnosis Date Noted  . Atrial flutter 01/31/2015  . Osteopenia 01/24/2015  . Atypical glandular cells of undetermined significance (AGUS) on cervical Pap smear 11/23/2014  . Thickened endometrium 11/23/2014  . Use of tamoxifen (Nolvadex) 11/23/2014  . Snoring 09/20/2014  . A-fib 06/18/2014  . Pain in joint, ankle and foot 10/16/2011  . History of breast cancer, bilateral: Right T1N0 Lumpectomy July 2010, Left T1c, N0 Lumpectomy 04/26/2010. 05/04/2011    Lurena Nida, PTA/CLT [phone]  03-08-15, 3:55 PM       G-Codes - 2015/03/08 1555    Functional Assessment Tool Used Skilled clinical assessment based on girth measures, strength, reported functional activity tolerance    Functional Limitation Other PT primary   Other PT  Primary Goal Status (H8808) At least 1 percent but less than 20 percent impaired, limited or restricted   Other PT Primary Discharge Status (L9527) At least 1 percent but less than 20 percent impaired, limited or restricted  PHYSICAL THERAPY DISCHARGE SUMMARY  Visits from Start of Care: 10  Current functional level related to goals / functional outcomes: Patient has met all goals and has verbalized understanding of home maintenance program to manage remaining fluid in her UE. Patient states she should be receiving her compression garment at any time now.    Remaining deficits: Residual fluid in UE   Education / Equipment: Home management program, use of compression garment  Plan: Patient agrees to discharge.  Patient goals were met. Patient is being discharged due to meeting the stated rehab goals.  ?????       Deniece Ree PT, DPT [facility] [address] [place], [place], [address] Phone: [phone]   Fax:  [phone]

## 2015-02-15 ENCOUNTER — Ambulatory Visit (HOSPITAL_COMMUNITY)
Admission: RE | Admit: 2015-02-15 | Discharge: 2015-02-15 | Disposition: A | Discharge status: Home / Self Care | Payer: Medicare Other | Source: Ambulatory Visit | Attending: Oncology | Admitting: Oncology

## 2015-02-15 DIAGNOSIS — Z79899 Other long term (current) drug therapy: Secondary | ICD-10-CM | POA: Insufficient documentation

## 2015-02-15 DIAGNOSIS — M8580 Other specified disorders of bone density and structure, unspecified site: Secondary | ICD-10-CM | POA: Insufficient documentation

## 2015-02-15 DIAGNOSIS — Z780 Asymptomatic menopausal state: Secondary | ICD-10-CM | POA: Insufficient documentation

## 2015-02-17 ENCOUNTER — Other Ambulatory Visit (INDEPENDENT_AMBULATORY_CARE_PROVIDER_SITE_OTHER): Payer: Medicare Other | Admitting: *Deleted

## 2015-02-17 ENCOUNTER — Telehealth: Payer: Self-pay | Admitting: Internal Medicine

## 2015-02-17 DIAGNOSIS — I480 Paroxysmal atrial fibrillation: Secondary | ICD-10-CM | POA: Diagnosis not present

## 2015-02-17 LAB — CBC WITH DIFFERENTIAL/PLATELET
BASOS ABS: 0.1 10*3/uL (ref 0.0–0.1)
Basophils Relative: 0.7 % (ref 0.0–3.0)
Eosinophils Absolute: 0.2 10*3/uL (ref 0.0–0.7)
Eosinophils Relative: 2.3 % (ref 0.0–5.0)
HCT: 40.6 % (ref 36.0–46.0)
Hemoglobin: 13.5 g/dL (ref 12.0–15.0)
LYMPHS ABS: 2.5 10*3/uL (ref 0.7–4.0)
Lymphocytes Relative: 32.8 % (ref 12.0–46.0)
MCHC: 33.3 g/dL (ref 30.0–36.0)
MCV: 92.5 fl (ref 78.0–100.0)
Monocytes Absolute: 0.8 10*3/uL (ref 0.1–1.0)
Monocytes Relative: 11.1 % (ref 3.0–12.0)
NEUTROS PCT: 53.1 % (ref 43.0–77.0)
Neutro Abs: 4 10*3/uL (ref 1.4–7.7)
PLATELETS: 185 10*3/uL (ref 150.0–400.0)
RBC: 4.39 Mil/uL (ref 3.87–5.11)
RDW: 13.7 % (ref 11.5–15.5)
WBC: 7.5 10*3/uL (ref 4.0–10.5)

## 2015-02-17 LAB — BASIC METABOLIC PANEL
BUN: 9 mg/dL (ref 6–23)
CALCIUM: 9 mg/dL (ref 8.4–10.5)
CHLORIDE: 107 meq/L (ref 96–112)
CO2: 25 mEq/L (ref 19–32)
Creatinine, Ser: 0.75 mg/dL (ref 0.40–1.20)
GFR: 81.26 mL/min (ref >60.00)
Glucose, Bld: 91 mg/dL (ref 70–99)
Potassium: 4.2 mEq/L (ref 3.5–5.1)
Sodium: 138 mEq/L (ref 135–145)

## 2015-02-17 NOTE — Telephone Encounter (Signed)
New Message      Pt calling with some questions about upcoming procedure on 02/24/15. Pt wants to know if she is supposed to take her Eliquis the morning of? What time she needs to arrive? And what is a TEE? Please call back and advise.

## 2015-02-17 NOTE — Telephone Encounter (Signed)
Left message for patient to call me back. 

## 2015-02-18 NOTE — Telephone Encounter (Signed)
Spoke with the patient and she is aware of instructions and aware to not take any medications the morniong of procedure

## 2015-02-23 MED ORDER — SODIUM CHLORIDE 0.9 % IV SOLN: INTRAVENOUS | Status: DC

## 2015-02-24 ENCOUNTER — Ambulatory Visit (HOSPITAL_COMMUNITY): Payer: Medicare Other | Admitting: Anesthesiology

## 2015-02-24 ENCOUNTER — Encounter (HOSPITAL_COMMUNITY): Payer: Self-pay | Admitting: *Deleted

## 2015-02-24 ENCOUNTER — Ambulatory Visit (HOSPITAL_COMMUNITY)
Admission: RE | Admit: 2015-02-24 | Discharge: 2015-02-24 | Disposition: A | Discharge status: Home / Self Care | Payer: Medicare Other | Source: Ambulatory Visit | Attending: Nurse Practitioner | Admitting: Nurse Practitioner

## 2015-02-24 ENCOUNTER — Encounter (HOSPITAL_COMMUNITY)
Admission: RE | Disposition: A | Discharge status: Home / Self Care | Payer: Self-pay | Source: Ambulatory Visit | Attending: Internal Medicine

## 2015-02-24 ENCOUNTER — Ambulatory Visit (HOSPITAL_COMMUNITY)
Admission: RE | Admit: 2015-02-24 | Discharge: 2015-02-25 | Disposition: A | Discharge status: Home / Self Care | Payer: Medicare Other | Source: Ambulatory Visit | Attending: Internal Medicine | Admitting: Internal Medicine

## 2015-02-24 DIAGNOSIS — I483 Typical atrial flutter: Secondary | ICD-10-CM | POA: Diagnosis not present

## 2015-02-24 DIAGNOSIS — I4892 Unspecified atrial flutter: Secondary | ICD-10-CM | POA: Diagnosis present

## 2015-02-24 DIAGNOSIS — Z87891 Personal history of nicotine dependence: Secondary | ICD-10-CM | POA: Diagnosis not present

## 2015-02-24 DIAGNOSIS — I480 Paroxysmal atrial fibrillation: Secondary | ICD-10-CM | POA: Insufficient documentation

## 2015-02-24 DIAGNOSIS — Z853 Personal history of malignant neoplasm of breast: Secondary | ICD-10-CM | POA: Insufficient documentation

## 2015-02-24 DIAGNOSIS — I4891 Unspecified atrial fibrillation: Secondary | ICD-10-CM | POA: Diagnosis present

## 2015-02-24 HISTORY — PX: ELECTROPHYSIOLOGIC STUDY: SHX172A

## 2015-02-24 HISTORY — PX: TEE WITHOUT CARDIOVERSION: SHX5443

## 2015-02-24 LAB — POCT ACTIVATED CLOTTING TIME
ACTIVATED CLOTTING TIME: 276 s
ACTIVATED CLOTTING TIME: 159 s
Activated Clotting Time: 294 seconds
Activated Clotting Time: 300 seconds

## 2015-02-24 LAB — MRSA PCR SCREENING: MRSA by PCR: NEGATIVE

## 2015-02-24 SURGERY — ATRIAL FIBRILLATION ABLATION
Anesthesia: General

## 2015-02-24 SURGERY — ECHOCARDIOGRAM, TRANSESOPHAGEAL
Anesthesia: Moderate Sedation

## 2015-02-24 MED ORDER — HEPARIN SODIUM (PORCINE) 1000 UNIT/ML IJ SOLN
INTRAMUSCULAR | Status: DC | PRN
  Administered 2015-02-24: 3000 [IU] via INTRAVENOUS
  Administered 2015-02-24: 2000 [IU] via INTRAVENOUS

## 2015-02-24 MED ORDER — MIDAZOLAM HCL 5 MG/5ML IJ SOLN
INTRAMUSCULAR | Status: DC | PRN
  Administered 2015-02-24: 2 mg via INTRAVENOUS

## 2015-02-24 MED ORDER — SODIUM CHLORIDE 0.9 % IJ SOLN: 3.0000 mL | INTRAMUSCULAR | Status: DC | PRN

## 2015-02-24 MED ORDER — HYDROMORPHONE HCL 1 MG/ML IJ SOLN: 0.2500 mg | INTRAMUSCULAR | Status: DC | PRN

## 2015-02-24 MED ORDER — SODIUM CHLORIDE 0.9 % IJ SOLN
3.0000 mL | Freq: Two times a day (BID) | INTRAMUSCULAR | Status: DC
  Administered 2015-02-24: 3 mL via INTRAVENOUS

## 2015-02-24 MED ORDER — DOBUTAMINE-DEXTROSE 2-5 MG/ML-% IV SOLN
INTRAVENOUS | Status: DC | PRN
  Administered 2015-02-24: 20 ug/kg/min via INTRAVENOUS

## 2015-02-24 MED ORDER — MIDAZOLAM HCL 5 MG/ML IJ SOLN
INTRAMUSCULAR | Status: AC
  Filled 2015-02-24: qty 2

## 2015-02-24 MED ORDER — ANASTROZOLE 1 MG PO TABS
1.0000 mg | ORAL_TABLET | Freq: Every day | ORAL | Status: DC
  Administered 2015-02-24 – 2015-02-25 (×2): 1 mg via ORAL
  Filled 2015-02-24 (×3): qty 1

## 2015-02-24 MED ORDER — PROTAMINE SULFATE 10 MG/ML IV SOLN
INTRAVENOUS | Status: DC | PRN
  Administered 2015-02-24: 30 mg via INTRAVENOUS

## 2015-02-24 MED ORDER — LACTATED RINGERS IV SOLN
INTRAVENOUS | Status: DC | PRN
  Administered 2015-02-24: 11:00:00 via INTRAVENOUS

## 2015-02-24 MED ORDER — HEPARIN SODIUM (PORCINE) 1000 UNIT/ML IJ SOLN
INTRAMUSCULAR | Status: AC
  Filled 2015-02-24: qty 1

## 2015-02-24 MED ORDER — SODIUM CHLORIDE 0.9 % IV SOLN
INTRAVENOUS | Status: DC | PRN
  Administered 2015-02-24: 15:00:00 via INTRAVENOUS

## 2015-02-24 MED ORDER — DOBUTAMINE IN D5W 4-5 MG/ML-% IV SOLN
INTRAVENOUS | Status: AC
  Filled 2015-02-24: qty 250

## 2015-02-24 MED ORDER — PROMETHAZINE HCL 25 MG/ML IJ SOLN: 6.2500 mg | INTRAMUSCULAR | Status: DC | PRN

## 2015-02-24 MED ORDER — DIPHENHYDRAMINE HCL 50 MG/ML IJ SOLN
INTRAMUSCULAR | Status: AC
  Filled 2015-02-24: qty 1

## 2015-02-24 MED ORDER — FENTANYL CITRATE (PF) 250 MCG/5ML IJ SOLN
INTRAMUSCULAR | Status: AC
  Filled 2015-02-24: qty 5

## 2015-02-24 MED ORDER — ONDANSETRON HCL 4 MG/2ML IJ SOLN: 4.0000 mg | Freq: Four times a day (QID) | INTRAMUSCULAR | Status: DC | PRN

## 2015-02-24 MED ORDER — IOHEXOL 350 MG/ML SOLN
INTRAVENOUS | Status: DC | PRN
  Administered 2015-02-24: 100 mL via INTRACARDIAC

## 2015-02-24 MED ORDER — MIDAZOLAM HCL 10 MG/2ML IJ SOLN
INTRAMUSCULAR | Status: DC | PRN
  Administered 2015-02-24 (×3): 1 mg via INTRAVENOUS
  Administered 2015-02-24 (×2): 2 mg via INTRAVENOUS

## 2015-02-24 MED ORDER — FENTANYL CITRATE (PF) 100 MCG/2ML IJ SOLN
INTRAMUSCULAR | Status: DC | PRN
  Administered 2015-02-24: 25 ug via INTRAVENOUS

## 2015-02-24 MED ORDER — APIXABAN 5 MG PO TABS
5.0000 mg | ORAL_TABLET | Freq: Two times a day (BID) | ORAL | Status: DC
  Administered 2015-02-24 – 2015-02-25 (×2): 5 mg via ORAL
  Filled 2015-02-24 (×3): qty 1

## 2015-02-24 MED ORDER — SODIUM CHLORIDE 0.9 % IV SOLN
INTRAVENOUS | Status: DC
  Administered 2015-02-24: 500 mL via INTRAVENOUS

## 2015-02-24 MED ORDER — FENTANYL CITRATE (PF) 100 MCG/2ML IJ SOLN
INTRAMUSCULAR | Status: AC
  Filled 2015-02-24: qty 2

## 2015-02-24 MED ORDER — BUPIVACAINE HCL (PF) 0.25 % IJ SOLN
INTRAMUSCULAR | Status: AC
  Filled 2015-02-24: qty 30

## 2015-02-24 MED ORDER — ONE-DAILY MULTI VITAMINS PO TABS
1.0000 | ORAL_TABLET | Freq: Every day | ORAL | Status: DC
  Administered 2015-02-24 – 2015-02-25 (×2): 1 via ORAL
  Filled 2015-02-24 (×2): qty 1

## 2015-02-24 MED ORDER — LIDOCAINE HCL (CARDIAC) 20 MG/ML IV SOLN
INTRAVENOUS | Status: DC | PRN
  Administered 2015-02-24: 50 mg via INTRAVENOUS

## 2015-02-24 MED ORDER — FENTANYL CITRATE (PF) 100 MCG/2ML IJ SOLN
INTRAMUSCULAR | Status: DC | PRN
  Administered 2015-02-24: 12.5 ug via INTRAVENOUS
  Administered 2015-02-24: 25 ug via INTRAVENOUS
  Administered 2015-02-24: 12.5 ug via INTRAVENOUS

## 2015-02-24 MED ORDER — MIDAZOLAM HCL 2 MG/2ML IJ SOLN
INTRAMUSCULAR | Status: AC
  Filled 2015-02-24: qty 2

## 2015-02-24 MED ORDER — BRIMONIDINE TARTRATE 0.2 % OP SOLN
1.0000 [drp] | Freq: Two times a day (BID) | OPHTHALMIC | Status: DC
Start: 2015-02-24 — End: 2015-02-25
  Administered 2015-02-24: 1 [drp] via OPHTHALMIC
  Filled 2015-02-24: qty 5

## 2015-02-24 MED ORDER — LIDOCAINE VISCOUS 2 % MT SOLN
OROMUCOSAL | Status: AC
  Filled 2015-02-24: qty 15

## 2015-02-24 MED ORDER — LATANOPROST 0.005 % OP SOLN
1.0000 [drp] | Freq: Every day | OPHTHALMIC | Status: DC
  Administered 2015-02-24: 1 [drp] via OPHTHALMIC
  Filled 2015-02-24: qty 2.5

## 2015-02-24 MED ORDER — ONDANSETRON HCL 4 MG/2ML IJ SOLN
INTRAMUSCULAR | Status: DC | PRN
  Administered 2015-02-24: 4 mg via INTRAVENOUS

## 2015-02-24 MED ORDER — HYDROCODONE-ACETAMINOPHEN 5-325 MG PO TABS
1.0000 | ORAL_TABLET | ORAL | Status: DC | PRN
  Administered 2015-02-25 (×2): 1 via ORAL
  Filled 2015-02-24 (×2): qty 1

## 2015-02-24 MED ORDER — ALBUTEROL SULFATE (2.5 MG/3ML) 0.083% IN NEBU: 2.5000 mg | INHALATION_SOLUTION | Freq: Four times a day (QID) | RESPIRATORY_TRACT | Status: DC | PRN

## 2015-02-24 MED ORDER — ACETAMINOPHEN 325 MG PO TABS: 650.0000 mg | ORAL_TABLET | ORAL | Status: DC | PRN

## 2015-02-24 MED ORDER — SODIUM CHLORIDE 0.9 % IV SOLN: 250.0000 mL | INTRAVENOUS | Status: DC | PRN

## 2015-02-24 MED ORDER — LORATADINE 10 MG PO TABS
10.0000 mg | ORAL_TABLET | Freq: Every day | ORAL | Status: DC
  Administered 2015-02-24 – 2015-02-25 (×2): 10 mg via ORAL
  Filled 2015-02-24 (×2): qty 1

## 2015-02-24 MED ORDER — PROPOFOL 10 MG/ML IV BOLUS
INTRAVENOUS | Status: DC | PRN
  Administered 2015-02-24: 50 mg via INTRAVENOUS
  Administered 2015-02-24: 150 mg via INTRAVENOUS

## 2015-02-24 MED ORDER — ALBUTEROL SULFATE HFA 108 (90 BASE) MCG/ACT IN AERS: 2.0000 | INHALATION_SPRAY | Freq: Four times a day (QID) | RESPIRATORY_TRACT | Status: DC | PRN

## 2015-02-24 SURGICAL SUPPLY — 22 items
BAG SNAP BAND KOVER 36X36 (MISCELLANEOUS) ×3 IMPLANT
BLANKET WARM UNDERBOD FULL ACC (MISCELLANEOUS) ×3 IMPLANT
CATH DIAG 6FR PIGTAIL (CATHETERS) ×3 IMPLANT
CATH FIXED LASSO 20POLE D 20MM (CATHETERS) ×2 IMPLANT
CATH NAVISTAR SMARTTOUCH DF (ABLATOR) ×3 IMPLANT
CATH SOUNDSTAR 3D IMAGING (CATHETERS) ×3 IMPLANT
CATH WEBSTER BI DIR CS D-F CRV (CATHETERS) ×3 IMPLANT
COVER SWIFTLINK CONNECTOR (BAG) ×3 IMPLANT
NDL TRANSEP BRK 71CM 407200 (NEEDLE) ×1 IMPLANT
NEEDLE TRANSEP BRK 71CM 407200 (NEEDLE) ×3 IMPLANT
PACK EP LATEX FREE (CUSTOM PROCEDURE TRAY) ×3
PACK EP LF (CUSTOM PROCEDURE TRAY) ×1 IMPLANT
PAD DEFIB LIFELINK (PAD) ×3 IMPLANT
PATCH CARTO3 (PAD) ×3 IMPLANT
SHEATH AVANTI 11F 11CM (SHEATH) ×3 IMPLANT
SHEATH PINNACLE 7F 10CM (SHEATH) ×5 IMPLANT
SHEATH PINNACLE 9F 10CM (SHEATH) ×3 IMPLANT
SHEATH SWARTZ TS SL2 63CM 8.5F (SHEATH) ×3 IMPLANT
SHIELD RADPAD SCOOP 12X17 (MISCELLANEOUS) ×3 IMPLANT
SYR MEDRAD MARK V 150ML (SYRINGE) ×3 IMPLANT
TUBING CONTRAST HIGH PRESS 48 (TUBING) ×3 IMPLANT
TUBING SMART ABLATE COOLFLOW (TUBING) ×3 IMPLANT

## 2015-02-24 NOTE — Progress Notes (Signed)
Site area: rt groin Site Prior to Removal:  Level 0 Pressure Applied For: 15 minutes Manual:   yes Patient Status During Pull:  stable Post Pull Site:  Level  0 Post Pull Instructions Given:  yes Post Pull Pulses Present: yes Dressing Applied:  Small tegaderm Bedrest begins @  7680 Comments:

## 2015-02-24 NOTE — Interval H&P Note (Signed)
History and Physical Interval Note:  02/24/2015 8:02 AM  Heidi Lopez  has presented today for surgery, with the diagnosis of AFIB  The various methods of treatment have been discussed with the patient and family. After consideration of risks, benefits and other options for treatment, the patient has consented to  Procedure(s): TRANSESOPHAGEAL ECHOCARDIOGRAM (TEE) (N/A) as a surgical intervention .  The patient's history has been reviewed, patient examined, no change in status, stable for surgery.  I have reviewed the patient's chart and labs.  Questions were answered to the patient's satisfaction.     Dorris Carnes

## 2015-02-24 NOTE — H&P (View-Only) (Signed)
Primary Care Physician: Heidi Noble, MD Referring Physician:  Dr Doug Sou is a 69 y.o. female with a h/o recently diagnosed atrial fibrillation who presents for EP consultation.  She has a h/o breast CA and underwent XRT in 2010 and again in 2011.  She reports that her symptoms of AF began in 2011 after her XRT.  She has tachypalpitations which are more prominent at night.  Episodes typically last several minutes at a time.  She has episodes most evenings.  She has been evaluated by Dr Harl Bowie and found to have afib with RVR as the cause.  She was been placed on diltiazem.  Dose titration has been limited by edema.  She is also on digoxin.  She has failed medical therapy with flecainide also. Her chads2vasc score is at least 2.  She is anticoagulated with eliquis.  She is tolerating this without difficulty.  She is unaware of triggers or precipitants for her afib.     Today, she denies symptoms of  chest pain,   orthopnea, PND, lower extremity edema, dizziness, presyncope, syncope, or neurologic sequela.  She has frequent URI symptoms with associated SOB.  The patient is tolerating medications without difficulties and is otherwise without complaint today.   Past Medical History  Diagnosis Date  . Cancer of breast July 2010    s/p XRT 2010 and again in 2011  . Glaucoma   . Cramps, muscle, general     severe breast cramping   . Night sweats   . Pain     breast pain  . Chronic coughing   . Sinus problem   . Wears glasses   . Glaucoma     left eye   . Joint pain     foot and knee pain  . Paroxysmal atrial fibrillation 05/2014  . Dysrhythmia     atrial flutter  . Lymphedema of arm     left  . Osteopenia 01/24/2015   Past Surgical History  Procedure Laterality Date  . Tubal ligation  1984  . Colonoscopy N/A 02/05/2013    Procedure: COLONOSCOPY;  Surgeon: [name], MD;  Location: [facility];  Service: Endoscopy;  Laterality: N/A;  830  . Cardioversion N/A 10/21/2014      Procedure: CARDIOVERSION;  Surgeon: [name], MD;  Location: [facility] ENDOSCOPY;  Service: Cardiovascular;  Laterality: N/A;  . Breast surgery  2010- right and 2011-left    for cancer  . Hysteroscopy w/d&c N/A 12/29/2014    Procedure: DILATATION AND CURETTAGE /HYSTEROSCOPY;  Surgeon: [name], MD;  Location: [facility] ORS;  Service: Gynecology;  Laterality: N/A;    Current Outpatient Prescriptions  Medication Sig Dispense Refill  . albuterol (PROVENTIL HFA;VENTOLIN HFA) 108 (90 BASE) MCG/ACT inhaler Inhale into the lungs every 6 (six) hours as needed for wheezing or shortness of breath.    . anastrozole (ARIMIDEX) 1 MG tablet Take 1 tablet (1 mg total) by mouth daily. 30 tablet 3  . apixaban (ELIQUIS) 5 MG TABS tablet Take 1 tablet (5 mg total) by mouth 2 (two) times daily. 180 tablet 3  . brimonidine (ALPHAGAN) 0.2 % ophthalmic solution Place 1 drop into the left eye 2 (two) times daily.     . Calcium Carbonate-Vit D-Min (CALTRATE 600+D PLUS PO) Take 1 tablet by mouth daily.    . Cholecalciferol (VITAMIN D PO) Take 2,000 Units by mouth daily.     . Coenzyme Q10 (COQ-10) 200 MG CAPS Take 1 capsule by  mouth daily.    . Cranberry 500 MG CAPS Take 500 mg by mouth daily.      Marland Kitchen diltiazem (CARDIZEM CD) 300 MG 24 hr capsule Take 1 capsule (300 mg total) by mouth daily. 90 capsule 3  . fexofenadine (ALLEGRA) 180 MG tablet Take 180 mg by mouth daily as needed for allergies or rhinitis.    . furosemide (LASIX) 20 MG tablet Take 1 tablet (20 mg total) by mouth daily as needed. Take daily as needed for swelling (Patient taking differently: Take 20 mg by mouth daily as needed for fluid or edema (per pt, can take 40 mg if needed.). Take daily as needed for swelling) 90 tablet 3  . LUMIGAN 0.01 % SOLN Place 1 drop into both eyes at bedtime.    . Multiple Vitamin (MULTIVITAMIN) tablet Take 1 tablet by mouth daily.     . Omega-3 Fatty Acids (FISH OIL) 1200 MG CAPS Take 1 capsule by mouth daily.     No  current facility-administered medications for this visit.    Allergies  Allergen Reactions  . Metoprolol     Makes her feel bad  . Reglan [Metoclopramide] Other (See Comments)    Causes severe jitters    History   Social History  . Marital Status: Widowed    Spouse Name: N/A  . Number of Children: N/A  . Years of Education: N/A   Occupational History  . Not on file.   Social History Main Topics  . Smoking status: Former Smoker -- 1.00 packs/day for 2 years    Types: Cigarettes    Start date: 09/17/1962    Quit date: 11/16/1966  . Smokeless tobacco: Never Used  . Alcohol Use: No  . Drug Use: No  . Sexual Activity: Yes    Birth Control/ Protection: Post-menopausal   Other Topics Concern  . Not on file   Social History Narrative   Lives alone in [place]   Retired   Worked previously for [employer] and [employer]    Family History  Problem Relation Age of Onset  . Heart disease Mother   . Stroke Father   . Cancer Father   . Early death Brother   . Cancer Sister   . Cancer Sister   . Cancer Sister   . Diabetes Daughter     ROS- All systems are reviewed and negative except as per the HPI above  Physical Exam: Filed Vitals:   01/26/15 1627  BP: 146/95  Pulse: 79  Height: 5\' 3"  (1.6 m)  Weight: 194 lb 3.2 oz (88.089 kg)    GEN- The patient is well appearing, alert and oriented x 3 today.   Head- normocephalic, atraumatic Eyes-  Sclera clear, conjunctiva pink Ears- hearing intact Oropharynx- clear Neck- supple,  Lungs- Clear to ausculation bilaterally, normal work of breathing Heart- Regular rate and rhythm, no murmurs, rubs or gallops, PMI not laterally displaced GI- soft, NT, ND, + BS Extremities- no clubbing, cyanosis, or edema MS- no significant deformity or atrophy Skin- no rash or lesion Psych- euthymic mood, full affect Neuro- strength and sensation are intact  EKG today reveals sinus rhythm  Echo is reviewed  Assessment and  Plan:  1. 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  She has failed medical therapy with flecainide. Therapeutic strategies for afib including medicine and ablation were discussed in detail with the patient today. Risk, benefits, and alternatives to EP study and radiofrequency ablation  for afib were also discussed in detail today. These risks include but are not limited to stroke, bleeding, vascular damage, tamponade, perforation, damage to the esophagus, lungs, and other structures, pulmonary vein stenosis, worsening renal function, and death. The patient understands these risk and wishes to proceed.  We will therefore proceed with catheter ablation at the next available time.

## 2015-02-24 NOTE — Progress Notes (Signed)
  Echocardiogram Echocardiogram Transesophageal has been performed.  Heidi Lopez 02/24/2015, 8:52 AM

## 2015-02-24 NOTE — Interval H&P Note (Signed)
History and Physical Interval Note:  02/24/2015 7:41 AM  Heidi Lopez  has presented today for surgery, with the diagnosis of afib  The various methods of treatment have been discussed with the patient and family. After consideration of risks, benefits and other options for treatment, the patient has consented to  Procedure(s): Atrial Fibrillation Ablation (N/A) as a surgical intervention .  The patient's history has been reviewed, patient examined, no change in status, stable for surgery.  I have reviewed the patient's chart and labs.  Questions were answered to the patient's satisfaction.     Thompson Grayer

## 2015-02-24 NOTE — Transfer of Care (Signed)
Immediate Anesthesia Transfer of Care Note  Patient: Heidi Lopez  Procedure(s) Performed: Procedure(s): Atrial Fibrillation Ablation (N/A)  Patient Location: PACU  Anesthesia Type:General  Level of Consciousness: awake, alert , patient cooperative and responds to stimulation  Airway & Oxygen Therapy: Patient Spontanous Breathing and Patient connected to nasal cannula oxygen  Post-op Assessment: Report given to RN and Post -op Vital signs reviewed and stable  Post vital signs: Reviewed and stable  Last Vitals:  Filed Vitals:   02/24/15 0945  BP: 137/82  Pulse: 126  Temp:   Resp: 18    Complications: No apparent anesthesia complications

## 2015-02-24 NOTE — Op Note (Signed)
LA, LAA without masses No PFO by color doppler TV normal  Mild TR AV normal NO AI PV normal NO PI MV normal Tr MR LVEF normal    Full report to follow

## 2015-02-24 NOTE — Progress Notes (Deleted)
  Echocardiogram 2D Echocardiogram has been performed.  Heidi Lopez 02/24/2015, 8:51 AM

## 2015-02-24 NOTE — Discharge Summary (Addendum)
ELECTROPHYSIOLOGY PROCEDURE DISCHARGE SUMMARY    Patient ID: Heidi Lopez,  MRN: [id number], DOB/AGE: 70-23-1947 70 y.o.  Admit date: 02/24/2015 Discharge date: 02/25/2015   Electrophysiologist: Thompson Grayer, MD  Primary Discharge Diagnosis:  afib  Secondary Discharge Diagnosis:  Atrial flutter  Procedures This Admission:  1.  Electrophysiology study and radiofrequency catheter ablation on 02/24/15 by Dr Thompson Grayer.    Brief HPI: Heidi Lopez is a 69 y.o. female with a history of  atrial fibrillation.  Risks, benefits, and alternatives to catheter ablation of atrial fibrillation were reviewed with the patient who wished to proceed.  The patient underwent TEE prior to the procedure which demonstrated normal LV function and no LAA thrombus.    Hospital Course:  The patient was admitted and underwent EPS/RFCA of atrial fibrillation with details as outlined above.  They were monitored on telemetry overnight which demonstrated sinus rhythm.  Groin was without complication on the day of discharge.  She did have some pleuritic pain post ablation.  Limited echo on day of discharge revealed no pericardial effusion.  The patient was examined and considered to be stable for discharge.  Wound care and restrictions were reviewed with the patient.  The patient will be seen back by Dr [name] in 12 weeks for post ablation follow up.   Physical Exam: Filed Vitals:   02/24/15 2200 02/25/15 0000 02/25/15 0200 02/25/15 0400  BP:  147/61 149/86 145/61  Pulse: 91 90  96  Temp:  97.9 F (36.6 C)  98.8 F (37.1 C)  TempSrc:  Oral  Oral  Resp: 21 14 16 17   Height:      Weight:      SpO2: 98% 94%      GEN- The patient is well appearing, alert and oriented x 3 today.   HEENT: normocephalic, atraumatic; sclera clear, conjunctiva pink; hearing intact; oropharynx clear; neck supple, no JVP Lymph- no cervical lymphadenopathy Lungs- Clear to ausculation bilaterally, normal work of breathing.  No  wheezes, rales, rhonchi Heart- Regular rate and rhythm, no murmurs, rubs or gallops, PMI not laterally displaced GI- soft, non-tender, non-distended, bowel sounds present, no hepatosplenomegaly Extremities- no clubbing, cyanosis, or edema; DP/PT/radial pulses 2+ bilaterally, groin without hematoma/bruit MS- no significant deformity or atrophy Skin- warm and dry, no rash or lesion Psych- euthymic mood, full affect Neuro- strength and sensation are intact   Labs:   Lab Results  Component Value Date   WBC 7.5 02/17/2015   HGB 13.5 02/17/2015   HCT 40.6 02/17/2015   MCV 92.5 02/17/2015   PLT 185.0 02/17/2015     Recent Labs Lab 02/25/15 0226  NA 137  K 3.7  CL 103  CO2 24  BUN 8  CREATININE 0.90  CALCIUM 8.0*  GLUCOSE 116*     Discharge Medications:    Medication List    TAKE these medications        albuterol 108 (90 BASE) MCG/ACT inhaler  Commonly known as:  PROVENTIL HFA;VENTOLIN HFA  Inhale into the lungs every 6 (six) hours as needed for wheezing or shortness of breath.     anastrozole 1 MG tablet  Commonly known as:  ARIMIDEX  Take 1 tablet (1 mg total) by mouth daily.     apixaban 5 MG Tabs tablet  Commonly known as:  ELIQUIS  Take 1 tablet (5 mg total) by mouth 2 (two) times daily.     brimonidine 0.2 % ophthalmic solution  Commonly known as:  ALPHAGAN  Place 1 drop into the left eye 2 (two) times daily.     CALTRATE 600+D PLUS PO  Take 1 tablet by mouth daily.     CoQ-10 200 MG Caps  Take 1 capsule by mouth daily.     Cranberry 500 MG Caps  Take 500 mg by mouth daily.     diltiazem 300 MG 24 hr capsule  Commonly known as:  CARDIZEM CD  Take 1 capsule (300 mg total) by mouth daily.     fexofenadine 180 MG tablet  Commonly known as:  ALLEGRA  Take 180 mg by mouth daily as needed for allergies or rhinitis.     Fish Oil 1200 MG Caps  Take 1 capsule by mouth daily.     furosemide 20 MG tablet  Commonly known as:  LASIX  Take 1 tablet (20  mg total) by mouth daily as needed. Take daily as needed for swelling     LUMIGAN 0.01 % Soln  Generic drug:  bimatoprost  Place 1 drop into both eyes at bedtime.     multivitamin tablet  Take 1 tablet by mouth daily.     pantoprazole 40 MG tablet  Commonly known as:  PROTONIX  Take 1 tablet (40 mg total) by mouth daily.     VITAMIN D PO  Take 2,000 Units by mouth daily.        Disposition:   Follow-up Information    Follow up with Thompson Grayer, MD On 05/25/2015.   Specialty:  Cardiology   Why:  9:15 am   Contact information:   [place] [address] [phone]       Follow up with CARROLL,DONNA, NP On 03/24/2015.   Specialties:  Nurse Practitioner, Cardiology   Why:  9 am   Contact information:   [place] [address] [phone]       Duration of Discharge Encounter: Greater than 30 minutes including physician time.  Thompson Grayer MD, [facility] 02/25/2015 7:02 AM

## 2015-02-24 NOTE — Anesthesia Postprocedure Evaluation (Signed)
  Anesthesia Post-op Note  Patient: Heidi Lopez  Procedure(s) Performed: Procedure(s): Atrial Fibrillation Ablation (N/A)  Patient Location: Cath Lab  Anesthesia Type:General  Level of Consciousness: awake, alert  and oriented  Airway and Oxygen Therapy: Patient Spontanous Breathing and Patient connected to nasal cannula oxygen  Post-op Pain: mild  Post-op Assessment: Post-op Vital signs reviewed, Patient's Cardiovascular Status Stable, Respiratory Function Stable, Patent Airway and Pain level controlled              Post-op Vital Signs: stable  Last Vitals:  Filed Vitals:   02/24/15 1745  BP: 150/68  Pulse: 75  Temp:   Resp: 17    Complications: No apparent anesthesia complications

## 2015-02-24 NOTE — Anesthesia Preprocedure Evaluation (Addendum)
Anesthesia Evaluation  Patient identified by MRN, date of birth, ID band Patient awake    Reviewed: Allergy & Precautions, NPO status , Patient's Chart, lab work & pertinent test results  History of Anesthesia Complications Negative for: history of anesthetic complications  Airway Mallampati: II  TM Distance: >3 FB Neck ROM: Full    Dental  (+) Teeth Intact, Dental Advisory Given   Pulmonary former smoker,    Pulmonary exam normal       Cardiovascular + dysrhythmias Atrial Fibrillation Rhythm:Irregular Rate:Normal     Neuro/Psych negative neurological ROS  negative psych ROS   GI/Hepatic negative GI ROS, Neg liver ROS,   Endo/Other  negative endocrine ROSMorbid obesity  Renal/GU negative Renal ROS     Musculoskeletal   Abdominal   Peds  Hematology   Anesthesia Other Findings   Reproductive/Obstetrics                           Anesthesia Physical Anesthesia Plan  ASA: III  Anesthesia Plan: General   Post-op Pain Management:    Induction: Intravenous  Airway Management Planned: LMA  Additional Equipment:   Intra-op Plan:   Post-operative Plan: Extubation in OR  Informed Consent: I have reviewed the patients History and Physical, chart, labs and discussed the procedure including the risks, benefits and alternatives for the proposed anesthesia with the patient or authorized representative who has indicated his/her understanding and acceptance.   Dental advisory given  Plan Discussed with: CRNA, Anesthesiologist and Surgeon  Anesthesia Plan Comments:       Anesthesia Quick Evaluation

## 2015-02-24 NOTE — Anesthesia Procedure Notes (Signed)
Procedure Name: LMA Insertion Performed by: Marita Burnsed J Pre-anesthesia Checklist: Patient identified, Emergency Drugs available, Suction available, Timeout performed and Patient being monitored Patient Re-evaluated:Patient Re-evaluated prior to inductionOxygen Delivery Method: Circle system utilized Preoxygenation: Pre-oxygenation with 100% oxygen Intubation Type: IV induction Ventilation: Mask ventilation without difficulty LMA: LMA inserted LMA Size: 4.0 Number of attempts: 1 Placement Confirmation: positive ETCO2,  CO2 detector and breath sounds checked- equal and bilateral Tube secured with: Tape Dental Injury: Teeth and Oropharynx as per pre-operative assessment      

## 2015-02-25 ENCOUNTER — Encounter (HOSPITAL_COMMUNITY): Payer: Self-pay | Admitting: Internal Medicine

## 2015-02-25 DIAGNOSIS — I483 Typical atrial flutter: Secondary | ICD-10-CM | POA: Diagnosis not present

## 2015-02-25 DIAGNOSIS — Z87891 Personal history of nicotine dependence: Secondary | ICD-10-CM | POA: Diagnosis not present

## 2015-02-25 DIAGNOSIS — Z853 Personal history of malignant neoplasm of breast: Secondary | ICD-10-CM | POA: Diagnosis not present

## 2015-02-25 DIAGNOSIS — I480 Paroxysmal atrial fibrillation: Secondary | ICD-10-CM | POA: Diagnosis not present

## 2015-02-25 LAB — BASIC METABOLIC PANEL
Anion gap: 10 (ref 5–15)
BUN: 8 mg/dL (ref 6–20)
CALCIUM: 8 mg/dL — AB (ref 8.9–10.3)
CO2: 24 mmol/L (ref 22–32)
Chloride: 103 mmol/L (ref 101–111)
Creatinine, Ser: 0.9 mg/dL (ref 0.44–1.00)
GFR calc non Af Amer: >60 mL/min (ref >60)
GLUCOSE: 116 mg/dL — AB (ref 65–99)
POTASSIUM: 3.7 mmol/L (ref 3.5–5.1)
SODIUM: 137 mmol/L (ref 135–145)

## 2015-02-25 MED ORDER — PANTOPRAZOLE SODIUM 40 MG PO TBEC: 40.0000 mg | DELAYED_RELEASE_TABLET | Freq: Every day | ORAL | Status: DC

## 2015-02-25 MED FILL — Bupivacaine HCl Preservative Free (PF) Inj 0.25%: INTRAMUSCULAR | Qty: 30 | Status: AC

## 2015-02-25 NOTE — Progress Notes (Signed)
Chest pain with inspiration overnight. Limited echo by me this am reveals no pericardial effusion or focal wall motion abnormality.  EKG reveals no ischemic changes.  DC to home.  Thompson Grayer MD, [facility] 02/25/2015 6:56 AM

## 2015-02-25 NOTE — Progress Notes (Signed)
Patient c/o sharp pain in the chest with deep inspiration. EKG done-NSR, no ST elevation noted, BP, O2 sats stable. Vicodin PRN administered, will continue to monitor.

## 2015-02-25 NOTE — Discharge Instructions (Signed)
No driving for 5 days. No lifting over 5 lbs for 1 week. No sexual activity for 1 week. You may return to work in 1 week. Keep procedure site clean & dry. If you notice increased pain, swelling, bleeding or pus, call/return!  You may shower, but no soaking baths/hot tubs/pools for 1 week.  ° ° °

## 2015-02-25 NOTE — Progress Notes (Signed)
DC orders received.  Patient stable with no S/S of distress.  Medication and discharge information reviewed with patient.  Patient awaiting ride with sister for DC. Heidi Lopez, Heidi Lopez [employer]man

## 2015-03-07 ENCOUNTER — Encounter (HOSPITAL_COMMUNITY): Payer: Self-pay | Admitting: Hematology & Oncology

## 2015-03-07 ENCOUNTER — Encounter (HOSPITAL_COMMUNITY): Payer: Medicare Other | Attending: Hematology & Oncology | Admitting: Hematology & Oncology

## 2015-03-07 VITALS — BP 123/52 | HR 81 | Temp 98.0°F | Resp 20 | Wt 195.9 lb

## 2015-03-07 DIAGNOSIS — Z853 Personal history of malignant neoplasm of breast: Secondary | ICD-10-CM | POA: Insufficient documentation

## 2015-03-07 DIAGNOSIS — N951 Menopausal and female climacteric states: Secondary | ICD-10-CM

## 2015-03-07 DIAGNOSIS — Z79811 Long term (current) use of aromatase inhibitors: Secondary | ICD-10-CM

## 2015-03-07 DIAGNOSIS — C50912 Malignant neoplasm of unspecified site of left female breast: Secondary | ICD-10-CM

## 2015-03-07 DIAGNOSIS — C50911 Malignant neoplasm of unspecified site of right female breast: Secondary | ICD-10-CM

## 2015-03-07 DIAGNOSIS — Z79899 Other long term (current) drug therapy: Secondary | ICD-10-CM

## 2015-03-07 DIAGNOSIS — M8580 Other specified disorders of bone density and structure, unspecified site: Secondary | ICD-10-CM | POA: Diagnosis not present

## 2015-03-07 NOTE — Progress Notes (Signed)
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 but for negative sentinel nodes. Estrogen receptor +95% progesterone receptor +96% Ki-67 marker low at 9% HER-2/neu not amplified status post lumpectomy followed by radiation therapy. Her surgery was 04/26/2010 and she started tamoxifen on 08/16/2010 after radiation therapy finished on 07/21/2010. She is having tremendous sweating and hot flashes .   Tamoxifen 10 mg daily started by Dr. 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 Estrogen receptors were 96% positive, progesterone  33% positive Ki-67 marker low 8% HER-2/neu negative and she did not receive any hormonal therapy after the radiation on the right side.  DEXA on 06/30/2012 with osteopenia but a noted INCREASE in BMD   Breast Cancer Index with high likelihood of benefit from extended endocrine therapy. BCI prognostic high risk category.   CURRENT THERAPY: Arimidex  INTERVAL HISTORY: Heidi Lopez 69 y.o. female returns for follow-up of bilateral breast cancers.   She is here alone today. She just underwent an electrophysiologic study and had two ablations. He said she had some pain with inspiration a few days after the procedure but is currently doing well. She notes she has still had intermittent episodes of A. Fib.  She says that since beginning Arimidex, she has had a lot more aches and pains.  She complains that her joints kind of 'freeze up' when she begins walking and she is stiff, but says she is fine once she gets going She currently is taking calcium and Vit. D (3000 mg a day) Currently taking Eliquis   She is also here to review the results of recent DEXA.    MEDICAL HISTORY: Past  Medical History  Diagnosis Date  . Cancer of breast July 2010    s/p XRT 2010 and again in 2011  . Glaucoma   . Cramps, muscle, general     severe breast cramping   . Night sweats   . Pain     breast pain  . Chronic coughing   . Sinus problem   . Wears glasses   . Glaucoma     left eye   . Joint pain     foot and knee pain  . Paroxysmal atrial fibrillation 05/2014  . Dysrhythmia     atrial flutter  . Lymphedema of arm     left  . Osteopenia 01/24/2015  . Breast cancer     has History of breast cancer, bilateral: Right T1N0 Lumpectomy July 2010, Left T1c, N0 Lumpectomy 04/26/2010.; Pain in joint, ankle and foot; A-fib; Snoring; Atypical glandular cells of undetermined significance (AGUS) on cervical Pap smear; Thickened endometrium; Use of tamoxifen (Nolvadex); Osteopenia; and Atrial flutter on her problem list.     is allergic to metoprolol and reglan.  Heidi Lopez does not currently have medications on file.  SURGICAL HISTORY: Past Surgical History  Procedure Laterality Date  . Tubal ligation  1984  . Colonoscopy N/A 02/05/2013    Procedure: COLONOSCOPY;  Surgeon: [name], MD;  [facility];  Service: Endoscopy;  Laterality: N/A;  830  . Cardioversion N/A 10/21/2014    Procedure: CARDIOVERSION;  Surgeon: [name], MD;  Location: [place];  Service: Cardiovascular;  Laterality: N/A;  .  Breast surgery  2010- right and 2011-left    for cancer  . Hysteroscopy w/d&c N/A 12/29/2014    Procedure: DILATATION AND CURETTAGE /HYSTEROSCOPY;  Surgeon: [name], MD;  Location: [facility];  Service: Gynecology;  Laterality: N/A;  . Tee without cardioversion N/A 02/24/2015    Procedure: TRANSESOPHAGEAL ECHOCARDIOGRAM (TEE);  Surgeon: [name], MD;  Location: [facility];  Service: Cardiovascular;  Laterality: N/A;  . Electrophysiologic study N/A 02/24/2015    Procedure: Atrial Fibrillation Ablation;  Surgeon: Thompson Grayer, MD;  Location: [place] CV LAB;   Service: Cardiovascular;  Laterality: N/A;    SOCIAL HISTORY: History   Social History  . Marital Status: Widowed    Spouse Name: N/A  . Number of Children: N/A  . Years of Education: N/A   Occupational History  . Not on file.   Social History Main Topics  . Smoking status: Former Smoker -- 1.00 packs/day for 2 years    Types: Cigarettes    Start date: 09/17/1962    Quit date: 11/16/1966  . Smokeless tobacco: Never Used  . Alcohol Use: No  . Drug Use: No  . Sexual Activity: Yes    Birth Control/ Protection: Post-menopausal   Other Topics Concern  . Not on file   Social History Narrative   Lives alone in [place]   Retired   Worked previously for [employer] and [employer]    FAMILY HISTORY: Family History  Problem Relation Age of Onset  . Heart disease Mother   . Stroke Father   . Cancer Father   . Early death Brother   . Cancer Sister   . Cancer Sister   . Cancer Sister   . Diabetes Daughter     Review of Systems  Constitutional: Negative for fever, chills, weight loss and malaise/fatigue.  HENT: Negative for congestion, hearing loss, nosebleeds, sore throat and tinnitus.   Eyes: Negative for blurred vision, double vision, pain and discharge.  Respiratory: Negative for cough, hemoptysis, sputum production, shortness of breath and wheezing.   Cardiovascular: Negative for chest pain, palpitations, claudication, leg swelling and PND.  Gastrointestinal: Negative for heartburn, nausea, vomiting, abdominal pain, diarrhea, constipation, blood in stool and melena.  Genitourinary: Negative for dysuria, urgency, frequency and hematuria.  Musculoskeletal: Negative for myalgias and falls. Positive for joint pain Skin: Negative for itching and rash.  Neurological: Negative for dizziness, tingling, tremors, sensory change, speech change, focal weakness, seizures, loss of consciousness, weakness and headaches.  Endo/Heme/Allergies: Does not bruise/bleed easily.    Psychiatric/Behavioral: Negative for depression, suicidal ideas, memory loss and substance abuse. The patient is not nervous/anxious and does not have insomnia.    PHYSICAL EXAMINATION  ECOG PERFORMANCE STATUS: 0 - Asymptomatic  Filed Vitals:   03/07/15 1335  BP: 123/52  Pulse: 81  Temp: 98 F (36.7 C)  Resp: 20    Physical Exam  Constitutional: She is oriented to person, place, and time and well-developed, well-nourished, and in no distress.  HENT:  Head: Normocephalic and atraumatic.  Nose: Nose normal.  Mouth/Throat: Oropharynx is clear and moist. No oropharyngeal exudate.  Eyes: Conjunctivae and EOM are normal. Pupils are equal, round, and reactive to light. Right eye exhibits no discharge. Left eye exhibits no discharge. No scleral icterus.  Neck: Normal range of motion. Neck supple. No tracheal deviation present. No thyromegaly present.  Cardiovascular:   Exam reveals no gallop and no friction rub.   No murmur heard. S1/S2 audible and regular Pulmonary/Chest: Effort normal and breath  sounds normal. She has no wheezes. She has no rales.  Abdominal: Soft. Bowel sounds are normal. She exhibits no distension and no mass. There is no tenderness. There is no rebound and no guarding.  Musculoskeletal: Normal range of motion. She exhibits no edema.  Lymphadenopathy:    She has no cervical adenopathy.  Neurological: She is alert and oriented to person, place, and time. She has normal reflexes. No cranial nerve deficit. Gait normal. Coordination normal.  Skin: Skin is warm and dry. No rash noted.  Psychiatric: Mood, memory, affect and judgment normal.  Nursing note and vitals reviewed.   LABORATORY DATA:  CBC    Component Value Date/Time   WBC 7.5 02/17/2015 0953   RBC 4.39 02/17/2015 0953   HGB 13.5 02/17/2015 0953   HCT 40.6 02/17/2015 0953   PLT 185.0 02/17/2015 0953   MCV 92.5 02/17/2015 0953   MCH 31.0 12/24/2014 1420   MCHC 33.3 02/17/2015 0953   RDW 13.7  02/17/2015 0953   LYMPHSABS 2.5 02/17/2015 0953   MONOABS 0.8 02/17/2015 0953   EOSABS 0.2 02/17/2015 0953   BASOSABS 0.1 02/17/2015 0953   CMP     Component Value Date/Time   NA 137 02/25/2015 0226   K 3.7 02/25/2015 0226   CL 103 02/25/2015 0226   CO2 24 02/25/2015 0226   GLUCOSE 116* 02/25/2015 0226   BUN 8 02/25/2015 0226   CREATININE 0.90 02/25/2015 0226   CALCIUM 8.0* 02/25/2015 0226   PROT 7.0 12/24/2014 1420   ALBUMIN 4.3 12/24/2014 1420   AST 39* 12/24/2014 1420   ALT 43* 12/24/2014 1420   ALKPHOS 67 12/24/2014 1420   BILITOT 0.4 12/24/2014 1420   GFRNONAA >60 02/25/2015 0226   GFRAA >60 02/25/2015 0226    PATHOLOGY:L DIAGNOSIS Diagnosis Endometrium, curettage - FRAGMENTS OF BENIGN ENDOMETRIAL TYPE POLYP. - BENIGN ENDOCERVICAL AND SQUAMOUS MUCOSA. - VEGETABLE MATTER. - NO MALIGNANCY IDENTIFIED. - SEE COMMENT. Microscopic Comment There are fragments of benign endometrial type polyp. There are only a few scant fragments of inactive endometrium in the background. There is abundant vegetable matter, which may represent contamination if the patient does not have a known fistula. Clinical correlation is recommended. Vicente Males MD Pathologist, Electronic Signature (Case signed 12/31/2014)    ASSESSMENT and THERAPY PLAN:   History of ER+ bilateral breast cancer Intolerance to $RemoveBefore'20mg'[id number]$  Tamoxifen secondary to hot flashes Intolerance to aromasin secondary to hot flashes Abnormal endometrial lining, with hysteroscopy and D&C without significant pathology BCI prognostic; high risk category, BCI predictive high likelihood of benefit from extended endocrine RX Osteopenia  She has been on Arimidex with fairly good tolerance. She has noted an increase in joint stiffness but does not feel it is limiting. She is taking calcium and vitamin D daily.  We addressed her DEXA that shows osteopenia. We discussed Prolia and/or bisphosphonate therapy. She needs to have a dental exam  but wants to postpone that until after she is potentially off of her Eliquis. I also discussed weightbearing exercise as a way to improve or maintain her bone density.  I will see her back in two months for reassessment. We will readdress all of the above issues at follow-up.  All questions were answered. The patient knows to call the clinic with any problems, questions or concerns. We can certainly see the patient much sooner if necessary.   This note was electronically signed.  This document serves as a record of services personally performed by Ancil Linsey, MD. It was created on  her behalf by Janace Hoard, a trained medical scribe. The creation of this record is based on the scribe's personal observations and the provider's statements to them. This document has been checked and approved by the attending provider.  I have reviewed the above documentation for accuracy and completeness, and I agree with the above.  Ancil Linsey, MD

## 2015-03-07 NOTE — Patient Instructions (Signed)
[place] at [facility] Discharge Instructions  RECOMMENDATIONS MADE BY THE CONSULTANT AND ANY TEST RESULTS WILL BE SENT TO YOUR REFERRING PHYSICIAN.  You saw Dr. Whitney Muse today. Follow up in 2 months.  Thank you for choosing [place] at [facility] to provide your oncology and hematology care.  To afford each patient quality time with our provider, please arrive at least 15 minutes before your scheduled appointment time.    You need to re-schedule your appointment should you arrive 10 or more minutes late.  We strive to give you quality time with our providers, and arriving late affects you and other patients whose appointments are after yours.  Also, if you no show three or more times for appointments you may be dismissed from the clinic at the providers discretion.     Again, thank you for choosing [facility].  Our hope is that these requests will decrease the amount of time that you wait before being seen by our physicians.       _____________________________________________________________  Should you have questions after your visit to [facility], please contact our office at [phone] between the hours of 8:30 a.m. and 4:30 p.m.  Voicemails left after 4:30 p.m. will not be returned until the following business day.  For prescription refill requests, have your pharmacy contact our office.

## 2015-03-07 NOTE — Progress Notes (Signed)
Heidi Lopez presented for lab work. Labs per MD order drawn via Peripheral Line 23 gauge needle inserted in right hand  Good blood return present. Procedure without incident.  Needle removed intact. Patient tolerated procedure well.

## 2015-03-08 LAB — VITAMIN D 25 HYDROXY (VIT D DEFICIENCY, FRACTURES): Vit D, 25-Hydroxy: 36.6 ng/mL (ref 30.0–100.0)

## 2015-03-24 ENCOUNTER — Ambulatory Visit (HOSPITAL_COMMUNITY)
Admission: RE | Admit: 2015-03-24 | Discharge: 2015-03-24 | Disposition: A | Discharge status: Home / Self Care | Payer: Medicare Other | Source: Ambulatory Visit | Attending: Nurse Practitioner | Admitting: Nurse Practitioner

## 2015-03-24 ENCOUNTER — Other Ambulatory Visit: Payer: Self-pay

## 2015-03-24 ENCOUNTER — Encounter (HOSPITAL_COMMUNITY): Payer: Self-pay | Admitting: Nurse Practitioner

## 2015-03-24 VITALS — BP 120/78 | HR 80 | Ht 63.0 in | Wt 194.6 lb

## 2015-03-24 DIAGNOSIS — I480 Paroxysmal atrial fibrillation: Secondary | ICD-10-CM

## 2015-03-24 DIAGNOSIS — Z9889 Other specified postprocedural states: Secondary | ICD-10-CM | POA: Diagnosis not present

## 2015-03-24 DIAGNOSIS — I481 Persistent atrial fibrillation: Secondary | ICD-10-CM | POA: Diagnosis present

## 2015-03-24 DIAGNOSIS — I4892 Unspecified atrial flutter: Secondary | ICD-10-CM | POA: Diagnosis not present

## 2015-03-24 NOTE — Progress Notes (Signed)
Patient ID: Heidi Lopez, female   DOB: 1945-12-17, 69 y.o.   MRN: [id number]     Primary Care Physician: Heidi Noble, MD Referring Physician:   ANGELO Lopez is a 69 y.o. female with a h/o afib/flutter that is being evaluated in the afib clinic following afib ablation 6/9. She reports that she had some chest soreness for the first few days but this has resolved. No issues with swallowing or rt groin pain. Has noticed some palpitations during some evenings when she is sitting quietly but this lasts a few minutes to a hour. Continues to take [facility] on a regular basis.  Today, she denies symptoms of palpitations,except as mentioned above, chest pain, shortness of breath, orthopnea, PND, lower extremity edema, dizziness, presyncope, syncope, or neurologic sequela. The patient is tolerating medications without difficulties and is otherwise without complaint today.   Past Medical History  Diagnosis Date  . Cancer of breast July 2010    s/p XRT 2010 and again in 2011  . Glaucoma   . Cramps, muscle, general     severe breast cramping   . Night sweats   . Pain     breast pain  . Chronic coughing   . Sinus problem   . Wears glasses   . Glaucoma     left eye   . Joint pain     foot and knee pain  . Paroxysmal atrial fibrillation 05/2014  . Dysrhythmia     atrial flutter  . Lymphedema of arm     left  . Osteopenia 01/24/2015  . Breast cancer    Past Surgical History  Procedure Laterality Date  . Tubal ligation  1984  . Colonoscopy N/A 02/05/2013    Procedure: COLONOSCOPY;  Surgeon: [name], MD;  [facility];  Service: Endoscopy;  Laterality: N/A;  830  . Cardioversion N/A 10/21/2014    Procedure: CARDIOVERSION;  Surgeon: [name], MD;  Location: [facility] ENDOSCOPY;  Service: Cardiovascular;  Laterality: N/A;  . Breast surgery  2010- right and 2011-left    for cancer  . Hysteroscopy w/d&c N/A 12/29/2014    Procedure: DILATATION AND CURETTAGE /HYSTEROSCOPY;  Surgeon:  [name], MD;  Location: [facility];  Service: Gynecology;  Laterality: N/A;  . Tee without cardioversion N/A 02/24/2015    Procedure: TRANSESOPHAGEAL ECHOCARDIOGRAM (TEE);  Surgeon: [name], MD;  Location: [facility] ENDOSCOPY;  Service: Cardiovascular;  Laterality: N/A;  . Electrophysiologic study N/A 02/24/2015    Procedure: Atrial Fibrillation Ablation;  Surgeon: Thompson Grayer, MD;  Location: [place] CV LAB;  Service: Cardiovascular;  Laterality: N/A;    Current Outpatient Prescriptions  Medication Sig Dispense Refill  . albuterol (PROVENTIL HFA;VENTOLIN HFA) 108 (90 BASE) MCG/ACT inhaler Inhale into the lungs every 6 (six) hours as needed for wheezing or shortness of breath.    . anastrozole (ARIMIDEX) 1 MG tablet Take 1 tablet (1 mg total) by mouth daily. 30 tablet 3  . apixaban (ELIQUIS) 5 MG TABS tablet Take 1 tablet (5 mg total) by mouth 2 (two) times daily. 180 tablet 3  . brimonidine (ALPHAGAN) 0.2 % ophthalmic solution Place 1 drop into the left eye 2 (two) times daily.     . Calcium Carbonate-Vit D-Min (CALTRATE 600+D PLUS PO) Take 1 tablet by mouth daily.    . Cholecalciferol (VITAMIN D PO) Take 2,000 Units by mouth daily.     . Coenzyme Q10 (COQ-10) 200 MG CAPS Take 1 capsule by mouth daily.    Marland Kitchen  Cranberry 500 MG CAPS Take 500 mg by mouth daily.      Marland Kitchen diltiazem (CARDIZEM CD) 300 MG 24 hr capsule Take 1 capsule (300 mg total) by mouth daily. 90 capsule 3  . fexofenadine (ALLEGRA) 180 MG tablet Take 180 mg by mouth daily as needed for allergies or rhinitis.    . furosemide (LASIX) 20 MG tablet Take 1 tablet (20 mg total) by mouth daily as needed. Take daily as needed for swelling 90 tablet 3  . LUMIGAN 0.01 % SOLN Place 1 drop into both eyes at bedtime.    . Multiple Vitamin (MULTIVITAMIN) tablet Take 1 tablet by mouth daily.     . Omega-3 Fatty Acids (FISH OIL) 1200 MG CAPS Take 1 capsule by mouth daily.    . pantoprazole (PROTONIX) 40 MG tablet Take 1 tablet (40 mg total) by mouth  daily. 46 tablet 0   No current facility-administered medications for this encounter.    Allergies  Allergen Reactions  . Metoprolol     Makes her feel bad  . Reglan [Metoclopramide] Other (See Comments)    Causes severe jitters    History   Social History  . Marital Status: Widowed    Spouse Name: N/A  . Number of Children: N/A  . Years of Education: N/A   Occupational History  . Not on file.   Social History Main Topics  . Smoking status: Former Smoker -- 1.00 packs/day for 2 years    Types: Cigarettes    Start date: 09/17/1962    Quit date: 11/16/1966  . Smokeless tobacco: Never Used  . Alcohol Use: No  . Drug Use: No  . Sexual Activity: Yes    Birth Control/ Protection: Post-menopausal   Other Topics Concern  . Not on file   Social History Narrative   Lives alone in [place]   Retired   Worked previously for [employer] and [employer]    Family History  Problem Relation Age of Onset  . Heart disease Mother   . Stroke Father   . Cancer Father   . Early death Brother   . Cancer Sister   . Cancer Sister   . Cancer Sister   . Diabetes Daughter     ROS- All systems are reviewed and negative except as per the HPI above  Physical Exam: Filed Vitals:   03/24/15 0904  BP: 120/78  Pulse: 80  Height: 5\' 3"  (1.6 m)  Weight: 194 lb 9.6 oz (88.27 kg)    GEN- The patient is well appearing, alert and oriented x 3 today.   Head- normocephalic, atraumatic Eyes-  Sclera clear, conjunctiva pink Ears- hearing intact Oropharynx- clear Neck- supple, no JVP Lymph- no cervical lymphadenopathy Lungs- Clear to ausculation bilaterally, normal work of breathing Heart- Regular rate and rhythm, no murmurs, rubs or gallops, PMI not laterally displaced GI- soft, NT, ND, + BS Extremities- no clubbing, cyanosis, or edema MS- no significant deformity or atrophy Skin- no rash or lesion Psych- euthymic mood, full affect Neuro- strength and sensation are intact  EKG-  NSR, LAD, Pr int 168 ms, QRS 80 ms, QTc440 ms Epic records reviewed  Assessment and Plan: 1.Persisitent afib, s/p ablation 6/9 Maintaining SR Continue diltiazem and eliquis  F/u with Dr. [name] as scheduled 9/7, afib clinic as needed

## 2015-04-14 ENCOUNTER — Other Ambulatory Visit (HOSPITAL_COMMUNITY): Payer: Self-pay | Admitting: Hematology & Oncology

## 2015-04-14 DIAGNOSIS — C50912 Malignant neoplasm of unspecified site of left female breast: Principal | ICD-10-CM

## 2015-04-14 DIAGNOSIS — Z9889 Other specified postprocedural states: Secondary | ICD-10-CM

## 2015-04-14 DIAGNOSIS — C50911 Malignant neoplasm of unspecified site of right female breast: Secondary | ICD-10-CM

## 2015-04-21 ENCOUNTER — Other Ambulatory Visit (HOSPITAL_COMMUNITY): Payer: Self-pay

## 2015-04-21 DIAGNOSIS — Z853 Personal history of malignant neoplasm of breast: Secondary | ICD-10-CM

## 2015-04-22 ENCOUNTER — Ambulatory Visit
Admission: RE | Admit: 2015-04-22 | Discharge: 2015-04-22 | Disposition: A | Discharge status: Home / Self Care | Payer: Medicare Other | Source: Ambulatory Visit | Attending: Hematology & Oncology | Admitting: Hematology & Oncology

## 2015-04-22 DIAGNOSIS — Z9889 Other specified postprocedural states: Secondary | ICD-10-CM

## 2015-04-22 DIAGNOSIS — C50912 Malignant neoplasm of unspecified site of left female breast: Principal | ICD-10-CM

## 2015-04-22 DIAGNOSIS — C50911 Malignant neoplasm of unspecified site of right female breast: Secondary | ICD-10-CM

## 2015-05-05 ENCOUNTER — Ambulatory Visit (HOSPITAL_COMMUNITY): Payer: Medicare Other | Admitting: Hematology & Oncology

## 2015-05-05 ENCOUNTER — Other Ambulatory Visit (HOSPITAL_COMMUNITY): Payer: Medicare Other

## 2015-05-09 ENCOUNTER — Ambulatory Visit: Payer: Medicare Other | Admitting: Internal Medicine

## 2015-05-10 ENCOUNTER — Ambulatory Visit (HOSPITAL_COMMUNITY): Payer: Medicare Other | Admitting: Hematology & Oncology

## 2015-05-10 ENCOUNTER — Other Ambulatory Visit (HOSPITAL_COMMUNITY): Payer: Medicare Other

## 2015-05-19 ENCOUNTER — Other Ambulatory Visit (HOSPITAL_COMMUNITY): Payer: Self-pay | Admitting: Hematology & Oncology

## 2015-05-19 ENCOUNTER — Other Ambulatory Visit (HOSPITAL_COMMUNITY): Payer: Self-pay | Admitting: Oncology

## 2015-05-19 DIAGNOSIS — Z79899 Other long term (current) drug therapy: Secondary | ICD-10-CM

## 2015-05-19 DIAGNOSIS — M8580 Other specified disorders of bone density and structure, unspecified site: Secondary | ICD-10-CM

## 2015-05-19 DIAGNOSIS — Z780 Asymptomatic menopausal state: Secondary | ICD-10-CM

## 2015-05-19 MED ORDER — ANASTROZOLE 1 MG PO TABS: 1.0000 mg | ORAL_TABLET | Freq: Every day | ORAL | Status: DC

## 2015-05-25 ENCOUNTER — Other Ambulatory Visit: Payer: Self-pay

## 2015-05-25 ENCOUNTER — Ambulatory Visit (INDEPENDENT_AMBULATORY_CARE_PROVIDER_SITE_OTHER): Payer: Medicare Other | Admitting: Internal Medicine

## 2015-05-25 ENCOUNTER — Encounter: Payer: Self-pay | Admitting: Internal Medicine

## 2015-05-25 VITALS — BP 120/80 | HR 86 | Ht 63.0 in | Wt 192.6 lb

## 2015-05-25 DIAGNOSIS — I480 Paroxysmal atrial fibrillation: Secondary | ICD-10-CM

## 2015-05-25 MED ORDER — DILTIAZEM HCL ER COATED BEADS 240 MG PO CP24: 240.0000 mg | ORAL_CAPSULE | Freq: Every day | ORAL | Status: DC

## 2015-05-25 NOTE — Patient Instructions (Addendum)
Medication Instructions:  Your physician recommends that you continue on your current medications as directed. Please refer to the Current Medication list given to you today.   Labwork: N/A  Testing/Procedures: N/A  Follow-Up: Your physician wants you to follow-up in: 3 months with Dr. [name]  Any Other Special Instructions Will Be Listed Below (If Applicable).

## 2015-05-25 NOTE — Progress Notes (Signed)
PCP: [name], MD Primary Cardiologist: Dr Doug Sou is a 69 y.o. female who presents today for routine electrophysiology followup.  Since her recent ablation, the patient reports doing very well.  Denies procedure related complications.  Continues to have some palpitations though overall improved.  Today, she denies symptoms of chest pain, shortness of breath,  lower extremity edema, dizziness, presyncope, or syncope.  The patient is otherwise without complaint today.   Past Medical History  Diagnosis Date  . Cancer of breast July 2010    s/p XRT 2010 and again in 2011  . Glaucoma   . Cramps, muscle, general     severe breast cramping   . Night sweats   . Pain     breast pain  . Chronic coughing   . Sinus problem   . Wears glasses   . Glaucoma     left eye   . Joint pain     foot and knee pain  . Paroxysmal atrial fibrillation 05/2014  . Dysrhythmia     atrial flutter  . Lymphedema of arm     left  . Osteopenia 01/24/2015  . Breast cancer    Past Surgical History  Procedure Laterality Date  . Tubal ligation  1984  . Colonoscopy N/A 02/05/2013    Procedure: COLONOSCOPY;  Surgeon: [name], MD;  [facility];  Service: Endoscopy;  Laterality: N/A;  830  . Cardioversion N/A 10/21/2014    Procedure: CARDIOVERSION;  Surgeon: [name], MD;  Location: [facility] ENDOSCOPY;  Service: Cardiovascular;  Laterality: N/A;  . Breast surgery  2010- right and 2011-left    for cancer  . Hysteroscopy w/d&c N/A 12/29/2014    Procedure: DILATATION AND CURETTAGE /HYSTEROSCOPY;  Surgeon: [name], MD;  [facility];  Service: Gynecology;  Laterality: N/A;  . Tee without cardioversion N/A 02/24/2015    Procedure: TRANSESOPHAGEAL ECHOCARDIOGRAM (TEE);  Surgeon: [name], MD;  Location: [facility] ENDOSCOPY;  Service: Cardiovascular;  Laterality: N/A;  . Electrophysiologic study N/A 02/24/2015    PVI and CTI ablation by Dr [name]    ROS- all systems are reviewed and  negatives except as per HPI above  Current Outpatient Prescriptions  Medication Sig Dispense Refill  . albuterol (PROVENTIL HFA;VENTOLIN HFA) 108 (90 BASE) MCG/ACT inhaler Inhale into the lungs every 6 (six) hours as needed for wheezing or shortness of breath.    . anastrozole (ARIMIDEX) 1 MG tablet Take 1 tablet (1 mg total) by mouth daily. 30 tablet 3  . apixaban (ELIQUIS) 5 MG TABS tablet Take 1 tablet (5 mg total) by mouth 2 (two) times daily. 180 tablet 3  . brimonidine (ALPHAGAN) 0.2 % ophthalmic solution Place 1 drop into the left eye 2 (two) times daily.     . Calcium Carbonate-Vit D-Min (CALTRATE 600+D PLUS PO) Take 1 tablet by mouth daily.    . Cholecalciferol (VITAMIN D PO) Take 2,000 Units by mouth daily.     . Coenzyme Q10 (COQ-10) 200 MG CAPS Take 1 capsule by mouth daily.    . Cranberry 500 MG CAPS Take 500 mg by mouth 2 (two) times daily.     Marland Kitchen diltiazem (DILACOR XR) 240 MG 24 hr capsule Take 240 mg by mouth daily.    . furosemide (LASIX) 20 MG tablet Take 1 tablet (20 mg total) by mouth daily as needed. Take daily as needed for swelling 90 tablet 3  . loratadine (CLARITIN) 10 MG tablet Take 10  mg by mouth daily.    Marland Kitchen LUMIGAN 0.01 % SOLN Place 1 drop into both eyes at bedtime.    . Multiple Vitamin (MULTIVITAMIN) tablet Take 1 tablet by mouth daily.     . Omega-3 Fatty Acids (FISH OIL) 1200 MG CAPS Take 1 capsule by mouth daily.     No current facility-administered medications for this visit.    Physical Exam: Filed Vitals:   05/25/15 0846  BP: 120/80  Pulse: 86  Height: 5\' 3"  (1.6 m)  Weight: 87.363 kg (192 lb 9.6 oz)    GEN- The patient is well appearing, alert and oriented x 3 today.   Head- normocephalic, atraumatic Eyes-  Sclera clear, conjunctiva pink Ears- hearing intact Oropharynx- clear Lungs- Clear to ausculation bilaterally, normal work of breathing Heart- Regular rate and rhythm, no murmurs, rubs or gallops, PMI not laterally displaced GI- soft, NT,  ND, + BS Extremities- no clubbing, cyanosis, or edema  ekg today reveals sinus rhythm  Assessment and Plan:  1. AFib Doing well s/p ablation Likely has had some ERAF Will continue current medicines and reassess in 3 months Consider implantable loop recorder if still having palpitations at that time Thompson Grayer MD, [facility] 05/25/2015 9:47 AM

## 2015-05-30 ENCOUNTER — Encounter (HOSPITAL_BASED_OUTPATIENT_CLINIC_OR_DEPARTMENT_OTHER): Payer: Medicare Other

## 2015-05-30 ENCOUNTER — Encounter (HOSPITAL_COMMUNITY): Payer: Medicare Other | Attending: Hematology & Oncology | Admitting: Hematology & Oncology

## 2015-05-30 ENCOUNTER — Encounter (HOSPITAL_COMMUNITY): Payer: Self-pay | Admitting: Hematology & Oncology

## 2015-05-30 VITALS — BP 121/73 | HR 83 | Temp 97.7°F | Resp 18 | Wt 192.7 lb

## 2015-05-30 DIAGNOSIS — C50912 Malignant neoplasm of unspecified site of left female breast: Secondary | ICD-10-CM

## 2015-05-30 DIAGNOSIS — M8580 Other specified disorders of bone density and structure, unspecified site: Secondary | ICD-10-CM

## 2015-05-30 DIAGNOSIS — M25579 Pain in unspecified ankle and joints of unspecified foot: Secondary | ICD-10-CM

## 2015-05-30 DIAGNOSIS — Z853 Personal history of malignant neoplasm of breast: Secondary | ICD-10-CM | POA: Diagnosis present

## 2015-05-30 DIAGNOSIS — C50911 Malignant neoplasm of unspecified site of right female breast: Secondary | ICD-10-CM

## 2015-05-30 LAB — COMPREHENSIVE METABOLIC PANEL
ALBUMIN: 4 g/dL (ref 3.5–5.0)
ALT: 45 U/L (ref 14–54)
ANION GAP: 6 (ref 5–15)
AST: 36 U/L (ref 15–41)
Alkaline Phosphatase: 69 U/L (ref 38–126)
BILIRUBIN TOTAL: 0.5 mg/dL (ref 0.3–1.2)
BUN: 15 mg/dL (ref 6–20)
CALCIUM: 8.9 mg/dL (ref 8.9–10.3)
CO2: 24 mmol/L (ref 22–32)
Chloride: 107 mmol/L (ref 101–111)
Creatinine, Ser: 0.66 mg/dL (ref 0.44–1.00)
GFR calc Af Amer: >60 mL/min (ref >60)
GFR calc non Af Amer: >60 mL/min (ref >60)
GLUCOSE: 117 mg/dL — AB (ref 65–99)
Potassium: 4.1 mmol/L (ref 3.5–5.1)
SODIUM: 137 mmol/L (ref 135–145)
Total Protein: 6.7 g/dL (ref 6.5–8.1)

## 2015-05-30 LAB — CBC WITH DIFFERENTIAL/PLATELET
BASOS ABS: 0 10*3/uL (ref 0.0–0.1)
BASOS PCT: 1 % (ref 0–1)
EOS ABS: 0.2 10*3/uL (ref 0.0–0.7)
Eosinophils Relative: 2 % (ref 0–5)
HEMATOCRIT: 40.5 % (ref 36.0–46.0)
HEMOGLOBIN: 14 g/dL (ref 12.0–15.0)
Lymphocytes Relative: 33 % (ref 12–46)
Lymphs Abs: 2.6 10*3/uL (ref 0.7–4.0)
MCH: 31.3 pg (ref 26.0–34.0)
MCHC: 34.6 g/dL (ref 30.0–36.0)
MCV: 90.6 fL (ref 78.0–100.0)
MONOS PCT: 11 % (ref 3–12)
Monocytes Absolute: 0.9 10*3/uL (ref 0.1–1.0)
NEUTROS PCT: 53 % (ref 43–77)
Neutro Abs: 4.1 10*3/uL (ref 1.7–7.7)
Platelets: 203 10*3/uL (ref 150–400)
RBC: 4.47 MIL/uL (ref 3.87–5.11)
RDW: 12.6 % (ref 11.5–15.5)
WBC: 7.8 10*3/uL (ref 4.0–10.5)

## 2015-05-30 NOTE — Progress Notes (Signed)
LABS DRAWN

## 2015-05-30 NOTE — Progress Notes (Signed)
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 but for negative sentinel nodes. Estrogen receptor +95% progesterone receptor +96% Ki-67 marker low at 9% HER-2/neu not amplified status post lumpectomy followed by radiation therapy. Her surgery was 04/26/2010 and she started tamoxifen on 08/16/2010 after radiation therapy finished on 07/21/2010. She is having tremendous sweating and hot flashes .   Tamoxifen 10 mg daily started by Dr. 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 Estrogen receptors were 96% positive, progesterone  33% positive Ki-67 marker low 8% HER-2/neu negative and she did not receive any hormonal therapy after the radiation on the right side.  DEXA on 06/30/2012 with osteopenia but a noted INCREASE in BMD   Breast Cancer Index with high likelihood of benefit from extended endocrine therapy. BCI prognostic high risk category.   CURRENT THERAPY: Arimidex  INTERVAL HISTORY: Heidi Lopez 69 y.o. female returns for follow-up of bilateral breast cancers.   Heidi Lopez is here alone and feeling very well today.  Heidi Lopez plans to have her teeth worked on soon. Her dentist is [name].   She has reduced the dosage of her dilitiazem. Reporting that her ankles still swell. Since beginning the Arimidex, her ankles have become more stiff. She reports that as she moves around, it helps to alleviate the pain. She is not sure if the ankle pain is from the stiffness or the swelling.  She is currently taking Eliquis. She notes her A fib was acting up recently but has improved. Her physician mentioned implanting a device to monitor her A fib regularly. She has been exercising regularly by walking  around the grocery store and [place].   She currently takes 900 mg calcium total per day from her calcium supplement and daily multivitamin. She currently takes 3000 units of Vitamin D per day. She is up to date on her mammogram with the latest done in August.  She denies any bowel problems, constipation, or GI bleeding.   MEDICAL HISTORY: Past Medical History  Diagnosis Date  . Cancer of breast July 2010    s/p XRT 2010 and again in 2011  . Glaucoma   . Cramps, muscle, general     severe breast cramping   . Night sweats   . Pain     breast pain  . Chronic coughing   . Sinus problem   . Wears glasses   . Glaucoma     left eye   . Joint pain     foot and knee pain  . Paroxysmal atrial fibrillation 05/2014  . Dysrhythmia     atrial flutter  . Lymphedema of arm     left  . Osteopenia 01/24/2015  . Breast cancer     has History of breast cancer, bilateral: Right T1N0 Lumpectomy July 2010, Left T1c, N0 Lumpectomy 04/26/2010.; Pain in joint, ankle and foot; A-fib; Snoring; Atypical glandular cells of undetermined significance (AGUS) on cervical Pap smear; Thickened endometrium; Use of tamoxifen (Nolvadex); Osteopenia; and Atrial flutter on her problem list.     is allergic to metoprolol and reglan.  Heidi Lopez does not currently have medications on file.  SURGICAL HISTORY: Past Surgical History  Procedure Laterality Date  . Tubal  ligation  1984  . Colonoscopy N/A 02/05/2013    Procedure: COLONOSCOPY;  Surgeon: [name], MD;  [facility];  Service: Endoscopy;  Laterality: N/A;  830  . Cardioversion N/A 10/21/2014    Procedure: CARDIOVERSION;  Surgeon: [name], MD;  Location: [facility] ENDOSCOPY;  Service: Cardiovascular;  Laterality: N/A;  . Breast surgery  2010- right and 2011-left    for cancer  . Hysteroscopy w/d&c N/A 12/29/2014    Procedure: DILATATION AND CURETTAGE /HYSTEROSCOPY;  Surgeon: [name], MD;  Location: [facility] ORS;  Service: Gynecology;   Laterality: N/A;  . Tee without cardioversion N/A 02/24/2015    Procedure: TRANSESOPHAGEAL ECHOCARDIOGRAM (TEE);  Surgeon: [name], MD;  Location: [facility];  Service: Cardiovascular;  Laterality: N/A;  . Electrophysiologic study N/A 02/24/2015    PVI and CTI ablation by Dr [name]    SOCIAL HISTORY: Social History   Social History  . Marital Status: Widowed    Spouse Name: N/A  . Number of Children: N/A  . Years of Education: N/A   Occupational History  . Not on file.   Social History Main Topics  . Smoking status: Former Smoker -- 1.00 packs/day for 2 years    Types: Cigarettes    Start date: 09/17/1962    Quit date: 11/16/1966  . Smokeless tobacco: Never Used  . Alcohol Use: No  . Drug Use: No  . Sexual Activity: Yes    Birth Control/ Protection: Post-menopausal   Other Topics Concern  . Not on file   Social History Narrative   Lives alone in [place]   Retired   Worked previously for [employer] and [employer]    FAMILY HISTORY: Family History  Problem Relation Age of Onset  . Heart disease Mother   . Stroke Father   . Cancer Father   . Early death Brother   . Cancer Sister   . Cancer Sister   . Cancer Sister   . Diabetes Daughter     Review of Systems  Constitutional: Negative for fever, chills, weight loss and malaise/fatigue.  HENT: Negative for congestion, hearing loss, nosebleeds, sore throat and tinnitus.   Eyes: Negative for blurred vision, double vision, pain and discharge.  Respiratory: Negative for cough, hemoptysis, sputum production, shortness of breath and wheezing.   Cardiovascular: Negative for chest pain, palpitations, claudication, leg swelling and PND.  Gastrointestinal: Negative for heartburn, nausea, vomiting, abdominal pain, diarrhea, constipation, blood in stool and melena.  Genitourinary: Negative for dysuria, urgency, frequency and hematuria.  Musculoskeletal: Negative for myalgias and falls. Positive for joint pain Ankle  stiffness. Ankle swelling. Skin: Negative for itching and rash.  Neurological: Negative for dizziness, tingling, tremors, sensory change, speech change, focal weakness, seizures, loss of consciousness, weakness and headaches.  Endo/Heme/Allergies: Does not bruise/bleed easily.  Psychiatric/Behavioral: Negative for depression, suicidal ideas, memory loss and substance abuse. The patient is not nervous/anxious and does not have insomnia.   14 point review of systems was performed and is negative except as detailed under history of present illness and above  PHYSICAL EXAMINATION  ECOG PERFORMANCE STATUS: 0 - Asymptomatic  Filed Vitals:   05/30/15 0827  BP: 121/73  Pulse: 83  Temp: 97.7 F (36.5 C)  Resp: 18    Physical Exam  Constitutional: She is oriented to person, place, and time and well-developed, well-nourished, and in no distress.  HENT:  Head: Normocephalic and atraumatic.  Nose: Nose normal.  Mouth/Throat: Oropharynx is clear and moist. No oropharyngeal  exudate.  Eyes: Conjunctivae and EOM are normal. Pupils are equal, round, and reactive to light. Right eye exhibits no discharge. Left eye exhibits no discharge. No scleral icterus.  Neck: Normal range of motion. Neck supple. No tracheal deviation present. No thyromegaly present.  Cardiovascular:   Exam reveals no gallop and no friction rub.   No murmur heard. S1/S2 audible and regular Pulmonary/Chest: Effort normal and breath sounds normal. She has no wheezes. She has no rales.  Abdominal: Soft. Bowel sounds are normal. She exhibits no distension and no mass. There is no tenderness. There is no rebound and no guarding.  Musculoskeletal: Normal range of motion. Ankle edema. 1+ bilateral Lymphadenopathy:    She has no cervical adenopathy.  Neurological: She is alert and oriented to person, place, and time. She has normal reflexes. No cranial nerve deficit. Gait normal. Coordination normal.  Skin: Skin is warm and dry. No rash  noted.  Psychiatric: Mood, memory, affect and judgment normal.  Nursing note and vitals reviewed.   LABORATORY DATA:  CBC    Component Value Date/Time   WBC 7.8 05/30/2015 0842   RBC 4.47 05/30/2015 0842   HGB 14.0 05/30/2015 0842   HCT 40.5 05/30/2015 0842   PLT 203 05/30/2015 0842   MCV 90.6 05/30/2015 0842   MCH 31.3 05/30/2015 0842   MCHC 34.6 05/30/2015 0842   RDW 12.6 05/30/2015 0842   LYMPHSABS 2.6 05/30/2015 0842   MONOABS 0.9 05/30/2015 0842   EOSABS 0.2 05/30/2015 0842   BASOSABS 0.0 05/30/2015 0842   CMP     Component Value Date/Time   NA 137 05/30/2015 0842   K 4.1 05/30/2015 0842   CL 107 05/30/2015 0842   CO2 24 05/30/2015 0842   GLUCOSE 117* 05/30/2015 0842   BUN 15 05/30/2015 0842   CREATININE 0.66 05/30/2015 0842   CALCIUM 8.9 05/30/2015 0842   PROT 6.7 05/30/2015 0842   ALBUMIN 4.0 05/30/2015 0842   AST 36 05/30/2015 0842   ALT 45 05/30/2015 0842   ALKPHOS 69 05/30/2015 0842   BILITOT 0.5 05/30/2015 0842   GFRNONAA >60 05/30/2015 0842   GFRAA >60 05/30/2015 0842    PATHOLOGY:L DIAGNOSIS Diagnosis Endometrium, curettage - FRAGMENTS OF BENIGN ENDOMETRIAL TYPE POLYP. - BENIGN ENDOCERVICAL AND SQUAMOUS MUCOSA. - VEGETABLE MATTER. - NO MALIGNANCY IDENTIFIED. - SEE COMMENT. Microscopic Comment There are fragments of benign endometrial type polyp. There are only a few scant fragments of inactive endometrium in the background. There is abundant vegetable matter, which may represent contamination if the patient does not have a known fistula. Clinical correlation is recommended. Vicente Males MD Pathologist, Electronic Signature (Case signed 12/31/2014)    ASSESSMENT and THERAPY PLAN:   History of ER+ bilateral breast cancer Intolerance to $RemoveBefore'20mg'[id number]$  Tamoxifen secondary to hot flashes Intolerance to aromasin secondary to hot flashes Abnormal endometrial lining, with hysteroscopy and D&C without significant pathology BCI prognostic; high risk  category, BCI predictive high likelihood of benefit from extended endocrine RX Osteopenia  She has been on Arimidex with fairly good tolerance. She has noted an increase in joint stiffness but does not feel it is limiting. She is taking calcium and vitamin D daily. We  discussed trying Femara. I advised her that Aromasin is preferred however she is unable to afford it. I have had some patients who switched between Arimidex and Femara with improvement in joint discomfort.  We addressed her DEXA that shows osteopenia. She is taking calcium and vitamin D. She is active. Once she has  completed all of her dental work we will can readdress a bisphosphonate or Prolia.  I will see her back in three months for reassessment and for a regular breast exam.  All questions were answered. The patient knows to call the clinic with any problems, questions or concerns. We can certainly see the patient much sooner if necessary.   This note was electronically signed.  This document serves as a record of services personally performed by Ancil Linsey, MD. It was created on her behalf by Arlyce Harman, a trained medical scribe. The creation of this record is based on the scribe's personal observations and the provider's statements to them. This document has been checked and approved by the attending provider.  I have reviewed the above documentation for accuracy and completeness, and I agree with the above.  Ancil Linsey, MD

## 2015-05-30 NOTE — Patient Instructions (Signed)
..  [place] at [facility] Discharge Instructions  RECOMMENDATIONS MADE BY THE CONSULTANT AND ANY TEST RESULTS WILL BE SENT TO YOUR REFERRING PHYSICIAN.  Exam today per Dr. Whitney Muse  Return in 3 months    Thank you for choosing [place] at [facility] to provide your oncology and hematology care.  To afford each patient quality time with our provider, please arrive at least 15 minutes before your scheduled appointment time.    You need to re-schedule your appointment should you arrive 10 or more minutes late.  We strive to give you quality time with our providers, and arriving late affects you and other patients whose appointments are after yours.  Also, if you no show three or more times for appointments you may be dismissed from the clinic at the providers discretion.     Again, thank you for choosing [facility].  Our hope is that these requests will decrease the amount of time that you wait before being seen by our physicians.       _____________________________________________________________  Should you have questions after your visit to [facility], please contact our office at [phone] between the hours of 8:30 a.m. and 4:30 p.m.  Voicemails left after 4:30 p.m. will not be returned until the following business day.  For prescription refill requests, have your pharmacy contact our office.

## 2015-06-13 ENCOUNTER — Telehealth (HOSPITAL_COMMUNITY): Payer: Self-pay | Admitting: *Deleted

## 2015-06-14 ENCOUNTER — Other Ambulatory Visit (HOSPITAL_COMMUNITY): Payer: Self-pay | Admitting: Oncology

## 2015-06-14 DIAGNOSIS — Z853 Personal history of malignant neoplasm of breast: Secondary | ICD-10-CM

## 2015-06-14 MED ORDER — LETROZOLE 2.5 MG PO TABS: 2.5000 mg | ORAL_TABLET | Freq: Every day | ORAL | Status: DC

## 2015-06-14 NOTE — Progress Notes (Signed)
Patient reports that she would like to try Femara and stop Arimidex due to joint pain.  New rx is sent to her pharmacy.  She is to switch ASAP.  Oncology history is updated.  KEFALAS,THOMAS 06/14/2015 5:44 PM

## 2015-06-14 NOTE — Telephone Encounter (Signed)
Femara E-scribed to her pharmacy.  She can stop Arimidex and switch to Femara when she gets the new medication.  Robynn Pane, PA-C 06/14/2015 5:46 PM

## 2015-06-22 ENCOUNTER — Telehealth: Payer: Self-pay

## 2015-06-22 NOTE — Telephone Encounter (Signed)
Patient called asking for Eliquis 5 mg samples.  I provided 4 boxes.

## 2015-07-19 ENCOUNTER — Telehealth: Payer: Self-pay | Admitting: Internal Medicine

## 2015-07-19 NOTE — Telephone Encounter (Signed)
Called pt to inform her that samples of Eliquis 5 mg, 14 day supply was left at the front desk and that she might want to apply for pt assist and if she has any other problems, questions or concerns to call the office. Pt verbalized understanding.

## 2015-07-19 NOTE — Telephone Encounter (Signed)
Patient calling the office for samples of medication:   1.  What medication and dosage are you requesting samples for? Eliquis 5 mg  2.  Are you currently out of this medication? Pt has enough for 2 or 3 days  3. Are you requesting samples to get you through until you receive your prescription? Pt stating she is in the donut hole.

## 2015-08-24 ENCOUNTER — Encounter: Payer: Self-pay | Admitting: Internal Medicine

## 2015-08-24 ENCOUNTER — Ambulatory Visit (INDEPENDENT_AMBULATORY_CARE_PROVIDER_SITE_OTHER): Payer: Medicare Other | Admitting: Internal Medicine

## 2015-08-24 ENCOUNTER — Ambulatory Visit (HOSPITAL_COMMUNITY)
Admission: AD | Admit: 2015-08-24 | Discharge: 2015-08-24 | Disposition: A | Discharge status: Home / Self Care | Payer: Medicare Other | Source: Ambulatory Visit | Attending: Internal Medicine | Admitting: Internal Medicine

## 2015-08-24 ENCOUNTER — Other Ambulatory Visit: Payer: Self-pay

## 2015-08-24 ENCOUNTER — Encounter (HOSPITAL_COMMUNITY)
Admission: AD | Disposition: A | Discharge status: Home / Self Care | Payer: Self-pay | Source: Ambulatory Visit | Attending: Internal Medicine

## 2015-08-24 ENCOUNTER — Other Ambulatory Visit: Payer: Self-pay | Admitting: Nurse Practitioner

## 2015-08-24 VITALS — BP 110/80 | HR 89 | Ht 63.0 in | Wt 194.0 lb

## 2015-08-24 DIAGNOSIS — I4891 Unspecified atrial fibrillation: Secondary | ICD-10-CM | POA: Diagnosis not present

## 2015-08-24 DIAGNOSIS — I480 Paroxysmal atrial fibrillation: Secondary | ICD-10-CM | POA: Diagnosis not present

## 2015-08-24 DIAGNOSIS — Z853 Personal history of malignant neoplasm of breast: Secondary | ICD-10-CM | POA: Diagnosis not present

## 2015-08-24 DIAGNOSIS — R002 Palpitations: Secondary | ICD-10-CM | POA: Insufficient documentation

## 2015-08-24 DIAGNOSIS — M8580 Other specified disorders of bone density and structure, unspecified site: Secondary | ICD-10-CM | POA: Insufficient documentation

## 2015-08-24 HISTORY — PX: EP IMPLANTABLE DEVICE: SHX172B

## 2015-08-24 SURGERY — LOOP RECORDER INSERTION
Anesthesia: LOCAL

## 2015-08-24 MED ORDER — LIDOCAINE-EPINEPHRINE 1 %-1:100000 IJ SOLN
INTRAMUSCULAR | Status: AC
  Filled 2015-08-24: qty 1

## 2015-08-24 SURGICAL SUPPLY — 2 items
LOOP REVEAL LINQSYS (Prosthesis & Implant Heart) ×1 IMPLANT
PACK LOOP INSERTION (CUSTOM PROCEDURE TRAY) ×2 IMPLANT

## 2015-08-24 NOTE — Progress Notes (Signed)
PCP: [name], MD Primary Cardiologist: Dr Doug Sou is a 69 y.o. female who presents today for routine electrophysiology followup.  Since her last visit, the patient reports doing very well.   She continues to have some palpitations though overall improved.  She thinks that she may have an "elevated heart rate" (150s) about twice per week.  She is unaware of triggers/precipitants.  Today, she denies symptoms of chest pain, shortness of breath,  lower extremity edema, dizziness, presyncope, or syncope.  The patient is otherwise without complaint today.   Past Medical History  Diagnosis Date  . Cancer of breast [facility]) July 2010    s/p XRT 2010 and again in 2011  . Glaucoma   . Cramps, muscle, general     severe breast cramping   . Night sweats   . Pain     breast pain  . Chronic coughing   . Sinus problem   . Wears glasses   . Glaucoma     left eye   . Joint pain     foot and knee pain  . Paroxysmal atrial fibrillation ([place]) 05/2014  . Dysrhythmia     atrial flutter  . Lymphedema of arm     left  . Osteopenia 01/24/2015  . Breast cancer [facility])    Past Surgical History  Procedure Laterality Date  . Tubal ligation  1984  . Colonoscopy N/A 02/05/2013    Procedure: COLONOSCOPY;  Surgeon: [name], MD;  [facility];  Service: Endoscopy;  Laterality: N/A;  830  . Cardioversion N/A 10/21/2014    Procedure: CARDIOVERSION;  Surgeon: [name], MD;  Location: [facility] ENDOSCOPY;  Service: Cardiovascular;  Laterality: N/A;  . Breast surgery  2010- right and 2011-left    for cancer  . Hysteroscopy w/d&c N/A 12/29/2014    Procedure: DILATATION AND CURETTAGE /HYSTEROSCOPY;  Surgeon: [name], MD;  Location: [facility] ORS;  Service: Gynecology;  Laterality: N/A;  . Tee without cardioversion N/A 02/24/2015    Procedure: TRANSESOPHAGEAL ECHOCARDIOGRAM (TEE);  Surgeon: [name], MD;  Location: [facility] ENDOSCOPY;  Service: Cardiovascular;  Laterality: N/A;  .  Electrophysiologic study N/A 02/24/2015    PVI and CTI ablation by Dr [name]    ROS- all systems are reviewed and negatives except as per HPI above  No current outpatient prescriptions on file.   No current facility-administered medications for this visit.    Physical Exam: Filed Vitals:   08/24/15 1541  BP: 110/80  Pulse: 89  Height: 5\' 3"  (1.6 m)  Weight: 194 lb (87.998 kg)    GEN- The patient is well appearing, alert and oriented x 3 today.   Head- normocephalic, atraumatic Eyes-  Sclera clear, conjunctiva pink Ears- hearing intact Oropharynx- clear Lungs- Clear to ausculation bilaterally, normal work of breathing Heart- Regular rate and rhythm, no murmurs, rubs or gallops, PMI not laterally displaced GI- soft, NT, ND, + BS Extremities- no clubbing, cyanosis, or edema  ekg today reveals sinus rhythm  Assessment and Plan:  1. AFib Doing well s/p ablation She continues to have palpitations of unclear significance. I would like to better characterize her palpitations.  I would therefore advise implantable loop recorder in order to guide management.   Risks, benefits, and alternatives to implantable loop recorder placement were discussed with the patient who wishes to proceed.  Return for wound check in 10 days. Return to see me in 6 weeks for additional afib management.  Today, I have  spent  25 minutes with the patient discussing atrial fibrillation management.  More than 50% of the visit time today was spent on this issue.    Thompson Grayer MD, [facility] 08/24/2015 4:43 PM

## 2015-08-24 NOTE — Interval H&P Note (Signed)
History and Physical Interval Note:  08/24/2015 5:29 PM  Heidi Lopez  has presented today for surgery, with the diagnosis of afib  The various methods of treatment have been discussed with the patient and family. After consideration of risks, benefits and other options for treatment, the patient has consented to  Procedure(s): Loop Recorder Insertion (N/A) as a surgical intervention .  The patient's history has been reviewed, patient examined, no change in status, stable for surgery.  I have reviewed the patient's chart and labs.  Questions were answered to the patient's satisfaction.     Thompson Grayer

## 2015-08-24 NOTE — H&P (View-Only) (Signed)
PCP: [name], MD Primary Cardiologist: Dr Doug Sou is a 69 y.o. female who presents today for routine electrophysiology followup.  Since her last visit, the patient reports doing very well.   She continues to have some palpitations though overall improved.  She thinks that she may have an "elevated heart rate" (150s) about twice per week.  She is unaware of triggers/precipitants.  Today, she denies symptoms of chest pain, shortness of breath,  lower extremity edema, dizziness, presyncope, or syncope.  The patient is otherwise without complaint today.   Past Medical History  Diagnosis Date  . Cancer of breast [facility]) July 2010    s/p XRT 2010 and again in 2011  . Glaucoma   . Cramps, muscle, general     severe breast cramping   . Night sweats   . Pain     breast pain  . Chronic coughing   . Sinus problem   . Wears glasses   . Glaucoma     left eye   . Joint pain     foot and knee pain  . Paroxysmal atrial fibrillation ([place]) 05/2014  . Dysrhythmia     atrial flutter  . Lymphedema of arm     left  . Osteopenia 01/24/2015  . Breast cancer [facility])    Past Surgical History  Procedure Laterality Date  . Tubal ligation  1984  . Colonoscopy N/A 02/05/2013    Procedure: COLONOSCOPY;  Surgeon: [name], MD;  [facility];  Service: Endoscopy;  Laterality: N/A;  830  . Cardioversion N/A 10/21/2014    Procedure: CARDIOVERSION;  Surgeon: [name], MD;  Location: [facility] ENDOSCOPY;  Service: Cardiovascular;  Laterality: N/A;  . Breast surgery  2010- right and 2011-left    for cancer  . Hysteroscopy w/d&c N/A 12/29/2014    Procedure: DILATATION AND CURETTAGE /HYSTEROSCOPY;  Surgeon: [name], MD;  Location: [facility] ORS;  Service: Gynecology;  Laterality: N/A;  . Tee without cardioversion N/A 02/24/2015    Procedure: TRANSESOPHAGEAL ECHOCARDIOGRAM (TEE);  Surgeon: [name], MD;  Location: [facility] ENDOSCOPY;  Service: Cardiovascular;  Laterality: N/A;  .  Electrophysiologic study N/A 02/24/2015    PVI and CTI ablation by Dr [name]    ROS- all systems are reviewed and negatives except as per HPI above  No current outpatient prescriptions on file.   No current facility-administered medications for this visit.    Physical Exam: Filed Vitals:   08/24/15 1541  BP: 110/80  Pulse: 89  Height: 5\' 3"  (1.6 m)  Weight: 194 lb (87.998 kg)    GEN- The patient is well appearing, alert and oriented x 3 today.   Head- normocephalic, atraumatic Eyes-  Sclera clear, conjunctiva pink Ears- hearing intact Oropharynx- clear Lungs- Clear to ausculation bilaterally, normal work of breathing Heart- Regular rate and rhythm, no murmurs, rubs or gallops, PMI not laterally displaced GI- soft, NT, ND, + BS Extremities- no clubbing, cyanosis, or edema  ekg today reveals sinus rhythm  Assessment and Plan:  1. AFib Doing well s/p ablation She continues to have palpitations of unclear significance. I would like to better characterize her palpitations.  I would therefore advise implantable loop recorder in order to guide management.   Risks, benefits, and alternatives to implantable loop recorder placement were discussed with the patient who wishes to proceed.  Return for wound check in 10 days. Return to see me in 6 weeks for additional afib management.  Today, I have  spent  25 minutes with the patient discussing atrial fibrillation management.  More than 50% of the visit time today was spent on this issue.    Thompson Grayer MD, [facility] 08/24/2015 4:43 PM

## 2015-08-24 NOTE — Patient Instructions (Addendum)
Medication Instructions:  Your physician recommends that you continue on your current medications as directed. Please refer to the Current Medication list given to you today.   Labwork: None ordered   Testing/Procedures: LINQ Implant today---check in at the [employer] at [place]: Your physician recommends that you schedule a follow-up appointment in: 7-10 days in the device clinic for wound check and 3 months with D Allred   Any Other Special Instructions Will Be Listed Below (If Applicable).     If you need a refill on your cardiac medications before your next appointment, please call your pharmacy.

## 2015-08-25 ENCOUNTER — Encounter (HOSPITAL_COMMUNITY): Payer: Self-pay | Admitting: Internal Medicine

## 2015-08-26 MED FILL — Lidocaine Inj 1% w/ Epinephrine-1:100000: INTRAMUSCULAR | Qty: 20 | Status: AC

## 2015-08-29 ENCOUNTER — Ambulatory Visit (HOSPITAL_COMMUNITY): Payer: Medicare Other | Admitting: Hematology & Oncology

## 2015-08-29 ENCOUNTER — Encounter: Payer: Self-pay | Admitting: Internal Medicine

## 2015-09-02 ENCOUNTER — Telehealth: Payer: Self-pay | Admitting: *Deleted

## 2015-09-02 NOTE — Telephone Encounter (Signed)
[facility] requesting call back.  Per Dr. [name], due to AF w/RVR on Care[employer] transmissions, need to increase Cardizem to 240mg  BID.

## 2015-09-05 ENCOUNTER — Ambulatory Visit (INDEPENDENT_AMBULATORY_CARE_PROVIDER_SITE_OTHER): Payer: Medicare Other | Admitting: *Deleted

## 2015-09-05 ENCOUNTER — Encounter: Payer: Self-pay | Admitting: Internal Medicine

## 2015-09-05 DIAGNOSIS — I4891 Unspecified atrial fibrillation: Secondary | ICD-10-CM

## 2015-09-05 LAB — CUP PACEART INCLINIC DEVICE CHECK: Date Time Interrogation Session: 20161219140652

## 2015-09-05 NOTE — Progress Notes (Signed)
ILR Wound check appointment. Steri-strips removed prior to arrival. Wound without redness or edema. Incision edges approximated, wound well healed. Pt with 22 tachy episodes; 0 brady episodes; 0 asystole. 9.2% AT/AF + eliquis. 4 symptom episodes---pt described as fluttering sensation like a "galloping horse." Monthly summary reports & ROV w/ JA 11/25/15.

## 2015-09-06 NOTE — Telephone Encounter (Signed)
Called patient regarding symptom episodes on [facility] transmissions.  Patient reports that she feels herself go out of rhythm and marks it with her symptom activator.  She is concerned that her HR in AF is faster now than it was prior to her AF ablation.  Advised patient to increase diltiazem to 240mg  twice daily per Dr. [name], but patient states she is unable to do this as increased diltiazem doses cause her ankles to painfully swell.  She states that she urinates "all the time" and taking her PRN furosemide does not help with her ankle swelling, even if taken daily.  Patient states that she used to take diltiazem 300mg , but was unable to tolerate this dose and is thus hesitant to take diltiazem 240mg  BID.  Patient is agreeable to AF clinic appointment per Dr. Jackalyn Lombard recommendation.  Advised patient that I will make Dr. [name] aware of her concerns regarding an increased diltiazem dose and that I will make the AF clinic aware that she needs to schedule an appointment.  Advised her to continue taking diltiazem 240mg  daily for now.  Also advised that she reduce her sodium intake and elevate her feet/ankles whenever she can to reduce her ankle swelling.  Patient is appreciative and agreeable to this plan.  She denies any additional questions or concerns at this time, and is aware to call with worsening symptoms.

## 2015-09-20 ENCOUNTER — Ambulatory Visit (HOSPITAL_COMMUNITY)
Admission: RE | Admit: 2015-09-20 | Discharge: 2015-09-20 | Disposition: A | Discharge status: Home / Self Care | Payer: Medicare Other | Source: Ambulatory Visit | Attending: Nurse Practitioner | Admitting: Nurse Practitioner

## 2015-09-20 ENCOUNTER — Encounter (HOSPITAL_COMMUNITY): Payer: Self-pay | Admitting: Nurse Practitioner

## 2015-09-20 VITALS — BP 130/84 | HR 167 | Ht 63.0 in | Wt 191.8 lb

## 2015-09-20 DIAGNOSIS — I471 Supraventricular tachycardia: Secondary | ICD-10-CM

## 2015-09-20 MED ORDER — APIXABAN 5 MG PO TABS: 5.0000 mg | ORAL_TABLET | Freq: Two times a day (BID) | ORAL | Status: DC

## 2015-09-20 MED ORDER — DILTIAZEM HCL ER COATED BEADS 240 MG PO CP24: 240.0000 mg | ORAL_CAPSULE | Freq: Every day | ORAL | Status: DC

## 2015-09-20 MED ORDER — DILTIAZEM HCL 30 MG PO TABS: ORAL_TABLET | ORAL | Status: DC

## 2015-09-20 NOTE — Patient Instructions (Signed)
Your physician has recommended you make the following change in your medication:  1)Cardizem 30mg  -- take 1 tablet every 4 hours AS NEEDED for heart rate >100 as long as blood pressure >100.   Butch Penny will be in touch regarding plan.

## 2015-09-20 NOTE — Progress Notes (Signed)
Patient ID: Heidi Lopez, female   DOB: [birth year], 70 y.o.   MRN: [id number]     Primary Care Physician: [name], MD Referring Physician: Dr. Melburn Hake Heidi Lopez is a 70 y.o. female with a h/o paf s/p ablation 6/16. She recently saw Dr. [name] and described HR's in the 150 range at least 2 times a week. She had a linq monitor placed which showed 22 episodes of afib/atach/tachy. She was told to increase cardizem to 240 mg bid and make an appointment in afib clinic for f/u. She reports that she did not increase her cardizem due to higher doses causing pedal edema. For the most part of these episodes, pt is asymptomatic. She appears to be in an atypical aflutter today with v rate of 168 but does not feel it and states she feels well. She has not tolerated flecainide, digoxin or metoprolol in the past.  Today, she denies symptoms of palpitations, chest pain, shortness of breath, orthopnea, PND, lower extremity edema, dizziness, presyncope, syncope, or neurologic sequela. The patient is tolerating medications without difficulties and is otherwise without complaint today.   Past Medical History  Diagnosis Date  . Cancer of breast [facility]) July 2010    s/p XRT 2010 and again in 2011  . Glaucoma   . Cramps, muscle, general     severe breast cramping   . Night sweats   . Pain     breast pain  . Chronic coughing   . Sinus problem   . Wears glasses   . Glaucoma     left eye   . Joint pain     foot and knee pain  . Paroxysmal atrial fibrillation ([place]) 05/2014  . Dysrhythmia     atrial flutter  . Lymphedema of arm     left  . Osteopenia 01/24/2015  . Breast cancer [facility])    Past Surgical History  Procedure Laterality Date  . Tubal ligation  1984  . Colonoscopy N/A 02/05/2013    Procedure: COLONOSCOPY;  Surgeon: [name], MD;  Location: [facility] ENDO SUITE;  Service: Endoscopy;  Laterality: N/A;  830  . Cardioversion N/A 10/21/2014    Procedure: CARDIOVERSION;  Surgeon: [name],  MD;  Location: [facility] ENDOSCOPY;  Service: Cardiovascular;  Laterality: N/A;  . Breast surgery  2010- right and 2011-left    for cancer  . Hysteroscopy w/d&c N/A 12/29/2014    Procedure: DILATATION AND CURETTAGE /HYSTEROSCOPY;  Surgeon: [name], MD;  Location: [facility] ORS;  Service: Gynecology;  Laterality: N/A;  . Tee without cardioversion N/A 02/24/2015    Procedure: TRANSESOPHAGEAL ECHOCARDIOGRAM (TEE);  Surgeon: [name], MD;  Location: [facility] ENDOSCOPY;  Service: Cardiovascular;  Laterality: N/A;  . Electrophysiologic study N/A 02/24/2015    PVI and CTI ablation by Dr [name]  . Ep implantable device N/A 08/24/2015    Procedure: Loop Recorder Insertion;  Surgeon: Thompson Grayer, MD;  Location: [place] CV LAB;  Service: Cardiovascular;  Laterality: N/A;    Current Outpatient Prescriptions  Medication Sig Dispense Refill  . albuterol (PROVENTIL HFA;VENTOLIN HFA) 108 (90 BASE) MCG/ACT inhaler Inhale into the lungs every 6 (six) hours as needed for wheezing or shortness of breath. Reported on 09/05/2015    . apixaban (ELIQUIS) 5 MG TABS tablet Take 1 tablet (5 mg total) by mouth 2 (two) times daily. 180 tablet 3  . brimonidine (ALPHAGAN) 0.2 % ophthalmic solution Place 1 drop into the left eye 2 (two) times daily.     Marland Kitchen  Calcium Carbonate-Vit D-Min (CALTRATE 600+D PLUS PO) Take 1 tablet by mouth daily.    . Cholecalciferol (VITAMIN D PO) Take 2,000 Units by mouth daily.     . Coenzyme Q10 (COQ-10) 200 MG CAPS Take 1 capsule by mouth daily.    . Cranberry 500 MG CAPS Take 500 mg by mouth 2 (two) times daily.     Marland Kitchen diltiazem (CARDIZEM CD) 240 MG 24 hr capsule Take 1 capsule (240 mg total) by mouth daily. 90 capsule 3  . letrozole (FEMARA) 2.5 MG tablet Take 1 tablet (2.5 mg total) by mouth daily. 30 tablet 2  . LUMIGAN 0.01 % SOLN Place 1 drop into both eyes at bedtime.    . Multiple Vitamin (MULTIVITAMIN) tablet Take 1 tablet by mouth daily.     . Omega-3 Fatty Acids (FISH OIL) 1200 MG CAPS Take 1  capsule by mouth daily.    Marland Kitchen diltiazem (CARDIZEM) 30 MG tablet Cardizem 30mg  -- take 1 tablet every 4 hours AS NEEDED for heart rate >100 as long as blood pressure >100. 45 tablet 1  . furosemide (LASIX) 20 MG tablet Take 1 tablet (20 mg total) by mouth daily as needed. Take daily as needed for swelling (Patient not taking: Reported on 09/20/2015) 90 tablet 3  . loratadine (CLARITIN) 10 MG tablet Take 10 mg by mouth daily as needed. Reported on 09/20/2015     No current facility-administered medications for this encounter.    Allergies  Allergen Reactions  . Metoprolol     Makes her feel bad  . Reglan [Metoclopramide] Other (See Comments)    Causes severe jitters    Social History   Social History  . Marital Status: Widowed    Spouse Name: N/A  . Number of Children: N/A  . Years of Education: N/A   Occupational History  . Not on file.   Social History Main Topics  . Smoking status: Former Smoker -- 1.00 packs/day for 2 years    Types: Cigarettes    Start date: 09/17/1962    Quit date: 11/16/1966  . Smokeless tobacco: Never Used  . Alcohol Use: No  . Drug Use: No  . Sexual Activity: Yes    Birth Control/ Protection: Post-menopausal   Other Topics Concern  . Not on file   Social History Narrative   Lives alone in [place]   Retired   Worked previously for [employer] and [employer]    Family History  Problem Relation Age of Onset  . Heart disease Mother   . Stroke Father   . Cancer Father   . Early death Brother   . Cancer Sister   . Cancer Sister   . Cancer Sister   . Diabetes Daughter     ROS- All systems are reviewed and negative except as per the HPI above  Physical Exam: Filed Vitals:   09/20/15 1534  BP: 130/84  Pulse: 167  Height: 5\' 3"  (1.6 m)  Weight: 191 lb 12.8 oz (87 kg)    GEN- The patient is well appearing, alert and oriented x 3 today.   Head- normocephalic, atraumatic Eyes-  Sclera clear, conjunctiva pink Ears- hearing  intact Oropharynx- clear Neck- supple, no JVP Lymph- no cervical lymphadenopathy Lungs- Clear to ausculation bilaterally, normal work of breathing Heart- Regular rate and rhythm, no murmurs, rubs or gallops, PMI not laterally displaced GI- soft, NT, ND, + BS Extremities- no clubbing, cyanosis, or edema MS- no significant deformity or atrophy Skin- no rash or lesion  Psych- euthymic mood, full affect Neuro- strength and sensation are intact  EKG-Sinus tach at 168 bpm, pr int 114 ms, qrs int 98 ms, qtc 461 ms Epic records reviewed  Assessment and Plan: 1. Paroxsymal tachycardia episodes , mostly asymptomatic  Currently has rvr but is totally asymptomatic,  was surprised to hear heart  Is racing S/p ablation S/p linq Has failed flecainide, dig Does not want amiodarone Does not tolerate BB's Does not want to increase CCB due to pedal edema For now will give 30 mg cardizem to use pill in pocket for episodes and will discuss wit Dr. [name] for best approach. May need repeat ablationor tikosyn.  Geroge Baseman Elaina Cara, [facility] [address] [place], [place] [address] [phone]

## 2015-09-21 ENCOUNTER — Encounter (HOSPITAL_COMMUNITY): Payer: Self-pay | Admitting: Hematology & Oncology

## 2015-09-21 ENCOUNTER — Telehealth (HOSPITAL_COMMUNITY): Payer: Self-pay | Admitting: *Deleted

## 2015-09-21 ENCOUNTER — Encounter (HOSPITAL_COMMUNITY): Payer: Medicare Other | Attending: Hematology & Oncology | Admitting: Hematology & Oncology

## 2015-09-21 VITALS — BP 121/75 | HR 87 | Temp 98.3°F | Resp 16 | Wt 193.2 lb

## 2015-09-21 DIAGNOSIS — C50911 Malignant neoplasm of unspecified site of right female breast: Secondary | ICD-10-CM

## 2015-09-21 DIAGNOSIS — C50912 Malignant neoplasm of unspecified site of left female breast: Secondary | ICD-10-CM

## 2015-09-21 DIAGNOSIS — Z853 Personal history of malignant neoplasm of breast: Secondary | ICD-10-CM

## 2015-09-21 DIAGNOSIS — M2550 Pain in unspecified joint: Secondary | ICD-10-CM | POA: Diagnosis not present

## 2015-09-21 DIAGNOSIS — M8580 Other specified disorders of bone density and structure, unspecified site: Secondary | ICD-10-CM | POA: Diagnosis not present

## 2015-09-21 MED ORDER — LETROZOLE 2.5 MG PO TABS: 2.5000 mg | ORAL_TABLET | Freq: Every day | ORAL | Status: DC

## 2015-09-21 NOTE — Patient Instructions (Signed)
[place] at [facility] Discharge Instructions  RECOMMENDATIONS MADE BY THE CONSULTANT AND ANY TEST RESULTS WILL BE SENT TO YOUR REFERRING PHYSICIAN.  The joint pain and aching shouldn't worsen, please call us if it worsens.   We refilled your Femara, please pick this up at your pharmacy.   Return in 4 months for MD appointment. Mammogram in August.   Letrozole (Femara) tablets What is this medicine? LETROZOLE (LET roe zole) blocks the production of estrogen. Certain types of breast cancer grow under the influence of estrogen. Letrozole helps block tumor growth. This medicine is used to treat advanced breast cancer in postmenopausal women. This medicine may be used for other purposes; ask your health care provider or pharmacist if you have questions.  What should I tell my health care provider before I take this medicine? They need to know if you have any of these conditions: -liver disease -osteoporosis (weak bones) -an unusual or allergic reaction to letrozole, other medicines, foods, dyes, or preservatives -pregnant or trying to get pregnant -breast-feeding  How should I use this medicine? Take this medicine by mouth with a glass of water. You may take it with or without food. Follow the directions on the prescription label. Take your medicine at regular intervals. Do not take your medicine more often than directed. Do not stop taking except on your doctor's advice. Talk to your pediatrician regarding the use of this medicine in children. Special care may be needed. Overdosage: If you think you have taken too much of this medicine contact a poison control center or emergency room at once. NOTE: This medicine is only for you. Do not share this medicine with others.  What if I miss a dose? If you miss a dose, take it as soon as you can. If it is almost time for your next dose, take only that dose. Do not take double or extra doses.  What may interact with this  medicine? Do not take this medicine with any of the following medications: -estrogens, like hormone replacement therapy or birth control pills This medicine may also interact with the following medications: -dietary supplements such as androstenedione or DHEA -prasterone -tamoxifen This list may not describe all possible interactions. Give your health care provider a list of all the medicines, herbs, non-prescription drugs, or dietary supplements you use. Also tell them if you smoke, drink alcohol, or use illegal drugs. Some items may interact with your medicine.  What should I watch for while using this medicine? Visit your doctor or health care professional for regular check-ups to monitor your condition. Do not use this drug if you are pregnant. Serious side effects to an unborn child are possible. Talk to your doctor or pharmacist for more information. You may get drowsy or dizzy. Do not drive, use machinery, or do anything that needs mental alertness until you know how this medicine affects you. Do not stand or sit up quickly, especially if you are an older patient. This reduces the risk of dizzy or fainting spells.  What side effects may I notice from receiving this medicine? Side effects that you should report to your doctor or health care professional as soon as possible: -allergic reactions like skin rash, itching, or hives -bone fracture -chest pain -difficulty breathing or shortness of breath -severe pain, swelling, warmth in the leg -unusually weak or tired -vaginal bleeding Side effects that usually do not require medical attention (report to your doctor or health care professional if they continue  or are bothersome): -bone, back, joint, or muscle pain -dizziness -fatigue -fluid retention -headache -hot flashes, night sweats -nausea -weight gain This list may not describe all possible side effects. Call your doctor for medical advice about side effects. You may report  side effects to FDA at 1-800-FDA-1088.  Where should I keep my medicine? Keep out of the reach of children. Store between 15 and 30 degrees C (59 and 86 degrees F). Throw away any unused medicine after the expiration date. NOTE: This sheet is a summary. It may not cover all possible information. If you have questions about this medicine, talk to your doctor, pharmacist, or health care provider.    2016, Elsevier/Gold Standard. (2007-11-14 16:43:44)   Thank you for choosing [place] at [facility] to provide your oncology and hematology care.  To afford each patient quality time with our provider, please arrive at least 15 minutes before your scheduled appointment time.    You need to re-schedule your appointment should you arrive 10 or more minutes late.  We strive to give you quality time with our providers, and arriving late affects you and other patients whose appointments are after yours.  Also, if you no show three or more times for appointments you may be dismissed from the clinic at the providers discretion.     Again, thank you for choosing [facility].  Our hope is that these requests will decrease the amount of time that you wait before being seen by our physicians.       _____________________________________________________________  Should you have questions after your visit to [facility], please contact our office at [phone] between the hours of 8:30 a.m. and 4:30 p.m.  Voicemails left after 4:30 p.m. will not be returned until the following business day.  For prescription refill requests, have your pharmacy contact our office.

## 2015-09-21 NOTE — Progress Notes (Signed)
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 but for negative sentinel nodes. Estrogen receptor +95% progesterone receptor +96% Ki-67 marker low at 9% HER-2/neu not amplified status post lumpectomy followed by radiation therapy. Her surgery was 04/26/2010 and she started tamoxifen on 08/16/2010 after radiation therapy finished on 07/21/2010. She is having tremendous sweating and hot flashes .   Tamoxifen 10 mg daily started by Dr. 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 Estrogen receptors were 96% positive, progesterone  33% positive Ki-67 marker low 8% HER-2/neu negative and she did not receive any hormonal therapy after the radiation on the right side.  DEXA on 06/30/2012 with osteopenia but a noted INCREASE in BMD   Breast Cancer Index with high likelihood of benefit from extended endocrine therapy. BCI prognostic high risk category.  Arimidex   CURRENT THERAPY: Arimidex  INTERVAL HISTORY: Heidi Lopez 69 y.o. female returns for follow-up of bilateral breast cancers.   Heidi Lopez returns to the [place] alone today. She says that the holidays were great and that she's been doing pretty good.  She says that they recently "put a loop thing in" to her heart to monitor her heart rate, and says that yesterday it was running about 168 all day long. She went to the afib clinic yesterday, and they are increasing her medication to see if that will help slow down her heart rate. She says sometimes she is in afib, and sometimes it's aflutter.  In terms of her dental issues, she just had another tooth pulled last week. She states that before she had the cancer and radiation, she had all of her teeth;  but now she is having all of these issues and having to have several teeth pulled. She is unsure as to when she is returning to see her dentist again.   She says she doesn't know whether it's the medication she's on, femara, but her bones and joints hurt a lot. She was reminded that this is a common side effect of femara/armidex. She says she thinks that her symptoms were worse on the arimidex than they are now on the femara. She says that, back on the arimidex, even her hip joints ached while she was walking, but that now that she's on the femara, as long as she remembers to take her calcium and vitamin D, she's much better. She continues to deny that her symptoms are making her miserable, but says that she can just "really tell" if she forgets to take her vitamins. If she's been sitting for a while, it's hard to get back going, and that she will roll her ankles before she stands to help alleviate soreness.  She confirms that, in spite of her symptoms, she can get everything done that she needs to do during the day. She says she is not miserable, but she aches from head to toe where she used to have no pain at all. Heidi Lopez comments that the tamoxifen didn't make her have aches but drenching hot flashes, and was reminded that the drugs femara and tamoxifen have different side effect profiles.  She's good on her mammogram and is taking measures to follow-up with care of her heart. She repeats  every day is a blessing, "I really do believe that."  She denies belly pain, problems with her stool, problems when she urinates, and largely denies any other complaints in general.  Heidi Lopez continues to experience minor swelling around her ankles. She states that if she takes the lasix regularly, it "cramps her bladder."  In general, she says she feels good, other than "just the stiffness."  She needs a refill on her femara today.   MEDICAL HISTORY: Past Medical History  Diagnosis Date  . Cancer of  breast [facility]) July 2010    s/p XRT 2010 and again in 2011  . Glaucoma   . Cramps, muscle, general     severe breast cramping   . Night sweats   . Pain     breast pain  . Chronic coughing   . Sinus problem   . Wears glasses   . Glaucoma     left eye   . Joint pain     foot and knee pain  . Paroxysmal atrial fibrillation ([place]) 05/2014  . Dysrhythmia     atrial flutter  . Lymphedema of arm     left  . Osteopenia 01/24/2015  . Breast cancer [facility])     has History of breast cancer, bilateral: Right T1N0 Lumpectomy July 2010, Left T1c, N0 Lumpectomy 04/26/2010.; Pain in joint, ankle and foot; A-fib ([place]); Snoring; Atypical glandular cells of undetermined significance (AGUS) on cervical Pap smear; Thickened endometrium; Use of tamoxifen (Nolvadex); Osteopenia; and Atrial flutter ([place]) on her problem list.     is allergic to metoprolol and reglan.  Heidi Lopez does not currently have medications on file.  SURGICAL HISTORY: Past Surgical History  Procedure Laterality Date  . Tubal ligation  1984  . Colonoscopy N/A 02/05/2013    Procedure: COLONOSCOPY;  Surgeon: [name], MD;  [facility];  Service: Endoscopy;  Laterality: N/A;  830  . Cardioversion N/A 10/21/2014    Procedure: CARDIOVERSION;  Surgeon: [name], MD;  Location: [facility] ENDOSCOPY;  Service: Cardiovascular;  Laterality: N/A;  . Breast surgery  2010- right and 2011-left    for cancer  . Hysteroscopy w/d&c N/A 12/29/2014    Procedure: DILATATION AND CURETTAGE /HYSTEROSCOPY;  Surgeon: [name], MD;  Location: [facility] ORS;  Service: Gynecology;  Laterality: N/A;  . Tee without cardioversion N/A 02/24/2015    Procedure: TRANSESOPHAGEAL ECHOCARDIOGRAM (TEE);  Surgeon: [name], MD;  Location: [facility] ENDOSCOPY;  Service: Cardiovascular;  Laterality: N/A;  . Electrophysiologic study N/A 02/24/2015    PVI and CTI ablation by Dr [name]  . Ep implantable device N/A 08/24/2015    Procedure: Loop Recorder Insertion;   Surgeon: Thompson Grayer, MD;  Location: [place] CV LAB;  Service: Cardiovascular;  Laterality: N/A;    SOCIAL HISTORY: Social History   Social History  . Marital Status: Widowed    Spouse Name: N/A  . Number of Children: N/A  . Years of Education: N/A   Occupational History  . Not on file.   Social History Main Topics  . Smoking status: Former Smoker -- 1.00 packs/day for 2 years    Types: Cigarettes    Start date: 09/17/1962    Quit date: 11/16/1966  . Smokeless tobacco: Never Used  . Alcohol Use: No  . Drug Use: No  . Sexual Activity: Yes    Birth Control/ Protection: Post-menopausal   Other Topics Concern  . Not on file   Social History Narrative  Lives alone in [place]   Retired   Worked previously for [employer] and [employer]    FAMILY HISTORY: Family History  Problem Relation Age of Onset  . Heart disease Mother   . Stroke Father   . Cancer Father   . Early death Brother   . Cancer Sister   . Cancer Sister   . Cancer Sister   . Diabetes Daughter     Review of Systems  Constitutional: Negative for fever, chills, weight loss and malaise/fatigue.  HENT: Negative for congestion, hearing loss, nosebleeds, sore throat and tinnitus.   Eyes: Negative for blurred vision, double vision, pain and discharge.  Respiratory: Negative for cough, hemoptysis, sputum production, shortness of breath and wheezing.   Cardiovascular: Negative for chest pain, palpitations, claudication, leg swelling and PND.  Gastrointestinal: Negative for heartburn, nausea, vomiting, abdominal pain, diarrhea, constipation, blood in stool and melena.  Genitourinary: Negative for dysuria, urgency, frequency and hematuria.  Musculoskeletal: Negative for myalgias and falls. Positive for joint pain Ankle stiffness. Ankle swelling. Skin: Negative for itching and rash.  Neurological: Negative for dizziness, tingling, tremors, sensory change, speech change, focal weakness, seizures, loss of  consciousness, weakness and headaches.  Endo/Heme/Allergies: Does not bruise/bleed easily.  Psychiatric/Behavioral: Negative for depression, suicidal ideas, memory loss and substance abuse. The patient is not nervous/anxious and does not have insomnia.   14 point review of systems was performed and is negative except as detailed under history of present illness and above   PHYSICAL EXAMINATION  ECOG PERFORMANCE STATUS: 0 - Asymptomatic  Filed Vitals:   09/21/15 0915  BP: 121/75  Pulse: 87  Temp: 98.3 F (36.8 C)  Resp: 16    Physical Exam  Constitutional: She is oriented to person, place, and time and well-developed, well-nourished, and in no distress.  HENT:  Head: Normocephalic and atraumatic.  Nose: Nose normal.  Mouth/Throat: Oropharynx is clear and moist. No oropharyngeal exudate.  Eyes: Conjunctivae and EOM are normal. Pupils are equal, round, and reactive to light. Right eye exhibits no discharge. Left eye exhibits no discharge. No scleral icterus.  Neck: Normal range of motion. Neck supple. No tracheal deviation present. No thyromegaly present.  Cardiovascular:   Exam reveals no gallop and no friction rub.   No murmur heard. S1/S2 audible and regular Pulmonary/Chest: Effort normal and breath sounds normal. She has no wheezes. She has no rales.  Abdominal: Soft. Bowel sounds are normal. She exhibits no distension and no mass. There is no tenderness. There is no rebound and no guarding.  Musculoskeletal: Normal range of motion. Ankle edema. 1+ bilateral Lymphadenopathy:    She has no cervical adenopathy.  Neurological: She is alert and oriented to person, place, and time. She has normal reflexes. No cranial nerve deficit. Gait normal. Coordination normal.  Skin: Skin is warm and dry. No rash noted.  Psychiatric: Mood, memory, affect and judgment normal.  Nursing note and vitals reviewed.   LABORATORY DATA: I have reviewed the data as listed  CBC    Component Value  Date/Time   WBC 7.8 05/30/2015 0842   RBC 4.47 05/30/2015 0842   HGB 14.0 05/30/2015 0842   HCT 40.5 05/30/2015 0842   PLT 203 05/30/2015 0842   MCV 90.6 05/30/2015 0842   MCH 31.3 05/30/2015 0842   MCHC 34.6 05/30/2015 0842   RDW 12.6 05/30/2015 0842   LYMPHSABS 2.6 05/30/2015 0842   MONOABS 0.9 05/30/2015 0842   EOSABS 0.2 05/30/2015 0842   BASOSABS 0.0 05/30/2015 3568  CMP     Component Value Date/Time   NA 137 05/30/2015 0842   K 4.1 05/30/2015 0842   CL 107 05/30/2015 0842   CO2 24 05/30/2015 0842   GLUCOSE 117* 05/30/2015 0842   BUN 15 05/30/2015 0842   CREATININE 0.66 05/30/2015 0842   CALCIUM 8.9 05/30/2015 0842   PROT 6.7 05/30/2015 0842   ALBUMIN 4.0 05/30/2015 0842   AST 36 05/30/2015 0842   ALT 45 05/30/2015 0842   ALKPHOS 69 05/30/2015 0842   BILITOT 0.5 05/30/2015 0842   GFRNONAA >60 05/30/2015 0842   GFRAA >60 05/30/2015 0842    PATHOLOGY:L DIAGNOSIS Diagnosis Endometrium, curettage - FRAGMENTS OF BENIGN ENDOMETRIAL TYPE POLYP. - BENIGN ENDOCERVICAL AND SQUAMOUS MUCOSA. - VEGETABLE MATTER. - NO MALIGNANCY IDENTIFIED. - SEE COMMENT. Microscopic Comment There are fragments of benign endometrial type polyp. There are only a few scant fragments of inactive endometrium in the background. There is abundant vegetable matter, which may represent contamination if the patient does not have a known fistula. Clinical correlation is recommended. Vicente Males MD Pathologist, Electronic Signature (Case signed 12/31/2014)    ASSESSMENT and THERAPY PLAN:   History of ER+ bilateral breast cancer Intolerance to '20mg'$  Tamoxifen secondary to hot flashes Intolerance to aromasin secondary to hot flashes Abnormal endometrial lining, with hysteroscopy and D&C without significant pathology BCI prognostic; high risk category, BCI predictive high likelihood of benefit from extended endocrine RX Osteopenia, on calcium and vitamin D Ongoing Dental Issues Arimidex Joint  pain, tolerable  She has been on Femara with fairly good tolerance. She has noted an increase in joint stiffness but does not feel it is limiting. She is taking calcium and vitamin D daily. She feels better on femara than on arimidex and would like to continue it. It was refilled today.   We addressed her DEXA that shows osteopenia. She is taking calcium and vitamin D. She is active. Once she has completed all of her dental work we will can readdress a bisphosphonate or Prolia.  I will see her back in three months for reassessment and for a regular breast exam. She will be due for a mammogram in August.  She will go home today with some reading material on femara, to refresh her knowledge of the side-effect profile and her journey on the drug.  All questions were answered. The patient knows to call the clinic with any problems, questions or concerns. We can certainly see the patient much sooner if necessary.   This note was electronically signed.  This document serves as a record of services personally performed by Ancil Linsey, MD. It was created on her behalf by Toni Amend, a trained medical scribe. The creation of this record is based on the scribe's personal observations and the provider's statements to them. This document has been checked and approved by the attending provider.  I have reviewed the above documentation for accuracy and completeness, and I agree with the above.  Ancil Linsey, MD

## 2015-09-21 NOTE — Telephone Encounter (Signed)
Notified pt of Dr. Jackalyn Lombard recommendations to use PRN cardizem as prescribed and follow up with him to discuss options further in a month.  Scheduler notified of appointment need and patient agreeable.

## 2015-09-26 ENCOUNTER — Ambulatory Visit (INDEPENDENT_AMBULATORY_CARE_PROVIDER_SITE_OTHER): Payer: Medicare Other | Admitting: *Deleted

## 2015-09-26 DIAGNOSIS — I480 Paroxysmal atrial fibrillation: Secondary | ICD-10-CM | POA: Diagnosis not present

## 2015-10-03 NOTE — Progress Notes (Signed)
Carelink Summary Report / Loop Recorder 

## 2015-10-17 ENCOUNTER — Encounter: Payer: Self-pay | Admitting: Internal Medicine

## 2015-10-17 ENCOUNTER — Ambulatory Visit (INDEPENDENT_AMBULATORY_CARE_PROVIDER_SITE_OTHER): Payer: Medicare Other | Admitting: Internal Medicine

## 2015-10-17 VITALS — BP 132/84 | HR 78 | Ht 63.0 in | Wt 196.8 lb

## 2015-10-17 DIAGNOSIS — I484 Atypical atrial flutter: Secondary | ICD-10-CM | POA: Diagnosis not present

## 2015-10-17 DIAGNOSIS — I480 Paroxysmal atrial fibrillation: Secondary | ICD-10-CM | POA: Diagnosis not present

## 2015-10-17 DIAGNOSIS — I4891 Unspecified atrial fibrillation: Secondary | ICD-10-CM | POA: Diagnosis not present

## 2015-10-17 LAB — BASIC METABOLIC PANEL
BUN: 13 mg/dL (ref 7–25)
CHLORIDE: 104 mmol/L (ref 98–110)
CO2: 25 mmol/L (ref 20–31)
Calcium: 9.7 mg/dL (ref 8.6–10.4)
Creat: 0.69 mg/dL (ref 0.60–0.93)
Glucose, Bld: 80 mg/dL (ref 65–99)
Potassium: 4 mmol/L (ref 3.5–5.3)
SODIUM: 138 mmol/L (ref 135–146)

## 2015-10-17 LAB — MAGNESIUM: Magnesium: 2 mg/dL (ref 1.5–2.5)

## 2015-10-17 MED ORDER — SOTALOL HCL (AF) 80 MG PO TABS: 80.0000 mg | ORAL_TABLET | Freq: Two times a day (BID) | ORAL | Status: DC

## 2015-10-17 NOTE — Patient Instructions (Signed)
Medication Instructions:  Your physician has recommended you make the following change in your medication:  1) Start Sotalol 80mg  twice daily    Labwork: Your physician recommends that you return for lab work today: BMP/MAG   Testing/Procedures: None ordered   Follow-Up: Your physician recommends that you schedule a follow-up appointment Wed with Roderic Palau, NP for an EKG    Any Other Special Instructions Will Be Listed Below (If Applicable).     If you need a refill on your cardiac medications before your next appointment, please call your pharmacy.

## 2015-10-17 NOTE — Progress Notes (Signed)
PCP: [name], MD Primary Cardiologist: Dr Doug Sou is a 70 y.o. female who presents today for routine electrophysiology followup.  Since her last visit, the patient reports doing very well.   She continues to have frequent afib/ atypical atrial flutter with RVR.  She is unaware of triggers/precipitants.  She reports palpitations.  Today, she denies symptoms of chest pain, shortness of breath,  lower extremity edema, dizziness, presyncope, or syncope.  The patient is otherwise without complaint today.   Past Medical History  Diagnosis Date  . Cancer of breast [facility]) July 2010    s/p XRT 2010 and again in 2011  . Glaucoma   . Cramps, muscle, general     severe breast cramping   . Night sweats   . Pain     breast pain  . Chronic coughing   . Sinus problem   . Wears glasses   . Glaucoma     left eye   . Joint pain     foot and knee pain  . Paroxysmal atrial fibrillation ([place]) 05/2014  . Dysrhythmia     atrial flutter  . Lymphedema of arm     left  . Osteopenia 01/24/2015  . Breast cancer [facility])    Past Surgical History  Procedure Laterality Date  . Tubal ligation  1984  . Colonoscopy N/A 02/05/2013    Procedure: COLONOSCOPY;  Surgeon: [name], MD;  [facility];  Service: Endoscopy;  Laterality: N/A;  830  . Cardioversion N/A 10/21/2014    Procedure: CARDIOVERSION;  Surgeon: [name], MD;  Location: [facility] ENDOSCOPY;  Service: Cardiovascular;  Laterality: N/A;  . Breast surgery  2010- right and 2011-left    for cancer  . Hysteroscopy w/d&c N/A 12/29/2014    Procedure: DILATATION AND CURETTAGE /HYSTEROSCOPY;  Surgeon: [name], MD;  Location: [facility] ORS;  Service: Gynecology;  Laterality: N/A;  . Tee without cardioversion N/A 02/24/2015    Procedure: TRANSESOPHAGEAL ECHOCARDIOGRAM (TEE);  Surgeon: [name], MD;  Location: [facility] ENDOSCOPY;  Service: Cardiovascular;  Laterality: N/A;  . Electrophysiologic study N/A 02/24/2015    PVI and CTI ablation  by Dr [name]  . Ep implantable device N/A 08/24/2015    Procedure: Loop Recorder Insertion;  Surgeon: Thompson Grayer, MD;  Location: [place] CV LAB;  Service: Cardiovascular;  Laterality: N/A;    ROS- all systems are reviewed and negatives except as per HPI above  Current Outpatient Prescriptions  Medication Sig Dispense Refill  . albuterol (PROVENTIL HFA;VENTOLIN HFA) 108 (90 BASE) MCG/ACT inhaler Inhale into the lungs every 6 (six) hours as needed for wheezing or shortness of breath. Reported on 09/05/2015    . apixaban (ELIQUIS) 5 MG TABS tablet Take 1 tablet (5 mg total) by mouth 2 (two) times daily. 180 tablet 3  . brimonidine (ALPHAGAN) 0.2 % ophthalmic solution Place 1 drop into the left eye 2 (two) times daily.     . Calcium Carbonate-Vit D-Min (CALTRATE 600+D PLUS PO) Take 1 tablet by mouth daily.    . Cholecalciferol (VITAMIN D PO) Take 2,000 Units by mouth daily.     . Coenzyme Q10 (COQ-10) 200 MG CAPS Take 1 capsule by mouth daily.    . Cranberry 500 MG CAPS Take 500 mg by mouth 2 (two) times daily.     Marland Kitchen diltiazem (CARDIZEM CD) 240 MG 24 hr capsule Take 1 capsule (240 mg total) by mouth daily. 90 capsule 3  . diltiazem (CARDIZEM) 30  MG tablet Cardizem 30mg  -- take 1 tablet every 4 hours AS NEEDED for heart rate >100 as long as blood pressure >100. 45 tablet 1  . furosemide (LASIX) 20 MG tablet Take 1 tablet (20 mg total) by mouth daily as needed. Take daily as needed for swelling 90 tablet 3  . letrozole (FEMARA) 2.5 MG tablet Take 1 tablet (2.5 mg total) by mouth daily. 30 tablet 6  . loratadine (CLARITIN) 10 MG tablet Take 10 mg by mouth daily as needed. Reported on 09/20/2015    . LUMIGAN 0.01 % SOLN Place 1 drop into both eyes at bedtime.    . Multiple Vitamin (MULTIVITAMIN) tablet Take 1 tablet by mouth daily.     . Omega-3 Fatty Acids (FISH OIL) 1200 MG CAPS Take 1 capsule by mouth daily.     No current facility-administered medications for this visit.    Physical  Exam: Filed Vitals:   10/17/15 1449  BP: 132/84  Pulse: 78  Height: 5\' 3"  (1.6 m)  Weight: 196 lb 12.8 oz (89.268 kg)    GEN- The patient is well appearing, alert and oriented x 3 today.   Head- normocephalic, atraumatic Eyes-  Sclera clear, conjunctiva pink Ears- hearing intact Oropharynx- clear Lungs- Clear to ausculation bilaterally, normal work of breathing Heart- Regular rate and rhythm, no murmurs, rubs or gallops, PMI not laterally displaced GI- soft, NT, ND, + BS Extremities- no clubbing, cyanosis, or edema  ekg today reveals sinus rhythm, Qtc 433  Assessment and Plan:  1. AFib Ongoing afib/ atypical atrial flutter post ablation (10% burden by ILR--> reviewed today) Therapeutic strategies for afib including medicine and repeat ablation were discussed in detail with the patient today.  At this time, she would like to avoid repeat ablation.  Risks and benefits of sotalol, tikosyn, and amiodarone were discussed with the patient today.  She would like to try sotalol.  I will therefore start sotalol 80mg  BID.  Bmet, mg today.  She will follow-up on this Wed with Butch Penny in the AF clinic for repeat EKG to evaluate Qtc. If she continues to have atrial arrhythmias then I would advise either increasing sotalol or ablation in the future.  She will follow closely with Butch Penny in the AF clinic and I will see when needed.  Today, I have spent  25 minutes with the patient discussing atrial fibrillation management.  More than 50% of the visit time today was spent on this issue.   Thompson Grayer MD, [facility] 10/17/2015 3:09 PM

## 2015-10-19 ENCOUNTER — Inpatient Hospital Stay (HOSPITAL_COMMUNITY): Admission: RE | Admit: 2015-10-19 | Payer: Medicare Other | Source: Ambulatory Visit | Admitting: Nurse Practitioner

## 2015-10-20 ENCOUNTER — Ambulatory Visit (HOSPITAL_COMMUNITY)
Admission: RE | Admit: 2015-10-20 | Discharge: 2015-10-20 | Disposition: A | Discharge status: Home / Self Care | Payer: Medicare Other | Source: Ambulatory Visit | Attending: Nurse Practitioner | Admitting: Nurse Practitioner

## 2015-10-20 DIAGNOSIS — I4891 Unspecified atrial fibrillation: Secondary | ICD-10-CM | POA: Insufficient documentation

## 2015-10-20 DIAGNOSIS — I480 Paroxysmal atrial fibrillation: Secondary | ICD-10-CM | POA: Diagnosis not present

## 2015-10-20 NOTE — Progress Notes (Addendum)
Pt in for EKG following initiation of sotalol -- Roderic Palau NP to review EKG  Pt has noticed less afib since being on sotalol 80 mg bid. NSR, intervals ok, pr int 178 ms, qrs int 86 ms, qtc 436 ms. F/u in 3 weeks.

## 2015-10-25 ENCOUNTER — Ambulatory Visit (INDEPENDENT_AMBULATORY_CARE_PROVIDER_SITE_OTHER): Payer: Medicare Other | Admitting: *Deleted

## 2015-10-25 DIAGNOSIS — I480 Paroxysmal atrial fibrillation: Secondary | ICD-10-CM | POA: Diagnosis not present

## 2015-10-25 NOTE — Progress Notes (Signed)
Carelink Summary Report / Loop Recorder 

## 2015-11-08 ENCOUNTER — Ambulatory Visit (HOSPITAL_COMMUNITY)
Admission: RE | Admit: 2015-11-08 | Discharge: 2015-11-08 | Disposition: A | Discharge status: Home / Self Care | Payer: Medicare Other | Source: Ambulatory Visit | Attending: Nurse Practitioner | Admitting: Nurse Practitioner

## 2015-11-08 VITALS — BP 128/64 | HR 55 | Ht 63.0 in | Wt 195.4 lb

## 2015-11-08 DIAGNOSIS — Z79899 Other long term (current) drug therapy: Secondary | ICD-10-CM | POA: Diagnosis not present

## 2015-11-08 DIAGNOSIS — H409 Unspecified glaucoma: Secondary | ICD-10-CM | POA: Insufficient documentation

## 2015-11-08 DIAGNOSIS — Z833 Family history of diabetes mellitus: Secondary | ICD-10-CM | POA: Diagnosis not present

## 2015-11-08 DIAGNOSIS — I4891 Unspecified atrial fibrillation: Secondary | ICD-10-CM | POA: Insufficient documentation

## 2015-11-08 DIAGNOSIS — Z8249 Family history of ischemic heart disease and other diseases of the circulatory system: Secondary | ICD-10-CM | POA: Insufficient documentation

## 2015-11-08 DIAGNOSIS — Z823 Family history of stroke: Secondary | ICD-10-CM | POA: Diagnosis not present

## 2015-11-08 DIAGNOSIS — Z888 Allergy status to other drugs, medicaments and biological substances status: Secondary | ICD-10-CM | POA: Insufficient documentation

## 2015-11-08 DIAGNOSIS — Z7902 Long term (current) use of antithrombotics/antiplatelets: Secondary | ICD-10-CM | POA: Insufficient documentation

## 2015-11-08 DIAGNOSIS — I484 Atypical atrial flutter: Secondary | ICD-10-CM

## 2015-11-08 DIAGNOSIS — I4892 Unspecified atrial flutter: Secondary | ICD-10-CM | POA: Insufficient documentation

## 2015-11-08 DIAGNOSIS — Z923 Personal history of irradiation: Secondary | ICD-10-CM | POA: Diagnosis not present

## 2015-11-08 DIAGNOSIS — Z853 Personal history of malignant neoplasm of breast: Secondary | ICD-10-CM | POA: Diagnosis not present

## 2015-11-08 DIAGNOSIS — Z87891 Personal history of nicotine dependence: Secondary | ICD-10-CM | POA: Insufficient documentation

## 2015-11-09 ENCOUNTER — Encounter (HOSPITAL_COMMUNITY): Payer: Self-pay | Admitting: Nurse Practitioner

## 2015-11-09 NOTE — Progress Notes (Signed)
Patient ID: ZOEII REINIG, female   DOB: [birth year], 70 y.o.   MRN: [id number]     Primary Care Physician: [name], MD Referring Physician:Dr. [name]   Heidi Lopez is a 70 y.o. female with a h/oafib/flutter s/p ablation. She was having breakthrough a. flutter and was started on sotalol 80 mg bid. She is having less breakthrough on sotalol but still having some episodes but none are sustatined. Overall, she feels well. Tolerating sotalol well. No bleeding issues on apixaban.  Today, she denies symptoms of palpitations, chest pain, shortness of breath, orthopnea, PND, lower extremity edema, dizziness, presyncope, syncope, or neurologic sequela. The patient is tolerating medications without difficulties and is otherwise without complaint today.   Past Medical History  Diagnosis Date  . Cancer of breast [facility]) July 2010    s/p XRT 2010 and again in 2011  . Glaucoma   . Cramps, muscle, general     severe breast cramping   . Night sweats   . Pain     breast pain  . Chronic coughing   . Sinus problem   . Wears glasses   . Glaucoma     left eye   . Joint pain     foot and knee pain  . Paroxysmal atrial fibrillation ([place]) 05/2014  . Dysrhythmia     atrial flutter  . Lymphedema of arm     left  . Osteopenia 01/24/2015  . Breast cancer [facility])    Past Surgical History  Procedure Laterality Date  . Tubal ligation  1984  . Colonoscopy N/A 02/05/2013    Procedure: COLONOSCOPY;  Surgeon: [name], MD;  [facility];  Service: Endoscopy;  Laterality: N/A;  830  . Cardioversion N/A 10/21/2014    Procedure: CARDIOVERSION;  Surgeon: [name], MD;  Location: [facility] ENDOSCOPY;  Service: Cardiovascular;  Laterality: N/A;  . Breast surgery  2010- right and 2011-left    for cancer  . Hysteroscopy w/d&c N/A 12/29/2014    Procedure: DILATATION AND CURETTAGE /HYSTEROSCOPY;  Surgeon: [name], MD;  Location: [facility] ORS;  Service: Gynecology;  Laterality: N/A;  . Tee without  cardioversion N/A 02/24/2015    Procedure: TRANSESOPHAGEAL ECHOCARDIOGRAM (TEE);  Surgeon: [name], MD;  Location: [facility] ENDOSCOPY;  Service: Cardiovascular;  Laterality: N/A;  . Electrophysiologic study N/A 02/24/2015    PVI and CTI ablation by Dr [name]  . Ep implantable device N/A 08/24/2015    Procedure: Loop Recorder Insertion;  Surgeon: Thompson Grayer, MD;  Location: [place] CV LAB;  Service: Cardiovascular;  Laterality: N/A;    Current Outpatient Prescriptions  Medication Sig Dispense Refill  . apixaban (ELIQUIS) 5 MG TABS tablet Take 1 tablet (5 mg total) by mouth 2 (two) times daily. 180 tablet 3  . brimonidine (ALPHAGAN) 0.2 % ophthalmic solution Place 1 drop into the left eye 2 (two) times daily.     . Calcium Carbonate-Vit D-Min (CALTRATE 600+D PLUS PO) Take 1 tablet by mouth daily.    . Cholecalciferol (VITAMIN D PO) Take 2,000 Units by mouth daily.     . Coenzyme Q10 (COQ-10) 200 MG CAPS Take 1 capsule by mouth daily.    . Cranberry 500 MG CAPS Take 500 mg by mouth 2 (two) times daily.     Marland Kitchen diltiazem (CARDIZEM CD) 240 MG 24 hr capsule Take 1 capsule (240 mg total) by mouth daily. 90 capsule 3  . diltiazem (CARDIZEM) 30 MG tablet Cardizem 30mg  -- take 1 tablet  every 4 hours AS NEEDED for heart rate >100 as long as blood pressure >100. 45 tablet 1  . letrozole (FEMARA) 2.5 MG tablet Take 1 tablet (2.5 mg total) by mouth daily. 30 tablet 6  . loratadine (CLARITIN) 10 MG tablet Take 10 mg by mouth daily as needed. Reported on 09/20/2015    . LUMIGAN 0.01 % SOLN Place 1 drop into both eyes at bedtime.    . Multiple Vitamin (MULTIVITAMIN) tablet Take 1 tablet by mouth daily.     . Omega-3 Fatty Acids (FISH OIL) 1200 MG CAPS Take 1 capsule by mouth daily.    Marland Kitchen SOTALOL AF 80 MG TABS Take 1 tablet (80 mg total) by mouth 2 (two) times daily. 60 each 11  . albuterol (PROVENTIL HFA;VENTOLIN HFA) 108 (90 BASE) MCG/ACT inhaler Inhale into the lungs every 6 (six) hours as needed for wheezing or  shortness of breath. Reported on 11/08/2015    . furosemide (LASIX) 20 MG tablet Take 1 tablet (20 mg total) by mouth daily as needed. Take daily as needed for swelling (Patient not taking: Reported on 11/08/2015) 90 tablet 3   No current facility-administered medications for this encounter.    Allergies  Allergen Reactions  . Metoprolol     Makes her feel bad  . Reglan [Metoclopramide] Other (See Comments)    Causes severe jitters    Social History   Social History  . Marital Status: Widowed    Spouse Name: N/A  . Number of Children: N/A  . Years of Education: N/A   Occupational History  . Not on file.   Social History Main Topics  . Smoking status: Former Smoker -- 1.00 packs/day for 2 years    Types: Cigarettes    Start date: 09/17/1962    Quit date: 11/16/1966  . Smokeless tobacco: Never Used  . Alcohol Use: No  . Drug Use: No  . Sexual Activity: Yes    Birth Control/ Protection: Post-menopausal   Other Topics Concern  . Not on file   Social History Narrative   Lives alone in [place]   Retired   Worked previously for [employer] and [employer]    Family History  Problem Relation Age of Onset  . Heart disease Mother   . Stroke Father   . Cancer Father   . Early death Brother   . Cancer Sister   . Cancer Sister   . Cancer Sister   . Diabetes Daughter     ROS- All systems are reviewed and negative except as per the HPI above  Physical Exam: Filed Vitals:   11/08/15 1539  BP: 128/64  Pulse: 55  Height: 5\' 3"  (1.6 m)  Weight: 195 lb 6.4 oz (88.633 kg)    GEN- The patient is well appearing, alert and oriented x 3 today.   Head- normocephalic, atraumatic Eyes-  Sclera clear, conjunctiva pink Ears- hearing intact Oropharynx- clear Neck- supple, no JVP Lymph- no cervical lymphadenopathy Lungs- Clear to ausculation bilaterally, normal work of breathing Heart- Regular rate and rhythm, no murmurs, rubs or gallops, PMI not laterally displaced GI-  soft, NT, ND, + BS Extremities- no clubbing, cyanosis, or edema MS- no significant deformity or atrophy Skin- no rash or lesion Psych- euthymic mood, full affect Neuro- strength and sensation are intact  EKG- sinus brady at 55 bpm, pr int 174 ms, qrs int 90 ms, qtc 438 ms(stable) Epic records reviewed  Assessment and Plan: 1. afib S/p ablation and now on sotalol for  breakthrough a flutter Continue sotalol 80 mg bid Continue cardizem Continue apixaban 5 mg bid  F/u in one month  Butch Penny C. Nechama Escutia, [facility] [address] [place], [place] [address] [phone]

## 2015-11-16 LAB — CUP PACEART REMOTE DEVICE CHECK: Date Time Interrogation Session: 20170106214252

## 2015-11-16 NOTE — Progress Notes (Signed)
Carelink summary report received. Battery status OK. Normal device function. No new brady episodes. 7 symptom (4 w/ ECG)- 2 appear ?PAT, 2 appear Afib. 91 tachy episodes- available ECGs appear: 1 w/ ?PAT @ 162 BPM, others available ECGs appear AF w/ RVR. 3 pause episodes-no ECGs available. 141 AF- 1 ECG appears SR w/ PACs & PVCs. AF burden 8.8 &, +Eliquis/+Sotalol. Monthly summary reports and ROV/PRN

## 2015-11-21 ENCOUNTER — Encounter (HOSPITAL_COMMUNITY): Payer: Self-pay

## 2015-11-22 ENCOUNTER — Ambulatory Visit (INDEPENDENT_AMBULATORY_CARE_PROVIDER_SITE_OTHER): Payer: Medicare Other | Admitting: *Deleted

## 2015-11-22 DIAGNOSIS — I480 Paroxysmal atrial fibrillation: Secondary | ICD-10-CM | POA: Diagnosis not present

## 2015-11-25 ENCOUNTER — Encounter: Payer: Medicare Other | Admitting: Internal Medicine

## 2015-11-25 NOTE — Progress Notes (Signed)
Carelink Summary Report / Loop Recorder 

## 2015-11-26 LAB — CUP PACEART REMOTE DEVICE CHECK: Date Time Interrogation Session: 20170307220616

## 2015-11-26 NOTE — Progress Notes (Signed)
Carelink summary report received. Battery status OK. Normal device function. No new symptom episodes, tachy episodes, brady, or pause episodes. 4.5% AF, stable burden, V rates relatively well controlled, +Eliquis. Monthly summary reports and ROV/PRN

## 2015-11-28 ENCOUNTER — Ambulatory Visit (INDEPENDENT_AMBULATORY_CARE_PROVIDER_SITE_OTHER): Payer: Medicare Other | Admitting: Obstetrics & Gynecology

## 2015-11-28 ENCOUNTER — Encounter: Payer: Self-pay | Admitting: Obstetrics & Gynecology

## 2015-11-28 ENCOUNTER — Other Ambulatory Visit (HOSPITAL_COMMUNITY)
Admission: RE | Admit: 2015-11-28 | Discharge: 2015-11-28 | Disposition: A | Discharge status: Home / Self Care | Payer: Medicare Other | Source: Ambulatory Visit | Attending: Obstetrics & Gynecology | Admitting: Obstetrics & Gynecology

## 2015-11-28 VITALS — BP 140/80 | HR 58 | Ht 63.0 in | Wt 199.0 lb

## 2015-11-28 DIAGNOSIS — Z1211 Encounter for screening for malignant neoplasm of colon: Secondary | ICD-10-CM

## 2015-11-28 DIAGNOSIS — Z01411 Encounter for gynecological examination (general) (routine) with abnormal findings: Secondary | ICD-10-CM | POA: Insufficient documentation

## 2015-11-28 DIAGNOSIS — Z1212 Encounter for screening for malignant neoplasm of rectum: Secondary | ICD-10-CM

## 2015-11-28 DIAGNOSIS — Z853 Personal history of malignant neoplasm of breast: Secondary | ICD-10-CM

## 2015-11-28 DIAGNOSIS — Z01419 Encounter for gynecological examination (general) (routine) without abnormal findings: Secondary | ICD-10-CM

## 2015-11-28 NOTE — Progress Notes (Signed)
Patient ID: Heidi Lopez, female   DOB: 01-17-1946, 70 y.o.   MRN: [id number] Subjective:     Heidi Lopez is a 70 y.o. female here for a routine exam.  No LMP recorded. Patient is postmenopausal. No obstetric history on file. Birth Control Method:  Post menopausa Menstrual Calendar(currently): none  Current complaints: occasional A fib episodes.   Current acute medical issues:     Recent Gynecologic History No LMP recorded. Patient is postmenopausal. Last Pap: 2016,  normal Last mammogram: 2016,  normal  Past Medical History  Diagnosis Date  . Cancer of breast [facility]) July 2010    s/p XRT 2010 and again in 2011  . Glaucoma   . Cramps, muscle, general     severe breast cramping   . Night sweats   . Pain     breast pain  . Chronic coughing   . Sinus problem   . Wears glasses   . Glaucoma     left eye   . Joint pain     foot and knee pain  . Paroxysmal atrial fibrillation ([place]) 05/2014  . Dysrhythmia     atrial flutter  . Lymphedema of arm     left  . Osteopenia 01/24/2015  . Breast cancer [facility])     Past Surgical History  Procedure Laterality Date  . Tubal ligation  1984  . Colonoscopy N/A 02/05/2013    Procedure: COLONOSCOPY;  Surgeon: [name], MD;  [facility];  Service: Endoscopy;  Laterality: N/A;  830  . Cardioversion N/A 10/21/2014    Procedure: CARDIOVERSION;  Surgeon: [name], MD;  Location: [facility] ENDOSCOPY;  Service: Cardiovascular;  Laterality: N/A;  . Breast surgery  2010- right and 2011-left    for cancer  . Hysteroscopy w/d&c N/A 12/29/2014    Procedure: DILATATION AND CURETTAGE /HYSTEROSCOPY;  Surgeon: [name], MD;  Location: [facility] ORS;  Service: Gynecology;  Laterality: N/A;  . Tee without cardioversion N/A 02/24/2015    Procedure: TRANSESOPHAGEAL ECHOCARDIOGRAM (TEE);  Surgeon: [name], MD;  Location: [facility] ENDOSCOPY;  Service: Cardiovascular;  Laterality: N/A;  . Electrophysiologic study N/A 02/24/2015    PVI and CTI ablation  by Dr [name]  . Ep implantable device N/A 08/24/2015    Procedure: Loop Recorder Insertion;  Surgeon: Thompson Grayer, MD;  Location: [place] CV LAB;  Service: Cardiovascular;  Laterality: N/A;    OB History    No data available      Social History   Social History  . Marital Status: Widowed    Spouse Name: N/A  . Number of Children: N/A  . Years of Education: N/A   Social History Main Topics  . Smoking status: Former Smoker -- 1.00 packs/day for 2 years    Types: Cigarettes    Start date: 09/17/1962    Quit date: 11/16/1966  . Smokeless tobacco: Never Used  . Alcohol Use: No  . Drug Use: No  . Sexual Activity: Yes    Birth Control/ Protection: Post-menopausal   Other Topics Concern  . None   Social History Narrative   Lives alone in [place]   Retired   Worked previously for [employer] and [employer]    Family History  Problem Relation Age of Onset  . Heart disease Mother   . Stroke Father   . Cancer Father   . Early death Brother   . Cancer Sister   . Cancer Sister   . Cancer Sister   .  Diabetes Daughter      Current outpatient prescriptions:  .  apixaban (ELIQUIS) 5 MG TABS tablet, Take 1 tablet (5 mg total) by mouth 2 (two) times daily., Disp: 180 tablet, Rfl: 3 .  brimonidine (ALPHAGAN) 0.2 % ophthalmic solution, Place 1 drop into the left eye 2 (two) times daily. , Disp: , Rfl:  .  Calcium Carbonate-Vit D-Min (CALTRATE 600+D PLUS PO), Take 1 tablet by mouth daily., Disp: , Rfl:  .  Cholecalciferol (VITAMIN D PO), Take 2,000 Units by mouth daily. , Disp: , Rfl:  .  Coenzyme Q10 (COQ-10) 200 MG CAPS, Take 1 capsule by mouth daily., Disp: , Rfl:  .  Cranberry 500 MG CAPS, Take 500 mg by mouth 2 (two) times daily. , Disp: , Rfl:  .  diltiazem (CARDIZEM CD) 240 MG 24 hr capsule, Take 1 capsule (240 mg total) by mouth daily., Disp: 90 capsule, Rfl: 3 .  diltiazem (CARDIZEM) 30 MG tablet, Cardizem 30mg  -- take 1 tablet every 4 hours AS NEEDED for heart rate  >100 as long as blood pressure >100., Disp: 45 tablet, Rfl: 1 .  letrozole (FEMARA) 2.5 MG tablet, Take 1 tablet (2.5 mg total) by mouth daily., Disp: 30 tablet, Rfl: 6 .  loratadine (CLARITIN) 10 MG tablet, Take 10 mg by mouth daily as needed. Reported on 09/20/2015, Disp: , Rfl:  .  LUMIGAN 0.01 % SOLN, Place 1 drop into both eyes at bedtime., Disp: , Rfl:  .  Multiple Vitamin (MULTIVITAMIN) tablet, Take 1 tablet by mouth daily. , Disp: , Rfl:  .  Omega-3 Fatty Acids (FISH OIL) 1200 MG CAPS, Take 1 capsule by mouth daily., Disp: , Rfl:  .  SOTALOL AF 80 MG TABS, Take 1 tablet (80 mg total) by mouth 2 (two) times daily., Disp: 60 each, Rfl: 11 .  albuterol (PROVENTIL HFA;VENTOLIN HFA) 108 (90 BASE) MCG/ACT inhaler, Inhale into the lungs every 6 (six) hours as needed for wheezing or shortness of breath. Reported on 11/28/2015, Disp: , Rfl:  .  furosemide (LASIX) 20 MG tablet, Take 1 tablet (20 mg total) by mouth daily as needed. Take daily as needed for swelling (Patient not taking: Reported on 11/08/2015), Disp: 90 tablet, Rfl: 3  Review of Systems  Review of Systems  Constitutional: Negative for fever, chills, weight loss, malaise/fatigue and diaphoresis.  HENT: Negative for hearing loss, ear pain, nosebleeds, congestion, sore throat, neck pain, tinnitus and ear discharge.   Eyes: Negative for blurred vision, double vision, photophobia, pain, discharge and redness.  Respiratory: Negative for cough, hemoptysis, sputum production, shortness of breath, wheezing and stridor.   Cardiovascular: Negative for chest pain, palpitations, orthopnea, claudication, leg swelling and PND.  Gastrointestinal: negative for abdominal pain. Negative for heartburn, nausea, vomiting, diarrhea, constipation, blood in stool and melena.  Genitourinary: Negative for dysuria, urgency, frequency, hematuria and flank pain.  Musculoskeletal: Negative for myalgias, back pain, joint pain and falls.  Skin: Negative for itching and  rash.  Neurological: Negative for dizziness, tingling, tremors, sensory change, speech change, focal weakness, seizures, loss of consciousness, weakness and headaches.  Endo/Heme/Allergies: Negative for environmental allergies and polydipsia. Does not bruise/bleed easily.  Psychiatric/Behavioral: Negative for depression, suicidal ideas, hallucinations, memory loss and substance abuse. The patient is not nervous/anxious and does not have insomnia.        Objective:  Blood pressure 140/80, pulse 58, height 5\' 3"  (1.6 m), weight 199 lb (90.266 kg).   Physical Exam  Vitals reviewed. Constitutional: She is oriented  to person, place, and time. She appears well-developed and well-nourished.  HENT:  Head: Normocephalic and atraumatic.        Right Ear: External ear normal.  Left Ear: External ear normal.  Nose: Nose normal.  Mouth/Throat: Oropharynx is clear and moist.  Eyes: Conjunctivae and EOM are normal. Pupils are equal, round, and reactive to light. Right eye exhibits no discharge. Left eye exhibits no discharge. No scleral icterus.  Neck: Normal range of motion. Neck supple. No tracheal deviation present. No thyromegaly present.  Cardiovascular: Normal rate, regular rhythm, normal heart sounds and intact distal pulses.  Exam reveals no gallop and no friction rub.   No murmur heard. Respiratory: Effort normal and breath sounds normal. No respiratory distress. She has no wheezes. She has no rales. She exhibits no tenderness.  GI: Soft. Bowel sounds are normal. She exhibits no distension and no mass. There is no tenderness. There is no rebound and no guarding.  Genitourinary:  Breasts no masses skin changes or nipple changes bilaterally      Vulva is normal without lesions Vagina is pink moist without discharge Cervix normal in appearance and pap is done Uterus is normal size shape and contour Adnexa is negative with normal sized ovaries  {Rectal    hemoccult negative, normal tone, no  masses  Musculoskeletal: Normal range of motion. She exhibits no edema and no tenderness.  Neurological: She is alert and oriented to person, place, and time. She has normal reflexes. She displays normal reflexes. No cranial nerve deficit. She exhibits normal muscle tone. Coordination normal.  Skin: Skin is warm and dry. No rash noted. No erythema. No pallor.  Psychiatric: She has a normal mood and affect. Her behavior is normal. Judgment and thought content normal.       Assessment:    Healthy female exam.    Plan:    Mammogram ordered. Follow up in: 1 year.    No orders of the defined types were placed in this encounter.    No orders of the defined types were placed in this encounter.

## 2015-11-30 LAB — CYTOLOGY - PAP

## 2015-12-06 ENCOUNTER — Ambulatory Visit (HOSPITAL_COMMUNITY)
Admission: RE | Admit: 2015-12-06 | Discharge: 2015-12-06 | Disposition: A | Discharge status: Home / Self Care | Payer: Medicare Other | Source: Ambulatory Visit | Attending: Nurse Practitioner | Admitting: Nurse Practitioner

## 2015-12-06 ENCOUNTER — Encounter (HOSPITAL_COMMUNITY): Payer: Self-pay | Admitting: Nurse Practitioner

## 2015-12-06 VITALS — BP 118/60 | HR 67 | Ht 63.0 in | Wt 198.6 lb

## 2015-12-06 DIAGNOSIS — I480 Paroxysmal atrial fibrillation: Secondary | ICD-10-CM | POA: Diagnosis not present

## 2015-12-06 DIAGNOSIS — I4891 Unspecified atrial fibrillation: Secondary | ICD-10-CM | POA: Diagnosis not present

## 2015-12-06 NOTE — Progress Notes (Signed)
Patient ID: Heidi Lopez, female   DOB: 16-Nov-1945, 70 y.o.   MRN: [id number]    Primary Care Physician: [name], MD Referring Physician: Dr. Melburn Hake Heidi Lopez is a 70 y.o. female with a h/o PAF and s/p ablation. She is on sotalol now for afib/atypical a flutter with RVR. She states  that she still has break through episodes  but now only about  91m-30 mis at at time instead of hours. Offered to increase the dose of sotalol but she is ok with her current burden. She is afraid of brady vrs untoward side effects at higher doses.  She also describes some mild chest pain at times but usaully comes on after meals and is relieved with ginger which she uses to treat indigestion. Her pain does not sound exertional.  Today, she denies symptoms of palpitations, chest pain, shortness of breath, orthopnea, PND, lower extremity edema, dizziness, presyncope, syncope, or neurologic sequela. The patient is tolerating medications without difficulties and is otherwise without complaint today.   Past Medical History  Diagnosis Date  . Cancer of breast [facility]) July 2010    s/p XRT 2010 and again in 2011  . Glaucoma   . Cramps, muscle, general     severe breast cramping   . Night sweats   . Pain     breast pain  . Chronic coughing   . Sinus problem   . Wears glasses   . Glaucoma     left eye   . Joint pain     foot and knee pain  . Paroxysmal atrial fibrillation ([place]) 05/2014  . Dysrhythmia     atrial flutter  . Lymphedema of arm     left  . Osteopenia 01/24/2015  . Breast cancer [facility])    Past Surgical History  Procedure Laterality Date  . Tubal ligation  1984  . Colonoscopy N/A 02/05/2013    Procedure: COLONOSCOPY;  Surgeon: [name], MD;  [facility];  Service: Endoscopy;  Laterality: N/A;  830  . Cardioversion N/A 10/21/2014    Procedure: CARDIOVERSION;  Surgeon: [name], MD;  Location: [facility] ENDOSCOPY;  Service: Cardiovascular;  Laterality: N/A;  . Breast surgery   2010- right and 2011-left    for cancer  . Hysteroscopy w/d&c N/A 12/29/2014    Procedure: DILATATION AND CURETTAGE /HYSTEROSCOPY;  Surgeon: [name], MD;  Location: [facility] ORS;  Service: Gynecology;  Laterality: N/A;  . Tee without cardioversion N/A 02/24/2015    Procedure: TRANSESOPHAGEAL ECHOCARDIOGRAM (TEE);  Surgeon: [name], MD;  Location: [facility] ENDOSCOPY;  Service: Cardiovascular;  Laterality: N/A;  . Electrophysiologic study N/A 02/24/2015    PVI and CTI ablation by Dr [name]  . Ep implantable device N/A 08/24/2015    Procedure: Loop Recorder Insertion;  Surgeon: Thompson Grayer, MD;  Location: [place] CV LAB;  Service: Cardiovascular;  Laterality: N/A;    Current Outpatient Prescriptions  Medication Sig Dispense Refill  . albuterol (PROVENTIL HFA;VENTOLIN HFA) 108 (90 BASE) MCG/ACT inhaler Inhale into the lungs every 6 (six) hours as needed for wheezing or shortness of breath. Reported on 11/28/2015    . apixaban (ELIQUIS) 5 MG TABS tablet Take 1 tablet (5 mg total) by mouth 2 (two) times daily. 180 tablet 3  . brimonidine (ALPHAGAN) 0.2 % ophthalmic solution Place 1 drop into the left eye 2 (two) times daily.     . Calcium Carbonate-Vit D-Min (CALTRATE 600+D PLUS PO) Take 1 tablet by mouth  daily.    . Cholecalciferol (VITAMIN D PO) Take 2,000 Units by mouth daily.     . Coenzyme Q10 (COQ-10) 200 MG CAPS Take 1 capsule by mouth daily.    . Cranberry 500 MG CAPS Take 500 mg by mouth 2 (two) times daily.     Marland Kitchen diltiazem (CARDIZEM CD) 240 MG 24 hr capsule Take 1 capsule (240 mg total) by mouth daily. 90 capsule 3  . diltiazem (CARDIZEM) 30 MG tablet Cardizem 30mg  -- take 1 tablet every 4 hours AS NEEDED for heart rate >100 as long as blood pressure >100. 45 tablet 1  . furosemide (LASIX) 20 MG tablet Take 1 tablet (20 mg total) by mouth daily as needed. Take daily as needed for swelling 90 tablet 3  . letrozole (FEMARA) 2.5 MG tablet Take 1 tablet (2.5 mg total) by mouth daily. 30 tablet  6  . loratadine (CLARITIN) 10 MG tablet Take 10 mg by mouth daily as needed. Reported on 09/20/2015    . LUMIGAN 0.01 % SOLN Place 1 drop into both eyes at bedtime.    . Multiple Vitamin (MULTIVITAMIN) tablet Take 1 tablet by mouth daily.     . Omega-3 Fatty Acids (FISH OIL) 1200 MG CAPS Take 1 capsule by mouth daily.    Marland Kitchen SOTALOL AF 80 MG TABS Take 1 tablet (80 mg total) by mouth 2 (two) times daily. 60 each 11   No current facility-administered medications for this encounter.    Allergies  Allergen Reactions  . Metoprolol     Makes her feel bad  . Reglan [Metoclopramide] Other (See Comments)    Causes severe jitters    Social History   Social History  . Marital Status: Widowed    Spouse Name: N/A  . Number of Children: N/A  . Years of Education: N/A   Occupational History  . Not on file.   Social History Main Topics  . Smoking status: Former Smoker -- 1.00 packs/day for 2 years    Types: Cigarettes    Start date: 09/17/1962    Quit date: 11/16/1966  . Smokeless tobacco: Never Used  . Alcohol Use: No  . Drug Use: No  . Sexual Activity: Yes    Birth Control/ Protection: Post-menopausal   Other Topics Concern  . Not on file   Social History Narrative   Lives alone in [place]   Retired   Worked previously for [employer] and [employer]    Family History  Problem Relation Age of Onset  . Heart disease Mother   . Stroke Father   . Cancer Father   . Early death Brother   . Cancer Sister   . Cancer Sister   . Cancer Sister   . Diabetes Daughter     ROS- All systems are reviewed and negative except as per the HPI above  Physical Exam: Filed Vitals:   12/06/15 1531  BP: 118/60  Pulse: 67  Height: 5\' 3"  (1.6 m)  Weight: 198 lb 9.6 oz (90.084 kg)    GEN- The patient is well appearing, alert and oriented x 3 today.   Head- normocephalic, atraumatic Eyes-  Sclera clear, conjunctiva pink Ears- hearing intact Oropharynx- clear Neck- supple, no JVP Lymph-  no cervical lymphadenopathy Lungs- Clear to ausculation bilaterally, normal work of breathing Heart- Regular rate and rhythm, no murmurs, rubs or gallops, PMI not laterally displaced GI- soft, NT, ND, + BS Extremities- no clubbing, cyanosis, or edema MS- no significant deformity or atrophy Skin-  no rash or lesion Psych- euthymic mood, full affect Neuro- strength and sensation are intact  EKG- NSR at 67 bpm, v rate 67 bpm, pr int 178 ms, qrs int 90 ms, qtc 431 ms. Epic records reviewed  Assessment and Plan: 1. Afib Enjoying less afib/flutter ablation on sotalol No change for now Continue apixaban  2. Chest pain probable GI related Usually follows certain foods and is relieved with ginger Does not sound exertional  Butch Penny C. [place], [facility] [address] [place], [place] [address] [phone]

## 2015-12-22 ENCOUNTER — Ambulatory Visit (INDEPENDENT_AMBULATORY_CARE_PROVIDER_SITE_OTHER): Payer: Medicare Other | Admitting: *Deleted

## 2015-12-22 DIAGNOSIS — I480 Paroxysmal atrial fibrillation: Secondary | ICD-10-CM | POA: Diagnosis not present

## 2015-12-23 NOTE — Progress Notes (Signed)
Carelink Summary Report / Loop Recorder 

## 2016-01-07 LAB — CUP PACEART REMOTE DEVICE CHECK: Date Time Interrogation Session: 20170205214043

## 2016-01-07 NOTE — Progress Notes (Signed)
Carelink summary report received. Battery status OK. Normal device function. No new brady or pause episodes. 8 symptom episodes- 3 with available ECGs that appear to be AF RVR. 300 tachy- appear to be AF RVR. 164 AF 10.3% +Eliquis +Sotalol + Diltiazem. Elevated histograms noted. Monthly summary reports and ROV/PRN

## 2016-01-19 NOTE — Assessment & Plan Note (Addendum)
Osteopenia on bone density exam on 06/30/2012 while on an aromatase inhibitor.  She has completed her dental work and therefore we reviewed her bone density exam from 2016 demonstrating osteopenia.  We discussed treatment options including continuation of Ca++ and Vit D +/- the addition of Prolia.  I reviewed the risks, benefits, alternatives, and side effects of Prolia intervention including ONJ and hypocalcemia.  She is currently taking 600 mg of Ca++ and I have asked her to increase this to 1000- 1200 mg daily.  She is taking an adequate amount of Vit D.  She is not sure that she wants to pursue Prolia intervention.  She is provided information to review regarding Prolia.  Additionally, she is advised to call us with her decision if she decides to pursue this treatment so we can start working on Biochemist, clinical, etc.

## 2016-01-19 NOTE — Progress Notes (Signed)
**Note Heidi via Obfuscation** Heidi Lopez, [place] [address]  History of breast cancer, bilateral: Right T1N0 Lumpectomy July 2010, Left T1c, N0 Lumpectomy 04/26/2010. - Plan: CBC with Differential, Comprehensive metabolic panel  Osteopenia - Plan: CBC with Differential, Comprehensive metabolic panel  CURRENT THERAPY: Femara  INTERVAL HISTORY: Heidi Lopez 70 y.o. 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 Estrogen receptor +95% progesterone receptor +96% Ki-67 marker low at 9% HER-2/neu not amplified status post lumpectomy on 04/26/2010, followed by radiation therapy finishing on 07/21/2010.  She started tamoxifen on 08/16/2010. 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 Estrogen receptors were 96% positive, progesterone 33% positive Ki-67 marker low 8% HER-2/neu negative and she did not receive any hormonal therapy after the radiation on the right side. AND BCI testing on 12/26/2013- high risk of late recurrence of 5.6% between years 5-10, high likelihood of benefit from extended endocrine therapy, 67% relative risk reduction with extended endocrine therapy, intermediate risk of overall recurrent of 10.1% between years 0-10. AND Osteopenia on bone density exam on 06/30/2012 AND Intolerance to Tamoxifen, Arimidex, Aromasin  She is now on Femara.  She notes ankle and hand joint aches that are stable compared to her previous office visit. She denies any progression of this discomfort. She notes that it improves as she uses the joints through the day. We discussed options including discontinuing antiestrogen therapy.  I have reviewed her results from her BCI testing, which she already knows from previous discussions. Her BCI testing demonstrates high  risk of late recurrence with significant risk reduction with extended endocrine therapy. Essentially, it is this data at/information that makes her want to continue taking antiestrogen therapy. At this time, she will continue taking Femara.    Her next mammogram is due in August 2017.   No role for laboratory work today.   She notes that she is done with her dental work and therefore we reviewed her bone density exam from 2016 showed osteopenia. We discussed interventions for this given that she is on antiestrogen therapy, Femara, which is known to increase the risk of osteoporosis. She is currently see him in vitamin D. I broached the subject of adding Prolia to her regimen which is an approved dictation for the medication (osteopenia in the setting of anti-estrogen therapy/aromatase inhibitor).  I provided her education regarding the risks, benefits, alternatives, and side effects of Prolia including, but not limited to, osteonecrosis of the jaw and hypocalcemia. She would like to consider this option and therefore I've given her information from up to date regarding Prolia. She will call us if she is ready to start this medication prior to her follow-up appointment.   Past Medical History  Diagnosis Date  . Cancer of breast [facility]) July 2010    s/p XRT 2010 and again in 2011  . Glaucoma   . Cramps, muscle, general     severe breast cramping   . Night sweats   . Pain     breast pain  . Chronic coughing   . Sinus problem   . Wears glasses   . Glaucoma     left eye   . Joint pain     foot and knee pain  . Paroxysmal atrial fibrillation ([place]) 05/2014  .  Dysrhythmia     atrial flutter  . Lymphedema of arm     left  . Osteopenia 01/24/2015  . Breast cancer [facility])     has History of breast cancer, bilateral: Right T1N0 Lumpectomy July 2010, Left T1c, N0 Lumpectomy 04/26/2010.; Pain in joint, ankle and foot; A-fib ([place]); Snoring; Atypical glandular cells of undetermined significance (AGUS) on  cervical Pap smear; Thickened endometrium; Osteopenia; and Atrial flutter ([place]) on her problem list.     is allergic to metoprolol and reglan.  Current Outpatient Prescriptions on File Prior to Visit  Medication Sig Dispense Refill  . albuterol (PROVENTIL HFA;VENTOLIN HFA) 108 (90 BASE) MCG/ACT inhaler Inhale into the lungs every 6 (six) hours as needed for wheezing or shortness of breath. Reported on 11/28/2015    . apixaban (ELIQUIS) 5 MG TABS tablet Take 1 tablet (5 mg total) by mouth 2 (two) times daily. 180 tablet 3  . brimonidine (ALPHAGAN) 0.2 % ophthalmic solution Place 1 drop into the left eye 2 (two) times daily.     . Calcium Carbonate-Vit D-Min (CALTRATE 600+D PLUS PO) Take 1 tablet by mouth daily.    . Cholecalciferol (VITAMIN D PO) Take 2,000 Units by mouth daily.     . Coenzyme Q10 (COQ-10) 200 MG CAPS Take 1 capsule by mouth daily.    . Cranberry 500 MG CAPS Take 500 mg by mouth 2 (two) times daily.     Marland Kitchen diltiazem (CARDIZEM CD) 240 MG 24 hr capsule Take 1 capsule (240 mg total) by mouth daily. 90 capsule 3  . diltiazem (CARDIZEM) 30 MG tablet Cardizem '30mg'$  -- take 1 tablet every 4 hours AS NEEDED for heart rate >100 as long as blood pressure >100. 45 tablet 1  . furosemide (LASIX) 20 MG tablet Take 1 tablet (20 mg total) by mouth daily as needed. Take daily as needed for swelling 90 tablet 3  . letrozole (FEMARA) 2.5 MG tablet Take 1 tablet (2.5 mg total) by mouth daily. 30 tablet 6  . loratadine (CLARITIN) 10 MG tablet Take 10 mg by mouth daily as needed. Reported on 09/20/2015    . LUMIGAN 0.01 % SOLN Place 1 drop into both eyes at bedtime.    . Multiple Vitamin (MULTIVITAMIN) tablet Take 1 tablet by mouth daily.     . Omega-3 Fatty Acids (FISH OIL) 1200 MG CAPS Take 1 capsule by mouth daily.    Marland Kitchen SOTALOL AF 80 MG TABS Take 1 tablet (80 mg total) by mouth 2 (two) times daily. 60 each 11   No current facility-administered medications on file prior to visit.    Past Surgical  History  Procedure Laterality Date  . Tubal ligation  1984  . Colonoscopy N/A 02/05/2013    Procedure: COLONOSCOPY;  Surgeon: [name], MD;  Location: [facility] ENDO [facility];  Service: Endoscopy;  Laterality: N/A;  830  . Cardioversion N/A 10/21/2014    Procedure: CARDIOVERSION;  Surgeon: [name], MD;  Location: [facility] ENDOSCOPY;  Service: Cardiovascular;  Laterality: N/A;  . Breast surgery  2010- right and 2011-left    for cancer  . Hysteroscopy w/d&c N/A 12/29/2014    Procedure: DILATATION AND CURETTAGE /HYSTEROSCOPY;  Surgeon: [name], MD;  Location: [facility] ORS;  Service: Gynecology;  Laterality: N/A;  . Tee without cardioversion N/A 02/24/2015    Procedure: TRANSESOPHAGEAL ECHOCARDIOGRAM (TEE);  Surgeon: [name], MD;  Location[facility] ENDOSCOPY;  Service: Cardiovascular;  Laterality: N/A;  . Electrophysiologic study N/A 02/24/2015  PVI and CTI ablation by Dr Johney Frame  . Ep implantable device N/A 08/24/2015    Procedure: Loop Recorder Insertion;  Surgeon: Hillis Range, MD;  Location: [facility] INVASIVE CV LAB;  Service: Cardiovascular;  Laterality: N/A;    Denies any headaches, dizziness, double vision, fevers, chills, night sweats, nausea, vomiting, diarrhea, constipation, chest pain, heart palpitations, shortness of breath, blood in stool, black tarry stool, urinary pain, urinary burning, urinary frequency, hematuria.   PHYSICAL EXAMINATION  ECOG PERFORMANCE STATUS: 1 - Symptomatic but completely ambulatory  Filed Vitals:   01/20/16 1029  BP: 126/53  Pulse: 65  Temp: 97.4 F (36.3 C)  Resp: 18    GENERAL:alert, no distress, well nourished, well developed, comfortable, cooperative, obese, smiling and unaccompanied. SKIN: skin color, texture, turgor are normal, no rashes or significant lesions HEAD: Normocephalic, No masses, lesions, tenderness or abnormalities EYES: normal, EOMI, Conjunctiva are pink and non-injected EARS: External ears normal OROPHARYNX:lips, buccal mucosa, and  tongue normal and mucous membranes are moist  NECK: supple, thyroid normal size, non-tender, without nodularity, trachea midline LYMPH:  no palpable lymphadenopathy, no hepatosplenomegaly BREAST:post-lumpectomy bilaterally with sites well healed and free of suspicious changes LUNGS: clear to auscultation and percussion without wheezes, rales, or rhonchi.  HEART: regular rate & rhythm, no murmurs, no gallops, S1 normal and S2 normal ABDOMEN:abdomen soft, non-tender, obese, normal bowel sounds and no masses or organomegaly BACK: Back symmetric, no curvature., No CVA tenderness EXTREMITIES:less then 2 second capillary refill, no joint deformities, effusion, or inflammation, no skin discoloration, no clubbing, no cyanosis  NEURO: alert & oriented x 3 with fluent speech, no focal motor/sensory deficits, gait normal   LABORATORY DATA: CBC    Component Value Date/Time   WBC 7.8 05/30/2015 0842   RBC 4.47 05/30/2015 0842   HGB 14.0 05/30/2015 0842   HCT 40.5 05/30/2015 0842   PLT 203 05/30/2015 0842   MCV 90.6 05/30/2015 0842   MCH 31.3 05/30/2015 0842   MCHC 34.6 05/30/2015 0842   RDW 12.6 05/30/2015 0842   LYMPHSABS 2.6 05/30/2015 0842   MONOABS 0.9 05/30/2015 0842   EOSABS 0.2 05/30/2015 0842   BASOSABS 0.0 05/30/2015 0842      Chemistry      Component Value Date/Time   NA 138 10/17/2015 1533   K 4.0 10/17/2015 1533   CL 104 10/17/2015 1533   CO2 25 10/17/2015 1533   BUN 13 10/17/2015 1533   CREATININE 0.69 10/17/2015 1533   CREATININE 0.66 05/30/2015 0842      Component Value Date/Time   CALCIUM 9.7 10/17/2015 1533   ALKPHOS 69 05/30/2015 0842   AST 36 05/30/2015 0842   ALT 45 05/30/2015 0842   BILITOT 0.5 05/30/2015 0842        PENDING LABS:   RADIOGRAPHIC STUDIES:  No results found.   PATHOLOGY:    ASSESSMENT AND PLAN:  History of breast cancer, bilateral: Right T1N0 Lumpectomy July 2010, Left T1c, N0 Lumpectomy 04/26/2010. invasive ductal carcinoma the  left breast 1.7 cm in size with LV I but negative sentinel nodes. Estrogen receptor +95% progesterone receptor +96% Ki-67 marker low at 9% HER-2/neu not amplified status post lumpectomy on 04/26/2010, followed by radiation therapy finishing on 07/21/2010.  She started tamoxifen on 08/16/2010. 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 Estrogen receptors were 96% positive, progesterone 33% positive Ki-67 marker low 8% HER-2/neu  negative and she did not receive any hormonal therapy after the radiation on the right side. AND BCI testing on 12/26/2013- high risk of late recurrence of 5.6% between years 5-10, high likelihood of benefit from extended endocrine therapy, 67% relative risk reduction with extended endocrine therapy, intermediate risk of overall recurrent of 10.1% between years 0-10. AND Intolerance to Tamoxifen, Arimidex  She is now on Femara.  She notes ankle and hand soreness.  She notes that it is stable without progression.  I reviewed her BCI data with her.  We discussed options including discontinuing anti-estogen therapy.  For now, she wishes to continue.  She knows to call us with any issues regarding this.  She notes that all of her dental work is completed.  I discussed her osteopenia with her and we reviewed options regarding treatment.  Next mammogram is due in August 2017.  Labs in 4 months: CBC diff, CMET  Return in 4 months for follow-up.  A total time of 35 minutes was spent in face-to-face discussion (10:35 AM- 11:00 AM and 11:05- 11:15 AM) and 45 minutes total spent on entire encounter..  Osteopenia Osteopenia on bone density exam on 06/30/2012 while on an aromatase inhibitor.  She has completed her dental work and therefore we reviewed her bone density exam from 2016 demonstrating osteopenia.  We discussed treatment  options including continuation of Ca++ and Vit D +/- the addition of Prolia.  I reviewed the risks, benefits, alternatives, and side effects of Prolia intervention including ONJ and hypocalcemia.  She is currently taking 600 mg of Ca++ and I have asked her to increase this to 1000- 1200 mg daily.  She is taking an adequate amount of Vit D.  She is not sure that she wants to pursue Prolia intervention.  She is provided information to review regarding Prolia.  Additionally, she is advised to call us with her decision if she decides to pursue this treatment so we can start working on insurance approval, etc.   THERAPY PLAN: NCCN guidelines recommends the following surveillance for invasive breast cancer (2.2016):  A. History and Physical exam 1-4 times per year as clinically appropriate for 5 years, then annually.  B. Periodic screening for changes in family history and referral to genetics counseling as indicated  C. Educate, monitor, and refer to lymphedema management.  D. Mammography every 12 months  E. Routine imaging of reconstructed breast is not indicated.  F. In the absence of clinical signs and symptoms suggestive of recurrent disease, there is no indication for laboratory or imaging studies for metastases screening.  G. Women on Tamoxifen: annual gynecologic assessment every 12 months if uterus is present.  H. Women on aromatase inhibitor or who experience ovarian failure secondary to treatment should have monitoring of bone health with a bone mineral density determination at baseline and periodically thereafter.  I. Assess and encourage adherence to adjuvant endocrine therapy.  J. Evidence suggests that active lifestyle, healthy diet, limited alcohol intake, and achieving and maintaining an ideal body weight (20-25 BMI) may lead to optimal breast cancer outcomes.   All questions were answered. The patient knows to call the clinic with any problems, questions or concerns. We can certainly  see the patient much sooner if necessary.  Patient and plan discussed with Dr. Ancil Linsey and she is in agreement with the aforementioned.   This note is electronically signed by: Robynn Pane, PA-C 01/20/2016 11:59 AM

## 2016-01-19 NOTE — Assessment & Plan Note (Addendum)
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 Estrogen receptor +95% progesterone receptor +96% Ki-67 marker low at 9% HER-2/neu not amplified status post lumpectomy on 04/26/2010, followed by radiation therapy finishing on 07/21/2010.  She started tamoxifen on 08/16/2010. 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 Estrogen receptors were 96% positive, progesterone 33% positive Ki-67 marker low 8% HER-2/neu negative and she did not receive any hormonal therapy after the radiation on the right side. AND BCI testing on 12/26/2013- high risk of late recurrence of 5.6% between years 5-10, high likelihood of benefit from extended endocrine therapy, 67% relative risk reduction with extended endocrine therapy, intermediate risk of overall recurrent of 10.1% between years 0-10. AND Intolerance to Tamoxifen, Arimidex  She is now on Femara.  She notes ankle and hand soreness.  She notes that it is stable without progression.  I reviewed her BCI data with her.  We discussed options including discontinuing anti-estogen therapy.  For now, she wishes to continue.  She knows to call us with any issues regarding this.  She notes that all of her dental work is completed.  I discussed her osteopenia with her and we reviewed options regarding treatment.  Next mammogram is due in August 2017.  Labs in 4 months: CBC diff, CMET  Return in 4 months for follow-up.  A total time of 35 minutes was spent in face-to-face discussion (10:35 AM- 11:00 AM and 11:05- 11:15 AM) and 45 minutes total spent on entire encounter.Marland Kitchen

## 2016-01-20 ENCOUNTER — Encounter (HOSPITAL_COMMUNITY): Payer: Medicare Other | Attending: Oncology | Admitting: Oncology

## 2016-01-20 ENCOUNTER — Encounter (HOSPITAL_COMMUNITY): Payer: Self-pay | Admitting: Oncology

## 2016-01-20 ENCOUNTER — Ambulatory Visit (HOSPITAL_COMMUNITY): Payer: Medicare Other | Admitting: Oncology

## 2016-01-20 VITALS — BP 126/53 | HR 65 | Temp 97.4°F | Resp 18 | Wt 201.0 lb

## 2016-01-20 DIAGNOSIS — Z853 Personal history of malignant neoplasm of breast: Secondary | ICD-10-CM | POA: Diagnosis not present

## 2016-01-20 DIAGNOSIS — M8580 Other specified disorders of bone density and structure, unspecified site: Secondary | ICD-10-CM | POA: Diagnosis not present

## 2016-01-20 NOTE — Patient Instructions (Addendum)
[place] at [facility] Discharge Instructions  RECOMMENDATIONS MADE BY THE CONSULTANT AND ANY TEST RESULTS WILL BE SENT TO YOUR REFERRING PHYSICIAN.  Exam done and seen today by [facility] in 4 months Mammogram in August Take 1200 of calcium a day if you can. Info given on Prolia, she will think on that. Return to see the Doctor in 4 months Call the clinic for any concerns or questions.   Thank you for choosing [place] at [facility] to provide your oncology and hematology care.  To afford each patient quality time with our provider, please arrive at least 15 minutes before your scheduled appointment time.   Beginning January 23rd 2017 lab work for the [facility] will be done in the  Main lab at [facility] on 1st floor. If you have a lab appointment with the [employer] please come in thru the  Main Entrance and check in at the main information desk  You need to re-schedule your appointment should you arrive 10 or more minutes late.  We strive to give you quality time with our providers, and arriving late affects you and other patients whose appointments are after yours.  Also, if you no show three or more times for appointments you may be dismissed from the clinic at the providers discretion.     Again, thank you for choosing [facility].  Our hope is that these requests will decrease the amount of time that you wait before being seen by our physicians.       _____________________________________________________________  Should you have questions after your visit to [facility], please contact our office at [phone] between the hours of 8:30 a.m. and 4:30 p.m.  Voicemails left after 4:30 p.m. will not be returned until the following business day.  For prescription refill requests, have your pharmacy contact our office.         Resources For Cancer Patients and their Caregivers ? American  Cancer Society: Can assist with transportation, wigs, general needs, runs Look Good Feel Better.        770-286-7377 ? Cancer Care: Provides financial assistance, online support groups, medication/co-pay assistance.  1-800-813-HOPE (773) 394-4375) ? [place] Assists [place] cancer patients and their families through emotional , educational and financial support.  3437418713 ? [place] DSS Where to apply for food stamps, Medicaid and utility assistance. 262-850-8553 ? [facility]: Transportation to medical appointments. 4803301549 ? Social Security Administration: May apply for disability if have a Stage IV cancer. [phone] ? [employer], Disability and Transit Services: Assists with nutrition, care and transit needs. 406-068-7753

## 2016-01-23 ENCOUNTER — Ambulatory Visit (INDEPENDENT_AMBULATORY_CARE_PROVIDER_SITE_OTHER): Payer: Medicare Other | Admitting: *Deleted

## 2016-01-23 DIAGNOSIS — I480 Paroxysmal atrial fibrillation: Secondary | ICD-10-CM

## 2016-01-24 NOTE — Progress Notes (Signed)
Carelink Summary Report / Loop Recorder 

## 2016-02-11 LAB — CUP PACEART REMOTE DEVICE CHECK: MDC IDC SESS DTM: 20170406223828

## 2016-02-11 NOTE — Progress Notes (Signed)
Carelink summary report received. Battery status OK. Normal device function. No new symptom episodes, tachy episodes, brady, or pause episodes. 3.9% AF, +Eliquis, V rates relatively well controlled. Monthly summary reports and ROV/PRN

## 2016-02-20 ENCOUNTER — Ambulatory Visit (INDEPENDENT_AMBULATORY_CARE_PROVIDER_SITE_OTHER): Payer: Medicare Other | Admitting: *Deleted

## 2016-02-20 DIAGNOSIS — I480 Paroxysmal atrial fibrillation: Secondary | ICD-10-CM | POA: Diagnosis not present

## 2016-02-21 NOTE — Progress Notes (Signed)
Carelink Summary Report / Loop Recorder 

## 2016-02-29 LAB — CUP PACEART REMOTE DEVICE CHECK: Date Time Interrogation Session: 20170506230934

## 2016-03-06 ENCOUNTER — Encounter (HOSPITAL_COMMUNITY): Payer: Self-pay | Admitting: Nurse Practitioner

## 2016-03-06 ENCOUNTER — Ambulatory Visit (HOSPITAL_COMMUNITY)
Admission: RE | Admit: 2016-03-06 | Discharge: 2016-03-06 | Disposition: A | Discharge status: Home / Self Care | Payer: Medicare Other | Source: Ambulatory Visit | Attending: Nurse Practitioner | Admitting: Nurse Practitioner

## 2016-03-06 VITALS — BP 142/84 | HR 62 | Ht 63.0 in | Wt 200.0 lb

## 2016-03-06 DIAGNOSIS — Z923 Personal history of irradiation: Secondary | ICD-10-CM | POA: Diagnosis not present

## 2016-03-06 DIAGNOSIS — I480 Paroxysmal atrial fibrillation: Secondary | ICD-10-CM | POA: Diagnosis not present

## 2016-03-06 DIAGNOSIS — Z8249 Family history of ischemic heart disease and other diseases of the circulatory system: Secondary | ICD-10-CM | POA: Diagnosis not present

## 2016-03-06 DIAGNOSIS — Z87891 Personal history of nicotine dependence: Secondary | ICD-10-CM | POA: Insufficient documentation

## 2016-03-06 DIAGNOSIS — Z888 Allergy status to other drugs, medicaments and biological substances status: Secondary | ICD-10-CM | POA: Insufficient documentation

## 2016-03-06 DIAGNOSIS — I4891 Unspecified atrial fibrillation: Secondary | ICD-10-CM | POA: Insufficient documentation

## 2016-03-06 DIAGNOSIS — Z809 Family history of malignant neoplasm, unspecified: Secondary | ICD-10-CM | POA: Diagnosis not present

## 2016-03-06 DIAGNOSIS — Z853 Personal history of malignant neoplasm of breast: Secondary | ICD-10-CM | POA: Insufficient documentation

## 2016-03-06 DIAGNOSIS — Z7901 Long term (current) use of anticoagulants: Secondary | ICD-10-CM | POA: Diagnosis not present

## 2016-03-06 DIAGNOSIS — Z833 Family history of diabetes mellitus: Secondary | ICD-10-CM | POA: Diagnosis not present

## 2016-03-06 DIAGNOSIS — H409 Unspecified glaucoma: Secondary | ICD-10-CM | POA: Insufficient documentation

## 2016-03-06 DIAGNOSIS — Z79811 Long term (current) use of aromatase inhibitors: Secondary | ICD-10-CM | POA: Insufficient documentation

## 2016-03-06 DIAGNOSIS — Z823 Family history of stroke: Secondary | ICD-10-CM | POA: Insufficient documentation

## 2016-03-06 DIAGNOSIS — I4892 Unspecified atrial flutter: Secondary | ICD-10-CM | POA: Diagnosis not present

## 2016-03-06 DIAGNOSIS — Z79899 Other long term (current) drug therapy: Secondary | ICD-10-CM | POA: Insufficient documentation

## 2016-03-06 MED ORDER — SOTALOL HCL (AF) 80 MG PO TABS: 80.0000 mg | ORAL_TABLET | Freq: Two times a day (BID) | ORAL | Status: DC

## 2016-03-06 NOTE — Progress Notes (Signed)
Patient ID: Heidi Lopez, female   DOB: Nov 25, 1945, 70 y.o.   MRN: [id number]    Primary Care Physician: [name], MD Referring Physician: Dr. Melburn Hake Heidi Lopez is a 70 y.o. female with a h/o PAF and s/p ablation. She is on sotalol now for afib/atypical a flutter with RVR. She states  that she still has break through episodes  but now only about  10-30 mis at at time instead of hours. Episodes much more tolerable than in the past. Continues to be monitored with the Linq. Remote report reviewed from May and burden at 4.3%.  Today, she denies symptoms of palpitations, chest pain, shortness of breath, orthopnea, PND, lower extremity edema, dizziness, presyncope, syncope, or neurologic sequela. The patient is tolerating medications without difficulties and is otherwise without complaint today.   Past Medical History  Diagnosis Date  . Cancer of breast [facility]) July 2010    s/p XRT 2010 and again in 2011  . Glaucoma   . Cramps, muscle, general     severe breast cramping   . Night sweats   . Pain     breast pain  . Chronic coughing   . Sinus problem   . Wears glasses   . Glaucoma     left eye   . Joint pain     foot and knee pain  . Paroxysmal atrial fibrillation ([place]) 05/2014  . Dysrhythmia     atrial flutter  . Lymphedema of arm     left  . Osteopenia 01/24/2015  . Breast cancer [facility])    Past Surgical History  Procedure Laterality Date  . Tubal ligation  1984  . Colonoscopy N/A 02/05/2013    Procedure: COLONOSCOPY;  Surgeon: [name], MD;  [facility] ENDO [facility];  Service: Endoscopy;  Laterality: N/A;  830  . Cardioversion N/A 10/21/2014    Procedure: CARDIOVERSION;  Surgeon: [name], MD;  Location: [facility] ENDOSCOPY;  Service: Cardiovascular;  Laterality: N/A;  . Breast surgery  2010- right and 2011-left    for cancer  . Hysteroscopy w/d&c N/A 12/29/2014    Procedure: DILATATION AND CURETTAGE /HYSTEROSCOPY;  Surgeon: [name], MD;  Location: [facility] ORS;  Service:  Gynecology;  Laterality: N/A;  . Tee without cardioversion N/A 02/24/2015    Procedure: TRANSESOPHAGEAL ECHOCARDIOGRAM (TEE);  Surgeon: [name], MD;  Location: [facility] ENDOSCOPY;  Service: Cardiovascular;  Laterality: N/A;  . Electrophysiologic study N/A 02/24/2015    PVI and CTI ablation by Dr [name]  . Ep implantable device N/A 08/24/2015    Procedure: Loop Recorder Insertion;  Surgeon: Thompson Grayer, MD;  Location: [place] CV LAB;  Service: Cardiovascular;  Laterality: N/A;    Current Outpatient Prescriptions  Medication Sig Dispense Refill  . albuterol (PROVENTIL HFA;VENTOLIN HFA) 108 (90 BASE) MCG/ACT inhaler Inhale into the lungs every 6 (six) hours as needed for wheezing or shortness of breath. Reported on 11/28/2015    . apixaban (ELIQUIS) 5 MG TABS tablet Take 1 tablet (5 mg total) by mouth 2 (two) times daily. 180 tablet 3  . Calcium Carbonate-Vit D-Min (CALTRATE 600+D PLUS PO) Take 1 tablet by mouth daily.    . Cholecalciferol (VITAMIN D PO) Take 2,000 Units by mouth daily.     . Coenzyme Q10 (COQ-10) 200 MG CAPS Take 1 capsule by mouth daily.    . Cranberry 500 MG CAPS Take 500 mg by mouth 2 (two) times daily.     Marland Kitchen diltiazem (CARDIZEM CD) 240  MG 24 hr capsule Take 1 capsule (240 mg total) by mouth daily. 90 capsule 3  . diltiazem (CARDIZEM) 30 MG tablet Cardizem 30mg  -- take 1 tablet every 4 hours AS NEEDED for heart rate >100 as long as blood pressure >100. 45 tablet 1  . letrozole (FEMARA) 2.5 MG tablet Take 1 tablet (2.5 mg total) by mouth daily. 30 tablet 6  . loratadine (CLARITIN) 10 MG tablet Take 10 mg by mouth daily as needed. Reported on 09/20/2015    . LUMIGAN 0.01 % SOLN Place 1 drop into both eyes at bedtime.    . Multiple Vitamin (MULTIVITAMIN) tablet Take 1 tablet by mouth daily.     . Omega-3 Fatty Acids (FISH OIL) 1200 MG CAPS Take 1 capsule by mouth daily.    Marland Kitchen SOTALOL AF 80 MG TABS Take 1 tablet (80 mg total) by mouth 2 (two) times daily. 120 each 3  . brimonidine  (ALPHAGAN) 0.2 % ophthalmic solution Place 1 drop into the left eye 2 (two) times daily. Reported on 03/06/2016    . furosemide (LASIX) 20 MG tablet Take 1 tablet (20 mg total) by mouth daily as needed. Take daily as needed for swelling (Patient not taking: Reported on 03/06/2016) 90 tablet 3   No current facility-administered medications for this encounter.    Allergies  Allergen Reactions  . Metoprolol     Makes her feel bad  . Reglan [Metoclopramide] Other (See Comments)    Causes severe jitters    Social History   Social History  . Marital Status: Widowed    Spouse Name: N/A  . Number of Children: N/A  . Years of Education: N/A   Occupational History  . Not on file.   Social History Main Topics  . Smoking status: Former Smoker -- 1.00 packs/day for 2 years    Types: Cigarettes    Start date: 09/17/1962    Quit date: 11/16/1966  . Smokeless tobacco: Never Used  . Alcohol Use: No  . Drug Use: No  . Sexual Activity: Yes    Birth Control/ Protection: Post-menopausal   Other Topics Concern  . Not on file   Social History Narrative   Lives alone in [place]   Retired   Worked previously for [employer] and [employer]    Family History  Problem Relation Age of Onset  . Heart disease Mother   . Stroke Father   . Cancer Father   . Early death Brother   . Cancer Sister   . Cancer Sister   . Cancer Sister   . Diabetes Daughter     ROS- All systems are reviewed and negative except as per the HPI above  Physical Exam: Filed Vitals:   03/06/16 1525  BP: 142/84  Pulse: 62  Height: 5\' 3"  (1.6 m)  Weight: 200 lb (90.719 kg)    GEN- The patient is well appearing, alert and oriented x 3 today.   Head- normocephalic, atraumatic Eyes-  Sclera clear, conjunctiva pink Ears- hearing intact Oropharynx- clear Neck- supple, no JVP Lymph- no cervical lymphadenopathy Lungs- Clear to ausculation bilaterally, normal work of breathing Heart- Regular rate and rhythm, no  murmurs, rubs or gallops, PMI not laterally displaced GI- soft, NT, ND, + BS Extremities- no clubbing, cyanosis, or edema MS- no significant deformity or atrophy Skin- no rash or lesion Psych- euthymic mood, full affect Neuro- strength and sensation are intact  EKG- NSR at 62 bpm, , pr int 178 ms, qrs int 92 ms,  qtc 432 ms. Epic records reviewed  Assessment and Plan: 1. Afib/flutter Enjoying less afib/flutter ablation on sotalol No change for now Continue apixaban for chadsvasc score of at least 2  F/u in 3 months  Butch Penny C. Miku Udall, [facility] [address] [place], [place] [address] [phone]

## 2016-03-07 DIAGNOSIS — H40001 Preglaucoma, unspecified, right eye: Secondary | ICD-10-CM | POA: Diagnosis not present

## 2016-03-07 DIAGNOSIS — H401121 Primary open-angle glaucoma, left eye, mild stage: Secondary | ICD-10-CM | POA: Diagnosis not present

## 2016-03-07 DIAGNOSIS — H2513 Age-related nuclear cataract, bilateral: Secondary | ICD-10-CM | POA: Diagnosis not present

## 2016-03-21 ENCOUNTER — Ambulatory Visit (INDEPENDENT_AMBULATORY_CARE_PROVIDER_SITE_OTHER): Payer: Medicare Other | Admitting: *Deleted

## 2016-03-21 DIAGNOSIS — I480 Paroxysmal atrial fibrillation: Secondary | ICD-10-CM

## 2016-03-22 NOTE — Progress Notes (Signed)
Carelink Summary Report / Loop Recorder 

## 2016-03-29 LAB — CUP PACEART REMOTE DEVICE CHECK: MDC IDC SESS DTM: 20170605230747

## 2016-03-30 ENCOUNTER — Other Ambulatory Visit: Payer: Self-pay | Admitting: Obstetrics & Gynecology

## 2016-03-30 DIAGNOSIS — Z853 Personal history of malignant neoplasm of breast: Secondary | ICD-10-CM

## 2016-04-06 LAB — CUP PACEART REMOTE DEVICE CHECK: MDC IDC SESS DTM: 20170706000542

## 2016-04-12 ENCOUNTER — Other Ambulatory Visit (HOSPITAL_COMMUNITY): Payer: Self-pay | Admitting: Hematology & Oncology

## 2016-04-12 DIAGNOSIS — Z853 Personal history of malignant neoplasm of breast: Secondary | ICD-10-CM

## 2016-04-13 ENCOUNTER — Other Ambulatory Visit (HOSPITAL_COMMUNITY): Payer: Self-pay | Admitting: *Deleted

## 2016-04-13 DIAGNOSIS — Z853 Personal history of malignant neoplasm of breast: Secondary | ICD-10-CM

## 2016-04-13 MED ORDER — LETROZOLE 2.5 MG PO TABS: 2.5000 mg | ORAL_TABLET | Freq: Every day | ORAL | 1 refills | Status: DC

## 2016-04-20 ENCOUNTER — Ambulatory Visit (INDEPENDENT_AMBULATORY_CARE_PROVIDER_SITE_OTHER): Payer: Medicare Other | Admitting: *Deleted

## 2016-04-20 DIAGNOSIS — I480 Paroxysmal atrial fibrillation: Secondary | ICD-10-CM | POA: Diagnosis not present

## 2016-04-23 ENCOUNTER — Ambulatory Visit
Admission: RE | Admit: 2016-04-23 | Discharge: 2016-04-23 | Disposition: A | Discharge status: Home / Self Care | Payer: Medicare Other | Source: Ambulatory Visit | Attending: Obstetrics & Gynecology | Admitting: Obstetrics & Gynecology

## 2016-04-23 DIAGNOSIS — R928 Other abnormal and inconclusive findings on diagnostic imaging of breast: Secondary | ICD-10-CM | POA: Diagnosis not present

## 2016-04-23 DIAGNOSIS — Z853 Personal history of malignant neoplasm of breast: Secondary | ICD-10-CM

## 2016-04-23 NOTE — Progress Notes (Signed)
Carelink Summary Report / Loop Recorder 

## 2016-04-30 LAB — CUP PACEART REMOTE DEVICE CHECK: MDC IDC SESS DTM: 20170805003955

## 2016-05-22 ENCOUNTER — Ambulatory Visit (INDEPENDENT_AMBULATORY_CARE_PROVIDER_SITE_OTHER): Payer: Medicare Other | Admitting: *Deleted

## 2016-05-22 DIAGNOSIS — I480 Paroxysmal atrial fibrillation: Secondary | ICD-10-CM | POA: Diagnosis not present

## 2016-05-22 NOTE — Progress Notes (Signed)
Carelink Summary Report / Loop Recorder 

## 2016-05-24 ENCOUNTER — Encounter (HOSPITAL_COMMUNITY): Payer: Medicare Other

## 2016-05-24 ENCOUNTER — Encounter (HOSPITAL_COMMUNITY): Payer: Medicare Other | Attending: Oncology | Admitting: Oncology

## 2016-05-24 ENCOUNTER — Encounter (HOSPITAL_COMMUNITY): Payer: Self-pay | Admitting: Oncology

## 2016-05-24 VITALS — BP 133/70 | HR 66 | Temp 97.7°F | Resp 18 | Wt 198.1 lb

## 2016-05-24 DIAGNOSIS — C50912 Malignant neoplasm of unspecified site of left female breast: Secondary | ICD-10-CM | POA: Diagnosis not present

## 2016-05-24 DIAGNOSIS — Z853 Personal history of malignant neoplasm of breast: Secondary | ICD-10-CM | POA: Diagnosis not present

## 2016-05-24 DIAGNOSIS — Z780 Asymptomatic menopausal state: Secondary | ICD-10-CM

## 2016-05-24 DIAGNOSIS — M8580 Other specified disorders of bone density and structure, unspecified site: Secondary | ICD-10-CM | POA: Insufficient documentation

## 2016-05-24 DIAGNOSIS — R309 Painful micturition, unspecified: Secondary | ICD-10-CM

## 2016-05-24 LAB — URINALYSIS, ROUTINE W REFLEX MICROSCOPIC
Bilirubin Urine: NEGATIVE
GLUCOSE, UA: NEGATIVE mg/dL
HGB URINE DIPSTICK: NEGATIVE
Ketones, ur: NEGATIVE mg/dL
LEUKOCYTES UA: NEGATIVE
Nitrite: NEGATIVE
PH: 5.5 (ref 5.0–8.0)
Protein, ur: NEGATIVE mg/dL

## 2016-05-24 LAB — COMPREHENSIVE METABOLIC PANEL
ALBUMIN: 4.2 g/dL (ref 3.5–5.0)
ALT: 38 U/L (ref 14–54)
AST: 32 U/L (ref 15–41)
Alkaline Phosphatase: 73 U/L (ref 38–126)
Anion gap: 3 — ABNORMAL LOW (ref 5–15)
BILIRUBIN TOTAL: 0.6 mg/dL (ref 0.3–1.2)
BUN: 10 mg/dL (ref 6–20)
CHLORIDE: 106 mmol/L (ref 101–111)
CO2: 24 mmol/L (ref 22–32)
Calcium: 9 mg/dL (ref 8.9–10.3)
Creatinine, Ser: 0.63 mg/dL (ref 0.44–1.00)
GFR calc Af Amer: >60 mL/min (ref >60)
GFR calc non Af Amer: >60 mL/min (ref >60)
GLUCOSE: 105 mg/dL — AB (ref 65–99)
POTASSIUM: 4.2 mmol/L (ref 3.5–5.1)
SODIUM: 133 mmol/L — AB (ref 135–145)
TOTAL PROTEIN: 7 g/dL (ref 6.5–8.1)

## 2016-05-24 LAB — CBC WITH DIFFERENTIAL/PLATELET
BASOS ABS: 0.1 10*3/uL (ref 0.0–0.1)
BASOS PCT: 1 %
EOS ABS: 0.2 10*3/uL (ref 0.0–0.7)
Eosinophils Relative: 2 %
HEMATOCRIT: 39.9 % (ref 36.0–46.0)
Hemoglobin: 13.6 g/dL (ref 12.0–15.0)
Lymphocytes Relative: 39 %
Lymphs Abs: 3.5 10*3/uL (ref 0.7–4.0)
MCH: 30.8 pg (ref 26.0–34.0)
MCHC: 34.1 g/dL (ref 30.0–36.0)
MCV: 90.5 fL (ref 78.0–100.0)
MONO ABS: 0.9 10*3/uL (ref 0.1–1.0)
Monocytes Relative: 10 %
NEUTROS ABS: 4.3 10*3/uL (ref 1.7–7.7)
NEUTROS PCT: 48 %
Platelets: 213 10*3/uL (ref 150–400)
RBC: 4.41 MIL/uL (ref 3.87–5.11)
RDW: 12.8 % (ref 11.5–15.5)
WBC: 8.9 10*3/uL (ref 4.0–10.5)

## 2016-05-24 NOTE — Assessment & Plan Note (Addendum)
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 Estrogen receptor +95% progesterone receptor +96% Ki-67 marker low at 9% HER-2/neu not amplified status post lumpectomy on 04/26/2010, followed by radiation therapy finishing on 07/21/2010.  She started tamoxifen on 08/16/2010. 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 Estrogen receptors were 96% positive, progesterone 33% positive Ki-67 marker low 8% HER-2/neu negative and she did not receive any hormonal therapy after the radiation on the right side. AND BCI testing on 12/26/2013- high risk of late recurrence of 5.6% between years 5-10, high likelihood of benefit from extended endocrine therapy, 67% relative risk reduction with extended endocrine therapy, intermediate risk of overall recurrent of 10.1% between years 0-10. AND Intolerance to Tamoxifen, Arimidex   Labs today: CBC diff, CMET.  I personally reviewed and went over laboratory results with the patient.  The results are noted within this dictation.  I personally reviewed and went over radiographic studies with the patient.  The results are noted within this dictation.  Mammogram on 04/23/2016 was BIRADS 2.    Labs in 6 months: CBC diff, CMET.   She notes some "bladder cramping."  She denies any other complaints or any other descriptors of this discomfort.  I will order a UA.  Return in 6 months for follow-up.

## 2016-05-24 NOTE — Patient Instructions (Signed)
[place] at [facility] Discharge Instructions  RECOMMENDATIONS MADE BY THE CONSULTANT AND ANY TEST RESULTS WILL BE SENT TO YOUR REFERRING PHYSICIAN.  You were seen by Gershon Mussel today. Continue daily vitamins Bone density exam in 6 months  Return in 6 months for follow up.  Thank you for choosing [place] at [facility] to provide your oncology and hematology care.  To afford each patient quality time with our provider, please arrive at least 15 minutes before your scheduled appointment time.   Beginning January 23rd 2017 lab work for the [facility] will be done in the  Main lab at [facility] on 1st floor. If you have a lab appointment with the [facility] please come in thru the  Main Entrance and check in at the main information desk  You need to re-schedule your appointment should you arrive 10 or more minutes late.  We strive to give you quality time with our providers, and arriving late affects you and other patients whose appointments are after yours.  Also, if you no show three or more times for appointments you may be dismissed from the clinic at the providers discretion.     Again, thank you for choosing [facility].  Our hope is that these requests will decrease the amount of time that you wait before being seen by our physicians.       _____________________________________________________________  Should you have questions after your visit to [facility], please contact our office at [phone] between the hours of 8:30 a.m. and 4:30 p.m.  Voicemails left after 4:30 p.m. will not be returned until the following business day.  For prescription refill requests, have your pharmacy contact our office.         Resources For Cancer Patients and their Caregivers ? American Cancer Society: Can assist with transportation, wigs, general needs, runs Look Good Feel Better.        6103838249 ? Cancer  Care: Provides financial assistance, online support groups, medication/co-pay assistance.  1-800-813-HOPE 210-670-4304) ? [facility] Assists [place] cancer patients and their families through emotional , educational and financial support.  817-736-3132 ? [place] DSS Where to apply for food stamps, Medicaid and utility assistance. 856-246-2042 ? RCATS: Transportation to medical appointments. (972)160-5123 ? Social Security Administration: May apply for disability if have a Stage IV cancer. 684 163 9019 (610)550-1842 ? [employer], Disability and Transit Services: Assists with nutrition, care and transit needs. [place] Support Programs: @10RELATIVEDAYS @ > Cancer Support Group  2nd Tuesday of the month 1pm-2pm, Journey Room  > Creative Journey  3rd Tuesday of the month 1130am-1pm, Journey Room  > Look Good Feel Better  1st Wednesday of the month 10am-12 noon, Journey Room (Call [place] to register [phone])

## 2016-05-24 NOTE — Assessment & Plan Note (Addendum)
Osteopenia on bone density exam on 06/30/2012 while on an aromatase inhibitor.  She has completed her dental work and therefore we reviewed her bone density exam from 2016 demonstrating osteopenia.  We discussed treatment options including continuation of Ca++ and Vit D +/- the addition of Prolia.  I reviewed the risks, benefits, alternatives, and side effects of Prolia intervention including ONJ and hypocalcemia.  She is currently taking 600 mg of Ca++ and I have asked her to increase this to 1000- 1200 mg daily.  She is taking an adequate amount of Vit D.  She is not interested in Prolia at this time.  She has heard is concerned that this intervention may cause increased infection rate.  She is advised that it should not.  Bone density exam in 6 months.  If bone density is worse in 6 months, we can discuss other options including Fosamax.

## 2016-05-24 NOTE — Progress Notes (Signed)
Heidi Lopez, [place] [address]  History of breast cancer, bilateral: Right T1N0 Lumpectomy July 2010, Left T1c, N0 Lumpectomy 04/26/2010. - Plan: CBC with Differential, Comprehensive metabolic panel, Vitamin D 25 hydroxy  Osteopenia - Plan: DG Bone Density  Post-menopausal - Plan: DG Bone Density  Urinary pain - Plan: Urinalysis, Routine w reflex microscopic, Urinalysis, Routine w reflex microscopic  CURRENT THERAPY: Femara  INTERVAL HISTORY: Heidi Lopez 70 y.o. 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 Estrogen receptor +95% progesterone receptor +96% Ki-67 marker low at 9% HER-2/neu not amplified status post lumpectomy on 04/26/2010, followed by radiation therapy finishing on 07/21/2010.  She started tamoxifen on 08/16/2010. 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 Estrogen receptors were 96% positive, progesterone 33% positive Ki-67 marker low 8% HER-2/neu negative and she did not receive any hormonal therapy after the radiation on the right side. AND BCI testing on 12/26/2013- high risk of late recurrence of 5.6% between years 5-10, high likelihood of benefit from extended endocrine therapy, 67% relative risk reduction with extended endocrine therapy, intermediate risk of overall recurrent of 10.1% between years 0-10. AND Intolerance to Tamoxifen, Arimidex AND  Osteopenia in the setting of AI therapy.   On Ca++ and Vit D.  She is tolerating Femara well.  She does not some increased joint pain in her hands and wrist.  This may be from the Femara.  She wants to continue with this medication at this time.   We discussed her osteopenia.  She is not interested in prolia intervention due to "side effects."  She  is on Ca++ and Vit D.  Review of Systems  Constitutional: Negative.  Negative for chills, fever and weight loss.  HENT: Negative.   Eyes: Negative.  Negative for blurred vision.  Respiratory: Negative.  Negative for cough.   Cardiovascular: Negative.  Negative for chest pain.  Gastrointestinal: Negative.   Genitourinary: Negative.   Musculoskeletal: Positive for joint pain.  Skin: Negative.   Neurological: Negative.  Negative for weakness and headaches.  Endo/Heme/Allergies: Negative.   Psychiatric/Behavioral: Negative.     Past Medical History:  Diagnosis Date  . Breast cancer ([place])   . Cancer of breast [facility]) July 2010   s/p XRT 2010 and again in 2011  . Chronic coughing   . Cramps, muscle, general    severe breast cramping   . Dysrhythmia    atrial flutter  . Glaucoma   . Glaucoma    left eye   . Joint pain    foot and knee pain  . Lymphedema of arm    left  . Night sweats   . Osteopenia 01/24/2015  . Pain    breast pain  . Paroxysmal atrial fibrillation ([place]) 05/2014  . Sinus problem   . Wears glasses     Past Surgical History:  Procedure Laterality Date  . BREAST SURGERY  2010- right and 2011-left   for cancer  . CARDIOVERSION N/A 10/21/2014   Procedure: CARDIOVERSION;  Surgeon: [name], MD;  Location: [place];  Service: Cardiovascular;  Laterality: N/A;  . COLONOSCOPY N/A 02/05/2013   Procedure: COLONOSCOPY;  Surgeon: [name], MD;  [facility];  Service: Endoscopy;  Laterality: N/A;  830  . ELECTROPHYSIOLOGIC STUDY N/A 02/24/2015   PVI and CTI ablation by Dr [name]  . EP IMPLANTABLE DEVICE N/A 08/24/2015   Procedure: Loop Recorder Insertion;  Surgeon: Thompson Grayer, MD;  Location: [place] CV LAB;  Service: Cardiovascular;  Laterality: N/A;  . HYSTEROSCOPY W/D&C N/A 12/29/2014   Procedure: DILATATION AND CURETTAGE /HYSTEROSCOPY;  Surgeon: [name], MD;  Location: [facility] ORS;  Service: Gynecology;  Laterality: N/A;  . TEE  WITHOUT CARDIOVERSION N/A 02/24/2015   Procedure: TRANSESOPHAGEAL ECHOCARDIOGRAM (TEE);  Surgeon: [name], MD;  Location: [facility] ENDOSCOPY;  Service: Cardiovascular;  Laterality: N/A;  . TUBAL LIGATION  1984    Family History  Problem Relation Age of Onset  . Heart disease Mother   . Stroke Father   . Cancer Father   . Early death Brother   . Cancer Sister   . Cancer Sister   . Cancer Sister   . Diabetes Daughter     Social History   Social History  . Marital status: Widowed    Spouse name: N/A  . Number of children: N/A  . Years of education: N/A   Social History Main Topics  . Smoking status: Former Smoker    Packs/day: 1.00    Years: 2.00    Types: Cigarettes    Start date: 09/17/1962    Quit date: 11/16/1966  . Smokeless tobacco: Never Used  . Alcohol use No  . Drug use: No  . Sexual activity: Yes    Birth control/ protection: Post-menopausal   Other Topics Concern  . None   Social History Narrative   Lives alone in [place]   Retired   Worked previously for [employer] and [employer]     PHYSICAL EXAMINATION  ECOG PERFORMANCE STATUS: 0 - Asymptomatic  Vitals:   05/24/16 1400  BP: 133/70  Pulse: 66  Resp: 18  Temp: 97.7 F (36.5 C)    GENERAL:alert, no distress, well nourished, well developed, comfortable, cooperative, obese, smiling and unaccompanied SKIN: skin color, texture, turgor are normal, no rashes or significant lesions HEAD: Normocephalic, No masses, lesions, tenderness or abnormalities EYES: normal, EOMI, Conjunctiva are pink and non-injected EARS: External ears normal OROPHARYNX:lips, buccal mucosa, and tongue normal and mucous membranes are moist  NECK: supple, no adenopathy, thyroid normal size, non-tender, without nodularity, trachea midline LYMPH:  no palpable lymphadenopathy, no hepatosplenomegaly BREAST:B/L post-lumpectomy site well healed and free of suspicious changes LUNGS: clear to auscultation and percussion HEART: regular  rate & rhythm, no murmurs and no gallops ABDOMEN:abdomen soft and normal bowel sounds BACK: Back symmetric, no curvature. EXTREMITIES:less then 2 second capillary refill, no joint deformities, effusion, or inflammation, no edema, no skin discoloration, no clubbing, no cyanosis  NEURO: alert & oriented x 3 with fluent speech, no focal motor/sensory deficits, gait normal   LABORATORY DATA: CBC    Component Value Date/Time   WBC 8.9 05/24/2016 1415   RBC 4.41 05/24/2016 1415   HGB 13.6 05/24/2016 1415   HCT 39.9 05/24/2016 1415   PLT 213 05/24/2016 1415   MCV 90.5 05/24/2016 1415   MCH 30.8 05/24/2016 1415   MCHC 34.1 05/24/2016 1415   RDW 12.8 05/24/2016 1415   LYMPHSABS 3.5 05/24/2016 1415   MONOABS 0.9 05/24/2016 1415   EOSABS 0.2 05/24/2016 1415   BASOSABS 0.1 05/24/2016 1415      Chemistry      Component Value Date/Time   NA 133 (L) 05/24/2016 1415   K 4.2 05/24/2016 1415  CL 106 05/24/2016 1415   CO2 24 05/24/2016 1415   BUN 10 05/24/2016 1415   CREATININE 0.63 05/24/2016 1415   CREATININE 0.69 10/17/2015 1533      Component Value Date/Time   CALCIUM 9.0 05/24/2016 1415   ALKPHOS 73 05/24/2016 1415   AST 32 05/24/2016 1415   ALT 38 05/24/2016 1415   BILITOT 0.6 05/24/2016 1415        PENDING LABS:   RADIOGRAPHIC STUDIES:  No results found.   PATHOLOGY:    ASSESSMENT AND PLAN:  History of breast cancer, bilateral: Right T1N0 Lumpectomy July 2010, Left T1c, N0 Lumpectomy 04/26/2010. 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 Estrogen receptor +95% progesterone receptor +96% Ki-67 marker low at 9% HER-2/neu not amplified status post lumpectomy on 04/26/2010, followed by radiation therapy finishing on 07/21/2010.  She started tamoxifen on 08/16/2010. 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 Estrogen receptors were 96% positive, progesterone 33% positive Ki-67 marker low 8% HER-2/neu negative and she did not receive any hormonal therapy after the radiation on the right side. AND BCI testing on 12/26/2013- high risk of late recurrence of 5.6% between years 5-10, high likelihood of benefit from extended endocrine therapy, 67% relative risk reduction with extended endocrine therapy, intermediate risk of overall recurrent of 10.1% between years 0-10. AND Intolerance to Tamoxifen, Arimidex   Labs today: CBC diff, CMET.  I personally reviewed and went over laboratory results with the patient.  The results are noted within this dictation.  I personally reviewed and went over radiographic studies with the patient.  The results are noted within this dictation.  Mammogram on 04/23/2016 was BIRADS 2.    Labs in 6 months: CBC diff, CMET.   She notes some "bladder cramping."  She denies any other complaints or any other descriptors of this discomfort.  I will order a UA.  Return in 6 months for follow-up.  Osteopenia Osteopenia on bone density exam on 06/30/2012 while on an aromatase inhibitor.  She has completed her dental work and therefore we reviewed her bone density exam from 2016 demonstrating osteopenia.  We discussed treatment options including continuation of Ca++ and Vit D +/- the addition of Prolia.  I reviewed the risks, benefits, alternatives, and side effects of Prolia intervention including ONJ and hypocalcemia.  She is currently taking 600 mg of Ca++ and I have asked her to increase this to 1000- 1200 mg daily.  She is taking an adequate amount of Vit D.  She is not interested in Prolia at this time.  She has heard is concerned that this intervention may cause increased infection rate.  She is advised that it should not.  Bone density exam in 6 months.  If bone density is worse in 6 months, we can discuss other options including  Fosamax.   ORDERS PLACED FOR THIS ENCOUNTER: Orders Placed This Encounter  Procedures  . DG Bone Density  . CBC with Differential  . Comprehensive metabolic panel  . Vitamin D 25 hydroxy  . Urinalysis, Routine w reflex microscopic    MEDICATIONS PRESCRIBED THIS ENCOUNTER: No orders of the defined types were placed in this encounter.   THERAPY PLAN:  NCCN guidelines recommends the following surveillance for invasive breast cancer (2.2017):  A. History and Physical exam 1-4 times per  year as clinically appropriate for 5 years, then annually.  B. Periodic screening for changes in family history and referral to genetics counseling as indicated  C. Educate, monitor, and refer to lymphedema management.  D. Mammography every 12 months  E. Routine imaging of reconstructed breast is not indicated.  F. In the absence of clinical signs and symptoms suggestive of recurrent disease, there is no indication for laboratory or imaging studies for metastases screening.  G. Women on Tamoxifen: annual gynecologic assessment every 12 months if uterus is present.  H. Women on aromatase inhibitor or who experience ovarian failure secondary to treatment should have monitoring of bone health with a bone mineral density determination at baseline and periodically thereafter.  I. Assess and encourage adherence to adjuvant endocrine therapy.  J. Evidence suggests that active lifestyle, healthy diet, limited alcohol intake, and achieving and maintaining an ideal body weight (20-25 BMI) may lead to optimal breast cancer outcomes.   All questions were answered. The patient knows to call the clinic with any problems, questions or concerns. We can certainly see the patient much sooner if necessary.  Patient and plan discussed with Dr. Ancil Linsey and she is in agreement with the aforementioned.   This note is electronically signed by: Doy Mince 05/25/2016 8:59 PM

## 2016-05-25 ENCOUNTER — Telehealth (HOSPITAL_COMMUNITY): Payer: Self-pay | Admitting: *Deleted

## 2016-05-25 NOTE — Telephone Encounter (Signed)
Pt aware labs and urine were good and to follow up with PCP if urinary symptoms continue. Pt verbalized understanding.

## 2016-05-25 NOTE — Telephone Encounter (Signed)
-----   Message from [facility], PA-C sent at 05/24/2016 10:55 PM EDT ----- Negative for any infection.  If continued urinary/bladder complaints, follow-up with PCP.

## 2016-06-05 ENCOUNTER — Ambulatory Visit (HOSPITAL_COMMUNITY)
Admission: RE | Admit: 2016-06-05 | Discharge: 2016-06-05 | Disposition: A | Discharge status: Home / Self Care | Payer: Medicare Other | Source: Ambulatory Visit | Attending: Nurse Practitioner | Admitting: Nurse Practitioner

## 2016-06-05 VITALS — BP 118/72 | HR 60 | Ht 63.0 in | Wt 194.2 lb

## 2016-06-05 DIAGNOSIS — I4892 Unspecified atrial flutter: Secondary | ICD-10-CM | POA: Insufficient documentation

## 2016-06-05 DIAGNOSIS — M8580 Other specified disorders of bone density and structure, unspecified site: Secondary | ICD-10-CM | POA: Diagnosis not present

## 2016-06-05 DIAGNOSIS — H409 Unspecified glaucoma: Secondary | ICD-10-CM | POA: Insufficient documentation

## 2016-06-05 DIAGNOSIS — Z87891 Personal history of nicotine dependence: Secondary | ICD-10-CM | POA: Diagnosis not present

## 2016-06-05 DIAGNOSIS — Z853 Personal history of malignant neoplasm of breast: Secondary | ICD-10-CM | POA: Diagnosis not present

## 2016-06-05 DIAGNOSIS — I480 Paroxysmal atrial fibrillation: Secondary | ICD-10-CM | POA: Diagnosis not present

## 2016-06-05 DIAGNOSIS — I4891 Unspecified atrial fibrillation: Secondary | ICD-10-CM | POA: Insufficient documentation

## 2016-06-06 NOTE — Progress Notes (Signed)
Patient ID: Heidi Lopez, female   DOB: [birth year], 70 y.o.   MRN: [id number]    Primary Care Physician: [name], MD Referring Physician: Dr. Melburn Hake Heidi Lopez is a 70 y.o. female with a h/o PAF and s/p ablation. She is on sotalol now for afib/atypical a flutter with RVR. She states  that she still has break through episodes  but now for short periods.  Episodes much more tolerable than in the past. Continues to be monitored with the Linq. Remote report reviewed from August and burden  Increased from 4.3% to 13.2%, but pt is not aware of any perceived increase in afib burden.  Today, she denies symptoms of palpitations, chest pain, shortness of breath, orthopnea, PND, lower extremity edema, dizziness, presyncope, syncope, or neurologic sequela. The patient is tolerating medications without difficulties and is otherwise without complaint today.   Past Medical History:  Diagnosis Date  . Breast cancer ([place])   . Cancer of breast [facility]) July 2010   s/p XRT 2010 and again in 2011  . Chronic coughing   . Cramps, muscle, general    severe breast cramping   . Dysrhythmia    atrial flutter  . Glaucoma   . Glaucoma    left eye   . Joint pain    foot and knee pain  . Lymphedema of arm    left  . Night sweats   . Osteopenia 01/24/2015  . Pain    breast pain  . Paroxysmal atrial fibrillation ([place]) 05/2014  . Sinus problem   . Wears glasses    Past Surgical History:  Procedure Laterality Date  . BREAST SURGERY  2010- right and 2011-left   for cancer  . CARDIOVERSION N/A 10/21/2014   Procedure: CARDIOVERSION;  Surgeon: [name], MD;  Location: [place];  Service: Cardiovascular;  Laterality: N/A;  . COLONOSCOPY N/A 02/05/2013   Procedure: COLONOSCOPY;  Surgeon: [name], MD;  [facility];  Service: Endoscopy;  Laterality: N/A;  830  . ELECTROPHYSIOLOGIC STUDY N/A 02/24/2015   PVI and CTI ablation by Dr [name]  . EP IMPLANTABLE DEVICE N/A 08/24/2015   Procedure: Loop Recorder Insertion;  Surgeon: Thompson Grayer, MD;  Location: [place] CV LAB;  Service: Cardiovascular;  Laterality: N/A;  . HYSTEROSCOPY W/D&C N/A 12/29/2014   Procedure: DILATATION AND CURETTAGE /HYSTEROSCOPY;  Surgeon: [name], MD;  [facility];  Service: Gynecology;  Laterality: N/A;  . TEE WITHOUT CARDIOVERSION N/A 02/24/2015   Procedure: TRANSESOPHAGEAL ECHOCARDIOGRAM (TEE);  Surgeon: [name], MD;  Location: [facility] ENDOSCOPY;  Service: Cardiovascular;  Laterality: N/A;  . TUBAL LIGATION  1984    Current Outpatient Prescriptions  Medication Sig Dispense Refill  . albuterol (PROVENTIL HFA;VENTOLIN HFA) 108 (90 BASE) MCG/ACT inhaler Inhale into the lungs every 6 (six) hours as needed for wheezing or shortness of breath. Reported on 11/28/2015    . apixaban (ELIQUIS) 5 MG TABS tablet Take 1 tablet (5 mg total) by mouth 2 (two) times daily. 180 tablet 3  . brimonidine (ALPHAGAN) 0.2 % ophthalmic solution Place 1 drop into the left eye 2 (two) times daily. Reported on 03/06/2016    . Calcium Carbonate-Vit D-Min (CALTRATE 600+D PLUS PO) Take 1 tablet by mouth daily.    . Cholecalciferol (VITAMIN D PO) Take 2,000 Units by mouth daily.     . Coenzyme Q10 (COQ-10) 200 MG CAPS Take 1 capsule by mouth daily.    . Cranberry 500 MG CAPS  Take 500 mg by mouth 2 (two) times daily.     Marland Kitchen diltiazem (CARDIZEM CD) 240 MG 24 hr capsule Take 1 capsule (240 mg total) by mouth daily. 90 capsule 3  . diltiazem (CARDIZEM) 30 MG tablet Cardizem 30mg  -- take 1 tablet every 4 hours AS NEEDED for heart rate >100 as long as blood pressure >100. 45 tablet 1  . furosemide (LASIX) 20 MG tablet Take 1 tablet (20 mg total) by mouth daily as needed. Take daily as needed for swelling 90 tablet 3  . letrozole (FEMARA) 2.5 MG tablet TAKE ONE TABLET BY MOUTH ONCE DAILY 30 tablet 0  . letrozole (FEMARA) 2.5 MG tablet Take 1 tablet (2.5 mg total) by mouth daily. 90 tablet 1  . loratadine (CLARITIN) 10 MG  tablet Take 10 mg by mouth daily as needed. Reported on 09/20/2015    . LUMIGAN 0.01 % SOLN Place 1 drop into both eyes at bedtime.    . Multiple Vitamin (MULTIVITAMIN) tablet Take 1 tablet by mouth daily.     . Omega-3 Fatty Acids (FISH OIL) 1200 MG CAPS Take 1 capsule by mouth daily.    Marland Kitchen SOTALOL AF 80 MG TABS Take 1 tablet (80 mg total) by mouth 2 (two) times daily. 120 each 3   No current facility-administered medications for this encounter.     Allergies  Allergen Reactions  . Metoprolol     Makes her feel bad  . Reglan [Metoclopramide] Other (See Comments)    Causes severe jitters    Social History   Social History  . Marital status: Widowed    Spouse name: N/A  . Number of children: N/A  . Years of education: N/A   Occupational History  . Not on file.   Social History Main Topics  . Smoking status: Former Smoker    Packs/day: 1.00    Years: 2.00    Types: Cigarettes    Start date: 09/17/1962    Quit date: 11/16/1966  . Smokeless tobacco: Never Used  . Alcohol use No  . Drug use: No  . Sexual activity: Yes    Birth control/ protection: Post-menopausal   Other Topics Concern  . Not on file   Social History Narrative   Lives alone in [place]   Retired   Worked previously for [employer] and [employer]    Family History  Problem Relation Age of Onset  . Heart disease Mother   . Stroke Father   . Cancer Father   . Early death Brother   . Cancer Sister   . Cancer Sister   . Cancer Sister   . Diabetes Daughter     ROS- All systems are reviewed and negative except as per the HPI above  Physical Exam: Vitals:   06/05/16 1522  BP: 118/72  Pulse: 60  Weight: 194 lb 3.2 oz (88.1 kg)  Height: 5\' 3"  (1.6 m)    GEN- The patient is well appearing, alert and oriented x 3 today.   Head- normocephalic, atraumatic Eyes-  Sclera clear, conjunctiva pink Ears- hearing intact Oropharynx- clear Neck- supple, no JVP Lymph- no cervical lymphadenopathy Lungs-  Clear to ausculation bilaterally, normal work of breathing Heart- Regular rate and rhythm, no murmurs, rubs or gallops, PMI not laterally displaced GI- soft, NT, ND, + BS Extremities- no clubbing, cyanosis, or edema MS- no significant deformity or atrophy Skin- no rash or lesion Psych- euthymic mood, full affect Neuro- strength and sensation are intact  EKG- NSR at  60 bpm, , pr int 178 ms, qrs int 88 ms, qtc 458 ms (stable) Epic records reviewed  Assessment and Plan: 1. Afib/flutter Less perceived  afib/flutter  episodes on sotalol but Linq shows increase in afib burden Continue sotalol 80 mg bid + cardizem 240 mg qd Continue apixaban for chadsvasc score of at least 2  F/u in 3 months  Butch Penny C. Luie Laneve, [facility] [address] [place], [place] [address] [phone]

## 2016-06-17 LAB — CUP PACEART REMOTE DEVICE CHECK: MDC IDC SESS DTM: 20170904004051

## 2016-06-17 NOTE — Progress Notes (Signed)
Carelink summary report received. Battery status OK. Normal device function. No new symptom episodes, tachy episodes, brady, or pause episodes. 54 AF 19.4% known PAF +Eliquis. Monthly summary reports and ROV/PRN

## 2016-06-19 ENCOUNTER — Ambulatory Visit (INDEPENDENT_AMBULATORY_CARE_PROVIDER_SITE_OTHER): Payer: Medicare Other | Admitting: *Deleted

## 2016-06-19 DIAGNOSIS — I480 Paroxysmal atrial fibrillation: Secondary | ICD-10-CM | POA: Diagnosis not present

## 2016-06-20 NOTE — Progress Notes (Signed)
Carelink Summary Report / Loop Recorder 

## 2016-07-03 DIAGNOSIS — Z23 Encounter for immunization: Secondary | ICD-10-CM | POA: Diagnosis not present

## 2016-07-19 ENCOUNTER — Ambulatory Visit (INDEPENDENT_AMBULATORY_CARE_PROVIDER_SITE_OTHER): Payer: Medicare Other | Admitting: *Deleted

## 2016-07-19 DIAGNOSIS — I480 Paroxysmal atrial fibrillation: Secondary | ICD-10-CM

## 2016-07-20 NOTE — Progress Notes (Signed)
Carelink Summary Report / Loop Recorder 

## 2016-07-26 DIAGNOSIS — Z853 Personal history of malignant neoplasm of breast: Secondary | ICD-10-CM | POA: Diagnosis not present

## 2016-07-28 LAB — CUP PACEART REMOTE DEVICE CHECK
Date Time Interrogation Session: 20171004023829
Implantable Pulse Generator Implant Date: 20161207

## 2016-07-28 NOTE — Progress Notes (Signed)
Carelink summary report received. Battery status OK. Normal device function. No new symptom episodes, tachy episodes, brady, or pause episodes. 90 AF 24.2% +sotalol +cardizem +eliquis. Monthly summary reports and ROV/PRN

## 2016-08-20 ENCOUNTER — Ambulatory Visit (INDEPENDENT_AMBULATORY_CARE_PROVIDER_SITE_OTHER): Payer: Medicare Other | Admitting: *Deleted

## 2016-08-20 DIAGNOSIS — I480 Paroxysmal atrial fibrillation: Secondary | ICD-10-CM | POA: Diagnosis not present

## 2016-08-20 NOTE — Progress Notes (Signed)
Carelink Summary Report / Loop Recorder 

## 2016-08-25 LAB — CUP PACEART REMOTE DEVICE CHECK
Implantable Pulse Generator Implant Date: 20161207
MDC IDC SESS DTM: 20171103030652

## 2016-08-25 NOTE — Progress Notes (Signed)
Carelink summary report received. Battery status OK. Normal device function. No new symptom episodes, tachy episodes, brady, or pause episodes. 92 AF 26.4% +sotalol +Eliquis. Monthly summary reports and ROV/PRN

## 2016-09-12 DIAGNOSIS — H401121 Primary open-angle glaucoma, left eye, mild stage: Secondary | ICD-10-CM | POA: Diagnosis not present

## 2016-09-12 DIAGNOSIS — H40001 Preglaucoma, unspecified, right eye: Secondary | ICD-10-CM | POA: Diagnosis not present

## 2016-09-17 ENCOUNTER — Other Ambulatory Visit (HOSPITAL_COMMUNITY): Payer: Self-pay | Admitting: Nurse Practitioner

## 2016-09-18 ENCOUNTER — Ambulatory Visit (INDEPENDENT_AMBULATORY_CARE_PROVIDER_SITE_OTHER): Payer: Medicare Other | Admitting: *Deleted

## 2016-09-18 DIAGNOSIS — I480 Paroxysmal atrial fibrillation: Secondary | ICD-10-CM | POA: Diagnosis not present

## 2016-09-19 NOTE — Progress Notes (Signed)
Carelink Summary Report 

## 2016-09-24 ENCOUNTER — Ambulatory Visit (HOSPITAL_COMMUNITY)
Admission: RE | Admit: 2016-09-24 | Discharge: 2016-09-24 | Disposition: A | Discharge status: Home / Self Care | Payer: Medicare Other | Source: Ambulatory Visit | Attending: Nurse Practitioner | Admitting: Nurse Practitioner

## 2016-09-24 ENCOUNTER — Encounter (HOSPITAL_COMMUNITY): Payer: Self-pay | Admitting: Nurse Practitioner

## 2016-09-24 VITALS — BP 126/74 | HR 66 | Ht 63.0 in | Wt 197.0 lb

## 2016-09-24 DIAGNOSIS — M8580 Other specified disorders of bone density and structure, unspecified site: Secondary | ICD-10-CM | POA: Insufficient documentation

## 2016-09-24 DIAGNOSIS — Z79899 Other long term (current) drug therapy: Secondary | ICD-10-CM | POA: Insufficient documentation

## 2016-09-24 DIAGNOSIS — Z87891 Personal history of nicotine dependence: Secondary | ICD-10-CM | POA: Diagnosis not present

## 2016-09-24 DIAGNOSIS — Z823 Family history of stroke: Secondary | ICD-10-CM | POA: Diagnosis not present

## 2016-09-24 DIAGNOSIS — I4892 Unspecified atrial flutter: Secondary | ICD-10-CM | POA: Diagnosis not present

## 2016-09-24 DIAGNOSIS — Z9889 Other specified postprocedural states: Secondary | ICD-10-CM | POA: Diagnosis not present

## 2016-09-24 DIAGNOSIS — H409 Unspecified glaucoma: Secondary | ICD-10-CM | POA: Diagnosis not present

## 2016-09-24 DIAGNOSIS — Z853 Personal history of malignant neoplasm of breast: Secondary | ICD-10-CM | POA: Diagnosis not present

## 2016-09-24 DIAGNOSIS — Z809 Family history of malignant neoplasm, unspecified: Secondary | ICD-10-CM | POA: Insufficient documentation

## 2016-09-24 DIAGNOSIS — Z833 Family history of diabetes mellitus: Secondary | ICD-10-CM | POA: Diagnosis not present

## 2016-09-24 DIAGNOSIS — Z8249 Family history of ischemic heart disease and other diseases of the circulatory system: Secondary | ICD-10-CM | POA: Insufficient documentation

## 2016-09-24 DIAGNOSIS — I480 Paroxysmal atrial fibrillation: Secondary | ICD-10-CM | POA: Diagnosis not present

## 2016-09-24 DIAGNOSIS — Z888 Allergy status to other drugs, medicaments and biological substances status: Secondary | ICD-10-CM | POA: Diagnosis not present

## 2016-09-24 DIAGNOSIS — I484 Atypical atrial flutter: Secondary | ICD-10-CM | POA: Diagnosis not present

## 2016-09-24 LAB — BASIC METABOLIC PANEL
ANION GAP: 8 (ref 5–15)
BUN: 7 mg/dL (ref 6–20)
CALCIUM: 9.6 mg/dL (ref 8.9–10.3)
CO2: 24 mmol/L (ref 22–32)
Chloride: 104 mmol/L (ref 101–111)
Creatinine, Ser: 0.69 mg/dL (ref 0.44–1.00)
GFR calc Af Amer: >60 mL/min (ref >60)
GFR calc non Af Amer: >60 mL/min (ref >60)
GLUCOSE: 93 mg/dL (ref 65–99)
Potassium: 4.2 mmol/L (ref 3.5–5.1)
Sodium: 136 mmol/L (ref 135–145)

## 2016-09-24 LAB — MAGNESIUM: Magnesium: 1.8 mg/dL (ref 1.7–2.4)

## 2016-09-24 MED ORDER — SOTALOL HCL (AF) 80 MG PO TABS: 1.0000 | ORAL_TABLET | Freq: Two times a day (BID) | ORAL | 2 refills | Status: DC

## 2016-09-24 MED ORDER — DILTIAZEM HCL ER COATED BEADS 240 MG PO CP24: 240.0000 mg | ORAL_CAPSULE | Freq: Every day | ORAL | 2 refills | Status: DC

## 2016-09-24 NOTE — Progress Notes (Signed)
Patient ID: Heidi Lopez, female   DOB: 1946-02-17, 71 y.o.   MRN: [id number]    Primary Care Physician: [name], MD Referring Physician: Dr. Melburn Hake Heidi Lopez is a 71 y.o. female with a h/o PAF and s/p ablation. She is on sotalol now for afib/atypical a flutter with RVR. She states  that she still has break through episodes  but now for short periods.  Episodes much more tolerable than in the past. Most usually last less than one hour, average more around 15 mins.  Continues to be monitored with the Linq. Remote report reviewed and burden  Increased from 13 % to 24 %, however, she dose not feel the majority of these episodes.  Today, she denies symptoms of palpitations, chest pain, shortness of breath, orthopnea, PND, lower extremity edema, dizziness, presyncope, syncope, or neurologic sequela. The patient is tolerating medications without difficulties and is otherwise without complaint today.   Past Medical History:  Diagnosis Date  . Breast cancer ([place])   . Cancer of breast [facility]) July 2010   s/p XRT 2010 and again in 2011  . Chronic coughing   . Cramps, muscle, general    severe breast cramping   . Dysrhythmia    atrial flutter  . Glaucoma   . Glaucoma    left eye   . Joint pain    foot and knee pain  . Lymphedema of arm    left  . Night sweats   . Osteopenia 01/24/2015  . Pain    breast pain  . Paroxysmal atrial fibrillation ([place]) 05/2014  . Sinus problem   . Wears glasses    Past Surgical History:  Procedure Laterality Date  . BREAST SURGERY  2010- right and 2011-left   for cancer  . CARDIOVERSION N/A 10/21/2014   Procedure: CARDIOVERSION;  Surgeon: [name], MD;  Location: [place];  Service: Cardiovascular;  Laterality: N/A;  . COLONOSCOPY N/A 02/05/2013   Procedure: COLONOSCOPY;  Surgeon: [name], MD;  [facility];  Service: Endoscopy;  Laterality: N/A;  830  . ELECTROPHYSIOLOGIC STUDY N/A 02/24/2015   PVI and CTI ablation by Dr  [name]  . EP IMPLANTABLE DEVICE N/A 08/24/2015   Procedure: Loop Recorder Insertion;  Surgeon: Thompson Grayer, MD;  Location: [place] CV LAB;  Service: Cardiovascular;  Laterality: N/A;  . HYSTEROSCOPY W/D&C N/A 12/29/2014   Procedure: DILATATION AND CURETTAGE /HYSTEROSCOPY;  Surgeon: [name], MD;  [facility];  Service: Gynecology;  Laterality: N/A;  . TEE WITHOUT CARDIOVERSION N/A 02/24/2015   Procedure: TRANSESOPHAGEAL ECHOCARDIOGRAM (TEE);  Surgeon: [name], MD;  Location: [facility] ENDOSCOPY;  Service: Cardiovascular;  Laterality: N/A;  . TUBAL LIGATION  1984    Current Outpatient Prescriptions  Medication Sig Dispense Refill  . albuterol (PROVENTIL HFA;VENTOLIN HFA) 108 (90 BASE) MCG/ACT inhaler Inhale into the lungs every 6 (six) hours as needed for wheezing or shortness of breath. Reported on 11/28/2015    . apixaban (ELIQUIS) 5 MG TABS tablet Take 1 tablet (5 mg total) by mouth 2 (two) times daily. 180 tablet 3  . brimonidine (ALPHAGAN) 0.2 % ophthalmic solution Place 1 drop into the left eye 2 (two) times daily. Reported on 03/06/2016    . Calcium Carbonate-Vit D-Min (CALTRATE 600+D PLUS PO) Take 1 tablet by mouth daily.    . Cholecalciferol (VITAMIN D PO) Take 2,000 Units by mouth daily.     . Coenzyme Q10 (COQ-10) 200 MG CAPS Take 1  capsule by mouth daily.    . Cranberry 500 MG CAPS Take 500 mg by mouth 2 (two) times daily.     Marland Kitchen diltiazem (CARDIZEM) 30 MG tablet Cardizem 30mg  -- take 1 tablet every 4 hours AS NEEDED for heart rate >100 as long as blood pressure >100. 45 tablet 1  . diltiazem (CARTIA XT) 240 MG 24 hr capsule Take 1 capsule (240 mg total) by mouth daily. 90 capsule 2  . furosemide (LASIX) 20 MG tablet Take 1 tablet (20 mg total) by mouth daily as needed. Take daily as needed for swelling 90 tablet 3  . letrozole (FEMARA) 2.5 MG tablet TAKE ONE TABLET BY MOUTH ONCE DAILY 30 tablet 0  . loratadine (CLARITIN) 10 MG tablet Take 10 mg by mouth daily as needed.  Reported on 09/20/2015    . LUMIGAN 0.01 % SOLN Place 1 drop into both eyes at bedtime.    . Multiple Vitamin (MULTIVITAMIN) tablet Take 1 tablet by mouth daily.     . Omega-3 Fatty Acids (FISH OIL) 1200 MG CAPS Take 1 capsule by mouth daily.    Marland Kitchen SOTALOL AF 80 MG TABS Take 1 tablet (80 mg total) by mouth 2 (two) times daily. 180 tablet 2   No current facility-administered medications for this encounter.     Allergies  Allergen Reactions  . Metoprolol     Makes her feel bad  . Reglan [Metoclopramide] Other (See Comments)    Causes severe jitters    Social History   Social History  . Marital status: Widowed    Spouse name: N/A  . Number of children: N/A  . Years of education: N/A   Occupational History  . Not on file.   Social History Main Topics  . Smoking status: Former Smoker    Packs/day: 1.00    Years: 2.00    Types: Cigarettes    Start date: 09/17/1962    Quit date: 11/16/1966  . Smokeless tobacco: Never Used  . Alcohol use No  . Drug use: No  . Sexual activity: Yes    Birth control/ protection: Post-menopausal   Other Topics Concern  . Not on file   Social History Narrative   Lives alone in [place]   Retired   Worked previously for [employer] and [employer]    Family History  Problem Relation Age of Onset  . Heart disease Mother   . Stroke Father   . Cancer Father   . Early death Brother   . Cancer Sister   . Cancer Sister   . Cancer Sister   . Diabetes Daughter     ROS- All systems are reviewed and negative except as per the HPI above  Physical Exam: Vitals:   09/24/16 1445  BP: 126/74  Pulse: 66  Weight: 197 lb (89.4 kg)  Height: 5\' 3"  (1.6 m)    GEN- The patient is well appearing, alert and oriented x 3 today.   Head- normocephalic, atraumatic Eyes-  Sclera clear, conjunctiva pink Ears- hearing intact Oropharynx- clear Neck- supple, no JVP Lymph- no cervical lymphadenopathy Lungs- Clear to ausculation bilaterally, normal work of  breathing Heart- Regular rate and rhythm, no murmurs, rubs or gallops, PMI not laterally displaced GI- soft, NT, ND, + BS Extremities- no clubbing, cyanosis, or edema MS- no significant deformity or atrophy Skin- no rash or lesion Psych- euthymic mood, full affect Neuro- strength and sensation are intact  EKG- NSR at 66 bpm, pr int 156 ms, qrs int 84  ms, qtc 450 ms (stable) Epic records reviewed  Assessment and Plan: 1. Afib/flutter Less perceived  afib/flutter  episodes on sotalol but Linq shows increase in afib burden Continue sotalol 80 mg bid + cardizem 240 mg qd Continue apixaban for chadsvasc score of at least 2 bmet/mag  F/u in 3 months  Butch Penny C. [place], [facility] [address] [place], [place] [address] [phone]

## 2016-09-25 ENCOUNTER — Other Ambulatory Visit (HOSPITAL_COMMUNITY): Payer: Self-pay | Admitting: *Deleted

## 2016-09-25 MED ORDER — MAGNESIUM 250 MG PO TABS: 250.0000 mg | ORAL_TABLET | Freq: Every day | ORAL | 0 refills | Status: DC

## 2016-10-06 LAB — CUP PACEART REMOTE DEVICE CHECK
Date Time Interrogation Session: 20171203033716
MDC IDC PG IMPLANT DT: 20161207

## 2016-10-06 NOTE — Progress Notes (Signed)
Carelink summary report received. Battery status OK. Normal device function. No new symptom episodes, tachy episodes, brady, or pause episodes. 63 AF 20.1% +Sotalol +Eliquis +Diltiazem. Monthly summary reports and ROV/PRN

## 2016-10-17 ENCOUNTER — Ambulatory Visit (INDEPENDENT_AMBULATORY_CARE_PROVIDER_SITE_OTHER): Payer: Medicare Other | Admitting: *Deleted

## 2016-10-17 DIAGNOSIS — I480 Paroxysmal atrial fibrillation: Secondary | ICD-10-CM

## 2016-10-18 NOTE — Progress Notes (Signed)
Carelink Summary Report / Loop Recorder 

## 2016-10-22 DIAGNOSIS — J069 Acute upper respiratory infection, unspecified: Secondary | ICD-10-CM | POA: Diagnosis not present

## 2016-10-22 DIAGNOSIS — Z6833 Body mass index (BMI) 33.0-33.9, adult: Secondary | ICD-10-CM | POA: Diagnosis not present

## 2016-10-29 LAB — CUP PACEART REMOTE DEVICE CHECK
Date Time Interrogation Session: 20180102043613
MDC IDC PG IMPLANT DT: 20161207

## 2016-11-08 ENCOUNTER — Telehealth: Payer: Self-pay | Admitting: Cardiology

## 2016-11-08 NOTE — Telephone Encounter (Signed)
LMOVM requesting that pt send manual transmission b/c home monitor has not updated in at least 14 days.    

## 2016-11-15 LAB — CUP PACEART REMOTE DEVICE CHECK
Implantable Pulse Generator Implant Date: 20161207
MDC IDC SESS DTM: 20180201043718

## 2016-11-16 ENCOUNTER — Other Ambulatory Visit (HOSPITAL_COMMUNITY): Payer: Self-pay | Admitting: Oncology

## 2016-11-16 ENCOUNTER — Ambulatory Visit (INDEPENDENT_AMBULATORY_CARE_PROVIDER_SITE_OTHER): Payer: Medicare Other | Admitting: *Deleted

## 2016-11-16 DIAGNOSIS — I480 Paroxysmal atrial fibrillation: Secondary | ICD-10-CM

## 2016-11-16 DIAGNOSIS — Z853 Personal history of malignant neoplasm of breast: Secondary | ICD-10-CM

## 2016-11-19 NOTE — Progress Notes (Signed)
Carelink Summary Report / Loop Recorder 

## 2016-11-22 ENCOUNTER — Ambulatory Visit (HOSPITAL_COMMUNITY)
Admission: RE | Admit: 2016-11-22 | Discharge: 2016-11-22 | Disposition: A | Discharge status: Home / Self Care | Payer: Medicare Other | Source: Ambulatory Visit | Attending: Oncology | Admitting: Oncology

## 2016-11-22 DIAGNOSIS — M8589 Other specified disorders of bone density and structure, multiple sites: Secondary | ICD-10-CM | POA: Insufficient documentation

## 2016-11-22 DIAGNOSIS — M8580 Other specified disorders of bone density and structure, unspecified site: Secondary | ICD-10-CM | POA: Insufficient documentation

## 2016-11-22 DIAGNOSIS — M85832 Other specified disorders of bone density and structure, left forearm: Secondary | ICD-10-CM | POA: Diagnosis not present

## 2016-11-22 DIAGNOSIS — Z780 Asymptomatic menopausal state: Secondary | ICD-10-CM | POA: Diagnosis not present

## 2016-11-22 DIAGNOSIS — M85852 Other specified disorders of bone density and structure, left thigh: Secondary | ICD-10-CM | POA: Diagnosis not present

## 2016-11-23 ENCOUNTER — Encounter (HOSPITAL_COMMUNITY): Payer: Medicare Other | Attending: Oncology | Admitting: Oncology

## 2016-11-23 ENCOUNTER — Encounter (HOSPITAL_COMMUNITY): Payer: Self-pay

## 2016-11-23 ENCOUNTER — Ambulatory Visit (HOSPITAL_COMMUNITY): Payer: Medicare Other | Admitting: Oncology

## 2016-11-23 ENCOUNTER — Other Ambulatory Visit (HOSPITAL_COMMUNITY): Payer: Self-pay

## 2016-11-23 ENCOUNTER — Encounter (HOSPITAL_COMMUNITY): Payer: Medicare Other | Attending: Oncology

## 2016-11-23 VITALS — BP 122/64 | HR 59 | Temp 97.7°F | Resp 18 | Wt 200.7 lb

## 2016-11-23 DIAGNOSIS — M8580 Other specified disorders of bone density and structure, unspecified site: Secondary | ICD-10-CM

## 2016-11-23 DIAGNOSIS — Z853 Personal history of malignant neoplasm of breast: Secondary | ICD-10-CM | POA: Insufficient documentation

## 2016-11-23 DIAGNOSIS — M2550 Pain in unspecified joint: Secondary | ICD-10-CM

## 2016-11-23 DIAGNOSIS — I480 Paroxysmal atrial fibrillation: Secondary | ICD-10-CM

## 2016-11-23 LAB — CBC WITH DIFFERENTIAL/PLATELET
BASOS PCT: 1 %
Basophils Absolute: 0.1 10*3/uL (ref 0.0–0.1)
Eosinophils Absolute: 0.2 10*3/uL (ref 0.0–0.7)
Eosinophils Relative: 2 %
HEMATOCRIT: 40 % (ref 36.0–46.0)
HEMOGLOBIN: 13.9 g/dL (ref 12.0–15.0)
LYMPHS ABS: 2.9 10*3/uL (ref 0.7–4.0)
Lymphocytes Relative: 32 %
MCH: 31 pg (ref 26.0–34.0)
MCHC: 34.8 g/dL (ref 30.0–36.0)
MCV: 89.3 fL (ref 78.0–100.0)
Monocytes Absolute: 1.1 10*3/uL — ABNORMAL HIGH (ref 0.1–1.0)
Monocytes Relative: 12 %
NEUTROS ABS: 4.9 10*3/uL (ref 1.7–7.7)
NEUTROS PCT: 53 %
Platelets: 216 10*3/uL (ref 150–400)
RBC: 4.48 MIL/uL (ref 3.87–5.11)
RDW: 13 % (ref 11.5–15.5)
WBC: 9.1 10*3/uL (ref 4.0–10.5)

## 2016-11-23 LAB — COMPREHENSIVE METABOLIC PANEL
ALBUMIN: 4.1 g/dL (ref 3.5–5.0)
ALK PHOS: 67 U/L (ref 38–126)
ALT: 37 U/L (ref 14–54)
ANION GAP: 8 (ref 5–15)
AST: 31 U/L (ref 15–41)
BILIRUBIN TOTAL: 0.5 mg/dL (ref 0.3–1.2)
BUN: 14 mg/dL (ref 6–20)
CALCIUM: 9.7 mg/dL (ref 8.9–10.3)
CO2: 23 mmol/L (ref 22–32)
CREATININE: 0.74 mg/dL (ref 0.44–1.00)
Chloride: 104 mmol/L (ref 101–111)
GFR calc Af Amer: >60 mL/min (ref >60)
GFR calc non Af Amer: >60 mL/min (ref >60)
GLUCOSE: 126 mg/dL — AB (ref 65–99)
Potassium: 4.2 mmol/L (ref 3.5–5.1)
Sodium: 135 mmol/L (ref 135–145)
TOTAL PROTEIN: 6.9 g/dL (ref 6.5–8.1)

## 2016-11-23 LAB — MAGNESIUM: Magnesium: 1.8 mg/dL (ref 1.7–2.4)

## 2016-11-23 NOTE — Patient Instructions (Signed)
[place] at [facility] Discharge Instructions  RECOMMENDATIONS MADE BY THE CONSULTANT AND ANY TEST RESULTS WILL BE SENT TO YOUR REFERRING PHYSICIAN.  You were seen today by Dr. Twana First Follow up in 6 months See Amy up front for appointments   Thank you for choosing [place] at [facility] to provide your oncology and hematology care.  To afford each patient quality time with our provider, please arrive at least 15 minutes before your scheduled appointment time.    If you have a lab appointment with the [facility] please come in thru the  Main Entrance and check in at the main information desk  You need to re-schedule your appointment should you arrive 10 or more minutes late.  We strive to give you quality time with our providers, and arriving late affects you and other patients whose appointments are after yours.  Also, if you no show three or more times for appointments you may be dismissed from the clinic at the providers discretion.     Again, thank you for choosing [facility].  Our hope is that these requests will decrease the amount of time that you wait before being seen by our physicians.       _____________________________________________________________  Should you have questions after your visit to [facility], please contact our office at [phone] between the hours of 8:30 a.m. and 4:30 p.m.  Voicemails left after 4:30 p.m. will not be returned until the following business day.  For prescription refill requests, have your pharmacy contact our office.       Resources For Cancer Patients and their Caregivers ? American Cancer Society: Can assist with transportation, wigs, general needs, runs Look Good Feel Better.        323-532-7985 ? Cancer Care: Provides financial assistance, online support groups, medication/co-pay assistance.  1-800-813-HOPE 947-240-1123) ? [employer] Assists [place] cancer patients and their families through emotional , educational and financial support.  (802)159-0036 ? [place] DSS Where to apply for food stamps, Medicaid and utility assistance. 575-339-1372 ? [facility]: Transportation to medical appointments. 443-014-0837 ? Social Security Administration: May apply for disability if have a Stage IV cancer. (307)481-4579 775 244 2808 ? [employer], Disability and Transit Services: Assists with nutrition, care and transit needs. [place] Support Programs: @10RELATIVEDAYS @ > Cancer Support Group  2nd Tuesday of the month 1pm-2pm, Journey Room  > Creative Journey  3rd Tuesday of the month 1130am-1pm, Journey Room  > Look Good Feel Better  1st Wednesday of the month 10am-12 noon, Journey Room (Call [facility] [place] to register [phone])

## 2016-11-23 NOTE — Progress Notes (Signed)
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 but for negative sentinel nodes. Estrogen receptor +95% progesterone receptor +96% Ki-67 marker low at 9% HER-2/neu not amplified status post lumpectomy followed by radiation therapy. Her surgery was 04/26/2010 and she started tamoxifen on 08/16/2010 after radiation therapy finished on 07/21/2010. She is having tremendous sweating and hot flashes .   Tamoxifen 10 mg daily started by Dr. 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 Estrogen receptors were 96% positive, progesterone  33% positive Ki-67 marker low 8% HER-2/neu negative and she did not receive any hormonal therapy after the radiation on the right side.  DEXA on 06/30/2012 with osteopenia but a noted INCREASE in BMD   Breast Cancer Index with high likelihood of benefit from extended endocrine therapy. BCI prognostic high risk category.   CURRENT THERAPY: femara  INTERVAL HISTORY: Heidi Lopez 71 y.o. female returns for follow-up of bilateral breast cancers.   She has been tolerating Femara well. Her hands have felt stiff, but it is tolerable. She is still taking calcium and vitamin D. Denies hot flashes, chest pain, SOB, abdominal pain, or any other concerns.   MEDICAL HISTORY: Past Medical History:  Diagnosis Date  . Breast cancer ([place])   . Cancer of breast [facility]) July 2010   s/p XRT 2010 and again in 2011  . Chronic coughing   . Cramps, muscle, general    severe breast cramping   . Dysrhythmia    atrial flutter  . Glaucoma   . Glaucoma    left eye   . Joint pain    foot and knee pain  . Lymphedema of arm    left  . Night sweats   . Osteopenia 01/24/2015  . Pain    breast pain  .  Paroxysmal atrial fibrillation ([place]) 05/2014  . Sinus problem   . Wears glasses     has History of breast cancer, bilateral: Right T1N0 Lumpectomy July 2010, Left T1c, N0 Lumpectomy 04/26/2010.; Pain in joint, ankle and foot; A-fib ([place]); Snoring; Atypical glandular cells of undetermined significance (AGUS) on cervical Pap smear; Thickened endometrium; Osteopenia; and Atrial flutter ([place]) on her problem list.     is allergic to metoprolol and reglan [metoclopramide].  [name] does not currently have medications on file.  SURGICAL HISTORY: Past Surgical History:  Procedure Laterality Date  . BREAST SURGERY  2010- right and 2011-left   for cancer  . CARDIOVERSION N/A 10/21/2014   Procedure: CARDIOVERSION;  Surgeon: [name], MD;  Location: [place];  Service: Cardiovascular;  Laterality: N/A;  . COLONOSCOPY N/A 02/05/2013   Procedure: COLONOSCOPY;  Surgeon: [name], MD;  [facility];  Service: Endoscopy;  Laterality: N/A;  830  . ELECTROPHYSIOLOGIC STUDY N/A 02/24/2015   PVI and CTI ablation by Dr [name]  . EP IMPLANTABLE DEVICE N/A 08/24/2015   Procedure: Loop Recorder Insertion;  Surgeon: Thompson Grayer, MD;  Location: [place] CV LAB;  Service: Cardiovascular;  Laterality: N/A;  . HYSTEROSCOPY W/D&C N/A 12/29/2014   Procedure: DILATATION AND CURETTAGE /HYSTEROSCOPY;  Surgeon: [name], MD;  [facility];  Service: Gynecology;  Laterality: N/A;  . TEE WITHOUT CARDIOVERSION N/A 02/24/2015   Procedure:  TRANSESOPHAGEAL ECHOCARDIOGRAM (TEE);  Surgeon: [name], MD;  Location: [place];  Service: Cardiovascular;  Laterality: N/A;  . TUBAL LIGATION  1984    SOCIAL HISTORY: Social History   Social History  . Marital status: Widowed    Spouse name: N/A  . Number of children: N/A  . Years of education: N/A   Occupational History  . Not on file.   Social History Main Topics  . Smoking status: Former Smoker    Packs/day: 1.00    Years: 2.00     Types: Cigarettes    Start date: 09/17/1962    Quit date: 11/16/1966  . Smokeless tobacco: Never Used  . Alcohol use No  . Drug use: No  . Sexual activity: Yes    Birth control/ protection: Post-menopausal   Other Topics Concern  . Not on file   Social History Narrative   Lives alone in [place]   Retired   Worked previously for [employer] and [employer]    FAMILY HISTORY: Family History  Problem Relation Age of Onset  . Heart disease Mother   . Stroke Father   . Cancer Father   . Early death Brother   . Cancer Sister   . Cancer Sister   . Cancer Sister   . Diabetes Daughter     Review of Systems  Constitutional: Negative.        No hot flashes  HENT: Negative.   Eyes: Negative.   Respiratory: Negative.  Negative for shortness of breath.   Cardiovascular: Negative.  Negative for chest pain.  Gastrointestinal: Negative.  Negative for abdominal pain.  Genitourinary: Negative.   Musculoskeletal:       Joint stiffness (hands)  Skin: Negative.   Neurological: Negative.   Endo/Heme/Allergies: Negative.   Psychiatric/Behavioral: Negative.   All other systems reviewed and are negative. 14 point review of systems was performed and is negative except as detailed under history of present illness and above  PHYSICAL EXAMINATION  ECOG PERFORMANCE STATUS: 0 - Asymptomatic  Vitals:   11/23/16 1407  BP: 122/64  Pulse: (!) 59  Resp: 18  Temp: 97.7 F (36.5 C)   Physical Exam  Constitutional: She is oriented to person, place, and time and well-developed, well-nourished, and in no distress.  HENT:  Head: Normocephalic and atraumatic.  Eyes: EOM are normal. Pupils are equal, round, and reactive to light.  Neck: Normal range of motion. Neck supple.  Cardiovascular: Normal rate, regular rhythm and normal heart sounds.   Pulmonary/Chest: Effort normal and breath sounds normal. Right breast exhibits no mass, no nipple discharge and no skin change. Left breast exhibits no  mass, no nipple discharge and no skin change.    Abdominal: Soft. Bowel sounds are normal.  Musculoskeletal: Normal range of motion.  Neurological: She is alert and oriented to person, place, and time. Gait normal.  Skin: Skin is warm and dry.  Nursing note and vitals reviewed.  LABORATORY DATA: I have reviewed the data as listed  CBC    Component Value Date/Time   WBC 9.1 11/23/2016 1335   RBC 4.48 11/23/2016 1335   HGB 13.9 11/23/2016 1335   HCT 40.0 11/23/2016 1335   PLT 216 11/23/2016 1335   MCV 89.3 11/23/2016 1335   MCH 31.0 11/23/2016 1335   MCHC 34.8 11/23/2016 1335   RDW 13.0 11/23/2016 1335   LYMPHSABS 2.9 11/23/2016 1335   MONOABS 1.1 (H) 11/23/2016 1335   EOSABS 0.2 11/23/2016 1335   BASOSABS 0.1 11/23/2016 1335  CMP     Component Value Date/Time   NA 136 09/24/2016 1514   K 4.2 09/24/2016 1514   CL 104 09/24/2016 1514   CO2 24 09/24/2016 1514   GLUCOSE 93 09/24/2016 1514   BUN 7 09/24/2016 1514   CREATININE 0.69 09/24/2016 1514   CREATININE 0.69 10/17/2015 1533   CALCIUM 9.6 09/24/2016 1514   PROT 7.0 05/24/2016 1415   ALBUMIN 4.2 05/24/2016 1415   AST 32 05/24/2016 1415   ALT 38 05/24/2016 1415   ALKPHOS 73 05/24/2016 1415   BILITOT 0.6 05/24/2016 1415   GFRNONAA >60 09/24/2016 1514   GFRAA >60 09/24/2016 1514   RADIOLOGY: I have reviewed the images below and agree with the reported results  DEXA Scan 11/22/2016 DualFemur Neck Left 11/22/2016 71.4 Osteopenia -1.4 0.848 g/cm2 2.8% - DualFemur Neck Left 02/15/2015 69.7 Osteopenia -1.5 0.825 g/cm2 -7.6% Yes DualFemur Neck Left 06/30/2012 67.1 Normal -1.0 0.893 g/cm2 0.2% - DualFemur Neck Left 05/24/2009 64.0 Osteopenia -1.1 0.891 g/cm2 - - - Left Forearm Radius 33% 11/22/2016 71.5 Osteopenia -2.3 0.551 g/cm2  ASSESSMENT: BMD as determined from Forearm Radius 33% is 0.551 g/cm2 with a T-Score of -2.3. This patient is considered osteopenic according to [facility]) criteria.  Compared with the prior study on 02/15/2015, the BMD of the left femoral neck shows no statistically significant change. (Lumbar spine was not utilized due to advanced degenerative changes.)  PATHOLOGY:L DIAGNOSIS  ASSESSMENT and THERAPY PLAN:   History of ER+ bilateral breast cancer Intolerance to '20mg'$  Tamoxifen secondary to hot flashes Intolerance to aromasin secondary to hot flashes Abnormal endometrial lining, with hysteroscopy and D&C without significant pathology BCI prognostic; high risk category, BCI predictive high likelihood of benefit from extended endocrine RX Osteopenia, on calcium and vitamin D Ongoing Dental Issues Femara Joint pain, tolerable   Clinically NED on exam today.  I reviewed the DEXA scan with the patient in detail. She has osteopenia. I recommended Prolia, but she declined. I discussed placement on oral fosamax, however patient refused as well.  She does not want to take anything other than calcium and vitamin D. Repeat DEXA scan every year.   Mammogram scheduled for August. She is aware of this appointment.   Continue Femara, tolerating well.   She will return for follow up in 6 months with CBC, CMP.   All questions were answered. The patient knows to call the clinic with any problems, questions or concerns. We can certainly see the patient much sooner if necessary.   This document serves as a record of services personally performed by Twana First, MD. It was created on her behalf by Martinique Casey, a trained medical scribe. The creation of this record is based on the scribe's personal observations and the provider's statements to them. This document has been checked and approved by the attending provider.  I have reviewed the above documentation for accuracy and completeness, and I agree with the above.  This note was electronically signed.

## 2016-11-24 LAB — VITAMIN D 25 HYDROXY (VIT D DEFICIENCY, FRACTURES): Vit D, 25-Hydroxy: 39.6 ng/mL (ref 30.0–100.0)

## 2016-11-26 ENCOUNTER — Other Ambulatory Visit (HOSPITAL_COMMUNITY): Payer: Self-pay | Admitting: Oncology

## 2016-11-26 DIAGNOSIS — M85832 Other specified disorders of bone density and structure, left forearm: Secondary | ICD-10-CM

## 2016-12-03 ENCOUNTER — Ambulatory Visit (INDEPENDENT_AMBULATORY_CARE_PROVIDER_SITE_OTHER): Payer: Medicare Other | Admitting: Internal Medicine

## 2016-12-03 ENCOUNTER — Encounter: Payer: Self-pay | Admitting: Internal Medicine

## 2016-12-03 ENCOUNTER — Telehealth: Payer: Self-pay | Admitting: *Deleted

## 2016-12-03 VITALS — BP 120/68 | HR 61 | Ht 63.0 in | Wt 201.6 lb

## 2016-12-03 DIAGNOSIS — Z01812 Encounter for preprocedural laboratory examination: Secondary | ICD-10-CM | POA: Diagnosis not present

## 2016-12-03 DIAGNOSIS — I480 Paroxysmal atrial fibrillation: Secondary | ICD-10-CM

## 2016-12-03 DIAGNOSIS — I495 Sick sinus syndrome: Secondary | ICD-10-CM | POA: Diagnosis not present

## 2016-12-03 LAB — CUP PACEART INCLINIC DEVICE CHECK
Implantable Pulse Generator Implant Date: 20161207
MDC IDC SESS DTM: 20180319165656

## 2016-12-03 NOTE — Patient Instructions (Addendum)
Medication Instructions: - Your physician recommends that you continue on your current medications as directed. Please refer to the Current Medication list given to you today.  Labwork: - Your physician recommends that you have lab work: the week of 12/24/16- BMP/CBC  Procedures/Testing: - Your physician has recommended that you have an a-fib ablation (01/03/17- a detailed letter of instructions will be mailed to you). Catheter ablation is a medical procedure used to treat some cardiac arrhythmias (irregular heartbeats). During catheter ablation, a long, thin, flexible tube is put into a blood vessel in your groin (upper thigh), or neck. This tube is called an ablation catheter. It is then guided to your heart through the blood vessel. Radio frequency waves destroy small areas of heart tissue where abnormal heartbeats may cause an arrhythmia to start.   - Your physician has requested that you have cardiac CT- 4/16 or 4/17. Cardiac computed tomography (CT) is a painless test that uses an x-ray machine to take clear, detailed pictures of your heart. For further information please visit [email or web address]. Please follow instruction sheet as given.   Follow-Up: - Your physician recommends that you schedule a follow-up appointment in: 4 weeks (from 01/03/17) with Roderic Palau, NP for Dr. [name] in the [facility].  - Your physician recommends that you schedule a follow-up appointment in: 3 months (from 01/03/17) with Dr. [name].   Any Additional Special Instructions Will Be Listed Below (If Applicable).     If you need a refill on your cardiac medications before your next appointment, please call your pharmacy.

## 2016-12-03 NOTE — Progress Notes (Signed)
PCP: [name], MD Primary Cardiologist: Dr Aram Beecham Heidi Lopez is a 71 y.o. female who presents today for routine electrophysiology followup.  The patient has recently had increasing frequency and duration of atrial fibrillation.  She is unaware of triggers/precipitants.  She reports fatigue and SOB with episodes.  She had pauses on ILR for which she was added on urgently today for follow-up.  She reports mild dizziness but denies presyncope or syncope.  Today, she denies symptoms of chest pain, shortness of breath,  lower extremity edema or neuro changes.  The patient is otherwise without complaint today.   Past Medical History:  Diagnosis Date  . Breast cancer ([place])   . Cancer of breast [facility]) July 2010   s/p XRT 2010 and again in 2011  . Chronic coughing   . Cramps, muscle, general    severe breast cramping   . Dysrhythmia    atrial flutter  . Glaucoma   . Glaucoma    left eye   . Joint pain    foot and knee pain  . Lymphedema of arm    left  . Night sweats   . Osteopenia 01/24/2015  . Pain    breast pain  . Paroxysmal atrial fibrillation ([place]) 05/2014  . Sinus problem   . Wears glasses    Past Surgical History:  Procedure Laterality Date  . BREAST SURGERY  2010- right and 2011-left   for cancer  . CARDIOVERSION N/A 10/21/2014   Procedure: CARDIOVERSION;  Surgeon: [name], MD;  Location: [place];  Service: Cardiovascular;  Laterality: N/A;  . COLONOSCOPY N/A 02/05/2013   Procedure: COLONOSCOPY;  Surgeon: [name], MD;  [facility];  Service: Endoscopy;  Laterality: N/A;  830  . ELECTROPHYSIOLOGIC STUDY N/A 02/24/2015   PVI and CTI ablation by Dr [name]  . EP IMPLANTABLE DEVICE N/A 08/24/2015   Procedure: Loop Recorder Insertion;  Surgeon: Thompson Grayer, MD;  Location: [place] CV LAB;  Service: Cardiovascular;  Laterality: N/A;  . HYSTEROSCOPY W/D&C N/A 12/29/2014   Procedure: DILATATION AND CURETTAGE /HYSTEROSCOPY;  Surgeon: [name], MD;  [facility];  Service: Gynecology;  Laterality: N/A;  . TEE WITHOUT CARDIOVERSION N/A 02/24/2015   Procedure: TRANSESOPHAGEAL ECHOCARDIOGRAM (TEE);  Surgeon: [name], MD;  Location: [place];  Service: Cardiovascular;  Laterality: N/A;  . [place]- all systems are reviewed and negatives except as per HPI above  Current Outpatient Prescriptions  Medication Sig Dispense Refill  . apixaban (ELIQUIS) 5 MG TABS tablet Take 1 tablet (5 mg total) by mouth 2 (two) times daily. 180 tablet 3  . brimonidine (ALPHAGAN) 0.2 % ophthalmic solution Place 1 drop into the left eye 2 (two) times daily. Reported on 03/06/2016    . Calcium Carbonate-Vit D-Min (CALTRATE 600+D PLUS PO) Take 1 tablet by mouth daily.    . Cholecalciferol (VITAMIN D PO) Take 2,000 Units by mouth daily.     . Coenzyme Q10 (COQ-10) 100 MG CAPS Take 1 tablet by mouth daily.    . Cranberry 500 MG CAPS Take 500 mg by mouth daily.     Marland Kitchen diltiazem (CARDIZEM) 30 MG tablet Cardizem 30mg  -- take 1 tablet by mouth every 4 hours AS NEEDED for heart rate >100 as long as blood pressure >100.    . diltiazem (CARTIA XT) 240 MG 24 hr capsule Take 1 capsule (240 mg total) by mouth daily. 90 capsule 2  .  furosemide (LASIX) 20 MG tablet Take 1 tablet (20 mg total) by mouth daily as needed. Take daily as needed for swelling 90 tablet 3  . albuterol (PROVENTIL HFA;VENTOLIN HFA) 108 (90 BASE) MCG/ACT inhaler Inhale into the lungs every 6 (six) hours as needed for wheezing or shortness of breath. Reported on 11/28/2015    . letrozole (FEMARA) 2.5 MG tablet TAKE ONE TABLET BY MOUTH ONCE DAILY 90 tablet 1  . loratadine (CLARITIN) 10 MG tablet Take 10 mg by mouth daily as needed for allergies. Reported on 09/20/2015    . LUMIGAN 0.01 % SOLN Place 1 drop into both eyes at bedtime.    . Magnesium 250 MG TABS Take 1 tablet (250 mg total) by mouth daily.  0  . Multiple Vitamin (MULTIVITAMIN) tablet Take 1 tablet by mouth daily.      . Omega-3 Fatty Acids (FISH OIL) 1200 MG CAPS Take 1 capsule by mouth daily.    Marland Kitchen SOTALOL AF 80 MG TABS Take 1 tablet (80 mg total) by mouth 2 (two) times daily. 180 tablet 2   No current facility-administered medications for this visit.     Physical Exam: Vitals:   12/03/16 1554  BP: 120/68  Pulse: 61  SpO2: 97%  Weight: 201 lb 9.6 oz (91.4 kg)  Height: 5\' 3"  (1.6 m)    GEN- The patient is well appearing, alert and oriented x 3 today.   Head- normocephalic, atraumatic Eyes-  Sclera clear, conjunctiva pink Ears- hearing intact Oropharynx- clear Lungs- Clear to ausculation bilaterally, normal work of breathing Heart- Regular rate and rhythm, no murmurs, rubs or gallops, PMI not laterally displaced GI- soft, NT, ND, + BS Extremities- no clubbing, cyanosis, or edema Neuro- strength/ sensation intact  ekg today reveals sinus rhythm 61 bpm, Qtc 426  Assessment and Plan:  1. AFib Ongoing afib/ atypical atrial flutter post ablation (16% burden by ILR--> reviewed today).  She is s/p PVI and CTI ablation 02/2015.   Therapeutic strategies for afib including medicine and ablation were discussed in detail with the patient today. Risk, benefits, and alternatives to EP study and radiofrequency ablation for afib were also discussed in detail today. These risks include but are not limited to stroke, bleeding, vascular damage, tamponade, perforation, damage to the esophagus, lungs, and other structures, pulmonary vein stenosis, worsening renal function, and death. The patient understands these risk and wishes to proceed.  We will therefore proceed with catheter ablation at the next available time.  Will obtain cardiac CT prior to ablation to exclude LA thrombus.  2. Sick sinus  ILR is reviewed today and shows post termination pauses.  She has dizziness but no frank syncope.  I think that the best option is ablation to eliminate afib and also the post termination pauses.  If she has worsening  pauses she may require pacing though she is clear that she is not interested in PPM implant at this time.   Thompson Grayer MD, [facility] 12/03/2016 4:33 PM

## 2016-12-03 NOTE — Telephone Encounter (Signed)
Spoke with patient regarding 5sec pause episode noted on LINQ from 12/02/16 at 0947.  Patient reports she was asymptomatic with this episode.  She is taking all medications, including Eliquis and Cartia XT, as prescribed.  Patient reports that she took diltiazem 30mg  around 0300 on 3/18 as she was experiencing palpitations all weekend.  Patient also notes that her B/P has been low sometimes, which prohibits her from taking her PRN diltiazem.  This AM her B/P was in the 90s/50s and she feels well.  Patient is due for f/u with Dr. [name] and is agreeable to appointment this afternoon at 4:00pm.  She is appreciative of call and denies additional questions or concerns at this time.

## 2016-12-06 LAB — CUP PACEART REMOTE DEVICE CHECK
Date Time Interrogation Session: 20180303044312
Implantable Pulse Generator Implant Date: 20161207

## 2016-12-07 ENCOUNTER — Encounter: Payer: Self-pay | Admitting: *Deleted

## 2016-12-10 ENCOUNTER — Other Ambulatory Visit (HOSPITAL_COMMUNITY): Payer: Self-pay | Admitting: Nurse Practitioner

## 2016-12-10 ENCOUNTER — Telehealth: Payer: Self-pay | Admitting: Internal Medicine

## 2016-12-10 NOTE — Telephone Encounter (Signed)
Spoke with Heidi Lopez to schedule the cardiac ct.  She stats that her heart is doing good . Want to known if she still need to have the ablation and Cardiac ct done.

## 2016-12-11 NOTE — Telephone Encounter (Signed)
She says afib only bothered her on Sat And Sun prior to her appointment with JA and nothing since.  Questioning if she should do ablation at this point or just follow up in several months?

## 2016-12-13 NOTE — Telephone Encounter (Signed)
Spoke with patient and she wants to wait until end of May.  She says she does not want to go forward with ablation at this time.  She has had one before and says she really could not tell the difference and it was painful.  Says "she is fine" right now and will call us back if she changes her mind or has problems.

## 2016-12-17 ENCOUNTER — Ambulatory Visit (INDEPENDENT_AMBULATORY_CARE_PROVIDER_SITE_OTHER): Payer: Medicare Other | Admitting: *Deleted

## 2016-12-17 DIAGNOSIS — I480 Paroxysmal atrial fibrillation: Secondary | ICD-10-CM | POA: Diagnosis not present

## 2016-12-17 NOTE — Progress Notes (Signed)
Carelink Summary Report / Loop Recorder 

## 2016-12-20 LAB — CUP PACEART REMOTE DEVICE CHECK
Date Time Interrogation Session: 20180402044312
Implantable Pulse Generator Implant Date: 20161207

## 2016-12-27 ENCOUNTER — Other Ambulatory Visit: Payer: Medicare Other

## 2017-01-03 ENCOUNTER — Encounter (HOSPITAL_COMMUNITY): Admission: RE | Payer: Self-pay | Source: Ambulatory Visit

## 2017-01-03 ENCOUNTER — Ambulatory Visit (HOSPITAL_COMMUNITY): Admission: RE | Admit: 2017-01-03 | Payer: Medicare Other | Source: Ambulatory Visit | Admitting: Internal Medicine

## 2017-01-03 SURGERY — ATRIAL FIBRILLATION ABLATION
Anesthesia: Monitor Anesthesia Care

## 2017-01-16 ENCOUNTER — Telehealth: Payer: Self-pay | Admitting: Cardiology

## 2017-01-16 ENCOUNTER — Ambulatory Visit (INDEPENDENT_AMBULATORY_CARE_PROVIDER_SITE_OTHER): Payer: Medicare Other | Admitting: *Deleted

## 2017-01-16 DIAGNOSIS — I480 Paroxysmal atrial fibrillation: Secondary | ICD-10-CM | POA: Diagnosis not present

## 2017-01-16 NOTE — Progress Notes (Signed)
Carelink Summary Report / Loop Recorder 

## 2017-01-16 NOTE — Telephone Encounter (Signed)
Spoke w/ pt and requested that she send a manual transmission b/c her home monitor has not updated in at least 14 days.   

## 2017-01-28 LAB — CUP PACEART REMOTE DEVICE CHECK
Date Time Interrogation Session: 20180502054148
MDC IDC PG IMPLANT DT: 20161207

## 2017-01-29 ENCOUNTER — Ambulatory Visit (HOSPITAL_COMMUNITY): Payer: Medicare Other | Admitting: Nurse Practitioner

## 2017-02-12 ENCOUNTER — Telehealth: Payer: Self-pay | Admitting: *Deleted

## 2017-02-12 NOTE — Telephone Encounter (Signed)
LMOM requesting call back to the [facility].  Patient had a pause episode on 02/09/17 at 0900, duration ~6sec.  Will determine if patient symptomatic with episode.

## 2017-02-12 NOTE — Telephone Encounter (Signed)
LMOM requesting call back to [facility]. 

## 2017-02-12 NOTE — Telephone Encounter (Signed)
Pt returning your call. Patient may be gone around 3:30PM.

## 2017-02-13 ENCOUNTER — Telehealth: Payer: Self-pay

## 2017-02-13 NOTE — Telephone Encounter (Signed)
Spoke with patient who expressed frustration that she had been "waiting around" for someone to return her call stating that she was concerned and wanted to know what was going on. I explained that her remote transmission showed a pause on Saturday and we were calling to see if she had any symptoms. She states she felt dizzy at the correlated time of the pause episode. I explained that I would notify Raquel Sarna of our conversation. Patient gave verbal permission to leave detailed message on VM. Phone number confirmed.

## 2017-02-13 NOTE — Telephone Encounter (Signed)
Left detailed message per patient request advising her that at this point he does not have any additional recommended changes at this time.

## 2017-02-14 ENCOUNTER — Other Ambulatory Visit: Payer: Self-pay | Admitting: Internal Medicine

## 2017-02-14 NOTE — Telephone Encounter (Signed)
See other phone note from 02/14/17 for Dr. Jackalyn Lombard recommendations.

## 2017-02-15 ENCOUNTER — Ambulatory Visit (INDEPENDENT_AMBULATORY_CARE_PROVIDER_SITE_OTHER): Payer: Medicare Other | Admitting: *Deleted

## 2017-02-15 DIAGNOSIS — I480 Paroxysmal atrial fibrillation: Secondary | ICD-10-CM | POA: Diagnosis not present

## 2017-02-15 NOTE — Progress Notes (Signed)
Carelink Summary Report / Loop Recorder 

## 2017-02-18 ENCOUNTER — Encounter (HOSPITAL_COMMUNITY): Payer: Self-pay | Admitting: Nurse Practitioner

## 2017-02-18 ENCOUNTER — Ambulatory Visit (HOSPITAL_COMMUNITY)
Admission: RE | Admit: 2017-02-18 | Discharge: 2017-02-18 | Disposition: A | Discharge status: Home / Self Care | Payer: Medicare Other | Source: Ambulatory Visit | Attending: Nurse Practitioner | Admitting: Nurse Practitioner

## 2017-02-18 VITALS — BP 142/84 | HR 110 | Ht 63.0 in | Wt 199.2 lb

## 2017-02-18 DIAGNOSIS — I4892 Unspecified atrial flutter: Secondary | ICD-10-CM | POA: Diagnosis not present

## 2017-02-18 DIAGNOSIS — H409 Unspecified glaucoma: Secondary | ICD-10-CM | POA: Diagnosis not present

## 2017-02-18 DIAGNOSIS — Z9851 Tubal ligation status: Secondary | ICD-10-CM | POA: Insufficient documentation

## 2017-02-18 DIAGNOSIS — Z809 Family history of malignant neoplasm, unspecified: Secondary | ICD-10-CM | POA: Diagnosis not present

## 2017-02-18 DIAGNOSIS — Z853 Personal history of malignant neoplasm of breast: Secondary | ICD-10-CM | POA: Diagnosis not present

## 2017-02-18 DIAGNOSIS — R61 Generalized hyperhidrosis: Secondary | ICD-10-CM | POA: Insufficient documentation

## 2017-02-18 DIAGNOSIS — Z823 Family history of stroke: Secondary | ICD-10-CM | POA: Insufficient documentation

## 2017-02-18 DIAGNOSIS — Z833 Family history of diabetes mellitus: Secondary | ICD-10-CM | POA: Insufficient documentation

## 2017-02-18 DIAGNOSIS — Z9889 Other specified postprocedural states: Secondary | ICD-10-CM | POA: Diagnosis not present

## 2017-02-18 DIAGNOSIS — Z7902 Long term (current) use of antithrombotics/antiplatelets: Secondary | ICD-10-CM | POA: Diagnosis not present

## 2017-02-18 DIAGNOSIS — Z87891 Personal history of nicotine dependence: Secondary | ICD-10-CM | POA: Insufficient documentation

## 2017-02-18 DIAGNOSIS — Z8249 Family history of ischemic heart disease and other diseases of the circulatory system: Secondary | ICD-10-CM | POA: Diagnosis not present

## 2017-02-18 DIAGNOSIS — Z888 Allergy status to other drugs, medicaments and biological substances status: Secondary | ICD-10-CM | POA: Diagnosis not present

## 2017-02-18 DIAGNOSIS — M8580 Other specified disorders of bone density and structure, unspecified site: Secondary | ICD-10-CM | POA: Insufficient documentation

## 2017-02-18 DIAGNOSIS — I959 Hypotension, unspecified: Secondary | ICD-10-CM | POA: Insufficient documentation

## 2017-02-18 DIAGNOSIS — I480 Paroxysmal atrial fibrillation: Secondary | ICD-10-CM | POA: Insufficient documentation

## 2017-02-18 LAB — CUP PACEART REMOTE DEVICE CHECK
Implantable Pulse Generator Implant Date: 20161207
MDC IDC SESS DTM: 20180601060733

## 2017-02-18 LAB — BASIC METABOLIC PANEL
Anion gap: 9 (ref 5–15)
BUN: 10 mg/dL (ref 6–20)
CHLORIDE: 106 mmol/L (ref 101–111)
CO2: 24 mmol/L (ref 22–32)
CREATININE: 0.91 mg/dL (ref 0.44–1.00)
Calcium: 10 mg/dL (ref 8.9–10.3)
GFR calc non Af Amer: >60 mL/min (ref >60)
GLUCOSE: 89 mg/dL (ref 65–99)
Potassium: 4.3 mmol/L (ref 3.5–5.1)
Sodium: 139 mmol/L (ref 135–145)

## 2017-02-18 LAB — MAGNESIUM: Magnesium: 1.9 mg/dL (ref 1.7–2.4)

## 2017-02-18 MED ORDER — DILTIAZEM HCL ER COATED BEADS 180 MG PO CP24: 180.0000 mg | ORAL_CAPSULE | Freq: Every day | ORAL | 3 refills | Status: DC

## 2017-02-18 NOTE — Patient Instructions (Signed)
Your physician has recommended you make the following change in your medication:  1)Decrease cardizem to 180mg  once a day

## 2017-02-19 NOTE — Progress Notes (Signed)
Patient ID: Heidi Lopez, female   DOB: 20-Sep-1945, 71 y.o.   MRN: [id number]    Primary Care Physician: [name], MD Referring Physician: Dr. Melburn Hake Heidi Lopez is a 71 y.o. female with a h/o PAF and s/p ablation. She is on sotalol now for afib/atypical a flutter with RVR. She had increase in afib burden several months ago and saw Dr. [name] to be considered for repeat ablation. She was also noted to have post termination pauses. However, after she agreed to repeat ablation, her perceived afib burden decreased. She had one termination pause last Saturday and was symptomatic for a few minutes but this is the first one she has had in some time. Right now, she is ok with her afib burden but her BP's are running low at times in the 84-69'G systolic at which time she feels lightheaded.She is in afib today but was unaware.  Today, she denies symptoms of palpitations, chest pain, shortness of breath, orthopnea, PND, lower extremity edema, dizziness, presyncope, syncope, or neurologic sequela. The patient is tolerating medications without difficulties and is otherwise without complaint today.   Past Medical History:  Diagnosis Date  . Breast cancer ([place])   . Cancer of breast [facility]) July 2010   s/p XRT 2010 and again in 2011  . Chronic coughing   . Cramps, muscle, general    severe breast cramping   . Dysrhythmia    atrial flutter  . Glaucoma   . Glaucoma    left eye   . Joint pain    foot and knee pain  . Lymphedema of arm    left  . Night sweats   . Osteopenia 01/24/2015  . Pain    breast pain  . Paroxysmal atrial fibrillation ([place]) 05/2014  . Sinus problem   . Wears glasses    Past Surgical History:  Procedure Laterality Date  . BREAST SURGERY  2010- right and 2011-left   for cancer  . CARDIOVERSION N/A 10/21/2014   Procedure: CARDIOVERSION;  Surgeon: [name], MD;  Location: [place];  Service: Cardiovascular;  Laterality: N/A;  . COLONOSCOPY N/A 02/05/2013   Procedure: COLONOSCOPY;  Surgeon: [name], MD;  [facility];  Service: Endoscopy;  Laterality: N/A;  830  . ELECTROPHYSIOLOGIC STUDY N/A 02/24/2015   PVI and CTI ablation by Dr [name]  . EP IMPLANTABLE DEVICE N/A 08/24/2015   Procedure: Loop Recorder Insertion;  Surgeon: Thompson Grayer, MD;  Location: [place] CV LAB;  Service: Cardiovascular;  Laterality: N/A;  . HYSTEROSCOPY W/D&C N/A 12/29/2014   Procedure: DILATATION AND CURETTAGE /HYSTEROSCOPY;  Surgeon: [name], MD;  [facility];  Service: Gynecology;  Laterality: N/A;  . TEE WITHOUT CARDIOVERSION N/A 02/24/2015   Procedure: TRANSESOPHAGEAL ECHOCARDIOGRAM (TEE);  Surgeon: [name], MD;  Location: [facility] ENDOSCOPY;  Service: Cardiovascular;  Laterality: N/A;  . TUBAL LIGATION  1984    Current Outpatient Prescriptions  Medication Sig Dispense Refill  . albuterol (PROVENTIL HFA;VENTOLIN HFA) 108 (90 BASE) MCG/ACT inhaler Inhale into the lungs every 6 (six) hours as needed for wheezing or shortness of breath. Reported on 11/28/2015    . brimonidine (ALPHAGAN) 0.2 % ophthalmic solution Place 1 drop into the left eye 2 (two) times daily. Reported on 03/06/2016    . Calcium Carbonate-Vit D-Min (CALTRATE 600+D PLUS PO) Take 1 tablet by mouth daily.    . Cholecalciferol (VITAMIN D PO) Take 2,000 Units by mouth daily.     Marland Kitchen  Coenzyme Q10 (COQ-10) 100 MG CAPS Take 1 tablet by mouth daily.    . Cranberry 500 MG CAPS Take 500 mg by mouth daily.     Marland Kitchen diltiazem (CARDIZEM CD) 180 MG 24 hr capsule Take 1 capsule (180 mg total) by mouth daily. 30 capsule 3  . diltiazem (CARDIZEM) 30 MG tablet Cardizem 30mg  -- take 1 tablet by mouth every 4 hours AS NEEDED for heart rate >100 as long as blood pressure >100.    Marland Kitchen ELIQUIS 5 MG TABS tablet TAKE ONE TABLET BY MOUTH TWICE DAILY 180 tablet 3  . furosemide (LASIX) 20 MG tablet Take 1 tablet (20 mg total) by mouth daily as needed. Take daily as needed for swelling 90 tablet 3  . letrozole  (FEMARA) 2.5 MG tablet TAKE ONE TABLET BY MOUTH ONCE DAILY 90 tablet 1  . loratadine (CLARITIN) 10 MG tablet Take 10 mg by mouth daily as needed for allergies. Reported on 09/20/2015    . LUMIGAN 0.01 % SOLN Place 1 drop into both eyes at bedtime.    . Magnesium 250 MG TABS Take 1 tablet (250 mg total) by mouth daily.  0  . Multiple Vitamin (MULTIVITAMIN) tablet Take 1 tablet by mouth daily.     . Omega-3 Fatty Acids (FISH OIL) 1200 MG CAPS Take 1 capsule by mouth daily.    Marland Kitchen SOTALOL AF 80 MG TABS Take 1 tablet (80 mg total) by mouth 2 (two) times daily. 180 tablet 2   No current facility-administered medications for this encounter.     Allergies  Allergen Reactions  . Metoprolol     Makes her feel bad  . Reglan [Metoclopramide] Other (See Comments)    Causes severe jitters    Social History   Social History  . Marital status: Widowed    Spouse name: N/A  . Number of children: N/A  . Years of education: N/A   Occupational History  . Not on file.   Social History Main Topics  . Smoking status: Former Smoker    Packs/day: 1.00    Years: 2.00    Types: Cigarettes    Start date: 09/17/1962    Quit date: 11/16/1966  . Smokeless tobacco: Never Used  . Alcohol use No  . Drug use: No  . Sexual activity: Yes    Birth control/ protection: Post-menopausal   Other Topics Concern  . Not on file   Social History Narrative   Lives alone in [place]   Retired   Worked previously for [employer] and [employer]    Family History  Problem Relation Age of Onset  . Heart disease Mother   . Stroke Father   . Cancer Father   . Early death Brother   . Cancer Sister   . Cancer Sister   . Cancer Sister   . Diabetes Daughter     ROS- All systems are reviewed and negative except as per the HPI above  Physical Exam: Vitals:   02/18/17 1531  BP: (!) 142/84  Pulse: (!) 110  Weight: 199 lb 3.2 oz (90.4 kg)  Height: 5\' 3"  (1.6 m)    GEN- The patient is well appearing, alert and  oriented x 3 today.   Head- normocephalic, atraumatic Eyes-  Sclera clear, conjunctiva pink Ears- hearing intact Oropharynx- clear Neck- supple, no JVP Lymph- no cervical lymphadenopathy Lungs- Clear to ausculation bilaterally, normal work of breathing Heart- Regular rate and rhythm, no murmurs, rubs or gallops, PMI not laterally displaced GI-  soft, NT, ND, + BS Extremities- no clubbing, cyanosis, or edema MS- no significant deformity or atrophy Skin- no rash or lesion Psych- euthymic mood, full affect Neuro- strength and sensation are intact  EKG-afib  at 110 bpm, pr int 156 ms, qrs int 90 ms, qtc 489 ms (stable) Epic records reviewed  Assessment and Plan: 1. Afib/flutter Less perceived  afib/flutter  episodes on sotalol , she is happy with current afib burden Continue sotalol 80 mg bid  Continue apixaban for chadsvasc score of at least 2 Bmet/mag  2. Intermittent hypotension Try decreasing cardizem to 180 mg a day to see there are less low's with this dose Watch to make sure that afib burden does not increase with reducing dose  3. Post termination pauses Watch for increase in symptoms Continue monitoring with the LInQ  F/u in 3 months with Dr. Lawrence Marseilles C. Heidi Lopez, [facility] [address] [place], [place] [address] [phone]

## 2017-03-13 DIAGNOSIS — H40001 Preglaucoma, unspecified, right eye: Secondary | ICD-10-CM | POA: Diagnosis not present

## 2017-03-13 DIAGNOSIS — H401121 Primary open-angle glaucoma, left eye, mild stage: Secondary | ICD-10-CM | POA: Diagnosis not present

## 2017-03-18 ENCOUNTER — Ambulatory Visit (INDEPENDENT_AMBULATORY_CARE_PROVIDER_SITE_OTHER): Payer: Medicare Other | Admitting: *Deleted

## 2017-03-18 DIAGNOSIS — I480 Paroxysmal atrial fibrillation: Secondary | ICD-10-CM

## 2017-03-18 NOTE — Progress Notes (Signed)
Carelink Summary Report / Loop Recorder 

## 2017-04-03 LAB — CUP PACEART REMOTE DEVICE CHECK
Date Time Interrogation Session: 20180701074533
MDC IDC PG IMPLANT DT: 20161207

## 2017-04-16 ENCOUNTER — Ambulatory Visit (INDEPENDENT_AMBULATORY_CARE_PROVIDER_SITE_OTHER): Payer: Medicare Other | Admitting: *Deleted

## 2017-04-16 DIAGNOSIS — I480 Paroxysmal atrial fibrillation: Secondary | ICD-10-CM | POA: Diagnosis not present

## 2017-04-16 NOTE — Progress Notes (Signed)
Carelink Summary Report / Loop Recorder 

## 2017-04-29 LAB — CUP PACEART REMOTE DEVICE CHECK
Implantable Pulse Generator Implant Date: 20161207
MDC IDC SESS DTM: 20180731073958

## 2017-05-07 ENCOUNTER — Encounter: Payer: Self-pay | Admitting: Obstetrics & Gynecology

## 2017-05-07 ENCOUNTER — Ambulatory Visit (INDEPENDENT_AMBULATORY_CARE_PROVIDER_SITE_OTHER): Payer: Medicare Other | Admitting: Obstetrics & Gynecology

## 2017-05-07 ENCOUNTER — Other Ambulatory Visit (HOSPITAL_COMMUNITY)
Admission: RE | Admit: 2017-05-07 | Discharge: 2017-05-07 | Disposition: A | Discharge status: Home / Self Care | Payer: Medicare Other | Source: Ambulatory Visit | Attending: Obstetrics & Gynecology | Admitting: Obstetrics & Gynecology

## 2017-05-07 VITALS — BP 128/80 | HR 63 | Ht 63.0 in | Wt 196.0 lb

## 2017-05-07 DIAGNOSIS — Z124 Encounter for screening for malignant neoplasm of cervix: Secondary | ICD-10-CM | POA: Diagnosis not present

## 2017-05-07 DIAGNOSIS — Z853 Personal history of malignant neoplasm of breast: Secondary | ICD-10-CM

## 2017-05-07 DIAGNOSIS — Z01419 Encounter for gynecological examination (general) (routine) without abnormal findings: Secondary | ICD-10-CM

## 2017-05-07 MED ORDER — SOLIFENACIN SUCCINATE 10 MG PO TABS: 10.0000 mg | ORAL_TABLET | Freq: Every day | ORAL | 11 refills | Status: DC

## 2017-05-07 NOTE — Progress Notes (Signed)
Subjective:     Heidi Lopez is a 71 y.o. female here for a routine exam.  No LMP recorded. Patient is postmenopausal. No obstetric history on file. Birth Control Method:  Tubal ligation Menstrual Calendar(currently): amenorrheic  Current complaints: urinary frequency.   Current acute medical issues:  A fib   Recent Gynecologic History No LMP recorded. Patient is postmenopausal. Last Pap: 11/2015,  normal Last mammogram: 2018,  normal  Past Medical History:  Diagnosis Date  . Breast cancer ([place])   . Cancer of breast [facility]) July 2010   s/p XRT 2010 and again in 2011  . Chronic coughing   . Cramps, muscle, general    severe breast cramping   . Dysrhythmia    atrial flutter  . Glaucoma   . Glaucoma    left eye   . Joint pain    foot and knee pain  . Lymphedema of arm    left  . Night sweats   . Osteopenia 01/24/2015  . Pain    breast pain  . Paroxysmal atrial fibrillation ([place]) 05/2014  . Sinus problem   . Wears glasses     Past Surgical History:  Procedure Laterality Date  . BREAST SURGERY  2010- right and 2011-left   for cancer  . CARDIOVERSION N/A 10/21/2014   Procedure: CARDIOVERSION;  Surgeon: [name], MD;  Location: [place];  Service: Cardiovascular;  Laterality: N/A;  . COLONOSCOPY N/A 02/05/2013   Procedure: COLONOSCOPY;  Surgeon: [name], MD;  [facility];  Service: Endoscopy;  Laterality: N/A;  830  . ELECTROPHYSIOLOGIC STUDY N/A 02/24/2015   PVI and CTI ablation by Dr [name]  . EP IMPLANTABLE DEVICE N/A 08/24/2015   Procedure: Loop Recorder Insertion;  Surgeon: Thompson Grayer, MD;  Location: [place] CV LAB;  Service: Cardiovascular;  Laterality: N/A;  . HYSTEROSCOPY W/D&C N/A 12/29/2014   Procedure: DILATATION AND CURETTAGE /HYSTEROSCOPY;  Surgeon: [name], MD;  [facility];  Service: Gynecology;  Laterality: N/A;  . TEE WITHOUT CARDIOVERSION N/A 02/24/2015   Procedure: TRANSESOPHAGEAL ECHOCARDIOGRAM (TEE);  Surgeon:  [name], MD;  Location: [facility] ENDOSCOPY;  Service: Cardiovascular;  Laterality: N/A;  . TUBAL LIGATION  1984    OB History    No data available      Social History   Social History  . Marital status: Widowed    Spouse name: N/A  . Number of children: N/A  . Years of education: N/A   Social History Main Topics  . Smoking status: Former Smoker    Packs/day: 1.00    Years: 2.00    Types: Cigarettes    Start date: 09/17/1962    Quit date: 11/16/1966  . Smokeless tobacco: Never Used  . Alcohol use No  . Drug use: No  . Sexual activity: Not Currently    Birth control/ protection: Post-menopausal   Other Topics Concern  . None   Social History Narrative   Lives alone in [place]   Retired   Worked previously for [employer] and [employer]    Family History  Problem Relation Age of Onset  . Heart disease Mother   . Stroke Father   . Cancer Father   . Early death Brother   . Cancer Sister   . Cancer Sister   . Cancer Sister   . Diabetes Daughter      Current Outpatient Prescriptions:  .  brimonidine (ALPHAGAN) 0.2 % ophthalmic solution, Place 1 drop into the  left eye 2 (two) times daily. Reported on 03/06/2016, Disp: , Rfl:  .  Calcium Carbonate-Vit D-Min (CALTRATE 600+D PLUS PO), Take 1 tablet by mouth daily., Disp: , Rfl:  .  Cholecalciferol (VITAMIN D PO), Take 2,000 Units by mouth daily. , Disp: , Rfl:  .  Coenzyme Q10 (COQ-10) 100 MG CAPS, Take 1 tablet by mouth daily., Disp: , Rfl:  .  Cranberry 500 MG CAPS, Take 500 mg by mouth daily. , Disp: , Rfl:  .  diltiazem (CARDIZEM CD) 180 MG 24 hr capsule, Take 1 capsule (180 mg total) by mouth daily., Disp: 30 capsule, Rfl: 3 .  diltiazem (CARDIZEM) 30 MG tablet, Cardizem 30mg  -- take 1 tablet by mouth every 4 hours AS NEEDED for heart rate >100 as long as blood pressure >100., Disp: , Rfl:  .  ELIQUIS 5 MG TABS tablet, TAKE ONE TABLET BY MOUTH TWICE DAILY, Disp: 180 tablet, Rfl: 3 .  letrozole (FEMARA) 2.5 MG tablet,  TAKE ONE TABLET BY MOUTH ONCE DAILY, Disp: 90 tablet, Rfl: 1 .  loratadine (CLARITIN) 10 MG tablet, Take 10 mg by mouth daily as needed for allergies. Reported on 09/20/2015, Disp: , Rfl:  .  LUMIGAN 0.01 % SOLN, Place 1 drop into both eyes at bedtime., Disp: , Rfl:  .  Magnesium 250 MG TABS, Take 1 tablet (250 mg total) by mouth daily., Disp: , Rfl: 0 .  Multiple Vitamin (MULTIVITAMIN) tablet, Take 1 tablet by mouth daily. , Disp: , Rfl:  .  Omega-3 Fatty Acids (FISH OIL) 1200 MG CAPS, Take 1 capsule by mouth daily., Disp: , Rfl:  .  SOTALOL AF 80 MG TABS, Take 1 tablet (80 mg total) by mouth 2 (two) times daily., Disp: 180 tablet, Rfl: 2 .  albuterol (PROVENTIL HFA;VENTOLIN HFA) 108 (90 BASE) MCG/ACT inhaler, Inhale into the lungs every 6 (six) hours as needed for wheezing or shortness of breath. Reported on 11/28/2015, Disp: , Rfl:  .  furosemide (LASIX) 20 MG tablet, Take 1 tablet (20 mg total) by mouth daily as needed. Take daily as needed for swelling (Patient not taking: Reported on 05/07/2017), Disp: 90 tablet, Rfl: 3 .  solifenacin (VESICARE) 10 MG tablet, Take 1 tablet (10 mg total) by mouth daily., Disp: 30 tablet, Rfl: 11  Review of Systems  Review of Systems  Constitutional: Negative for fever, chills, weight loss, malaise/fatigue and diaphoresis.  HENT: Negative for hearing loss, ear pain, nosebleeds, congestion, sore throat, neck pain, tinnitus and ear discharge.   Eyes: Negative for blurred vision, double vision, photophobia, pain, discharge and redness.  Respiratory: Negative for cough, hemoptysis, sputum production, shortness of breath, wheezing and stridor.   Cardiovascular: Negative for chest pain, palpitations, orthopnea, claudication, leg swelling and PND.  Gastrointestinal: negative for abdominal pain. Negative for heartburn, nausea, vomiting, diarrhea, constipation, blood in stool and melena.  Genitourinary: Negative for dysuria, urgency, frequency, hematuria and flank pain.   Musculoskeletal: Negative for myalgias, back pain, joint pain and falls.  Skin: Negative for itching and rash.  Neurological: Negative for dizziness, tingling, tremors, sensory change, speech change, focal weakness, seizures, loss of consciousness, weakness and headaches.  Endo/Heme/Allergies: Negative for environmental allergies and polydipsia. Does not bruise/bleed easily.  Psychiatric/Behavioral: Negative for depression, suicidal ideas, hallucinations, memory loss and substance abuse. The patient is not nervous/anxious and does not have insomnia.        Objective:  Blood pressure 128/80, pulse 63, height 5\' 3"  (1.6 m), weight 196 lb (88.9 kg).  Physical Exam  Vitals reviewed. Constitutional: She is oriented to person, place, and time. She appears well-developed and well-nourished.  HENT:  Head: Normocephalic and atraumatic.        Right Ear: External ear normal.  Left Ear: External ear normal.  Nose: Nose normal.  Mouth/Throat: Oropharynx is clear and moist.  Eyes: Conjunctivae and EOM are normal. Pupils are equal, round, and reactive to light. Right eye exhibits no discharge. Left eye exhibits no discharge. No scleral icterus.  Neck: Normal range of motion. Neck supple. No tracheal deviation present. No thyromegaly present.  Cardiovascular: Normal rate, regular rhythm, normal heart sounds and intact distal pulses.  Exam reveals no gallop and no friction rub.   No murmur heard. Respiratory: Effort normal and breath sounds normal. No respiratory distress. She has no wheezes. She has no rales. She exhibits no tenderness.  GI: Soft. Bowel sounds are normal. She exhibits no distension and no mass. There is no tenderness. There is no rebound and no guarding.  Genitourinary:  Breasts no masses skin changes or nipple changes bilaterally      Vulva is normal without lesions Vagina is pink moist without discharge Cervix normal in appearance and pap is done Uterus is normal size shape and  contour Adnexa is negative with normal sized ovaries   Musculoskeletal: Normal range of motion. She exhibits no edema and no tenderness.  Neurological: She is alert and oriented to person, place, and time. She has normal reflexes. She displays normal reflexes. No cranial nerve deficit. She exhibits normal muscle tone. Coordination normal.  Skin: Skin is warm and dry. No rash noted. No erythema. No pallor.  Psychiatric: She has a normal mood and affect. Her behavior is normal. Judgment and thought content normal.       Medications Ordered at today's visit: Meds ordered this encounter  Medications  . solifenacin (VESICARE) 10 MG tablet    Sig: Take 1 tablet (10 mg total) by mouth daily.    Dispense:  30 tablet    Refill:  11    Other orders placed at today's visit: No orders of the defined types were placed in this encounter.     Assessment:    Healthy female exam.   Urinary urgency Plan:    Mammogram ordered. Follow up in: 1 month. vesicare 10 mg qhs     Return in about 1 month (around 06/07/2017) for Follow up, with Dr Elonda Husky.

## 2017-05-09 LAB — CYTOLOGY - PAP
Diagnosis: NEGATIVE
HPV: NOT DETECTED

## 2017-05-10 ENCOUNTER — Other Ambulatory Visit (HOSPITAL_COMMUNITY): Payer: Self-pay | Admitting: Oncology

## 2017-05-10 DIAGNOSIS — Z853 Personal history of malignant neoplasm of breast: Secondary | ICD-10-CM

## 2017-05-10 NOTE — Telephone Encounter (Signed)
Patient called for refill on letrozole. Reviewed with provider, chart checked and refilled.

## 2017-05-16 ENCOUNTER — Ambulatory Visit (INDEPENDENT_AMBULATORY_CARE_PROVIDER_SITE_OTHER): Payer: Medicare Other | Admitting: *Deleted

## 2017-05-16 DIAGNOSIS — I480 Paroxysmal atrial fibrillation: Secondary | ICD-10-CM | POA: Diagnosis not present

## 2017-05-16 NOTE — Progress Notes (Signed)
Carelink Summary Report / Loop Recorder 

## 2017-05-17 LAB — CUP PACEART REMOTE DEVICE CHECK
Date Time Interrogation Session: 20180830103845
MDC IDC PG IMPLANT DT: 20161207

## 2017-05-17 NOTE — Progress Notes (Signed)
Carelink summary report received. Battery status OK. Normal device function. No new symptom episodes, tachy episodes, brady, or pause episodes. 68 AF 25.8% +Eliquis. Monthly summary reports and ROV/PRN

## 2017-05-24 ENCOUNTER — Other Ambulatory Visit: Payer: Self-pay | Admitting: Obstetrics & Gynecology

## 2017-05-24 DIAGNOSIS — Z853 Personal history of malignant neoplasm of breast: Secondary | ICD-10-CM

## 2017-05-27 ENCOUNTER — Ambulatory Visit (HOSPITAL_COMMUNITY): Payer: Medicare Other

## 2017-05-29 ENCOUNTER — Other Ambulatory Visit (HOSPITAL_COMMUNITY): Payer: Self-pay | Admitting: Nurse Practitioner

## 2017-05-30 ENCOUNTER — Other Ambulatory Visit (HOSPITAL_COMMUNITY): Payer: Self-pay | Admitting: *Deleted

## 2017-05-30 MED ORDER — SOTALOL HCL (AF) 80 MG PO TABS: 1.0000 | ORAL_TABLET | Freq: Two times a day (BID) | ORAL | 2 refills | Status: DC

## 2017-06-04 ENCOUNTER — Ambulatory Visit
Admission: RE | Admit: 2017-06-04 | Discharge: 2017-06-04 | Disposition: A | Discharge status: Home / Self Care | Payer: Medicare Other | Source: Ambulatory Visit | Attending: Obstetrics & Gynecology | Admitting: Obstetrics & Gynecology

## 2017-06-04 DIAGNOSIS — R928 Other abnormal and inconclusive findings on diagnostic imaging of breast: Secondary | ICD-10-CM | POA: Diagnosis not present

## 2017-06-04 DIAGNOSIS — Z853 Personal history of malignant neoplasm of breast: Secondary | ICD-10-CM

## 2017-06-17 ENCOUNTER — Encounter: Payer: Self-pay | Admitting: Internal Medicine

## 2017-06-17 ENCOUNTER — Encounter (INDEPENDENT_AMBULATORY_CARE_PROVIDER_SITE_OTHER): Payer: Self-pay

## 2017-06-17 ENCOUNTER — Ambulatory Visit (INDEPENDENT_AMBULATORY_CARE_PROVIDER_SITE_OTHER): Payer: Medicare Other | Admitting: *Deleted

## 2017-06-17 ENCOUNTER — Ambulatory Visit (INDEPENDENT_AMBULATORY_CARE_PROVIDER_SITE_OTHER): Payer: Medicare Other | Admitting: Internal Medicine

## 2017-06-17 VITALS — BP 130/86 | HR 81 | Ht 63.0 in | Wt 194.8 lb

## 2017-06-17 DIAGNOSIS — I10 Essential (primary) hypertension: Secondary | ICD-10-CM | POA: Diagnosis not present

## 2017-06-17 DIAGNOSIS — I495 Sick sinus syndrome: Secondary | ICD-10-CM

## 2017-06-17 DIAGNOSIS — I480 Paroxysmal atrial fibrillation: Secondary | ICD-10-CM

## 2017-06-17 NOTE — Progress Notes (Signed)
PCP: [name], MD Primary Cardiologist: Dr Harl Bowie Primary EP: Dr [name]  Heidi Lopez is a 71 y.o. female who presents today for routine electrophysiology followup.  Since last being seen in our clinic, the patient reports doing very well.  She is tolerating her afib well.  Denies any recent symptoms of pauses.  Today, she denies symptoms of palpitations, chest pain, shortness of breath,  lower extremity edema, dizziness, presyncope, or syncope.  The patient is otherwise without complaint today.   Past Medical History:  Diagnosis Date  . Breast cancer ([place])   . Cancer of breast [facility]) July 2010   s/p XRT 2010 and again in 2011  . Chronic coughing   . Cramps, muscle, general    severe breast cramping   . Dysrhythmia    atrial flutter  . Glaucoma   . Glaucoma    left eye   . Joint pain    foot and knee pain  . Lymphedema of arm    left  . Night sweats   . Osteopenia 01/24/2015  . Pain    breast pain  . Paroxysmal atrial fibrillation ([place]) 05/2014  . Sinus problem   . Wears glasses    Past Surgical History:  Procedure Laterality Date  . BREAST SURGERY  2010- right and 2011-left   for cancer  . CARDIOVERSION N/A 10/21/2014   Procedure: CARDIOVERSION;  Surgeon: [name], MD;  Location: [place];  Service: Cardiovascular;  Laterality: N/A;  . COLONOSCOPY N/A 02/05/2013   Procedure: COLONOSCOPY;  Surgeon: [name], MD;  [facility];  Service: Endoscopy;  Laterality: N/A;  830  . ELECTROPHYSIOLOGIC STUDY N/A 02/24/2015   PVI and CTI ablation by Dr [name]  . EP IMPLANTABLE DEVICE N/A 08/24/2015   Procedure: Loop Recorder Insertion;  Surgeon: Thompson Grayer, MD;  Location: [place] CV LAB;  Service: Cardiovascular;  Laterality: N/A;  . HYSTEROSCOPY W/D&C N/A 12/29/2014   Procedure: DILATATION AND CURETTAGE /HYSTEROSCOPY;  Surgeon: [name], MD;  [facility];  Service: Gynecology;  Laterality: N/A;  . TEE WITHOUT CARDIOVERSION N/A 02/24/2015   Procedure: TRANSESOPHAGEAL ECHOCARDIOGRAM (TEE);  Surgeon: [name], MD;  Location: [place];  Service: Cardiovascular;  Laterality: N/A;  . [place]- all systems are reviewed and negatives except as per HPI above  Current Outpatient Prescriptions  Medication Sig Dispense Refill  . albuterol (PROVENTIL HFA;VENTOLIN HFA) 108 (90 BASE) MCG/ACT inhaler Inhale into the lungs every 6 (six) hours as needed for wheezing or shortness of breath. Reported on 11/28/2015    . brimonidine (ALPHAGAN) 0.2 % ophthalmic solution Place 1 drop into the left eye 2 (two) times daily. Reported on 03/06/2016    . Calcium Carbonate-Vit D-Min (CALTRATE 600+D PLUS PO) Take 1 tablet by mouth daily.    Marland Kitchen CARTIA XT 180 MG 24 hr capsule TAKE 1 CAPSULE BY MOUTH ONCE DAILY 90 capsule 2  . Cholecalciferol (VITAMIN D PO) Take 2,000 Units by mouth daily.     . Coenzyme Q10 (COQ-10) 100 MG CAPS Take 1 tablet by mouth daily.    . Cranberry 500 MG CAPS Take 500 mg by mouth daily.     Marland Kitchen diltiazem (CARDIZEM) 30 MG tablet Cardizem 30mg  -- take 1 tablet by mouth every 4 hours AS NEEDED for heart rate >100 as long as blood pressure >100.    Marland Kitchen ELIQUIS 5 MG TABS tablet TAKE ONE TABLET BY MOUTH TWICE DAILY  180 tablet 3  . furosemide (LASIX) 20 MG tablet Take 1 tablet (20 mg total) by mouth daily as needed. Take daily as needed for swelling (Patient not taking: Reported on 05/07/2017) 90 tablet 3  . letrozole (FEMARA) 2.5 MG tablet TAKE 1 TABLET BY MOUTH ONCE DAILY 90 tablet 1  . loratadine (CLARITIN) 10 MG tablet Take 10 mg by mouth daily as needed for allergies. Reported on 09/20/2015    . LUMIGAN 0.01 % SOLN Place 1 drop into both eyes at bedtime.    . Magnesium 250 MG TABS Take 1 tablet (250 mg total) by mouth daily.  0  . Multiple Vitamin (MULTIVITAMIN) tablet Take 1 tablet by mouth daily.     . Omega-3 Fatty Acids (FISH OIL) 1200 MG CAPS Take 1 capsule by mouth daily.    . solifenacin (VESICARE) 10 MG tablet  Take 1 tablet (10 mg total) by mouth daily. 30 tablet 11  . SOTALOL AF 80 MG TABS Take 1 tablet (80 mg total) by mouth 2 (two) times daily. 180 tablet 2   No current facility-administered medications for this visit.     Physical Exam: Vitals:   06/17/17 1615  Height: 5\' 3"  (1.6 m)    GEN- The patient is well appearing, alert and oriented x 3 today.   Head- normocephalic, atraumatic Eyes-  Sclera clear, conjunctiva pink Ears- hearing intact Oropharynx- clear Lungs- Clear to ausculation bilaterally, normal work of breathing Heart- Regular rate and rhythm, no murmurs, rubs or gallops, PMI not laterally displaced GI- soft, NT, ND, + BS Extremities- no clubbing, cyanosis, or edema  EKG tracing ordered today is personally reviewed and shows coarse afib, V rate 81 bpm, stable qtc  Assessment and Plan:  1. Paroxysmal afib/ atypical atrial flutter ILR interrogation reveals that her AF burden is now 28.9 % (16% on last visit).  She is s/p PVI and CTI ablation 6/16 but has declined additional ablations Continue rate control I have offered increasing sotalol or repeat ablation.  She declines both.  I worry that her afib burden will continue to increase and have made her aware of my concerns.  She wishes to make no changes at this time. Bmet, mg today  (on sotalol) Cbc, bmet (on eliquis)  2. Sick sinus syndrome Previously had post termination pauses She feels that these are improved and does not wish to have PPM at this time. Last post termination pause was 5/18.  3. HTN Stable No change required today  Return to see EP PA in 6 months   Thompson Grayer MD, [facility] 06/17/2017 4:23 PM

## 2017-06-17 NOTE — Patient Instructions (Addendum)
Medication Instructions:  Your physician recommends that you continue on your current medications as directed. Please refer to the Current Medication list given to you today.   Labwork: Your physician recommends that you return for lab work today: BMP/CBC/Mag   Testing/Procedures: None ordered   Follow-Up:  Remote monitoring is used to monitor your Pacemaker from home. This monitoring reduces the number of office visits required to check your device to one time per year. It allows [place] to keep an eye on the functioning of your device to ensure it is working properly. You are scheduled for a device check from home on 07/15/17. You may send your transmission at any time that day. If you have a wireless device, the transmission will be sent automatically. After your physician reviews your transmission, you will receive a postcard with your next transmission date.      Your physician wants you to follow-up in: 6 months with Tommye Standard, NP You will receive a reminder letter in the mail two months in advance. If you don't receive a letter, please call our office to schedule the follow-up appointment.

## 2017-06-17 NOTE — Progress Notes (Signed)
Carelink Summary Report / Loop Recorder 

## 2017-06-18 ENCOUNTER — Ambulatory Visit (INDEPENDENT_AMBULATORY_CARE_PROVIDER_SITE_OTHER): Payer: Medicare Other | Admitting: Obstetrics & Gynecology

## 2017-06-18 VITALS — BP 128/72 | HR 62 | Ht 63.0 in | Wt 192.0 lb

## 2017-06-18 DIAGNOSIS — N3941 Urge incontinence: Secondary | ICD-10-CM | POA: Diagnosis not present

## 2017-06-18 DIAGNOSIS — Z23 Encounter for immunization: Secondary | ICD-10-CM | POA: Diagnosis not present

## 2017-06-18 LAB — CBC WITH DIFFERENTIAL/PLATELET
BASOS: 0 %
Basophils Absolute: 0 10*3/uL (ref 0.0–0.2)
EOS (ABSOLUTE): 0.2 10*3/uL (ref 0.0–0.4)
EOS: 2 %
HEMATOCRIT: 41.9 % (ref 34.0–46.6)
Hemoglobin: 14.3 g/dL (ref 11.1–15.9)
Immature Grans (Abs): 0 10*3/uL (ref 0.0–0.1)
Immature Granulocytes: 0 %
LYMPHS ABS: 3.8 10*3/uL — AB (ref 0.7–3.1)
Lymphs: 38 %
MCH: 30.8 pg (ref 26.6–33.0)
MCHC: 34.1 g/dL (ref 31.5–35.7)
MCV: 90 fL (ref 79–97)
MONOS ABS: 1.1 10*3/uL — AB (ref 0.1–0.9)
Monocytes: 10 %
NEUTROS ABS: 5 10*3/uL (ref 1.4–7.0)
Neutrophils: 50 %
Platelets: 271 10*3/uL (ref 150–379)
RBC: 4.65 x10E6/uL (ref 3.77–5.28)
RDW: 13.3 % (ref 12.3–15.4)
WBC: 10.2 10*3/uL (ref 3.4–10.8)

## 2017-06-18 LAB — CUP PACEART INCLINIC DEVICE CHECK
Implantable Pulse Generator Implant Date: 20161207
MDC IDC SESS DTM: 20181002113704

## 2017-06-18 LAB — BASIC METABOLIC PANEL
BUN / CREAT RATIO: 14 (ref 12–28)
BUN: 10 mg/dL (ref 8–27)
CO2: 24 mmol/L (ref 20–29)
Calcium: 10.5 mg/dL — ABNORMAL HIGH (ref 8.7–10.3)
Chloride: 101 mmol/L (ref 96–106)
Creatinine, Ser: 0.74 mg/dL (ref 0.57–1.00)
GFR calc Af Amer: 94 mL/min/{1.73_m2} (ref >59)
GFR, EST NON AFRICAN AMERICAN: 81 mL/min/{1.73_m2} (ref >59)
GLUCOSE: 84 mg/dL (ref 65–99)
Potassium: 4.4 mmol/L (ref 3.5–5.2)
SODIUM: 140 mmol/L (ref 134–144)

## 2017-06-18 LAB — MAGNESIUM: Magnesium: 2.2 mg/dL (ref 1.6–2.3)

## 2017-06-18 MED ORDER — MIRABEGRON ER 50 MG PO TB24: 50.0000 mg | ORAL_TABLET | Freq: Every day | ORAL | 11 refills | Status: DC

## 2017-06-18 NOTE — Progress Notes (Signed)
Chief Complaint  Patient presents with  . Follow-up    Blood pressure 128/72, pulse 62, height 5\' 3"  (1.6 m), weight 192 lb (87.1 kg).  71 y.o. No obstetric history on file. No LMP recorded. Patient is postmenopausal. The current method of family planning is tubal ligation and post menopausal status.  Outpatient Encounter Prescriptions as of 06/18/2017  Medication Sig  . albuterol (PROVENTIL HFA;VENTOLIN HFA) 108 (90 BASE) MCG/ACT inhaler Inhale into the lungs every 6 (six) hours as needed for wheezing or shortness of breath. Reported on 11/28/2015  . brimonidine (ALPHAGAN) 0.2 % ophthalmic solution Place 1 drop into the left eye 2 (two) times daily. Reported on 03/06/2016  . Calcium Carbonate-Vit D-Min (CALTRATE 600+D PLUS PO) Take 1 tablet by mouth daily.  Marland Kitchen CARTIA XT 180 MG 24 hr capsule TAKE 1 CAPSULE BY MOUTH ONCE DAILY  . Cholecalciferol (VITAMIN D PO) Take 2,000 Units by mouth daily.   . Coenzyme Q10 (COQ-10) 100 MG CAPS Take 1 tablet by mouth daily.  . Cranberry 500 MG CAPS Take 500 mg by mouth daily.   Marland Kitchen diltiazem (CARDIZEM) 30 MG tablet Cardizem 30mg  -- take 1 tablet by mouth every 4 hours AS NEEDED for heart rate >100 as long as blood pressure >100.  Marland Kitchen ELIQUIS 5 MG TABS tablet TAKE ONE TABLET BY MOUTH TWICE DAILY  . furosemide (LASIX) 20 MG tablet Take 1 tablet (20 mg total) by mouth daily as needed. Take daily as needed for swelling  . letrozole (FEMARA) 2.5 MG tablet TAKE 1 TABLET BY MOUTH ONCE DAILY  . loratadine (CLARITIN) 10 MG tablet Take 10 mg by mouth daily as needed for allergies. Reported on 09/20/2015  . LUMIGAN 0.01 % SOLN Place 1 drop into both eyes at bedtime.  . Magnesium 250 MG TABS Take 1 tablet (250 mg total) by mouth daily.  . Multiple Vitamin (MULTIVITAMIN) tablet Take 1 tablet by mouth daily.   . Omega-3 Fatty Acids (FISH OIL) 1200 MG CAPS Take 1 capsule by mouth daily.  Marland Kitchen SOTALOL AF 80 MG TABS Take 1 tablet (80 mg total) by mouth 2 (two) times daily.    . mirabegron ER (MYRBETRIQ) 50 MG TB24 tablet Take 1 tablet (50 mg total) by mouth daily.  . solifenacin (VESICARE) 10 MG tablet Take 1 tablet (10 mg total) by mouth daily. (Patient not taking: Reported on 06/18/2017)   No facility-administered encounter medications on file as of 06/18/2017.     Subjective Heidi Lopez  is seen back today for follow-up after I saw her last month for increasing problems with urinary frequency and urgency Unfortunately she had an adverse reaction to the best care to one tablet and set her blood pressure shot way up and it precipitated an episode of atrial fibrillation As result showing to one tablet She is here today for her scheduled follow-up she did not call her her informed me and told her day of her adverse reaction  She of course is still having her symptoms so we are going to give Myrbetriq  50 mg a try It has a very clean side effect profile so hopefully this will not causing similar problems plus it occupies a vastly different receptor and the body  Objective   Pertinent ROS No burning with urination, frequency or urgency No nausea, vomiting or diarrhea Nor fever chills or other constitutional symptoms   Labs or studies No new    Impression Diagnoses this Encounter::   ICD-10-CM  1. Urge incontinence N39.41     Established relevant diagnosis(es):   Plan/Recommendations: Meds ordered this encounter  Medications  . mirabegron ER (MYRBETRIQ) 50 MG TB24 tablet    Sig: Take 1 tablet (50 mg total) by mouth daily.    Dispense:  30 tablet    Refill:  11    Patient had an adverse reaction to the vesicare, her BP went way up and she had to stop the vesicare As a result, I am switching her to [employer] or Scans Ordered: No orders of the defined types were placed in this encounter.   Management:: Switch to myrbetriq 50 mg at bedtime  Follow up Return if symptoms worsen or fail to improve.  Face to face time:  15  minutes  Greater than 50% of the visit time was spent in counseling and coordination of care with the patient.  The summary and outline of the counseling and care coordination is summarized in the note above.   All questions were answered.  Past Medical History:  Diagnosis Date  . Breast cancer ([place])   . Cancer of breast [facility]) July 2010   s/p XRT 2010 and again in 2011  . Chronic coughing   . Cramps, muscle, general    severe breast cramping   . Dysrhythmia    atrial flutter  . Glaucoma   . Glaucoma    left eye   . Joint pain    foot and knee pain  . Lymphedema of arm    left  . Night sweats   . Osteopenia 01/24/2015  . Pain    breast pain  . Paroxysmal atrial fibrillation ([place]) 05/2014  . Sinus problem   . Wears glasses     Past Surgical History:  Procedure Laterality Date  . BREAST SURGERY  2010- right and 2011-left   for cancer  . CARDIOVERSION N/A 10/21/2014   Procedure: CARDIOVERSION;  Surgeon: [name], MD;  Location: [place];  Service: Cardiovascular;  Laterality: N/A;  . COLONOSCOPY N/A 02/05/2013   Procedure: COLONOSCOPY;  Surgeon: [name], MD;  [facility];  Service: Endoscopy;  Laterality: N/A;  830  . ELECTROPHYSIOLOGIC STUDY N/A 02/24/2015   PVI and CTI ablation by Dr [name]  . EP IMPLANTABLE DEVICE N/A 08/24/2015   Procedure: Loop Recorder Insertion;  Surgeon: Thompson Grayer, MD;  Location: [place] CV LAB;  Service: Cardiovascular;  Laterality: N/A;  . HYSTEROSCOPY W/D&C N/A 12/29/2014   Procedure: DILATATION AND CURETTAGE /HYSTEROSCOPY;  Surgeon: [name], MD;  [facility];  Service: Gynecology;  Laterality: N/A;  . TEE WITHOUT CARDIOVERSION N/A 02/24/2015   Procedure: TRANSESOPHAGEAL ECHOCARDIOGRAM (TEE);  Surgeon: [name], MD;  Location: [place];  Service: Cardiovascular;  Laterality: N/A;  . TUBAL LIGATION  1984    OB History    No data available      Allergies  Allergen Reactions  . Metoprolol      Makes her feel bad  . Vesicare [Solifenacin]     Severe hypertension and tachycardia   . Reglan [Metoclopramide] Other (See Comments)    Causes severe jitters    Social History   Social History  . Marital status: Widowed    Spouse name: N/A  . Number of children: N/A  . Years of education: N/A   Social History Main Topics  . Smoking status: Former Smoker    Packs/day: 1.00    Years: 2.00    Types:  Cigarettes    Start date: 09/17/1962    Quit date: 11/16/1966  . Smokeless tobacco: Never Used  . Alcohol use No  . Drug use: No  . Sexual activity: Not Currently    Birth control/ protection: Post-menopausal   Other Topics Concern  . Not on file   Social History Narrative   Lives alone in [place]   Retired   Worked previously for [employer] and [employer]    Family History  Problem Relation Age of Onset  . Heart disease Mother   . Stroke Father   . Cancer Father   . Early death Brother   . Cancer Sister   . Cancer Sister   . Cancer Sister   . Diabetes Daughter

## 2017-06-20 ENCOUNTER — Encounter (HOSPITAL_COMMUNITY): Payer: Medicare Other | Attending: Oncology | Admitting: Oncology

## 2017-06-20 ENCOUNTER — Telehealth: Payer: Self-pay | Admitting: *Deleted

## 2017-06-20 VITALS — BP 145/67 | HR 55 | Resp 16 | Ht 63.0 in | Wt 193.5 lb

## 2017-06-20 DIAGNOSIS — Z853 Personal history of malignant neoplasm of breast: Secondary | ICD-10-CM | POA: Diagnosis present

## 2017-06-20 DIAGNOSIS — M8580 Other specified disorders of bone density and structure, unspecified site: Secondary | ICD-10-CM

## 2017-06-20 LAB — CUP PACEART REMOTE DEVICE CHECK
Date Time Interrogation Session: 20180929121115
Implantable Pulse Generator Implant Date: 20161207

## 2017-06-20 NOTE — Telephone Encounter (Signed)
Patient informed. 

## 2017-06-20 NOTE — Progress Notes (Signed)
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 but for negative sentinel nodes. Estrogen receptor +95% progesterone receptor +96% Ki-67 marker low at 9% HER-2/neu not amplified status post lumpectomy followed by radiation therapy. Her surgery was 04/26/2010 and she started tamoxifen on 08/16/2010 after radiation therapy finished on 07/21/2010. She is having tremendous sweating and hot flashes .   Tamoxifen 10 mg daily started by Dr. 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 Estrogen receptors were 96% positive, progesterone  33% positive Ki-67 marker low 8% HER-2/neu negative and she did not receive any hormonal therapy after the radiation on the right side.  DEXA on 06/30/2012 with osteopenia but a noted INCREASE in BMD   Breast Cancer Index with high likelihood of benefit from extended endocrine therapy. BCI prognostic high risk category.   CURRENT THERAPY: femara  INTERVAL HISTORY: Heidi Lopez 71 y.o. female returns for follow-up of bilateral breast cancers.   She has been tolerating Femara well. She is still taking calcium and vitamin D. Patient states that she gets occasional hot flashes but they're not as severe as when she was taking tamoxifen. Denies any recent infections, chest pain, SOB, abdominal pain, or any other concerns. Patient had a bilateral mammogram on 06/04/2017 which was BI-RADS 2.  MEDICAL HISTORY: Past Medical History:  Diagnosis Date  . Breast cancer ([place])   . Cancer of breast [facility]) July 2010   s/p XRT 2010 and again in 2011  . Chronic coughing   . Cramps, muscle, general    severe breast cramping   . Dysrhythmia    atrial flutter  . Glaucoma   . Glaucoma    left eye    . Joint pain    foot and knee pain  . Lymphedema of arm    left  . Night sweats   . Osteopenia 01/24/2015  . Pain    breast pain  . Paroxysmal atrial fibrillation ([place]) 05/2014  . Sinus problem   . Wears glasses     has History of breast cancer, bilateral: Right T1N0 Lumpectomy July 2010, Left T1c, N0 Lumpectomy 04/26/2010.; Pain in joint, ankle and foot; A-fib ([place]); Snoring; Atypical glandular cells of undetermined significance (AGUS) on cervical Pap smear; Thickened endometrium; Osteopenia; and Atrial flutter ([place]) on her problem list.     is allergic to metoprolol; vesicare [solifenacin]; and reglan [metoclopramide].  Ms. Goodlin had no medications administered during this visit.  SURGICAL HISTORY: Past Surgical History:  Procedure Laterality Date  . BREAST SURGERY  2010- right and 2011-left   for cancer  . CARDIOVERSION N/A 10/21/2014   Procedure: CARDIOVERSION;  Surgeon: [name], MD;  Location: [place];  Service: Cardiovascular;  Laterality: N/A;  . COLONOSCOPY N/A 02/05/2013   Procedure: COLONOSCOPY;  Surgeon: [name], MD;  [facility];  Service: Endoscopy;  Laterality: N/A;  830  . ELECTROPHYSIOLOGIC STUDY N/A 02/24/2015   PVI and CTI ablation by Dr [name]  . EP IMPLANTABLE DEVICE N/A 08/24/2015   Procedure: Loop Recorder Insertion;  Surgeon: Thompson Grayer, MD;  Location: [place] CV LAB;  Service: Cardiovascular;  Laterality: N/A;  . HYSTEROSCOPY W/D&C N/A 12/29/2014   Procedure: DILATATION AND CURETTAGE /HYSTEROSCOPY;  Surgeon:  [name], MD;  [facility];  Service: Gynecology;  Laterality: N/A;  . TEE WITHOUT CARDIOVERSION N/A 02/24/2015   Procedure: TRANSESOPHAGEAL ECHOCARDIOGRAM (TEE);  Surgeon: [name], MD;  Location: [place];  Service: Cardiovascular;  Laterality: N/A;  . TUBAL LIGATION  1984    SOCIAL HISTORY: Social History   Social History  . Marital status: Widowed    Spouse name: N/A  . Number of children:  N/A  . Years of education: N/A   Occupational History  . Not on file.   Social History Main Topics  . Smoking status: Former Smoker    Packs/day: 1.00    Years: 2.00    Types: Cigarettes    Start date: 09/17/1962    Quit date: 11/16/1966  . Smokeless tobacco: Never Used  . Alcohol use No  . Drug use: No  . Sexual activity: Not Currently    Birth control/ protection: Post-menopausal   Other Topics Concern  . Not on file   Social History Narrative   Lives alone in [place]   Retired   Worked previously for [employer] and [employer]    FAMILY HISTORY: Family History  Problem Relation Age of Onset  . Heart disease Mother   . Stroke Father   . Cancer Father   . Early death Brother   . Cancer Sister   . Cancer Sister   . Cancer Sister   . Diabetes Daughter     Review of Systems  Constitutional: Negative.        No hot flashes  HENT: Negative.   Eyes: Negative.   Respiratory: Negative.  Negative for shortness of breath.   Cardiovascular: Negative.  Negative for chest pain.  Gastrointestinal: Negative.  Negative for abdominal pain.  Genitourinary: Negative.   Musculoskeletal:       Occasional joint stiffness (hands/feet)  Skin: Negative.   Neurological: Negative.   Endo/Heme/Allergies: Negative.   Psychiatric/Behavioral: Negative.   All other systems reviewed and are negative. 14 point review of systems was performed and is negative except as detailed under history of present illness and above  PHYSICAL EXAMINATION  ECOG PERFORMANCE STATUS: 0 - Asymptomatic  Vitals:   06/20/17 1000  BP: (!) 145/67  Pulse: (!) 55  Resp: 16  SpO2: 99%   Physical Exam  Constitutional: She is oriented to person, place, and time and well-developed, well-nourished, and in no distress.  HENT:  Head: Normocephalic and atraumatic.  Eyes: Pupils are equal, round, and reactive to light. EOM are normal.  Neck: Normal range of motion. Neck supple.  Cardiovascular: Normal rate,  regular rhythm and normal heart sounds.   Pulmonary/Chest: Effort normal and breath sounds normal. Right breast exhibits no mass, no nipple discharge and no skin change. Left breast exhibits no mass, no nipple discharge and no skin change.    Abdominal: Soft. Bowel sounds are normal.  Musculoskeletal: Normal range of motion.  Neurological: She is alert and oriented to person, place, and time. Gait normal.  Skin: Skin is warm and dry.  Nursing note and vitals reviewed.  LABORATORY DATA: I have reviewed the data as listed  CBC    Component Value Date/Time   WBC 10.2 06/17/2017 1654   WBC 9.1 11/23/2016 1335   RBC 4.65 06/17/2017 1654   RBC 4.48 11/23/2016 1335   HGB 14.3 06/17/2017 1654   HCT 41.9 06/17/2017 1654   PLT 271 06/17/2017 1654   MCV 90 06/17/2017 1654   MCH 30.8 06/17/2017 1654  MCH 31.0 11/23/2016 1335   MCHC 34.1 06/17/2017 1654   MCHC 34.8 11/23/2016 1335   RDW 13.3 06/17/2017 1654   LYMPHSABS 3.8 (H) 06/17/2017 1654   MONOABS 1.1 (H) 11/23/2016 1335   EOSABS 0.2 06/17/2017 1654   BASOSABS 0.0 06/17/2017 1654   CMP     Component Value Date/Time   NA 140 06/17/2017 1654   K 4.4 06/17/2017 1654   CL 101 06/17/2017 1654   CO2 24 06/17/2017 1654   GLUCOSE 84 06/17/2017 1654   GLUCOSE 89 02/18/2017 1530   BUN 10 06/17/2017 1654   CREATININE 0.74 06/17/2017 1654   CREATININE 0.69 10/17/2015 1533   CALCIUM 10.5 (H) 06/17/2017 1654   PROT 6.9 11/23/2016 1335   ALBUMIN 4.1 11/23/2016 1335   AST 31 11/23/2016 1335   ALT 37 11/23/2016 1335   ALKPHOS 67 11/23/2016 1335   BILITOT 0.5 11/23/2016 1335   GFRNONAA 81 06/17/2017 1654   GFRAA 94 06/17/2017 1654   RADIOLOGY: I have reviewed the images below and agree with the reported results  DEXA Scan 11/22/2016 DualFemur Neck Left 11/22/2016 71.5 Osteopenia -1.4 0.848 g/cm2 2.8% - DualFemur Neck Left 02/15/2015 69.7 Osteopenia -1.5 0.825 g/cm2 -7.6% Yes DualFemur Neck Left 06/30/2012 67.1 Normal -1.0 0.893  g/cm2 0.2% - DualFemur Neck Left 05/24/2009 64.0 Osteopenia -1.1 0.891 g/cm2 - - - Left Forearm Radius 33% 11/22/2016 71.5 Osteopenia -2.3 0.551 g/cm2  ASSESSMENT: BMD as determined from Forearm Radius 33% is 0.551 g/cm2 with a T-Score of -2.3. This patient is considered osteopenic according to [facility]) criteria. Compared with the prior study on 02/15/2015, the BMD of the left femoral neck shows no statistically significant change. (Lumbar spine was not utilized due to advanced degenerative changes.)  PATHOLOGY:L DIAGNOSIS  ASSESSMENT and THERAPY PLAN:   History of ER+ bilateral breast cancer Intolerance to '20mg'$  Tamoxifen secondary to hot flashes Intolerance to aromasin secondary to hot flashes Abnormal endometrial lining, with hysteroscopy and D&C without significant pathology BCI prognostic; high risk category, BCI predictive high likelihood of benefit from extended endocrine RX Osteopenia, on calcium and vitamin D Ongoing Dental Issues Femara Joint pain, tolerable   Clinically NED on breast exam today.  I reviewed the DEXA scan with the patient in detail. She has osteopenia. I recommended Prolia, but she declined, due to issues with her teeth after radiation. I discussed placement on oral fosamax, however patient refused as well.  She does not want to take anything other than calcium and vitamin D. Repeat DEXA scan every year, next one scheduled for 11/26/2017. I have told her that if her bone density worsens, then I would recommend that she start fosamax.   Reviewed last mammogram with the patient. Repeat annual mammogram in 05/2018.  Continue Femara, tolerating well. Continue calcium-vitamin D.  Reviewed her BMP and CBC from 06/17/17 in detail with her today. Labs are normal.  She will return for follow up in 6 months with CBC, CMP.   All questions were answered. The patient knows to call the clinic with any problems, questions or concerns. We can  certainly see the patient much sooner if necessary.   This note was electronically signed.  Twana First, MD

## 2017-06-20 NOTE — Telephone Encounter (Signed)
-----   Message from Thompson Grayer, MD sent at 06/19/2017  5:14 PM EDT ----- Results reviewed.  Heidi Lopez, please inform pt of result. I will route to primary care also.

## 2017-07-15 ENCOUNTER — Ambulatory Visit (INDEPENDENT_AMBULATORY_CARE_PROVIDER_SITE_OTHER): Payer: Medicare Other | Admitting: *Deleted

## 2017-07-15 DIAGNOSIS — I480 Paroxysmal atrial fibrillation: Secondary | ICD-10-CM | POA: Diagnosis not present

## 2017-07-15 NOTE — Progress Notes (Signed)
Carelink Summary Report / Loop Recorder 

## 2017-07-18 DIAGNOSIS — Z853 Personal history of malignant neoplasm of breast: Secondary | ICD-10-CM | POA: Diagnosis not present

## 2017-07-19 LAB — CUP PACEART REMOTE DEVICE CHECK
Implantable Pulse Generator Implant Date: 20161207
MDC IDC SESS DTM: 20181029160936

## 2017-08-14 ENCOUNTER — Ambulatory Visit (INDEPENDENT_AMBULATORY_CARE_PROVIDER_SITE_OTHER): Payer: Medicare Other | Admitting: *Deleted

## 2017-08-14 DIAGNOSIS — I480 Paroxysmal atrial fibrillation: Secondary | ICD-10-CM | POA: Diagnosis not present

## 2017-08-14 NOTE — Progress Notes (Signed)
Carelink Summary Report / Loop Recorder 

## 2017-08-29 LAB — CUP PACEART REMOTE DEVICE CHECK
MDC IDC PG IMPLANT DT: 20161207
MDC IDC SESS DTM: 20181128171055

## 2017-09-13 ENCOUNTER — Ambulatory Visit (INDEPENDENT_AMBULATORY_CARE_PROVIDER_SITE_OTHER): Payer: Medicare Other | Admitting: *Deleted

## 2017-09-13 DIAGNOSIS — I480 Paroxysmal atrial fibrillation: Secondary | ICD-10-CM

## 2017-09-16 NOTE — Progress Notes (Signed)
Carelink Summary Report / Loop Recorder 

## 2017-09-18 DIAGNOSIS — H401121 Primary open-angle glaucoma, left eye, mild stage: Secondary | ICD-10-CM | POA: Diagnosis not present

## 2017-09-23 LAB — CUP PACEART REMOTE DEVICE CHECK
Implantable Pulse Generator Implant Date: 20161207
MDC IDC SESS DTM: 20181228194146

## 2017-10-09 ENCOUNTER — Telehealth: Payer: Self-pay | Admitting: Cardiology

## 2017-10-09 NOTE — Telephone Encounter (Signed)
Spoke w/ pt and requested that she send a manual transmission b/c her home monitor has not updated in at least 14 days.   

## 2017-10-14 ENCOUNTER — Ambulatory Visit (INDEPENDENT_AMBULATORY_CARE_PROVIDER_SITE_OTHER): Payer: Medicare Other | Admitting: *Deleted

## 2017-10-14 DIAGNOSIS — I480 Paroxysmal atrial fibrillation: Secondary | ICD-10-CM

## 2017-10-14 NOTE — Progress Notes (Signed)
Carelink Summary Report / Loop Recorder 

## 2017-10-24 LAB — CUP PACEART REMOTE DEVICE CHECK
Implantable Pulse Generator Implant Date: 20161207
MDC IDC SESS DTM: 20190127204852

## 2017-11-14 ENCOUNTER — Other Ambulatory Visit (HOSPITAL_COMMUNITY): Payer: Self-pay | Admitting: Oncology

## 2017-11-14 DIAGNOSIS — M85832 Other specified disorders of bone density and structure, left forearm: Secondary | ICD-10-CM

## 2017-11-15 ENCOUNTER — Ambulatory Visit (INDEPENDENT_AMBULATORY_CARE_PROVIDER_SITE_OTHER): Payer: Medicare Other | Admitting: *Deleted

## 2017-11-15 DIAGNOSIS — I480 Paroxysmal atrial fibrillation: Secondary | ICD-10-CM

## 2017-11-18 NOTE — Progress Notes (Signed)
Carelink Summary Report / Loop Recorder 

## 2017-11-26 ENCOUNTER — Other Ambulatory Visit (HOSPITAL_COMMUNITY): Payer: Medicare Other

## 2017-11-27 ENCOUNTER — Ambulatory Visit (HOSPITAL_COMMUNITY)
Admission: RE | Admit: 2017-11-27 | Discharge: 2017-11-27 | Disposition: A | Discharge status: Home / Self Care | Payer: Medicare Other | Source: Ambulatory Visit | Attending: Oncology | Admitting: Oncology

## 2017-11-27 DIAGNOSIS — M85832 Other specified disorders of bone density and structure, left forearm: Secondary | ICD-10-CM | POA: Diagnosis present

## 2017-11-27 DIAGNOSIS — M8588 Other specified disorders of bone density and structure, other site: Secondary | ICD-10-CM | POA: Insufficient documentation

## 2017-11-27 DIAGNOSIS — Z780 Asymptomatic menopausal state: Secondary | ICD-10-CM | POA: Diagnosis not present

## 2017-11-27 DIAGNOSIS — M85852 Other specified disorders of bone density and structure, left thigh: Secondary | ICD-10-CM | POA: Diagnosis not present

## 2017-12-06 NOTE — Progress Notes (Addendum)
Cardiology Office Note Date:  12/11/2017  Patient ID:  Heidi Lopez, [place] August 30, 1946, MRN [id number] PCP:  [name], MD  Cardiologist:  Dr. Harl Bowie Electrophysiologist: Dr. [name]   Chief Complaint: planned f/u  History of Present Illness: Heidi Lopez is a 72 y.o. female with history of glaucoma, breast ca tx w/surgery and XRT 2010/2011, and PAFib/flutter s/p PVI/CTI ablation 2016 underwent loop implant 08/2015, HTN  She comes in today to be seen for Dr. [name].  She last saw him I Oct 2018, at that time he noted her AF burden was up to 28.9 % from 16%.  He discussed concerns this would continue to rise and suggested up-titration of her sotalol and/or repeat ablation, the patient declined both.  Also noted was hx of post-termination pauses historically though the patient felt symptoms improved and did not want to pursue pacing.  Sotalol/ELiquis labs were done (and looked OK). She was planned for 6 mo f/u.  The patient comes in today feeling well.  She is in AFlutter currently rate 70's and unaware.  She is feeling very well, no CP, palpitations or cardiac awareness at all.  She has not had any brady symptoms, no weak spells, no near syncope or syncope.  She states after the reduction in her diltiazem some months ago she has not had any kind of symptoms.  She in fact states in the last few months she has felt better then she has in a long time.  No CP.  She is tolerating her medicines, no bleeding or signs of bleeding, reports compliance with her Eliquis   Device information: MDT ILR, implanted 08/24/2015, AFib management, Dr. [name]    Past Medical History:  Diagnosis Date  . Breast cancer ([place])   . Cancer of breast [facility]) July 2010   s/p XRT 2010 and again in 2011  . Chronic coughing   . Cramps, muscle, general    severe breast cramping   . Dysrhythmia    atrial flutter  . Glaucoma   . Glaucoma    left eye   . Joint pain    foot and knee pain  . Lymphedema of arm    left    . Night sweats   . Osteopenia 01/24/2015  . Pain    breast pain  . Paroxysmal atrial fibrillation ([place]) 05/2014  . Sinus problem   . Wears glasses     Past Surgical History:  Procedure Laterality Date  . BREAST SURGERY  2010- right and 2011-left   for cancer  . CARDIOVERSION N/A 10/21/2014   Procedure: CARDIOVERSION;  Surgeon: [name], MD;  Location: [facility];  Service: Cardiovascular;  Laterality: N/A;  . COLONOSCOPY N/A 02/05/2013   Procedure: COLONOSCOPY;  Surgeon: [name], MD;  [facility];  Service: Endoscopy;  Laterality: N/A;  830  . ELECTROPHYSIOLOGIC STUDY N/A 02/24/2015   PVI and CTI ablation by Dr [name]  . EP IMPLANTABLE DEVICE N/A 08/24/2015   Procedure: Loop Recorder Insertion;  Surgeon: Thompson Grayer, MD;  Location: [place] CV LAB;  Service: Cardiovascular;  Laterality: N/A;  . HYSTEROSCOPY W/D&C N/A 12/29/2014   Procedure: DILATATION AND CURETTAGE /HYSTEROSCOPY;  Surgeon: [name], MD;  Location: [facility];  Service: Gynecology;  Laterality: N/A;  . TEE WITHOUT CARDIOVERSION N/A 02/24/2015   Procedure: TRANSESOPHAGEAL ECHOCARDIOGRAM (TEE);  Surgeon: [name], MD;  Location: [facility];  Service: Cardiovascular;  Laterality: N/A;  . [place]  Current Outpatient Medications  Medication Sig Dispense Refill  . albuterol (PROVENTIL HFA;VENTOLIN HFA) 108 (90 BASE) MCG/ACT inhaler Inhale into the lungs every 6 (six) hours as needed for wheezing or shortness of breath. Reported on 11/28/2015    . brimonidine (ALPHAGAN) 0.2 % ophthalmic solution Place 1 drop into the left eye 2 (two) times daily. Reported on 03/06/2016    . Calcium Carbonate-Vit D-Min (CALTRATE 600+D PLUS PO) Take 1 tablet by mouth daily.    Marland Kitchen CARTIA XT 180 MG 24 hr capsule TAKE 1 CAPSULE BY MOUTH ONCE DAILY 90 capsule 2  . Cholecalciferol (VITAMIN D PO) Take 2,000 Units by mouth daily.     . Coenzyme Q10 (COQ-10) 100 MG CAPS Take 1 tablet by mouth daily.    .  Cranberry 500 MG CAPS Take 500 mg by mouth daily.     Marland Kitchen diltiazem (CARDIZEM) 30 MG tablet Cardizem 30mg  -- take 1 tablet by mouth every 4 hours AS NEEDED for heart rate >100 as long as blood pressure >100.    Marland Kitchen ELIQUIS 5 MG TABS tablet TAKE ONE TABLET BY MOUTH TWICE DAILY 180 tablet 3  . furosemide (LASIX) 20 MG tablet Take 1 tablet (20 mg total) by mouth daily as needed. Take daily as needed for swelling 90 tablet 3  . loratadine (CLARITIN) 10 MG tablet Take 10 mg by mouth daily as needed for allergies. Reported on 09/20/2015    . LUMIGAN 0.01 % SOLN Place 1 drop into both eyes at bedtime.    . Magnesium 250 MG TABS Take 1 tablet (250 mg total) by mouth daily.  0  . Multiple Vitamin (MULTIVITAMIN) tablet Take 1 tablet by mouth daily.     . Omega-3 Fatty Acids (FISH OIL) 1200 MG CAPS Take 1 capsule by mouth daily.    Marland Kitchen SOTALOL AF 80 MG TABS Take 1 tablet (80 mg total) by mouth 2 (two) times daily. 180 tablet 2   No current facility-administered medications for this visit.     Allergies:   Metoprolol; Vesicare [solifenacin]; and Reglan [metoclopramide]   Social History:  The patient  reports that she quit smoking about 51 years ago. Her smoking use included cigarettes. She started smoking about 55 years ago. She has a 2.00 pack-year smoking history. She has never used smokeless tobacco. She reports that she does not drink alcohol or use drugs.   Family History:  The patient's family history includes Cancer in her father, sister, sister, and sister; Diabetes in her daughter; Early death in her brother; Heart disease in her mother; Stroke in her father.  ROS:  Please see the history of present illness.  All other systems are reviewed and otherwise negative.   PHYSICAL EXAM: VS:  BP 124/88   Pulse (!) 50   Ht 5\' 3"  (1.6 m)   Wt 203 lb (92.1 kg)   BMI 35.96 kg/m  BMI: Body mass index is 35.96 kg/m. Well nourished, well developed, in no acute distress  HEENT: normocephalic, atraumatic  Neck:  no JVD, carotid bruits or masses Cardiac:  iRRR; no significant murmurs, no rubs, or gallops Lungs:  CTA b/l, no wheezing, rhonchi or rales  Abd: soft, nontender MS: no deformity or atrophy Ext:  no edema  Skin: warm and dry, no rash Neuro:  No gross deficits appreciated Psych: euthymic mood, full affect   ILR site is stable, no tethering or discomfort   EKG:  Done today and reviewed by myself is Afib 87bpm, QTc 476ms ILR  interrogation done today and reviewed by myself: battery is good, R waves 0.29mv, AFib burden is 32.4%, she is in rate controlled AFib today unable to tell duration)   02/24/15: TEE Left ventricle:  LVEF is normal. ------------------------------------------------------------------- Aortic valve:  AV is normal NO AI> ------------------------------------------------------------------- Mitral valve:  MV is normal Mild MR. ------------------------------------------------------------------- Left atrium:   No evidence of thrombus in the atrial cavity or appendage.  No evidence of thrombus in the atrial cavity or appendage. ------------------------------------------------------------------ Atrial septum:  No obvious PFO by color doppler ------------------------------------------------------------------- Pulmonic valve:   PV is normal. ------------------------------------------------------------------- Tricuspid valve:  TV is normal Mild TR.     Recent Labs: 06/17/2017: BUN 10; Creatinine, Ser 0.74; Hemoglobin 14.3; Magnesium 2.2; Platelets 271; Potassium 4.4; Sodium 140  No results found for requested labs within last 8760 hours.   CrCl cannot be calculated (Patient's most recent lab result is older than the maximum 21 days allowed.).   Wt Readings from Last 3 Encounters:  12/11/17 203 lb (92.1 kg)  06/20/17 193 lb 8 oz (87.8 kg)  06/18/17 192 lb (87.1 kg)     Other studies reviewed: Additional studies/records reviewed today include: summarized  above  ASSESSMENT AND PLAN:  1. Paroxysmal AFib/flutter     CHA2DS2Vasc is 3, on Eliquis, appropriately dosed     On Sotalol     Qt stable, she has labs planned for tomorrow, sent her with an Rx to include BMET and mag level      Burden is up some 28.9% when last saw Dr. [name], 32.4% by today's interrogation.  Lengthy discussion with the patient today.  She is feeling well and is very resistant to consideration of any changes.  Repeat ablation is not an option, she does not want to consider another procedure.  She has some AF rates that are faster as well, discussed potential implications of this, she mentions she walks almost daily for exercise, intermittently limited with sciatica pain only, and feels like her exertional capacity is very good. (elevated rates ? With exercise)  2. Hx of post termination pauses     Patient has not wanted to pursue pacing     One noted at last remote, none since  3. HTN     Looks OK, no changes  4. ILR     Interrogated as noted above   Disposition: For now, the patient again would not like to make any changes, she feels well, will again F/u in 6 months, sooner if needed.  Current medicines are reviewed at length with the patient today.  The patient did not have any concerns regarding medicines.  Venetia Night, PA-C 12/11/2017 10:09 AM     [facility] [place] [address] [phone] (office)  [phone] (fax)

## 2017-12-11 ENCOUNTER — Ambulatory Visit (INDEPENDENT_AMBULATORY_CARE_PROVIDER_SITE_OTHER): Payer: Medicare Other | Admitting: Physician Assistant

## 2017-12-11 ENCOUNTER — Other Ambulatory Visit (HOSPITAL_COMMUNITY): Payer: Self-pay | Admitting: *Deleted

## 2017-12-11 VITALS — BP 124/88 | HR 50 | Ht 63.0 in | Wt 203.0 lb

## 2017-12-11 DIAGNOSIS — Z4509 Encounter for adjustment and management of other cardiac device: Secondary | ICD-10-CM

## 2017-12-11 DIAGNOSIS — I480 Paroxysmal atrial fibrillation: Secondary | ICD-10-CM

## 2017-12-11 DIAGNOSIS — I10 Essential (primary) hypertension: Secondary | ICD-10-CM

## 2017-12-11 DIAGNOSIS — Z853 Personal history of malignant neoplasm of breast: Secondary | ICD-10-CM

## 2017-12-11 NOTE — Patient Instructions (Addendum)
Medication Instructions:   Your physician recommends that you continue on your current medications as directed. Please refer to the Current Medication list given to you today.   If you need a refill on your cardiac medications before your next appointment, please call your pharmacy.  Labwork: NONE ORDERED  TODAY   Testing/Procedures: NONE ORDERED  TODAY    Follow-Up:  Your physician wants you to follow-up in:  [place] will receive a reminder letter in the mail two months in advance. If you don't receive a letter, please call our office to schedule the follow-up appointment.   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 This monitoring reduces the number of office visits required to check your device to one time per year. It allows [place] to keep an eye on the functioning of your device to ensure it is working properly. You are scheduled for a device check from home on .4 -3-19 You may send your transmission at any time that day. If you have a wireless device, the transmission will be sent automatically. After your physician reviews your transmission, you will receive a postcard with your next transmission date.      Any Other Special Instructions Will Be Listed Below (If Applicable).

## 2017-12-12 ENCOUNTER — Inpatient Hospital Stay (HOSPITAL_COMMUNITY): Payer: Medicare Other | Attending: Internal Medicine

## 2017-12-12 DIAGNOSIS — C50911 Malignant neoplasm of unspecified site of right female breast: Secondary | ICD-10-CM | POA: Diagnosis not present

## 2017-12-12 DIAGNOSIS — Z853 Personal history of malignant neoplasm of breast: Secondary | ICD-10-CM

## 2017-12-12 LAB — CBC WITH DIFFERENTIAL/PLATELET
BASOS ABS: 0 10*3/uL (ref 0.0–0.1)
Basophils Relative: 1 %
EOS ABS: 0.2 10*3/uL (ref 0.0–0.7)
Eosinophils Relative: 2 %
HCT: 41.9 % (ref 36.0–46.0)
Hemoglobin: 13.9 g/dL (ref 12.0–15.0)
Lymphocytes Relative: 38 %
Lymphs Abs: 3.3 10*3/uL (ref 0.7–4.0)
MCH: 30.2 pg (ref 26.0–34.0)
MCHC: 33.2 g/dL (ref 30.0–36.0)
MCV: 90.9 fL (ref 78.0–100.0)
Monocytes Absolute: 1 10*3/uL (ref 0.1–1.0)
Monocytes Relative: 11 %
Neutro Abs: 4.2 10*3/uL (ref 1.7–7.7)
Neutrophils Relative %: 48 %
PLATELETS: 235 10*3/uL (ref 150–400)
RBC: 4.61 MIL/uL (ref 3.87–5.11)
RDW: 12.6 % (ref 11.5–15.5)
WBC: 8.6 10*3/uL (ref 4.0–10.5)

## 2017-12-12 LAB — COMPREHENSIVE METABOLIC PANEL
ALT: 30 U/L (ref 14–54)
AST: 28 U/L (ref 15–41)
Albumin: 3.9 g/dL (ref 3.5–5.0)
Alkaline Phosphatase: 78 U/L (ref 38–126)
Anion gap: 11 (ref 5–15)
BUN: 13 mg/dL (ref 6–20)
CHLORIDE: 103 mmol/L (ref 101–111)
CO2: 21 mmol/L — AB (ref 22–32)
CREATININE: 0.78 mg/dL (ref 0.44–1.00)
Calcium: 9 mg/dL (ref 8.9–10.3)
GFR calc Af Amer: >60 mL/min (ref >60)
GFR calc non Af Amer: >60 mL/min (ref >60)
Glucose, Bld: 110 mg/dL — ABNORMAL HIGH (ref 65–99)
Potassium: 4.4 mmol/L (ref 3.5–5.1)
SODIUM: 135 mmol/L (ref 135–145)
Total Bilirubin: 0.7 mg/dL (ref 0.3–1.2)
Total Protein: 7 g/dL (ref 6.5–8.1)

## 2017-12-12 LAB — MAGNESIUM: Magnesium: 1.8 mg/dL (ref 1.7–2.4)

## 2017-12-18 ENCOUNTER — Ambulatory Visit (INDEPENDENT_AMBULATORY_CARE_PROVIDER_SITE_OTHER): Payer: Medicare Other | Admitting: *Deleted

## 2017-12-18 DIAGNOSIS — I480 Paroxysmal atrial fibrillation: Secondary | ICD-10-CM | POA: Diagnosis not present

## 2017-12-19 ENCOUNTER — Inpatient Hospital Stay (HOSPITAL_COMMUNITY): Payer: Medicare Other | Attending: Internal Medicine | Admitting: Internal Medicine

## 2017-12-19 ENCOUNTER — Other Ambulatory Visit: Payer: Self-pay

## 2017-12-19 ENCOUNTER — Encounter (HOSPITAL_COMMUNITY): Payer: Self-pay | Admitting: Internal Medicine

## 2017-12-19 VITALS — BP 129/61 | HR 57 | Temp 97.7°F | Resp 16 | Ht 63.0 in | Wt 204.0 lb

## 2017-12-19 DIAGNOSIS — I4891 Unspecified atrial fibrillation: Secondary | ICD-10-CM | POA: Insufficient documentation

## 2017-12-19 DIAGNOSIS — M2550 Pain in unspecified joint: Secondary | ICD-10-CM | POA: Insufficient documentation

## 2017-12-19 DIAGNOSIS — Z87891 Personal history of nicotine dependence: Secondary | ICD-10-CM | POA: Diagnosis not present

## 2017-12-19 DIAGNOSIS — N951 Menopausal and female climacteric states: Secondary | ICD-10-CM

## 2017-12-19 DIAGNOSIS — Z853 Personal history of malignant neoplasm of breast: Secondary | ICD-10-CM | POA: Insufficient documentation

## 2017-12-19 DIAGNOSIS — M8580 Other specified disorders of bone density and structure, unspecified site: Secondary | ICD-10-CM | POA: Insufficient documentation

## 2017-12-19 NOTE — Progress Notes (Signed)
Carelink Summary Report / Loop Recorder 

## 2017-12-19 NOTE — Patient Instructions (Addendum)
[facility] at [facility] Discharge Instructions   You were seen today by Dr. Zoila Shutter. She discussed you restarting the Femara and she also offered you a bone scan. Restart the Femara. Call if your joint pain gets worse. Mammogram in September. We will see you back in September for labs and follow up.   Thank you for choosing [place] at [facility] to provide your oncology and hematology care.  To afford each patient quality time with our provider, please arrive at least 15 minutes before your scheduled appointment time.    If you have a lab appointment with the [facility] please come in thru the  Main Entrance and check in at the main information desk  You need to re-schedule your appointment should you arrive 10 or more minutes late.  We strive to give you quality time with our providers, and arriving late affects you and other patients whose appointments are after yours.  Also, if you no show three or more times for appointments you may be dismissed from the clinic at the providers discretion.     Again, thank you for choosing [facility].  Our hope is that these requests will decrease the amount of time that you wait before being seen by our physicians.       _____________________________________________________________  Should you have questions after your visit to [facility], please contact our office at [phone] between the hours of 8:30 a.m. and 4:30 p.m.  Voicemails left after 4:30 p.m. will not be returned until the following business day.  For prescription refill requests, have your pharmacy contact our office.       Resources For Cancer Patients and their Caregivers ? American Cancer Society: Can assist with transportation, wigs, general needs, runs Look Good Feel Better.        214-197-5715 ? Cancer Care: Provides financial assistance, online support groups, medication/co-pay  assistance.  1-800-813-HOPE 5631729067) ? [place] Assists [place] cancer patients and their families through emotional , educational and financial support.  8253119164 ? [place] DSS Where to apply for food stamps, Medicaid and utility assistance. 587 369 4478 ? [facility]: Transportation to medical appointments. 8653799554 ? Social Security Administration: May apply for disability if have a Stage IV cancer. (657) 159-5531 937-655-0483 ? [employer], Disability and Transit Services: Assists with nutrition, care and transit needs. [place] Support Programs:   > Cancer Support Group  2nd Tuesday of the month 1pm-2pm, Journey Room   > Creative Journey  3rd Tuesday of the month 1130am-1pm, Journey Room

## 2017-12-19 NOTE — Progress Notes (Signed)
Diagnosis History of breast cancer, bilateral: Right T1N0 Lumpectomy July 2010, Left T1c, N0 Lumpectomy 04/26/2010. - Plan: MM DIAG BREAST TOMO BILATERAL  Staging Cancer Staging No matching staging information was found for the patient.  CURRENT THERAPY: femara   Assessment and Plan:  1.  ER+ bilateral breast cancer.  Pt reports an intolerance to Femara and stopped the medication in 07/2017.  In the past she has reported an intolerance to '20mg'$  Tamoxifen secondary to hot flashes and also reports uterine enlargement and abnormal endometrial lining, with hysteroscopy and D&C without significant pathology.  She also reports an intolerance to aromasin secondary to hot flashes, intolerance to Arimidex due to joint pain.  Pt had BCI prognostic index checked; high risk category, BCI predictive high likelihood of benefit from extended endocrine RX  Long talk held with the patient today.  I discussed with her that she has been tried on multiple agents.  She was indicating she had very little side effects with tamoxifen.  I discussed with her that due to the uterine abnormalities that were noted while she was on the medication, tamoxifen would not currently be recommended.  I also discussed with her studies have shown breast cancer patients risk for recurrence is significantly lower with the aromatase inhibitors.  She was given option of bone scan due to ongoing complaints of joint pain especially in light of her history of bilateral breast cancer.  She does not desire to have that performed.  She reports she is willing to resume Femara which she began in 2017 as directed and is planned for 10 years of therapy.  She is due for bilateral diagnostic mammogram in September 2019 and this is ordered today and she will follow-up in September 2019 to go over the results.   2.   Osteopenia.  Patient is on calcium and vitamin D.  She had previously been given option of Prolia but did not desire to take the medication.   Bone density that was done on 11/27/2017 showed osteopenia.  She will have repeat bone density in March 2021  3.  Joint pain.  She was given the option of bone scan for further evaluation due to her ongoing complaints.  She desires to wait on that evaluation but will notify the office if symptoms significantly worsen when she resumes Femara.  4.  Atrial fibrillation.  Magnesium level that was done today is 1.8 which is within normal limits.  She is currently on Eliquis.  She should continue to follow-up with cardiology as recommended.  5.  Dental issues.  Continue to follow-up with dentistry as recommended.  Interval history: Diagnosis #1 invasive ductal carcinoma the left breast 1.7 cm in size with LV I. but for negative sentinel nodes. Estrogen receptor +95% progesterone receptor +96% Ki-67 marker low at 9% HER-2/neu not amplified status post lumpectomy followed by radiation therapy. Her surgery was 04/26/2010 and she started tamoxifen on 08/16/2010 after radiation therapy finished on 07/21/2010. She is having tremendous sweating and hot flashes .   Tamoxifen 10 mg daily started by Dr. 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 Estrogen receptors were 96% positive, progesterone  33% positive Ki-67 marker low 8% HER-2/neu negative and she did not receive any hormonal therapy after the radiation on the right side.  Breast Cancer Index with high likelihood of benefit from extended endocrine  therapy. BCI prognostic high risk category.  Pt has ntolerance to '20mg'$  Tamoxifen secondary to hot flashes and also reports uterus enlarged while on medication Intolerance to aromasin secondary to hot flashes Intolerance to Arimidex due to joint symptoms.   Abnormal endometrial lining, with hysteroscopy and D&C without significant  pathology BCI prognostic; high risk category, BCI predictive high likelihood of benefit from extended endocrine RX  Current Status: Patient is seen today for follow-up.  She reports she stopped taking Femara August 16, 2017.  She reportedly has been on Femara for 3 years.  She reports joint discomfort is why she discontinued the medication.  She is here today to go over labs and bone density.  She reports she will not take Prolia injections.  Problem List Patient Active Problem List   Diagnosis Date Noted  . Atrial flutter ([place]) [I48.92] 01/31/2015  . Osteopenia [M85.80] 01/24/2015  . Atypical glandular cells of undetermined significance (AGUS) on cervical Pap smear [R87.619] 11/23/2014  . Thickened endometrium [R93.89] 11/23/2014  . Snoring [R06.83] 09/20/2014  . A-fib ([place]) [I48.91] 06/18/2014  . Pain in joint, ankle and foot [M25.579] 10/16/2011  . History of breast cancer, bilateral: Right T1N0 Lumpectomy July 2010, Left T1c, N0 Lumpectomy 04/26/2010. [Z85.3] 05/04/2011    Past Medical History Past Medical History:  Diagnosis Date  . Breast cancer ([place])   . Cancer of breast [facility]) July 2010   s/p XRT 2010 and again in 2011  . Chronic coughing   . Cramps, muscle, general    severe breast cramping   . Dysrhythmia    atrial flutter  . Glaucoma   . Glaucoma    left eye   . Joint pain    foot and knee pain  . Lymphedema of arm    left  . Night sweats   . Osteopenia 01/24/2015  . Pain    breast pain  . Paroxysmal atrial fibrillation ([place]) 05/2014  . Sinus problem   . Wears glasses     Past Surgical History Past Surgical History:  Procedure Laterality Date  . BREAST SURGERY  2010- right and 2011-left   for cancer  . CARDIOVERSION N/A 10/21/2014   Procedure: CARDIOVERSION;  Surgeon: [name], MD;  Location: [place];  Service: Cardiovascular;  Laterality: N/A;  . COLONOSCOPY N/A 02/05/2013   Procedure: COLONOSCOPY;  Surgeon: [name], MD;  [facility];  Service: Endoscopy;  Laterality: N/A;  830  . ELECTROPHYSIOLOGIC STUDY N/A 02/24/2015   PVI and CTI ablation by Dr [name]  . EP IMPLANTABLE DEVICE N/A 08/24/2015   Procedure: Loop Recorder Insertion;  Surgeon: [place] Grayer, MD;  Location: [place] CV LAB;  Service: Cardiovascular;  Laterality: N/A;  . HYSTEROSCOPY W/D&C N/A 12/29/2014   Procedure: DILATATION AND CURETTAGE /HYSTEROSCOPY;  Surgeon: [name], MD;  [facility]: [facility] ORS;  Service: Gynecology;  Laterality: N/A;  . TEE WITHOUT CARDIOVERSION N/A 02/24/2015   Procedure: TRANSESOPHAGEAL ECHOCARDIOGRAM (TEE);  Surgeon: [name], MD;  Location: [facility] ENDOSCOPY;  Service: Cardiovascular;  Laterality: N/A;  . TUBAL LIGATION  1984    Family History Family History  Problem Relation Age of Onset  . Heart disease Mother   . Stroke Father   . Cancer Father   . Early death Brother   . Cancer Sister   . Cancer Sister   . Cancer Sister   . Diabetes Daughter      Social History  reports that she quit smoking about 51 years ago.  Her smoking use included cigarettes. She started smoking about 55 years ago. She has a 2.00 pack-year smoking history. She has never used smokeless tobacco. She reports that she does not drink alcohol or use drugs.  Medications  Current Outpatient Medications:  .  albuterol (PROVENTIL HFA;VENTOLIN HFA) 108 (90 BASE) MCG/ACT inhaler, Inhale into the lungs every 6 (six) hours as needed for wheezing or shortness of breath. Reported on 11/28/2015, Disp: , Rfl:  .  brimonidine (ALPHAGAN) 0.2 % ophthalmic solution, Place 1 drop into the left eye 2 (two) times daily. Reported on 03/06/2016, Disp: , Rfl:  .  Calcium Carbonate-Vit D-Min (CALTRATE 600+D PLUS PO), Take 1 tablet by mouth daily., Disp: , Rfl:  .  CARTIA XT 180 MG 24 hr capsule, TAKE 1 CAPSULE BY MOUTH ONCE DAILY, Disp: 90 capsule, Rfl: 2 .  Cholecalciferol (VITAMIN D PO), Take 2,000 Units by mouth daily. , Disp: , Rfl:  .  Coenzyme Q10 (COQ-10) 100  MG CAPS, Take 1 tablet by mouth daily., Disp: , Rfl:  .  Cranberry 500 MG CAPS, Take 500 mg by mouth daily. , Disp: , Rfl:  .  diltiazem (CARDIZEM) 30 MG tablet, Cardizem '30mg'$  -- take 1 tablet by mouth every 4 hours AS NEEDED for heart rate >100 as long as blood pressure >100., Disp: , Rfl:  .  ELIQUIS 5 MG TABS tablet, TAKE ONE TABLET BY MOUTH TWICE DAILY, Disp: 180 tablet, Rfl: 3 .  furosemide (LASIX) 20 MG tablet, Take 1 tablet (20 mg total) by mouth daily as needed. Take daily as needed for swelling, Disp: 90 tablet, Rfl: 3 .  loratadine (CLARITIN) 10 MG tablet, Take 10 mg by mouth daily as needed for allergies. Reported on 09/20/2015, Disp: , Rfl:  .  LUMIGAN 0.01 % SOLN, Place 1 drop into both eyes at bedtime., Disp: , Rfl:  .  Magnesium 250 MG TABS, Take 1 tablet (250 mg total) by mouth daily., Disp: , Rfl: 0 .  Multiple Vitamin (MULTIVITAMIN) tablet, Take 1 tablet by mouth daily. , Disp: , Rfl:  .  Omega-3 Fatty Acids (FISH OIL) 1200 MG CAPS, Take 1 capsule by mouth daily., Disp: , Rfl:  .  SOTALOL AF 80 MG TABS, Take 1 tablet (80 mg total) by mouth 2 (two) times daily., Disp: 180 tablet, Rfl: 2  Allergies Metoprolol; Vesicare [solifenacin]; and Reglan [metoclopramide]  Review of Systems Review of Systems - Oncology ROS as per HPI otherwise 12 point ROS is negative.   Physical Exam  Vitals Wt Readings from Last 3 Encounters:  12/19/17 204 lb (92.5 kg)  12/11/17 203 lb (92.1 kg)  06/20/17 193 lb 8 oz (87.8 kg)   Temp Readings from Last 3 Encounters:  12/19/17 97.7 F (36.5 C) (Oral)  11/23/16 97.7 F (36.5 C) (Oral)  05/24/16 97.7 F (36.5 C) (Oral)   BP Readings from Last 3 Encounters:  12/19/17 129/61  12/11/17 124/88  06/20/17 (!) 145/67   Pulse Readings from Last 3 Encounters:  12/19/17 (!) 57  12/11/17 (!) 50  06/20/17 (!) 55   Constitutional: Well-developed, well-nourished, and in no distress.   HENT: Head: Normocephalic and atraumatic.  Mouth/Throat: No  oropharyngeal exudate. Mucosa moist. Eyes: Pupils are equal, round, and reactive to light. Conjunctivae are normal. No scleral icterus.  Neck: Normal range of motion. Neck supple. No JVD present.  Cardiovascular: Normal rate, regular rhythm and normal heart sounds.  Exam reveals no gallop and no friction rub.   No murmur heard.  Pulmonary/Chest: Effort normal and breath sounds normal. No respiratory distress. No wheezes.No rales.  Abdominal: Soft. Bowel sounds are normal. No distension. There is no tenderness. There is no guarding.  Musculoskeletal: No edema or tenderness.  Lymphadenopathy: No cervical, axillary or supraclavicular adenopathy.  Neurological: Alert and oriented to person, place, and time. No cranial nerve deficit.  Skin: Skin is warm and dry. No rash noted. No erythema. No pallor.  Psychiatric: Affect and judgment normal.  Bilateral breast exam: Chaperone present.  She shows evidence of lumpectomy scars  noted bilaterally.  No dominant masses are noted bilaterally.  Labs No visits with results within 3 Day(s) from this visit.  Latest known visit with results is:  Appointment on 12/12/2017  Component Date Value Ref Range Status  . WBC 12/12/2017 8.6  4.0 - 10.5 K/uL Final  . RBC 12/12/2017 4.61  3.87 - 5.11 MIL/uL Final  . Hemoglobin 12/12/2017 13.9  12.0 - 15.0 g/dL Final  . HCT 12/12/2017 41.9  36.0 - 46.0 % Final  . MCV 12/12/2017 90.9  78.0 - 100.0 fL Final  . MCH 12/12/2017 30.2  26.0 - 34.0 pg Final  . MCHC 12/12/2017 33.2  30.0 - 36.0 g/dL Final  . RDW 12/12/2017 12.6  11.5 - 15.5 % Final  . Platelets 12/12/2017 235  150 - 400 K/uL Final  . Neutrophils Relative % 12/12/2017 48  % Final  . Neutro Abs 12/12/2017 4.2  1.7 - 7.7 K/uL Final  . Lymphocytes Relative 12/12/2017 38  % Final  . Lymphs Abs 12/12/2017 3.3  0.7 - 4.0 K/uL Final  . Monocytes Relative 12/12/2017 11  % Final  . Monocytes Absolute 12/12/2017 1.0  0.1 - 1.0 K/uL Final  . Eosinophils Relative  12/12/2017 2  % Final  . Eosinophils Absolute 12/12/2017 0.2  0.0 - 0.7 K/uL Final  . Basophils Relative 12/12/2017 1  % Final  . Basophils Absolute 12/12/2017 0.0  0.0 - 0.1 K/uL Final   Performed at [facility], [address], [place], [place] [address]  . Sodium 12/12/2017 135  135 - 145 mmol/L Final  . Potassium 12/12/2017 4.4  3.5 - 5.1 mmol/L Final  . Chloride 12/12/2017 103  101 - 111 mmol/L Final  . CO2 12/12/2017 21* 22 - 32 mmol/L Final  . Glucose, Bld 12/12/2017 110* 65 - 99 mg/dL Final  . BUN 12/12/2017 13  6 - 20 mg/dL Final  . Creatinine, Ser 12/12/2017 0.78  0.44 - 1.00 mg/dL Final  . Calcium 12/12/2017 9.0  8.9 - 10.3 mg/dL Final  . Total Protein 12/12/2017 7.0  6.5 - 8.1 g/dL Final  . Albumin 12/12/2017 3.9  3.5 - 5.0 g/dL Final  . AST 12/12/2017 28  15 - 41 U/L Final  . ALT 12/12/2017 30  14 - 54 U/L Final  . Alkaline Phosphatase 12/12/2017 78  38 - 126 U/L Final  . Total Bilirubin 12/12/2017 0.7  0.3 - 1.2 mg/dL Final  . GFR calc non Af Amer 12/12/2017 >60  >60 mL/min Final  . GFR calc Af Amer 12/12/2017 >60  >60 mL/min Final   Comment: (NOTE) The eGFR has been calculated using the CKD EPI equation. This calculation has not been validated in all clinical situations. eGFR's persistently <60 mL/min signify possible Chronic Kidney Disease.   Georgiann Hahn gap 12/12/2017 11  5 - 15 Final   Performed at [facility], [address], [place], [place] [address]  . Magnesium 12/12/2017 1.8  1.7 - 2.4 mg/dL  Final   Performed at [facility], [address], [place], [place] [address]     Pathology Orders Placed This Encounter  Procedures  . MM DIAG BREAST TOMO BILATERAL    Standing Status:   Future    Standing Expiration Date:   12/20/2018    Order Specific Question:   Reason for Exam (SYMPTOM  OR DIAGNOSIS REQUIRED)    Answer:   bilateral breast cancer    Order Specific Question:   Preferred imaging location?    Answer:   [facility]       Zoila Shutter MD

## 2017-12-23 LAB — CUP PACEART REMOTE DEVICE CHECK
Date Time Interrogation Session: 20190301204014
MDC IDC PG IMPLANT DT: 20161207

## 2018-01-20 ENCOUNTER — Other Ambulatory Visit (HOSPITAL_COMMUNITY): Payer: Self-pay | Admitting: Nurse Practitioner

## 2018-01-20 ENCOUNTER — Ambulatory Visit (INDEPENDENT_AMBULATORY_CARE_PROVIDER_SITE_OTHER): Payer: Medicare Other | Admitting: *Deleted

## 2018-01-20 DIAGNOSIS — I480 Paroxysmal atrial fibrillation: Secondary | ICD-10-CM | POA: Diagnosis not present

## 2018-01-21 NOTE — Progress Notes (Signed)
Carelink Summary Report / Loop Recorder 

## 2018-01-22 LAB — CUP PACEART REMOTE DEVICE CHECK
Date Time Interrogation Session: 20190403213951
MDC IDC PG IMPLANT DT: 20161207

## 2018-02-11 LAB — CUP PACEART REMOTE DEVICE CHECK
Date Time Interrogation Session: 20190506220741
Implantable Pulse Generator Implant Date: 20161207

## 2018-02-18 ENCOUNTER — Other Ambulatory Visit (HOSPITAL_COMMUNITY): Payer: Self-pay | Admitting: Nurse Practitioner

## 2018-02-24 ENCOUNTER — Ambulatory Visit (INDEPENDENT_AMBULATORY_CARE_PROVIDER_SITE_OTHER): Payer: Medicare Other | Admitting: *Deleted

## 2018-02-24 DIAGNOSIS — I480 Paroxysmal atrial fibrillation: Secondary | ICD-10-CM | POA: Diagnosis not present

## 2018-02-24 NOTE — Progress Notes (Signed)
Carelink Summary Report / Loop Recorder 

## 2018-03-18 ENCOUNTER — Other Ambulatory Visit (HOSPITAL_COMMUNITY): Payer: Self-pay | Admitting: *Deleted

## 2018-03-18 MED ORDER — LETROZOLE 2.5 MG PO TABS: 2.5000 mg | ORAL_TABLET | Freq: Every day | ORAL | 3 refills | Status: DC

## 2018-03-24 DIAGNOSIS — H401121 Primary open-angle glaucoma, left eye, mild stage: Secondary | ICD-10-CM | POA: Diagnosis not present

## 2018-03-27 ENCOUNTER — Ambulatory Visit (INDEPENDENT_AMBULATORY_CARE_PROVIDER_SITE_OTHER): Payer: Medicare Other | Admitting: *Deleted

## 2018-03-27 DIAGNOSIS — I480 Paroxysmal atrial fibrillation: Secondary | ICD-10-CM | POA: Diagnosis not present

## 2018-03-28 NOTE — Progress Notes (Signed)
Carelink Summary Report / Loop Recorder 

## 2018-04-01 LAB — CUP PACEART REMOTE DEVICE CHECK
Implantable Pulse Generator Implant Date: 20161207
MDC IDC SESS DTM: 20190608224103

## 2018-04-29 ENCOUNTER — Ambulatory Visit (INDEPENDENT_AMBULATORY_CARE_PROVIDER_SITE_OTHER): Payer: Medicare Other | Admitting: *Deleted

## 2018-04-29 DIAGNOSIS — I480 Paroxysmal atrial fibrillation: Secondary | ICD-10-CM | POA: Diagnosis not present

## 2018-04-30 NOTE — Progress Notes (Signed)
Carelink Summary Report / Loop Recorder 

## 2018-05-08 LAB — CUP PACEART REMOTE DEVICE CHECK
Date Time Interrogation Session: 20190711223905
Implantable Pulse Generator Implant Date: 20161207

## 2018-05-13 ENCOUNTER — Encounter: Payer: Self-pay | Admitting: Physician Assistant

## 2018-05-15 ENCOUNTER — Other Ambulatory Visit (HOSPITAL_COMMUNITY): Payer: Self-pay

## 2018-05-15 DIAGNOSIS — Z853 Personal history of malignant neoplasm of breast: Secondary | ICD-10-CM

## 2018-05-20 ENCOUNTER — Inpatient Hospital Stay (HOSPITAL_COMMUNITY): Payer: Medicare Other | Attending: Hematology

## 2018-05-20 DIAGNOSIS — C50911 Malignant neoplasm of unspecified site of right female breast: Secondary | ICD-10-CM | POA: Diagnosis not present

## 2018-05-20 DIAGNOSIS — I4891 Unspecified atrial fibrillation: Secondary | ICD-10-CM | POA: Insufficient documentation

## 2018-05-20 DIAGNOSIS — C50912 Malignant neoplasm of unspecified site of left female breast: Secondary | ICD-10-CM | POA: Diagnosis not present

## 2018-05-20 DIAGNOSIS — Z79811 Long term (current) use of aromatase inhibitors: Secondary | ICD-10-CM | POA: Insufficient documentation

## 2018-05-20 DIAGNOSIS — R252 Cramp and spasm: Secondary | ICD-10-CM | POA: Diagnosis not present

## 2018-05-20 DIAGNOSIS — Z853 Personal history of malignant neoplasm of breast: Secondary | ICD-10-CM

## 2018-05-20 DIAGNOSIS — Z7901 Long term (current) use of anticoagulants: Secondary | ICD-10-CM | POA: Insufficient documentation

## 2018-05-20 DIAGNOSIS — G8929 Other chronic pain: Secondary | ICD-10-CM | POA: Insufficient documentation

## 2018-05-20 DIAGNOSIS — M8580 Other specified disorders of bone density and structure, unspecified site: Secondary | ICD-10-CM | POA: Diagnosis not present

## 2018-05-20 DIAGNOSIS — Z170 Estrogen receptor positive status [ER+]: Secondary | ICD-10-CM | POA: Insufficient documentation

## 2018-05-20 DIAGNOSIS — M549 Dorsalgia, unspecified: Secondary | ICD-10-CM | POA: Diagnosis not present

## 2018-05-20 LAB — CBC WITH DIFFERENTIAL/PLATELET
BASOS ABS: 0.1 10*3/uL (ref 0.0–0.1)
BASOS PCT: 1 %
Eosinophils Absolute: 0.2 10*3/uL (ref 0.0–0.7)
Eosinophils Relative: 2 %
HEMATOCRIT: 42.5 % (ref 36.0–46.0)
Hemoglobin: 14.8 g/dL (ref 12.0–15.0)
Lymphocytes Relative: 32 %
Lymphs Abs: 3 10*3/uL (ref 0.7–4.0)
MCH: 31.6 pg (ref 26.0–34.0)
MCHC: 34.8 g/dL (ref 30.0–36.0)
MCV: 90.6 fL (ref 78.0–100.0)
MONO ABS: 1 10*3/uL (ref 0.1–1.0)
Monocytes Relative: 11 %
NEUTROS ABS: 5.3 10*3/uL (ref 1.7–7.7)
Neutrophils Relative %: 54 %
PLATELETS: 226 10*3/uL (ref 150–400)
RBC: 4.69 MIL/uL (ref 3.87–5.11)
RDW: 12.7 % (ref 11.5–15.5)
WBC: 9.5 10*3/uL (ref 4.0–10.5)

## 2018-05-20 LAB — COMPREHENSIVE METABOLIC PANEL
ALT: 29 U/L (ref 0–44)
AST: 25 U/L (ref 15–41)
Albumin: 4.2 g/dL (ref 3.5–5.0)
Alkaline Phosphatase: 70 U/L (ref 38–126)
Anion gap: 10 (ref 5–15)
BUN: 11 mg/dL (ref 8–23)
CHLORIDE: 103 mmol/L (ref 98–111)
CO2: 22 mmol/L (ref 22–32)
CREATININE: 0.75 mg/dL (ref 0.44–1.00)
Calcium: 9.3 mg/dL (ref 8.9–10.3)
GFR calc Af Amer: >60 mL/min (ref >60)
GFR calc non Af Amer: >60 mL/min (ref >60)
Glucose, Bld: 108 mg/dL — ABNORMAL HIGH (ref 70–99)
Potassium: 4.3 mmol/L (ref 3.5–5.1)
Sodium: 135 mmol/L (ref 135–145)
Total Bilirubin: 0.9 mg/dL (ref 0.3–1.2)
Total Protein: 7.3 g/dL (ref 6.5–8.1)

## 2018-05-20 LAB — MAGNESIUM: Magnesium: 1.9 mg/dL (ref 1.7–2.4)

## 2018-05-21 ENCOUNTER — Other Ambulatory Visit (HOSPITAL_COMMUNITY): Payer: Medicare Other

## 2018-05-21 ENCOUNTER — Telehealth: Payer: Self-pay | Admitting: Cardiology

## 2018-05-21 NOTE — Telephone Encounter (Signed)
Spoke w/ pt and requested that she send a manual transmission b/c her home monitor has not updated in at least 14 days.   

## 2018-05-27 NOTE — Progress Notes (Signed)
Cardiology Office Note Date:  06/03/2018  Patient ID:  Heidi Lopez, Heidi Lopez [birth year], MRN [id number] PCP:  [name], MD  Cardiologist:  Dr. Harl Bowie Electrophysiologist: Dr. [name]   Chief Complaint: planned f/u  History of Present Illness: Heidi Lopez is a 72 y.o. female with history of glaucoma, breast ca tx w/surgery and XRT 2010/2011, and PAFib/flutter s/p PVI/CTI ablation 2016 underwent loop implant 08/2015, HTN  She comes in today to be seen for Dr. [name].  She last saw him I Oct 2018, at that time he noted her AF burden was up to 28.9 % from 16%.  He discussed concerns this would continue to rise and suggested up-titration of her sotalol and/or repeat ablation, the patient declined both.  Also noted was hx of post-termination pauses historically though the patient felt symptoms improved and did not want to pursue pacing.  Sotalol/ELiquis labs were done (and looked OK). She was planned for 6 mo f/u.  I saw her in f/u march 2019, she was feeling well.  She was in AF rate 70's and she was unaware/without symptoms.  She denied CP, palpitations or cardiac awareness at all.  She had not had any brady symptoms, no weak spells, no near syncope or syncope.  She reported after the reduction in her diltiazem some months prior she had not had any kind of symptoms.  She stated in the last few months she had felt better then she has in a long time.  She was tolerating her medicines, no bleeding or signs of bleeding, reported compliance with her Eliquis.  Her AF burden was noted to have increased again, to 32.4%, discussed this, options again such as titration of her Sotolol, revisiting with Dr. [name] about repeat ablation, but she was firm, she was feeling well and did not want to consider changes.  She is doing well.  Unfortunately with ups/downs with family stuff and stress.  During these times especially when she checks her HR tends to be faster and assumes she is having AF.  She does not have any  symptoms.  No palpitations, no SOB, no dizziness, no near syncope or syncope.  She walks routinely for exercise and feels like she has good exertional capacity.  She is taking her Eliquis, rarely misses a dose.  She had one episode of bleeding with a BM with hemorrhoid, otherwise no bleeding or signs of bleeding.  Her primary physical symptom is her post nasal gtt/sinus congestion that she has been recently rx flonase for.   Device information: MDT ILR, implanted 08/24/2015, AFib management, Dr. [name]   Past Medical History:  Diagnosis Date  . Breast cancer ([place])   . Cancer of breast [facility]) July 2010   s/p XRT 2010 and again in 2011  . Chronic coughing   . Cramps, muscle, general    severe breast cramping   . Dysrhythmia    atrial flutter  . Glaucoma   . Glaucoma    left eye   . Joint pain    foot and knee pain  . Lymphedema of arm    left  . Night sweats   . Osteopenia 01/24/2015  . Pain    breast pain  . Paroxysmal atrial fibrillation ([place]) 05/2014  . Sinus problem   . Wears glasses     Past Surgical History:  Procedure Laterality Date  . BREAST SURGERY  2010- right and 2011-left   for cancer  . CARDIOVERSION N/A 10/21/2014   Procedure: CARDIOVERSION;  Surgeon:  [name], MD;  Location: [place];  Service: Cardiovascular;  Laterality: N/A;  . COLONOSCOPY N/A 02/05/2013   Procedure: COLONOSCOPY;  Surgeon: [name], MD;  [facility];  Service: Endoscopy;  Laterality: N/A;  830  . ELECTROPHYSIOLOGIC STUDY N/A 02/24/2015   PVI and CTI ablation by Dr [name]  . EP IMPLANTABLE DEVICE N/A 08/24/2015   Procedure: Loop Recorder Insertion;  Surgeon: [place] Grayer, MD;  Location: [place] CV LAB;  Service: Cardiovascular;  Laterality: N/A;  . HYSTEROSCOPY W/D&C N/A 12/29/2014   Procedure: DILATATION AND CURETTAGE /HYSTEROSCOPY;  Surgeon: [name], MD;  Location[facility];  Service: Gynecology;  Laterality: N/A;  . TEE WITHOUT CARDIOVERSION N/A 02/24/2015    Procedure: TRANSESOPHAGEAL ECHOCARDIOGRAM (TEE);  Surgeon: [name], MD;  Location: [facility] ENDOSCOPY;  Service: Cardiovascular;  Laterality: N/A;  . TUBAL LIGATION  1984    Current Outpatient Medications  Medication Sig Dispense Refill  . albuterol (PROVENTIL HFA;VENTOLIN HFA) 108 (90 BASE) MCG/ACT inhaler Inhale into the lungs every 6 (six) hours as needed for wheezing or shortness of breath. Reported on 11/28/2015    . brimonidine (ALPHAGAN) 0.2 % ophthalmic solution Place 1 drop into the left eye 2 (two) times daily. Reported on 03/06/2016    . Calcium Carbonate-Vit D-Min (CALTRATE 600+D PLUS PO) Take 1 tablet by mouth daily.    Marland Kitchen CARTIA XT 180 MG 24 hr capsule TAKE 1 CAPSULE BY MOUTH ONCE DAILY 90 capsule 2  . Cholecalciferol (VITAMIN D PO) Take 2,000 Units by mouth daily.     . Coenzyme Q10 (COQ-10) 100 MG CAPS Take 1 tablet by mouth daily.    . Cranberry 500 MG CAPS Take 500 mg by mouth daily.     Marland Kitchen diltiazem (CARDIZEM) 30 MG tablet Cardizem 30mg  -- take 1 tablet by mouth every 4 hours AS NEEDED for heart rate >100 as long as blood pressure >100.    Marland Kitchen ELIQUIS 5 MG TABS tablet TAKE 1 TABLET BY MOUTH TWICE DAILY 180 tablet 1  . furosemide (LASIX) 20 MG tablet Take 1 tablet (20 mg total) by mouth daily as needed. Take daily as needed for swelling 90 tablet 3  . letrozole (FEMARA) 2.5 MG tablet Take 1 tablet (2.5 mg total) by mouth daily. 90 tablet 3  . loratadine (CLARITIN) 10 MG tablet Take 10 mg by mouth daily as needed for allergies. Reported on 09/20/2015    . LUMIGAN 0.01 % SOLN Place 1 drop into both eyes at bedtime.    . Magnesium 250 MG TABS Take 1 tablet (250 mg total) by mouth daily.  0  . Multiple Vitamin (MULTIVITAMIN) tablet Take 1 tablet by mouth daily.     . Omega-3 Fatty Acids (FISH OIL) 1200 MG CAPS Take 1 capsule by mouth daily.    Marland Kitchen SOTALOL AF 80 MG TABS TAKE 1 TABLET BY MOUTH TWICE DAILY 180 tablet 2   No current facility-administered medications for this visit.      Allergies:   Metoprolol; Vesicare [solifenacin]; and Reglan [metoclopramide]   Social History:  The patient  reports that she quit smoking about 51 years ago. Her smoking use included cigarettes. She started smoking about 55 years ago. She has a 2.00 pack-year smoking history. She has never used smokeless tobacco. She reports that she does not drink alcohol or use drugs.   Family History:  The patient's family history includes Cancer in her father, sister, sister, and sister; Diabetes in her daughter; Early  death in her brother; Heart disease in her mother; Stroke in her father.  ROS:  Please see the history of present illness.  All other systems are reviewed and otherwise negative.   PHYSICAL EXAM: VS:  BP 124/76   Pulse 61   Ht 5\' 3"  (1.6 m)   Wt 198 lb (89.8 kg)   BMI 35.07 kg/m  BMI: Body mass index is 35.07 kg/m. Well nourished, well developed, in no acute distress  HEENT: normocephalic, atraumatic  Neck: no JVD, carotid bruits or masses Cardiac:  RRR; no significant murmurs, no rubs, or gallops Lungs:  CTA b/l, no wheezing, rhonchi or rales  Abd: soft, nontender MS: no deformity oratrophy Ext:  no edema  Skin: warm and dry, no rash Neuro:  No gross deficits appreciated Psych: euthymic mood, full affect   ILR site is stable, no tethering or discomfort   EKG:  Done today and reviewed by myself is SR 61bpm, PR 128ms, QRS 163ms, QT 410ms, QTc 445ms 12/11/17 Afib 87bpm, QTc 465ms ILR interrogation done today and reviewed by myself: battery is good, 32% AF burden, one tachy episode minute duration, looks rapid AF/irregular, R waves 0.26  12/11/17 noted her AFib burden is 32.4%   02/24/15: TEE Left ventricle:  LVEF is normal. ------------------------------------------------------------------- Aortic valve:  AV is normal NO AI> ------------------------------------------------------------------- Mitral valve:  MV is normal Mild  MR. ------------------------------------------------------------------- Left atrium:   No evidence of thrombus in the atrial cavity or appendage.  No evidence of thrombus in the atrial cavity or appendage. ------------------------------------------------------------------ Atrial septum:  No obvious PFO by color doppler ------------------------------------------------------------------- Pulmonic valve:   PV is normal. ------------------------------------------------------------------- Tricuspid valve:  TV is normal Mild TR.     Recent Labs: 05/20/2018: ALT 29; BUN 11; Creatinine, Ser 0.75; Hemoglobin 14.8; Magnesium 1.9; Platelets 226; Potassium 4.3; Sodium 135  No results found for requested labs within last 8760 hours.   Estimated Creatinine Clearance: 66.6 mL/min (by C-G formula based on SCr of 0.75 mg/dL).   Wt Readings from Last 3 Encounters:  06/03/18 198 lb (89.8 kg)  12/19/17 204 lb (92.5 kg)  12/11/17 203 lb (92.1 kg)     Other studies reviewed: Additional studies/records reviewed today include: summarized above  ASSESSMENT AND PLAN:  1. Paroxysmal AFib/flutter     CHA2DS2Vasc is 3, on Eliquis, appropriately dosed     On Sotalol     Qt stable     Labs done 05/20/18, look OK  She is not unhappy with her current control.  Does not want to make any changes.       2. Hx of post termination pauses     Patient has not wanted to pursue pacing     None since our last visit  3. HTN    Looks OK, no changes  4. ILR     Interrogated as noted above   Disposition: she remains comfortable with her current regime, no changes, will continue monthly loop remotes, see her back in 79mo, sooner if needed.   Current medicines are reviewed at length with the patient today.  The patient did not have any concerns regarding medicines.  Venetia Night, PA-C 06/03/2018 10:10 AM     [facility] [address] [place] [address] [phone]  (office)  [phone] (fax)

## 2018-05-28 ENCOUNTER — Ambulatory Visit (HOSPITAL_COMMUNITY): Payer: Medicare Other | Admitting: Internal Medicine

## 2018-06-02 ENCOUNTER — Ambulatory Visit (INDEPENDENT_AMBULATORY_CARE_PROVIDER_SITE_OTHER): Payer: Medicare Other | Admitting: *Deleted

## 2018-06-02 DIAGNOSIS — I480 Paroxysmal atrial fibrillation: Secondary | ICD-10-CM | POA: Diagnosis not present

## 2018-06-02 NOTE — Progress Notes (Signed)
Carelink Summary Report / Loop Recorder 

## 2018-06-03 ENCOUNTER — Ambulatory Visit (INDEPENDENT_AMBULATORY_CARE_PROVIDER_SITE_OTHER): Payer: Medicare Other | Admitting: Physician Assistant

## 2018-06-03 VITALS — BP 124/76 | HR 61 | Ht 63.0 in | Wt 198.0 lb

## 2018-06-03 DIAGNOSIS — I10 Essential (primary) hypertension: Secondary | ICD-10-CM

## 2018-06-03 DIAGNOSIS — Z4509 Encounter for adjustment and management of other cardiac device: Secondary | ICD-10-CM

## 2018-06-03 DIAGNOSIS — I480 Paroxysmal atrial fibrillation: Secondary | ICD-10-CM

## 2018-06-03 DIAGNOSIS — Z79899 Other long term (current) drug therapy: Secondary | ICD-10-CM | POA: Diagnosis not present

## 2018-06-03 NOTE — Patient Instructions (Addendum)
Medication Instructions:   Your physician recommends that you continue on your current medications as directed. Please refer to the Current Medication list given to you today.   If you need a refill on your cardiac medications before your next appointment, please call your pharmacy.  Labwork: NONE ORDERED  TODAY    Testing/Procedures: NONE ORDERED  TODAY    Follow-Up: Your physician wants you to follow-up in:  [place] will receive a reminder letter in the mail two months in advance. If you don't receive a letter, please call our office to schedule the follow-up appointment.  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 This monitoring reduces the number of office visits required to check your device to one time per year. It allows [place] to keep an eye on the functioning of your device to ensure it is working properly. You are scheduled for a device check from home on . 10-18-19You may send your transmission at any time that day. If you have a wireless device, the transmission will be sent automatically. After your physician reviews your transmission, you will receive a postcard with your next transmission date.     Any Other Special Instructions Will Be Listed Below (If Applicable).

## 2018-06-05 ENCOUNTER — Ambulatory Visit
Admission: RE | Admit: 2018-06-05 | Discharge: 2018-06-05 | Disposition: A | Discharge status: Home / Self Care | Payer: Medicare Other | Source: Ambulatory Visit | Attending: Internal Medicine | Admitting: Internal Medicine

## 2018-06-05 ENCOUNTER — Telehealth: Payer: Self-pay | Admitting: Cardiology

## 2018-06-05 ENCOUNTER — Other Ambulatory Visit (HOSPITAL_COMMUNITY): Payer: Self-pay | Admitting: Internal Medicine

## 2018-06-05 DIAGNOSIS — Z1231 Encounter for screening mammogram for malignant neoplasm of breast: Secondary | ICD-10-CM

## 2018-06-05 DIAGNOSIS — Z853 Personal history of malignant neoplasm of breast: Secondary | ICD-10-CM

## 2018-06-05 LAB — CUP PACEART REMOTE DEVICE CHECK
Implantable Pulse Generator Implant Date: 20161207
MDC IDC SESS DTM: 20190813234112

## 2018-06-05 NOTE — Telephone Encounter (Signed)
LMOVM requesting that pt send manual transmission b/c home monitor has not updated in at least 14 days.    

## 2018-06-10 ENCOUNTER — Encounter (HOSPITAL_COMMUNITY): Payer: Self-pay | Admitting: Internal Medicine

## 2018-06-10 ENCOUNTER — Other Ambulatory Visit: Payer: Self-pay

## 2018-06-10 ENCOUNTER — Inpatient Hospital Stay (HOSPITAL_BASED_OUTPATIENT_CLINIC_OR_DEPARTMENT_OTHER): Payer: Medicare Other | Admitting: Internal Medicine

## 2018-06-10 VITALS — BP 121/72 | HR 61 | Resp 18 | Wt 197.6 lb

## 2018-06-10 DIAGNOSIS — I4891 Unspecified atrial fibrillation: Secondary | ICD-10-CM | POA: Diagnosis not present

## 2018-06-10 DIAGNOSIS — G8929 Other chronic pain: Secondary | ICD-10-CM

## 2018-06-10 DIAGNOSIS — M8580 Other specified disorders of bone density and structure, unspecified site: Secondary | ICD-10-CM

## 2018-06-10 DIAGNOSIS — Z7901 Long term (current) use of anticoagulants: Secondary | ICD-10-CM | POA: Diagnosis not present

## 2018-06-10 DIAGNOSIS — M549 Dorsalgia, unspecified: Secondary | ICD-10-CM

## 2018-06-10 DIAGNOSIS — Z853 Personal history of malignant neoplasm of breast: Secondary | ICD-10-CM

## 2018-06-10 DIAGNOSIS — C50911 Malignant neoplasm of unspecified site of right female breast: Secondary | ICD-10-CM | POA: Diagnosis not present

## 2018-06-10 DIAGNOSIS — R252 Cramp and spasm: Secondary | ICD-10-CM

## 2018-06-10 DIAGNOSIS — C50912 Malignant neoplasm of unspecified site of left female breast: Secondary | ICD-10-CM

## 2018-06-10 DIAGNOSIS — Z79811 Long term (current) use of aromatase inhibitors: Secondary | ICD-10-CM

## 2018-06-10 DIAGNOSIS — Z170 Estrogen receptor positive status [ER+]: Secondary | ICD-10-CM | POA: Diagnosis not present

## 2018-06-10 MED ORDER — CYCLOBENZAPRINE HCL 5 MG PO TABS: 5.0000 mg | ORAL_TABLET | Freq: Three times a day (TID) | ORAL | 0 refills | Status: DC | PRN

## 2018-06-10 NOTE — Progress Notes (Signed)
Diagnosis History of breast cancer, bilateral: Right T1N0 Lumpectomy July 2010, Left T1c, N0 Lumpectomy 04/26/2010. - Plan: cyclobenzaprine (FLEXERIL) 5 MG tablet, CBC with Differential/Platelet, Comprehensive metabolic panel, Lactate dehydrogenase  Other chronic back pain - Plan: cyclobenzaprine (FLEXERIL) 5 MG tablet  Staging Cancer Staging No matching staging information was found for the patient.  Assessment and Plan:  1.  ER+ bilateral breast cancer.  Pt reports an intolerance to Femara and stopped the medication in 07/2017.  In the past she has reported an intolerance to 44m Tamoxifen secondary to hot flashes and also reports uterine enlargement and abnormal endometrial lining, with hysteroscopy and D&C without significant pathology.  She also reports an intolerance to aromasin secondary to hot flashes, intolerance to Arimidex due to joint pain.  Pt had BCI prognostic index checked; high risk category, BCI predictive high likelihood of benefit from extended endocrine RX  Previously, I discussed with her that she has been tried on multiple agents.  She was indicating she had very little side effects with tamoxifen.  I discussed with her that due to the uterine abnormalities that were noted while she was on the medication, tamoxifen would not currently be recommended.  I also discussed with her studies have shown breast cancer patients risk for recurrence is significantly lower with the aromatase inhibitors.  She was given option of bone scan due to ongoing complaints of joint pain especially in light of her history of bilateral breast cancer.  She does not desire to have that performed.    She has resumed Femara which she began in 2017 as directed and is planned for 10 years of therapy.  Bilateral 3D screening mammogram done 06/05/2018 reviewed and was negative.  She is recommended for screening mammogram in September 2020.  Pt will follow-up in 11/2018 with labs.  She was previously on Femara for  3 years and stopped it in 07/2017.    2.   Osteopenia.  Patient is on calcium and vitamin D.  She had previously been given option of Prolia but did not desire to take the medication.  Bone density that was done on 11/27/2017 showed osteopenia.  She will have repeat bone density in March 2021  3.  Joint pain.  She was given the option of bone scan for further evaluation due to her ongoing complaints.  She does not desire imaging.  She is reporting muscle spasm and is requesting muscle relaxant.  Pt is given RX for Flexeril but should follow-up with PCP if she has ongoing symptoms or desires additional medications.    4.  Atrial fibrillation.   She is currently on Eliquis.  She should continue to follow-up with cardiology as recommended.  30 minutes spent with more than 50% spent in counseling and coordination of care.    Interval history: Historical data obtained from note dated 12/19/2017.   Diagnosis #1 invasive ductal carcinoma the left breast 1.7 cm in size with LV I. but for negative sentinel nodes. Estrogen receptor +95% progesterone receptor +96% Ki-67 marker low at 9% HER-2/neu not amplified status post lumpectomy followed by radiation therapy. Her surgery was 04/26/2010 and she started tamoxifen on 08/16/2010 after radiation therapy finished on 07/21/2010. She is having tremendous sweating and hot flashes .   Tamoxifen 10 mg daily started by Dr. 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 Estrogen  receptors were 96% positive, progesterone  33% positive Ki-67 marker low 8% HER-2/neu negative and she did not receive any hormonal therapy after the radiation on the right side.  Breast Cancer Index with high likelihood of benefit from extended endocrine therapy. BCI prognostic high risk category.  Pt has ntolerance to 34m  Tamoxifen secondary to hot flashes and also reports uterus enlarged while on medication Intolerance to aromasin secondary to hot flashes Intolerance to Arimidex due to joint symptoms.   Abnormal endometrial lining, with hysteroscopy and D&C without significant pathology BCI prognostic; high risk category, BCI predictive high likelihood of benefit from extended endocrine RX  Current Status: Patient is seen today for follow-up.  She has resumed Femara and reports she is tolerating therapy.  She reports muscle spasms which is not new.    Problem List Patient Active Problem List   Diagnosis Date Noted  . Atrial flutter ([facility] [I48.92] 01/31/2015  . Osteopenia [M85.80] 01/24/2015  . Atypical glandular cells of undetermined significance (AGUS) on cervical Pap smear [R87.619] 11/23/2014  . Thickened endometrium [R93.89] 11/23/2014  . Snoring [R06.83] 09/20/2014  . A-fib ([facility] [I48.91] 06/18/2014  . Pain in joint, ankle and foot [M25.579] 10/16/2011  . History of breast cancer, bilateral: Right T1N0 Lumpectomy July 2010, Left T1c, N0 Lumpectomy 04/26/2010. [Z85.3] 05/04/2011    Past Medical History Past Medical History:  Diagnosis Date  . Breast cancer ([place]   . Cancer of breast ([facility] July 2010   s/p XRT 2010 and again in 2011  . Chronic coughing   . Cramps, muscle, general    severe breast cramping   . Dysrhythmia    atrial flutter  . Glaucoma   . Glaucoma    left eye   . Joint pain    foot and knee pain  . Lymphedema of arm    left  . Night sweats   . Osteopenia 01/24/2015  . Pain    breast pain  . Paroxysmal atrial fibrillation ([place] 05/2014  . Sinus problem   . Wears glasses     Past Surgical History Past Surgical History:  Procedure Laterality Date  . BREAST SURGERY  2010- right and 2011-left   for cancer  . CARDIOVERSION N/A 10/21/2014   Procedure: CARDIOVERSION;  Surgeon: KDorothy Spark MD;  [place]  Service: Cardiovascular;  Laterality: N/A;  .  COLONOSCOPY N/A 02/05/2013   Procedure: COLONOSCOPY;  Surgeon: NRogene Houston MD;  [facility];  Service: Endoscopy;  Laterality: N/A;  830  . ELECTROPHYSIOLOGIC STUDY N/A 02/24/2015   PVI and CTI ablation by Dr ARayann Heman . EP IMPLANTABLE DEVICE N/A 08/24/2015   Procedure: Loop Recorder Insertion;  Surgeon: JThompson Grayer MD;  [facility];  Service: Cardiovascular;  Laterality: N/A;  . HYSTEROSCOPY W/D&C N/A 12/29/2014   Procedure: DILATATION AND CURETTAGE /HYSTEROSCOPY;  Surgeon: LFlorian Buff MD;  Location: [facility];  Service: Gynecology;  Laterality: N/A;  . TEE WITHOUT CARDIOVERSION N/A 02/24/2015   Procedure: TRANSESOPHAGEAL ECHOCARDIOGRAM (TEE);  Surgeon: PFay Records MD;  [facility];  Service: Cardiovascular;  Laterality: N/A;  . TUBAL LIGATION  1984    Family History Family History  Problem Relation Age of Onset  . Heart disease Mother   . Stroke Father   . Cancer Father   . Early death Brother   . Cancer Sister   . Cancer Sister   . Cancer Sister   . Diabetes Daughter   . Breast cancer Neg  Hx      Social History  reports that she quit smoking about 51 years ago. Her smoking use included cigarettes. She started smoking about 55 years ago. She has a 2.00 pack-year smoking history. She has never used smokeless tobacco. She reports that she does not drink alcohol or use drugs.  Medications  Current Outpatient Medications:  .  albuterol (PROVENTIL HFA;VENTOLIN HFA) 108 (90 BASE) MCG/ACT inhaler, Inhale into the lungs every 6 (six) hours as needed for wheezing or shortness of breath. Reported on 11/28/2015, Disp: , Rfl:  .  brimonidine (ALPHAGAN) 0.2 % ophthalmic solution, Place 1 drop into the left eye 2 (two) times daily. Reported on 03/06/2016, Disp: , Rfl:  .  Calcium Carbonate-Vit D-Min (CALTRATE 600+D PLUS PO), Take 1 tablet by mouth daily., Disp: , Rfl:  .  CARTIA XT 180 MG 24 hr capsule, TAKE 1 CAPSULE BY MOUTH ONCE DAILY, Disp: 90  capsule, Rfl: 2 .  Cholecalciferol (VITAMIN D PO), Take 2,000 Units by mouth daily. , Disp: , Rfl:  .  Coenzyme Q10 (COQ-10) 100 MG CAPS, Take 1 tablet by mouth daily., Disp: , Rfl:  .  Cranberry 500 MG CAPS, Take 500 mg by mouth daily. , Disp: , Rfl:  .  diltiazem (CARDIZEM) 30 MG tablet, Cardizem 59m -- take 1 tablet by mouth every 4 hours AS NEEDED for heart rate >100 as long as blood pressure >100., Disp: , Rfl:  .  ELIQUIS 5 MG TABS tablet, TAKE 1 TABLET BY MOUTH TWICE DAILY, Disp: 180 tablet, Rfl: 1 .  fluticasone (FLONASE) 50 MCG/ACT nasal spray, Place 1 spray into both nostrils daily., Disp: , Rfl:  .  furosemide (LASIX) 20 MG tablet, Take 1 tablet (20 mg total) by mouth daily as needed. Take daily as needed for swelling, Disp: 90 tablet, Rfl: 3 .  letrozole (FEMARA) 2.5 MG tablet, Take 1 tablet (2.5 mg total) by mouth daily., Disp: 90 tablet, Rfl: 3 .  loratadine (CLARITIN) 10 MG tablet, Take 10 mg by mouth daily as needed for allergies. Reported on 09/20/2015, Disp: , Rfl:  .  LUMIGAN 0.01 % SOLN, Place 1 drop into both eyes at bedtime., Disp: , Rfl:  .  Magnesium 250 MG TABS, Take 1 tablet (250 mg total) by mouth daily., Disp: , Rfl: 0 .  Multiple Vitamin (MULTIVITAMIN) tablet, Take 1 tablet by mouth daily. , Disp: , Rfl:  .  Omega-3 Fatty Acids (FISH OIL) 1200 MG CAPS, Take 1 capsule by mouth daily., Disp: , Rfl:  .  SOTALOL AF 80 MG TABS, TAKE 1 TABLET BY MOUTH TWICE DAILY, Disp: 180 tablet, Rfl: 2 .  cyclobenzaprine (FLEXERIL) 5 MG tablet, Take 1 tablet (5 mg total) by mouth 3 (three) times daily as needed for muscle spasms., Disp: 30 tablet, Rfl: 0  Allergies Metoprolol; Vesicare [solifenacin]; and Reglan [metoclopramide]  Review of Systems Review of Systems - Oncology ROS negative other than muscle spasms.     Physical Exam  Vitals Wt Readings from Last 3 Encounters:  06/10/18 197 lb 9.6 oz (89.6 kg)  06/03/18 198 lb (89.8 kg)  12/19/17 204 lb (92.5 kg)   Temp Readings  from Last 3 Encounters:  12/19/17 97.7 F (36.5 C) (Oral)  11/23/16 97.7 F (36.5 C) (Oral)  05/24/16 97.7 F (36.5 C) (Oral)   BP Readings from Last 3 Encounters:  06/10/18 121/72  06/03/18 124/76  12/19/17 129/61   Pulse Readings from Last 3 Encounters:  06/10/18 61  06/03/18 61  12/19/17 (!) 57   Constitutional: Well-developed, well-nourished, and in no distress.   HENT: Head: Normocephalic and atraumatic.  Mouth/Throat: No oropharyngeal exudate. Mucosa moist. Eyes: Pupils are equal, round, and reactive to light. Conjunctivae are normal. No scleral icterus.  Neck: Normal range of motion. Neck supple. No JVD present.  Cardiovascular: Normal rate, regular rhythm and normal heart sounds.  Exam reveals no gallop and no friction rub.   No murmur heard. Pulmonary/Chest: Effort normal and breath sounds normal. No respiratory distress. No wheezes.No rales.  Abdominal: Soft. Bowel sounds are normal. No distension. There is no tenderness. There is no guarding.  Musculoskeletal: No edema or tenderness.  Lymphadenopathy: No cervical, axillary or supraclavicular adenopathy.  Neurological: Alert and oriented to person, place, and time. No cranial nerve deficit.  Skin: Skin is warm and dry. No rash noted. No erythema. No pallor.  Psychiatric: Affect and judgment normal.  Breast exam:  Chaperone present.  No dominant masses palpable bilaterally.  Right and left lumpectomy scars healed.    Labs No visits with results within 3 Day(s) from this visit.  Latest known visit with results is:  Appointment on 05/20/2018  Component Date Value Ref Range Status  . WBC 05/20/2018 9.5  4.0 - 10.5 K/uL Final  . RBC 05/20/2018 4.69  3.87 - 5.11 MIL/uL Final  . Hemoglobin 05/20/2018 14.8  12.0 - 15.0 g/dL Final  . HCT 05/20/2018 42.5  36.0 - 46.0 % Final  . MCV 05/20/2018 90.6  78.0 - 100.0 fL Final  . MCH 05/20/2018 31.6  26.0 - 34.0 pg Final  . MCHC 05/20/2018 34.8  30.0 - 36.0 g/dL Final  . RDW  05/20/2018 12.7  11.5 - 15.5 % Final  . Platelets 05/20/2018 226  150 - 400 K/uL Final  . Neutrophils Relative % 05/20/2018 54  % Final  . Neutro Abs 05/20/2018 5.3  1.7 - 7.7 K/uL Final  . Lymphocytes Relative 05/20/2018 32  % Final  . Lymphs Abs 05/20/2018 3.0  0.7 - 4.0 K/uL Final  . Monocytes Relative 05/20/2018 11  % Final  . Monocytes Absolute 05/20/2018 1.0  0.1 - 1.0 K/uL Final  . Eosinophils Relative 05/20/2018 2  % Final  . Eosinophils Absolute 05/20/2018 0.2  0.0 - 0.7 K/uL Final  . Basophils Relative 05/20/2018 1  % Final  . Basophils Absolute 05/20/2018 0.1  0.0 - 0.1 K/uL Final   Performed at [facility], [address], [place], [place] [address]  . Sodium 05/20/2018 135  135 - 145 mmol/L Final  . Potassium 05/20/2018 4.3  3.5 - 5.1 mmol/L Final  . Chloride 05/20/2018 103  98 - 111 mmol/L Final  . CO2 05/20/2018 22  22 - 32 mmol/L Final  . Glucose, Bld 05/20/2018 108* 70 - 99 mg/dL Final  . BUN 05/20/2018 11  8 - 23 mg/dL Final  . Creatinine, Ser 05/20/2018 0.75  0.44 - 1.00 mg/dL Final  . Calcium 05/20/2018 9.3  8.9 - 10.3 mg/dL Final  . Total Protein 05/20/2018 7.3  6.5 - 8.1 g/dL Final  . Albumin 05/20/2018 4.2  3.5 - 5.0 g/dL Final  . AST 05/20/2018 25  15 - 41 U/L Final  . ALT 05/20/2018 29  0 - 44 U/L Final  . Alkaline Phosphatase 05/20/2018 70  38 - 126 U/L Final  . Total Bilirubin 05/20/2018 0.9  0.3 - 1.2 mg/dL Final  . GFR calc non Af Amer 05/20/2018 >60  >60 mL/min Final  . GFR calc Af  Amer 05/20/2018 >60  >60 mL/min Final   Comment: (NOTE) The eGFR has been calculated using the CKD EPI equation. This calculation has not been validated in all clinical situations. eGFR's persistently <60 mL/min signify possible Chronic Kidney Disease.   Georgiann Hahn gap 05/20/2018 10  5 - 15 Final   Performed at [facility], [address], [place], [place] [address]  . Magnesium 05/20/2018 1.9  1.7 - 2.4 mg/dL Final   Performed at [facility], [address],  [place], [place] [address]     Pathology Orders Placed This Encounter  Procedures  . CBC with Differential/Platelet    Standing Status:   Future    Standing Expiration Date:   06/10/2020  . Comprehensive metabolic panel    Standing Status:   Future    Standing Expiration Date:   06/10/2020  . Lactate dehydrogenase    Standing Status:   Future    Standing Expiration Date:   06/10/2020       Zoila Shutter MD

## 2018-06-13 DIAGNOSIS — Z853 Personal history of malignant neoplasm of breast: Secondary | ICD-10-CM | POA: Diagnosis not present

## 2018-06-15 LAB — CUP PACEART REMOTE DEVICE CHECK
Date Time Interrogation Session: 20190916000554
Implantable Pulse Generator Implant Date: 20161207

## 2018-07-04 ENCOUNTER — Ambulatory Visit (INDEPENDENT_AMBULATORY_CARE_PROVIDER_SITE_OTHER): Payer: Medicare Other | Admitting: *Deleted

## 2018-07-04 DIAGNOSIS — I480 Paroxysmal atrial fibrillation: Secondary | ICD-10-CM

## 2018-07-07 DIAGNOSIS — Z23 Encounter for immunization: Secondary | ICD-10-CM | POA: Diagnosis not present

## 2018-07-07 NOTE — Progress Notes (Signed)
Carelink Summary Report / Loop Recorder 

## 2018-07-17 DIAGNOSIS — N390 Urinary tract infection, site not specified: Secondary | ICD-10-CM | POA: Diagnosis not present

## 2018-07-22 ENCOUNTER — Other Ambulatory Visit (HOSPITAL_COMMUNITY): Payer: Self-pay | Admitting: Physician Assistant

## 2018-07-22 LAB — CUP PACEART REMOTE DEVICE CHECK
Date Time Interrogation Session: 20191019004044
Implantable Pulse Generator Implant Date: 20161207

## 2018-07-22 NOTE — Telephone Encounter (Signed)
Eliquis 5mg  refill request received; pt is 72 yrs old, wt-89.6kg, Crea-0.75 on 05/30/18, last seen by Renee on 06/03/18; will send in refill to requested pharmacy.

## 2018-08-06 ENCOUNTER — Ambulatory Visit (INDEPENDENT_AMBULATORY_CARE_PROVIDER_SITE_OTHER): Payer: Medicare Other

## 2018-08-06 DIAGNOSIS — I480 Paroxysmal atrial fibrillation: Secondary | ICD-10-CM

## 2018-08-07 NOTE — Progress Notes (Signed)
Carelink Summary Report / Loop Recorder 

## 2018-08-27 ENCOUNTER — Encounter: Payer: Self-pay | Admitting: Cardiology

## 2018-09-02 ENCOUNTER — Encounter: Payer: Self-pay | Admitting: Cardiology

## 2018-09-02 ENCOUNTER — Telehealth: Payer: Self-pay

## 2018-09-02 NOTE — Telephone Encounter (Signed)
LMOVM requesting that pt send manual transmission b/c home monitor has not updated in at least 14 days.    

## 2018-09-05 DIAGNOSIS — R05 Cough: Secondary | ICD-10-CM | POA: Diagnosis not present

## 2018-09-08 ENCOUNTER — Ambulatory Visit (INDEPENDENT_AMBULATORY_CARE_PROVIDER_SITE_OTHER): Payer: Medicare Other

## 2018-09-08 ENCOUNTER — Telehealth: Payer: Self-pay

## 2018-09-08 DIAGNOSIS — I480 Paroxysmal atrial fibrillation: Secondary | ICD-10-CM | POA: Diagnosis not present

## 2018-09-08 LAB — CUP PACEART REMOTE DEVICE CHECK
Date Time Interrogation Session: 20191223170249
Implantable Pulse Generator Implant Date: 20161207

## 2018-09-08 NOTE — Telephone Encounter (Signed)
Pt returned my call because her monitor did not update for 14 days. She did send 09/03/2018 but when she tried to send today she received an error code [id number]. I gave her the number to [facility] support.

## 2018-09-09 NOTE — Progress Notes (Signed)
Carelink Summary Report / Loop Recorder 

## 2018-09-21 LAB — CUP PACEART REMOTE DEVICE CHECK
Implantable Pulse Generator Implant Date: 20161207
MDC IDC SESS DTM: 20191121003924

## 2018-09-24 DIAGNOSIS — H401121 Primary open-angle glaucoma, left eye, mild stage: Secondary | ICD-10-CM | POA: Diagnosis not present

## 2018-10-10 ENCOUNTER — Ambulatory Visit (INDEPENDENT_AMBULATORY_CARE_PROVIDER_SITE_OTHER): Payer: Medicare Other

## 2018-10-10 DIAGNOSIS — I480 Paroxysmal atrial fibrillation: Secondary | ICD-10-CM

## 2018-10-12 LAB — CUP PACEART REMOTE DEVICE CHECK
MDC IDC PG IMPLANT DT: 20161207
MDC IDC SESS DTM: 20200126010936

## 2018-10-13 NOTE — Progress Notes (Signed)
Carelink Summary Report / Loop Recorder 

## 2018-11-10 ENCOUNTER — Telehealth: Payer: Self-pay | Admitting: Cardiology

## 2018-11-10 NOTE — Telephone Encounter (Signed)
Spoke w/ pt and informed her that her ILR is RRT. Informed her that this will be discussed further at her appt w/ JA on 12/30/2018. Pt verbalized understanding.

## 2018-11-12 ENCOUNTER — Ambulatory Visit (INDEPENDENT_AMBULATORY_CARE_PROVIDER_SITE_OTHER): Payer: Medicare Other | Admitting: *Deleted

## 2018-11-12 DIAGNOSIS — I480 Paroxysmal atrial fibrillation: Secondary | ICD-10-CM

## 2018-11-13 ENCOUNTER — Other Ambulatory Visit (HOSPITAL_COMMUNITY): Payer: Self-pay | Admitting: Nurse Practitioner

## 2018-11-15 LAB — CUP PACEART REMOTE DEVICE CHECK
Date Time Interrogation Session: 20200228013525
Implantable Pulse Generator Implant Date: 20161207

## 2018-11-19 NOTE — Progress Notes (Signed)
Carelink Summary Report / Loop Recorder 

## 2018-12-02 ENCOUNTER — Other Ambulatory Visit: Payer: Self-pay

## 2018-12-02 ENCOUNTER — Other Ambulatory Visit (HOSPITAL_COMMUNITY): Payer: Self-pay | Admitting: *Deleted

## 2018-12-02 ENCOUNTER — Inpatient Hospital Stay (HOSPITAL_COMMUNITY): Payer: Medicare Other | Attending: Hematology

## 2018-12-02 DIAGNOSIS — C50911 Malignant neoplasm of unspecified site of right female breast: Secondary | ICD-10-CM | POA: Diagnosis not present

## 2018-12-02 DIAGNOSIS — Z79811 Long term (current) use of aromatase inhibitors: Secondary | ICD-10-CM | POA: Insufficient documentation

## 2018-12-02 DIAGNOSIS — Z87891 Personal history of nicotine dependence: Secondary | ICD-10-CM | POA: Insufficient documentation

## 2018-12-02 DIAGNOSIS — Z853 Personal history of malignant neoplasm of breast: Secondary | ICD-10-CM

## 2018-12-02 DIAGNOSIS — M8580 Other specified disorders of bone density and structure, unspecified site: Secondary | ICD-10-CM | POA: Diagnosis not present

## 2018-12-02 LAB — COMPREHENSIVE METABOLIC PANEL
ALT: 33 U/L (ref 0–44)
ANION GAP: 9 (ref 5–15)
AST: 31 U/L (ref 15–41)
Albumin: 4.3 g/dL (ref 3.5–5.0)
Alkaline Phosphatase: 73 U/L (ref 38–126)
BUN: 14 mg/dL (ref 8–23)
CALCIUM: 9.3 mg/dL (ref 8.9–10.3)
CO2: 23 mmol/L (ref 22–32)
CREATININE: 0.62 mg/dL (ref 0.44–1.00)
Chloride: 104 mmol/L (ref 98–111)
Glucose, Bld: 95 mg/dL (ref 70–99)
Potassium: 4.1 mmol/L (ref 3.5–5.1)
SODIUM: 136 mmol/L (ref 135–145)
Total Bilirubin: 0.5 mg/dL (ref 0.3–1.2)
Total Protein: 7.4 g/dL (ref 6.5–8.1)

## 2018-12-02 LAB — LACTATE DEHYDROGENASE: LDH: 179 U/L (ref 98–192)

## 2018-12-02 LAB — CBC WITH DIFFERENTIAL/PLATELET
ABS IMMATURE GRANULOCYTES: 0.04 10*3/uL (ref 0.00–0.07)
BASOS PCT: 1 %
Basophils Absolute: 0.1 10*3/uL (ref 0.0–0.1)
EOS ABS: 0.2 10*3/uL (ref 0.0–0.5)
EOS PCT: 2 %
HCT: 39.4 % (ref 36.0–46.0)
Hemoglobin: 13.1 g/dL (ref 12.0–15.0)
Immature Granulocytes: 1 %
Lymphocytes Relative: 30 %
Lymphs Abs: 2.6 10*3/uL (ref 0.7–4.0)
MCH: 30.5 pg (ref 26.0–34.0)
MCHC: 33.2 g/dL (ref 30.0–36.0)
MCV: 91.6 fL (ref 80.0–100.0)
MONO ABS: 1.1 10*3/uL — AB (ref 0.1–1.0)
MONOS PCT: 12 %
NEUTROS ABS: 4.9 10*3/uL (ref 1.7–7.7)
Neutrophils Relative %: 54 %
PLATELETS: 223 10*3/uL (ref 150–400)
RBC: 4.3 MIL/uL (ref 3.87–5.11)
RDW: 12.7 % (ref 11.5–15.5)
WBC: 8.8 10*3/uL (ref 4.0–10.5)
nRBC: 0 % (ref 0.0–0.2)

## 2018-12-02 LAB — MAGNESIUM: Magnesium: 2.2 mg/dL (ref 1.7–2.4)

## 2018-12-09 ENCOUNTER — Inpatient Hospital Stay (HOSPITAL_BASED_OUTPATIENT_CLINIC_OR_DEPARTMENT_OTHER): Payer: Medicare Other | Admitting: Hematology

## 2018-12-09 ENCOUNTER — Other Ambulatory Visit: Payer: Self-pay

## 2018-12-09 ENCOUNTER — Encounter (HOSPITAL_COMMUNITY): Payer: Self-pay | Admitting: Hematology

## 2018-12-09 VITALS — BP 126/58 | HR 68 | Temp 97.8°F | Resp 18 | Wt 200.0 lb

## 2018-12-09 DIAGNOSIS — Z87891 Personal history of nicotine dependence: Secondary | ICD-10-CM | POA: Diagnosis not present

## 2018-12-09 DIAGNOSIS — Z79811 Long term (current) use of aromatase inhibitors: Secondary | ICD-10-CM | POA: Diagnosis not present

## 2018-12-09 DIAGNOSIS — Z853 Personal history of malignant neoplasm of breast: Secondary | ICD-10-CM

## 2018-12-09 DIAGNOSIS — C50912 Malignant neoplasm of unspecified site of left female breast: Secondary | ICD-10-CM

## 2018-12-09 DIAGNOSIS — M8580 Other specified disorders of bone density and structure, unspecified site: Secondary | ICD-10-CM

## 2018-12-09 DIAGNOSIS — C50911 Malignant neoplasm of unspecified site of right female breast: Secondary | ICD-10-CM

## 2018-12-09 NOTE — Progress Notes (Signed)
Heidi Lopez, Heidi Lopez [id number]   CLINIC:  Medical Oncology/Hematology  PCP:  [name], MD [address] [place] [address] [phone]   REASON FOR VISIT:  Follow-up for History of breast cancer, bilateral: Right T1N0 Lumpectomy July 2010, Left T1c, N0 Lumpectomy 04/26/2010     INTERVAL HISTORY:  Heidi Lopez 73 y.o. female returns for routine follow-up. She is here today alone. She states that she has been taking the femara since she stopped taking the tamoxifen, about 6 years. She states that she takes calcium and vit D. She states that she has swelling ankles and feet at times. Denies any nausea, vomiting, or diarrhea. Denies any new pains. Had not noticed any recent bleeding such as epistaxis, hematuria or hematochezia. Denies recent chest pain on exertion, shortness of breath on minimal exertion, pre-syncopal episodes, or palpitations. Denies any numbness or tingling in hands or feet. Denies any recent fevers, infections, or recent hospitalizations. Patient reports appetite at 100% and energy level at 100%.   REVIEW OF SYSTEMS:  Review of Systems  All other systems reviewed and are negative.    PAST MEDICAL/SURGICAL HISTORY:  Past Medical History:  Diagnosis Date  . Breast cancer ([place])   . Cancer of breast [facility]) July 2010   s/p XRT 2010 and again in 2011  . Chronic coughing   . Cramps, muscle, general    severe breast cramping   . Dysrhythmia    atrial flutter  . Glaucoma   . Glaucoma    left eye   . Joint pain    foot and knee pain  . Lymphedema of arm    left  . Night sweats   . Osteopenia 01/24/2015  . Pain    breast pain  . Paroxysmal atrial fibrillation ([place]) 05/2014  . Sinus problem   . Wears glasses    Past Surgical History:  Procedure Laterality Date  . BREAST SURGERY  2010- right and 2011-left   for cancer  . CARDIOVERSION N/A 10/21/2014   Procedure: CARDIOVERSION;  Surgeon: [name], MD;   Location: [place];  Service: Cardiovascular;  Laterality: N/A;  . COLONOSCOPY N/A 02/05/2013   Procedure: COLONOSCOPY;  Surgeon: [name], MD;  [facility];  Service: Endoscopy;  Laterality: N/A;  830  . ELECTROPHYSIOLOGIC STUDY N/A 02/24/2015   PVI and CTI ablation by Dr [name]  . EP IMPLANTABLE DEVICE N/A 08/24/2015   Procedure: Loop Recorder Insertion;  Surgeon: Thompson Grayer, MD;  Location: [place] CV LAB;  Service: Cardiovascular;  Laterality: N/A;  . HYSTEROSCOPY W/D&C N/A 12/29/2014   Procedure: DILATATION AND CURETTAGE /HYSTEROSCOPY;  Surgeon: [name], MD;  [facility];  Service: Gynecology;  Laterality: N/A;  . TEE WITHOUT CARDIOVERSION N/A 02/24/2015   Procedure: TRANSESOPHAGEAL ECHOCARDIOGRAM (TEE);  Surgeon: [name], MD;  Location: [place];  Service: Cardiovascular;  Laterality: N/A;  . TUBAL LIGATION  1984     SOCIAL HISTORY:  Social History   Socioeconomic History  . Marital status: Widowed    Spouse name: Not on file  . Number of children: Not on file  . Years of education: Not on file  . Highest education level: Not on file  Occupational History  . Not on file  Social Needs  . Financial resource strain: Not on file  . Food insecurity:    Worry: Not on file    Inability: Not on file  . Transportation needs:  Medical: Not on file    Non-medical: Not on file  Tobacco Use  . Smoking status: Former Smoker    Packs/day: 1.00    Years: 2.00    Pack years: 2.00    Types: Cigarettes    Start date: 09/17/1962    Last attempt to quit: 11/16/1966    Years since quitting: 52.0  . Smokeless tobacco: Never Used  Substance and Sexual Activity  . Alcohol use: No    Alcohol/week: 0.0 standard drinks  . Drug use: No  . Sexual activity: Not Currently    Birth control/protection: Post-menopausal  Lifestyle  . Physical activity:    Days per week: Not on file    Minutes per session: Not on file  . Stress: Not on file   Relationships  . Social connections:    Talks on phone: Not on file    Gets together: Not on file    Attends religious service: Not on file    Active member of club or organization: Not on file    Attends meetings of clubs or organizations: Not on file    Relationship status: Not on file  . Intimate partner violence:    Fear of current or ex partner: Not on file    Emotionally abused: Not on file    Physically abused: Not on file    Forced sexual activity: Not on file  Other Topics Concern  . Not on file  Social History Narrative   Lives alone in [place]   Retired   Worked previously for [employer] and [employer]    FAMILY HISTORY:  Family History  Problem Relation Age of Onset  . Heart disease Mother   . Stroke Father   . Cancer Father   . Early death Brother   . Cancer Sister   . Cancer Sister   . Cancer Sister   . Diabetes Daughter   . Breast cancer Neg Hx     CURRENT MEDICATIONS:  Outpatient Encounter Medications as of 12/09/2018  Medication Sig  . brimonidine (ALPHAGAN) 0.2 % ophthalmic solution Place 1 drop into the left eye 2 (two) times daily. Reported on 03/06/2016  . Calcium Carbonate-Vit D-Min (CALTRATE 600+D PLUS PO) Take 1 tablet by mouth daily.  Marland Kitchen CARTIA XT 180 MG 24 hr capsule TAKE 1 CAPSULE BY MOUTH ONCE DAILY  . Cholecalciferol (VITAMIN D PO) Take 2,000 Units by mouth daily.   . Coenzyme Q10 (COQ-10) 100 MG CAPS Take 1 tablet by mouth daily.  . Cranberry 500 MG CAPS Take 500 mg by mouth daily.   Marland Kitchen diltiazem (CARDIZEM) 30 MG tablet Cardizem '30mg'$  -- take 1 tablet by mouth every 4 hours AS NEEDED for heart rate >100 as long as blood pressure >100.  Marland Kitchen ELIQUIS 5 MG TABS tablet TAKE 1 TABLET BY MOUTH TWICE DAILY  . fluticasone (FLONASE) 50 MCG/ACT nasal spray Place 1 spray into both nostrils daily as needed.   Marland Kitchen letrozole (FEMARA) 2.5 MG tablet Take 1 tablet (2.5 mg total) by mouth daily.  Marland Kitchen loratadine (CLARITIN) 10 MG tablet Take 10 mg by mouth daily as  needed for allergies. Reported on 09/20/2015  . LUMIGAN 0.01 % SOLN Place 1 drop into both eyes at bedtime.  . Magnesium 250 MG TABS Take 1 tablet (250 mg total) by mouth daily.  . Misc Natural Products (JOINT SUPPORT COMPLEX PO) Take by mouth daily. Per pt she takes an over the counter medication daily for joint pain pt unsure of dosage.  Marland Kitchen  Multiple Vitamin (MULTIVITAMIN) tablet Take 1 tablet by mouth daily.   . Omega-3 Fatty Acids (FISH OIL) 1200 MG CAPS Take 1 capsule by mouth daily.  Marland Kitchen SOTALOL AF 80 MG TABS Take 1 tablet by mouth twice daily  . albuterol (PROVENTIL HFA;VENTOLIN HFA) 108 (90 BASE) MCG/ACT inhaler Inhale into the lungs every 6 (six) hours as needed for wheezing or shortness of breath. Reported on 11/28/2015  . cyclobenzaprine (FLEXERIL) 5 MG tablet Take 1 tablet (5 mg total) by mouth 3 (three) times daily as needed for muscle spasms. (Patient not taking: Reported on 12/09/2018)  . furosemide (LASIX) 20 MG tablet Take 1 tablet (20 mg total) by mouth daily as needed. Take daily as needed for swelling (Patient not taking: Reported on 12/09/2018)   No facility-administered encounter medications on file as of 12/09/2018.     ALLERGIES:  Allergies  Allergen Reactions  . Metoprolol     Makes her feel bad  . Vesicare [Solifenacin]     Severe hypertension and tachycardia   . Reglan [Metoclopramide] Other (See Comments)    Causes severe jitters     PHYSICAL EXAM:  ECOG Performance status: 1  Vitals:   12/09/18 1446  BP: (!) 126/58  Pulse: 68  Resp: 18  Temp: 97.8 F (36.6 C)  SpO2: 98%   Filed Weights   12/09/18 1446  Weight: 200 lb (90.7 kg)    Physical Exam Constitutional:      Appearance: Normal appearance.  Cardiovascular:     Rate and Rhythm: Normal rate and regular rhythm.     Heart sounds: Normal heart sounds.  Pulmonary:     Effort: Pulmonary effort is normal.     Breath sounds: Normal breath sounds.  Abdominal:     Palpations: Abdomen is soft. There is  no mass.  Musculoskeletal:        General: No swelling.  Skin:    General: Skin is warm.  Neurological:     General: No focal deficit present.     Mental Status: She is alert and oriented to person, place, and time.  Psychiatric:        Mood and Affect: Mood normal.        Behavior: Behavior normal.    Right breast lumpectomy in the upper outer quadrant is within normal limits.  There is slight scar thickening.  No palpable mass in the right breast.  Left breast lumpectomy scar in the upper outer quadrant is also within normal limits.  No palpable adenopathy or masses.  LABORATORY DATA:  I have reviewed the labs as listed.  CBC    Component Value Date/Time   WBC 8.8 12/02/2018 1541   RBC 4.30 12/02/2018 1541   HGB 13.1 12/02/2018 1541   HGB 14.3 06/17/2017 1654   HCT 39.4 12/02/2018 1541   HCT 41.9 06/17/2017 1654   PLT 223 12/02/2018 1541   PLT 271 06/17/2017 1654   MCV 91.6 12/02/2018 1541   MCV 90 06/17/2017 1654   MCH 30.5 12/02/2018 1541   MCHC 33.2 12/02/2018 1541   RDW 12.7 12/02/2018 1541   RDW 13.3 06/17/2017 1654   LYMPHSABS 2.6 12/02/2018 1541   LYMPHSABS 3.8 (H) 06/17/2017 1654   MONOABS 1.1 (H) 12/02/2018 1541   EOSABS 0.2 12/02/2018 1541   EOSABS 0.2 06/17/2017 1654   BASOSABS 0.1 12/02/2018 1541   BASOSABS 0.0 06/17/2017 1654   CMP Latest Ref Rng & Units 12/02/2018 05/20/2018 12/12/2017  Glucose 70 - 99 mg/dL 95 108(H) 110(H)  BUN 8 - 23 mg/dL '14 11 13  '$ Creatinine 0.44 - 1.00 mg/dL 0.62 0.75 0.78  Sodium 135 - 145 mmol/L 136 135 135  Potassium 3.5 - 5.1 mmol/L 4.1 4.3 4.4  Chloride 98 - 111 mmol/L 104 103 103  CO2 22 - 32 mmol/L 23 22 21(L)  Calcium 8.9 - 10.3 mg/dL 9.3 9.3 9.0  Total Protein 6.5 - 8.1 g/dL 7.4 7.3 7.0  Total Bilirubin 0.3 - 1.2 mg/dL 0.5 0.9 0.7  Alkaline Phos 38 - 126 U/L 73 70 78  AST 15 - 41 U/L '31 25 28  '$ ALT 0 - 44 U/L 33 29 30       DIAGNOSTIC IMAGING:  I have independently reviewed the scans and discussed with the  patient.   I have reviewed Heidi Lick LPN's note and agree with the documentation.  I personally performed a face-to-face visit, made revisions and my assessment and plan is as follows.    ASSESSMENT & PLAN:   History of breast cancer, bilateral: Right T1N0 Lumpectomy July 2010, Left T1c, N0 Lumpectomy 04/26/2010. 1.  Right breast infiltrating lobular carcinoma: - Status post right lumpectomy and lymph node biopsy on 04/14/2009, pT1b, PN 0, ER/PR positive, Ki-67 8%, margins negative, grade 1, 0.6 cm. -Patient was recommended tamoxifen which she took for approximately 4 years.  This was discontinued secondary to endometrial hyperplasia. - Breast cancer index testing shows high likelihood of benefit. -She was subsequently switched to anastrozole and could not tolerate secondary to musculoskeletal symptoms. -She is currently taking Femara without any problems for the last 6 years.  We plan to continue antiestrogen therapy for a total of 10 years. - Her last mammogram was on 06/05/2018, BI-RADS Category 2. -We will schedule her for another mammogram in September of this year. -I will see her back after mammogram and blood work.  2.  Left breast IDC: -She had a left breast lumpectomy and lymph node biopsy on 04/26/2010.  Pathology showed 1.7 cm, grade 1, with low-grade DCIS, margins negative.  PT1CPN0.  Ki-67 was 9%.  ER/PR positive and HER-2 negative. -Bilateral lumpectomy sites within normal limits.  Bilateral breast has no palpable masses.  3.  Osteopenia: - DEXA scan on 11/27/2017 shows T score of -1.9.  This has improved from a T score of -2.3 on 11/22/2016. -She was offered Prolia but she refused it given the side effects. -She is taking calcium and vitamin D.  She takes approximately 3000 units daily of vitamin D. -I will check vitamin D prior to next visit.      Orders placed this encounter:  Orders Placed This Encounter  Procedures  . MM DIGITAL SCREENING BILATERAL  . CBC with  Differential/Platelet  . Comprehensive metabolic panel  . Vitamin D 25 hydroxy      Derek Jack, MD [place] [phone]

## 2018-12-09 NOTE — Patient Instructions (Addendum)
[place] at [facility] Discharge Instructions  You were seen today by Dr. Delton Coombes. He went over your recent lab results. He did a manual exam and there were no abnormal findings. He will see you back in 6 months for labs and follow up.   Thank you for choosing [place] at [facility] to provide your oncology and hematology care.  To afford each patient quality time with our provider, please arrive at least 15 minutes before your scheduled appointment time.   If you have a lab appointment with the [facility] please come in thru the  Main Entrance and check in at the main information desk  You need to re-schedule your appointment should you arrive 10 or more minutes late.  We strive to give you quality time with our providers, and arriving late affects you and other patients whose appointments are after yours.  Also, if you no show three or more times for appointments you may be dismissed from the clinic at the providers discretion.     Again, thank you for choosing [facility].  Our hope is that these requests will decrease the amount of time that you wait before being seen by our physicians.       _____________________________________________________________  Should you have questions after your visit to [facility], please contact our office at [phone] between the hours of 8:00 a.m. and 4:30 p.m.  Voicemails left after 4:00 p.m. will not be returned until the following business day.  For prescription refill requests, have your pharmacy contact our office and allow 72 hours.    Cancer Center Support Programs:   > Cancer Support Group  2nd Tuesday of the month 1pm-2pm, Journey Room

## 2018-12-09 NOTE — Assessment & Plan Note (Addendum)
1.  Right breast infiltrating lobular carcinoma: - Status post right lumpectomy and lymph node biopsy on 04/14/2009, pT1b, PN 0, ER/PR positive, Ki-67 8%, margins negative, grade 1, 0.6 cm. -Patient was recommended tamoxifen which she took for approximately 4 years.  This was discontinued secondary to endometrial hyperplasia. - Breast cancer index testing shows high likelihood of benefit. -She was subsequently switched to anastrozole and could not tolerate secondary to musculoskeletal symptoms. -She is currently taking Femara without any problems for the last 6 years.  We plan to continue antiestrogen therapy for a total of 10 years. - Her last mammogram was on 06/05/2018, BI-RADS Category 2. -We will schedule her for another mammogram in September of this year. -I will see her back after mammogram and blood work.  2.  Left breast IDC: -She had a left breast lumpectomy and lymph node biopsy on 04/26/2010.  Pathology showed 1.7 cm, grade 1, with low-grade DCIS, margins negative.  PT1CPN0.  Ki-67 was 9%.  ER/PR positive and HER-2 negative. -Bilateral lumpectomy sites within normal limits.  Bilateral breast has no palpable masses.  3.  Osteopenia: - DEXA scan on 11/27/2017 shows T score of -1.9.  This has improved from a T score of -2.3 on 11/22/2016. -She was offered Prolia but she refused it given the side effects. -She is taking calcium and vitamin D.  She takes approximately 3000 units daily of vitamin D. -I will check vitamin D prior to next visit.

## 2018-12-16 ENCOUNTER — Other Ambulatory Visit: Payer: Self-pay | Admitting: Internal Medicine

## 2018-12-16 ENCOUNTER — Ambulatory Visit (INDEPENDENT_AMBULATORY_CARE_PROVIDER_SITE_OTHER): Payer: Medicare Other | Admitting: *Deleted

## 2018-12-16 DIAGNOSIS — I480 Paroxysmal atrial fibrillation: Secondary | ICD-10-CM | POA: Diagnosis not present

## 2018-12-17 LAB — CUP PACEART REMOTE DEVICE CHECK
Date Time Interrogation Session: 20200401021115
Implantable Pulse Generator Implant Date: 20161207

## 2018-12-24 ENCOUNTER — Other Ambulatory Visit: Payer: Self-pay

## 2018-12-24 ENCOUNTER — Telehealth: Payer: Self-pay | Admitting: Internal Medicine

## 2018-12-24 NOTE — Telephone Encounter (Signed)
Error

## 2018-12-24 NOTE — Progress Notes (Signed)
Carelink Summary Report / Loop Recorder 

## 2018-12-29 ENCOUNTER — Encounter: Payer: Medicare Other | Admitting: Internal Medicine

## 2019-01-02 ENCOUNTER — Other Ambulatory Visit (HOSPITAL_COMMUNITY): Payer: Self-pay | Admitting: Hematology

## 2019-01-02 ENCOUNTER — Telehealth: Payer: Self-pay

## 2019-01-02 NOTE — Telephone Encounter (Signed)
Unable to speak  with patient to remind of missed remote transmission 

## 2019-01-05 ENCOUNTER — Other Ambulatory Visit (HOSPITAL_COMMUNITY): Payer: Self-pay | Admitting: Nurse Practitioner

## 2019-01-19 ENCOUNTER — Encounter: Payer: Medicare Other | Admitting: *Deleted

## 2019-01-19 ENCOUNTER — Other Ambulatory Visit: Payer: Self-pay

## 2019-02-05 ENCOUNTER — Other Ambulatory Visit (HOSPITAL_COMMUNITY): Payer: Self-pay | Admitting: Nurse Practitioner

## 2019-02-10 ENCOUNTER — Other Ambulatory Visit (HOSPITAL_COMMUNITY): Payer: Self-pay | Admitting: Nurse Practitioner

## 2019-02-11 ENCOUNTER — Other Ambulatory Visit: Payer: Self-pay | Admitting: Internal Medicine

## 2019-02-11 MED ORDER — SOTALOL HCL (AF) 80 MG PO TABS: 1.0000 | ORAL_TABLET | Freq: Two times a day (BID) | ORAL | 0 refills | Status: DC

## 2019-02-11 MED ORDER — DILTIAZEM HCL ER COATED BEADS 180 MG PO CP24: 180.0000 mg | ORAL_CAPSULE | Freq: Every day | ORAL | 0 refills | Status: DC

## 2019-04-01 ENCOUNTER — Other Ambulatory Visit (HOSPITAL_COMMUNITY): Payer: Self-pay | Admitting: Physician Assistant

## 2019-04-01 ENCOUNTER — Other Ambulatory Visit (HOSPITAL_COMMUNITY): Payer: Self-pay | Admitting: Hematology

## 2019-04-01 NOTE — Telephone Encounter (Signed)
Eliquis 5mg  refill request received; pt is 73 yrs old, wt-90.7kg, Crea-0.62 on 12/02/2018, last seen by [facility] on 06/03/2018; will send in refill to requested pharmacy.

## 2019-04-29 ENCOUNTER — Encounter: Payer: Self-pay | Admitting: Internal Medicine

## 2019-04-29 ENCOUNTER — Ambulatory Visit (INDEPENDENT_AMBULATORY_CARE_PROVIDER_SITE_OTHER): Payer: Medicare Other | Admitting: Internal Medicine

## 2019-04-29 ENCOUNTER — Other Ambulatory Visit: Payer: Self-pay

## 2019-04-29 VITALS — BP 126/72 | HR 56 | Ht 63.0 in | Wt 193.0 lb

## 2019-04-29 DIAGNOSIS — I10 Essential (primary) hypertension: Secondary | ICD-10-CM

## 2019-04-29 DIAGNOSIS — I480 Paroxysmal atrial fibrillation: Secondary | ICD-10-CM

## 2019-04-29 HISTORY — PX: OTHER SURGICAL HISTORY: SHX169

## 2019-04-29 LAB — CUP PACEART INCLINIC DEVICE CHECK
Date Time Interrogation Session: 20200812171053
Implantable Pulse Generator Implant Date: 20161207

## 2019-04-29 NOTE — Progress Notes (Signed)
PCP: [name], MD Primary EP: Dr [name]  Heidi Lopez is a 73 y.o. female who presents today for routine electrophysiology followup.  Since last being seen in our clinic, the patient reports doing very well.  She feels that her afib is stable.  Today, she denies symptoms of palpitations, chest pain, shortness of breath,  lower extremity edema, dizziness, presyncope, or syncope.  The patient is otherwise without complaint today.   Past Medical History:  Diagnosis Date  . Breast cancer ([place])   . Cancer of breast [facility]) July 2010   s/p XRT 2010 and again in 2011  . Chronic coughing   . Cramps, muscle, general    severe breast cramping   . Dysrhythmia    atrial flutter  . Glaucoma   . Glaucoma    left eye   . Joint pain    foot and knee pain  . Lymphedema of arm    left  . Night sweats   . Osteopenia 01/24/2015  . Pain    breast pain  . Paroxysmal atrial fibrillation ([place]) 05/2014  . Sinus problem   . Wears glasses    Past Surgical History:  Procedure Laterality Date  . BREAST SURGERY  2010- right and 2011-left   for cancer  . CARDIOVERSION N/A 10/21/2014   Procedure: CARDIOVERSION;  Surgeon: [name], MD;  Location: [place];  Service: Cardiovascular;  Laterality: N/A;  . COLONOSCOPY N/A 02/05/2013   Procedure: COLONOSCOPY;  Surgeon: [name], MD;  [facility];  Service: Endoscopy;  Laterality: N/A;  830  . ELECTROPHYSIOLOGIC STUDY N/A 02/24/2015   PVI and CTI ablation by Dr [name]  . EP IMPLANTABLE DEVICE N/A 08/24/2015   Procedure: Loop Recorder Insertion;  Surgeon: Thompson Grayer, MD;  Location: [place] CV LAB;  Service: Cardiovascular;  Laterality: N/A;  . HYSTEROSCOPY W/D&C N/A 12/29/2014   Procedure: DILATATION AND CURETTAGE /HYSTEROSCOPY;  Surgeon: [name], MD;  [facility];  Service: Gynecology;  Laterality: N/A;  . TEE WITHOUT CARDIOVERSION N/A 02/24/2015   Procedure: TRANSESOPHAGEAL ECHOCARDIOGRAM (TEE);  Surgeon: [name], MD;  Location: [place];  Service: Cardiovascular;  Laterality: N/A;  . TUBAL LIGATION  1984    ROS- all systems are reviewed and negatives except as per HPI above  Current Outpatient Medications  Medication Sig Dispense Refill  . albuterol (PROVENTIL HFA;VENTOLIN HFA) 108 (90 BASE) MCG/ACT inhaler Inhale into the lungs every 6 (six) hours as needed for wheezing or shortness of breath. Reported on 11/28/2015    . brimonidine (ALPHAGAN) 0.2 % ophthalmic solution Place 1 drop into the left eye 2 (two) times daily. Reported on 03/06/2016    . Calcium Carbonate-Vit D-Min (CALTRATE 600+D PLUS PO) Take 1 tablet by mouth daily.    . Cholecalciferol (VITAMIN D PO) Take 2,000 Units by mouth daily.     . Coenzyme Q10 (COQ-10) 100 MG CAPS Take 1 tablet by mouth daily.    . Cranberry 500 MG CAPS Take 500 mg by mouth daily.     . cyclobenzaprine (FLEXERIL) 5 MG tablet Take 1 tablet (5 mg total) by mouth 3 (three) times daily as needed for muscle spasms. 30 tablet 0  . diltiazem (CARDIZEM) 30 MG tablet TAKE ONE TABLET BY MOUTH EVERY 4 HOURS AS NEEDED FOR HEART RATE >100 AS LONG AS BLOOD PRESSURE >100 45 tablet 3  . diltiazem (CARTIA XT) 180 MG 24 hr capsule Take 1 capsule (180 mg total) by  mouth daily. Please keep upcoming appt in August with Dr. [name] before anymore refills. Thank you 90 capsule 0  . ELIQUIS 5 MG TABS tablet Take 1 tablet by mouth twice daily 180 tablet 1  . fluticasone (FLONASE) 50 MCG/ACT nasal spray Place 1 spray into both nostrils daily as needed.     . furosemide (LASIX) 20 MG tablet Take 1 tablet (20 mg total) by mouth daily as needed. Take daily as needed for swelling 90 tablet 3  . letrozole (FEMARA) 2.5 MG tablet Take 1 tablet by mouth once daily 90 tablet 3  . loratadine (CLARITIN) 10 MG tablet Take 10 mg by mouth daily as needed for allergies. Reported on 09/20/2015    . LUMIGAN 0.01 % SOLN Place 1 drop into both eyes at bedtime.    Marland Kitchen MAGNESIUM PO Take 400 mg by mouth daily.     . Misc Natural Products (JOINT SUPPORT COMPLEX PO) Take by mouth daily. Per pt she takes an over the counter medication daily for joint pain pt unsure of dosage.    . Multiple Vitamin (MULTIVITAMIN) tablet Take 1 tablet by mouth daily.     . Omega-3 Fatty Acids (FISH OIL) 1200 MG CAPS Take 1 capsule by mouth daily.    Marland Kitchen SOTALOL AF 80 MG TABS Take 1 tablet (80 mg total) by mouth 2 (two) times daily. Please keep upcoming appt in August with Dr. [name] before anymore refills. Thank you 180 tablet 0   No current facility-administered medications for this visit.     Physical Exam: Vitals:   04/29/19 1610  BP: 126/72  Pulse: (!) 56  SpO2: 97%  Weight: 193 lb (87.5 kg)  Height: 5\' 3"  (1.6 m)    GEN- The patient is well appearing, alert and oriented x 3 today.   Head- normocephalic, atraumatic Eyes-  Sclera clear, conjunctiva pink Ears- hearing intact Oropharynx- clear Lungs- Clear to ausculation bilaterally, normal work of breathing Heart- Regular rate and rhythm, no murmurs, rubs or gallops, PMI not laterally displaced GI- soft, NT, ND, + BS Extremities- no clubbing, cyanosis, or edema  Wt Readings from Last 3 Encounters:  04/29/19 193 lb (87.5 kg)  12/09/18 200 lb (90.7 kg)  06/10/18 197 lb 9.6 oz (89.6 kg)    EKG tracing ordered today is personally reviewed and shows sinus bradycardia  Assessment and Plan:  1. Paroxysmal atrial fibrillation/ atypical atrial flutter S/p PVI and CTI ablation Doing reasonably well She has declined repeat ablation Her ILR has reached EOL.  She would like to have this removed.  Risks and benefits to ILR removal were discussed at length with the patient and she wishes to proceed.  2. Sick sinus syndrome Resolved Asymptomatic  3. HTN Stable No change required today    Thompson Grayer MD, [facility] 04/29/2019 4:12 PM       DESCRIPTION OF PROCEDURE:  Informed written consent was obtained.  The patient required no sedation for the procedure  today.   The patients left chest was therefore prepped and draped in the usual sterile fashion.  The skin overlying the ILR monitor was infiltrated with lidocaine for local analgesia.  A 0.5-cm incision was made over the site.  The previously implanted ILR was exposed and removed using a combination of sharp and blunt dissection.  Steri- Strips and a sterile dressing were then applied. EBL<22ml.  There were no early apparent complications.     CONCLUSIONS:   1. Successful explantation of a [employer] Reveal LINQ implantable loop  recorder   2. No early apparent complications.        Thompson Grayer MD, [facility]

## 2019-04-29 NOTE — Patient Instructions (Addendum)
Medication Instructions:  Your physician recommends that you continue on your current medications as directed. Please refer to the Current Medication list given to you today.  * If you need a refill on your cardiac medications before your next appointment, please call your pharmacy.   Labwork: None ordered  Testing/Procedures: None ordered  Follow-Up: Your physician wants you to follow-up in: 6 months with Tommye Standard, PA.  You will receive a reminder letter in the mail two months in advance. If you don't receive a letter, please call our office to schedule the follow-up appointment.  Thank you for choosing [facility]!!

## 2019-05-04 ENCOUNTER — Encounter: Payer: Self-pay | Admitting: Internal Medicine

## 2019-06-04 ENCOUNTER — Other Ambulatory Visit: Payer: Self-pay

## 2019-06-04 ENCOUNTER — Inpatient Hospital Stay (HOSPITAL_COMMUNITY): Payer: Medicare Other | Attending: Hematology

## 2019-06-04 DIAGNOSIS — C50912 Malignant neoplasm of unspecified site of left female breast: Secondary | ICD-10-CM | POA: Insufficient documentation

## 2019-06-04 DIAGNOSIS — C50911 Malignant neoplasm of unspecified site of right female breast: Secondary | ICD-10-CM | POA: Insufficient documentation

## 2019-06-04 DIAGNOSIS — Z170 Estrogen receptor positive status [ER+]: Secondary | ICD-10-CM | POA: Diagnosis not present

## 2019-06-04 DIAGNOSIS — Z853 Personal history of malignant neoplasm of breast: Secondary | ICD-10-CM

## 2019-06-04 LAB — CBC WITH DIFFERENTIAL/PLATELET
Abs Immature Granulocytes: 0.03 10*3/uL (ref 0.00–0.07)
Basophils Absolute: 0.1 10*3/uL (ref 0.0–0.1)
Basophils Relative: 1 %
Eosinophils Absolute: 0.2 10*3/uL (ref 0.0–0.5)
Eosinophils Relative: 2 %
HCT: 43.6 % (ref 36.0–46.0)
Hemoglobin: 14.3 g/dL (ref 12.0–15.0)
Immature Granulocytes: 0 %
Lymphocytes Relative: 32 %
Lymphs Abs: 2.9 10*3/uL (ref 0.7–4.0)
MCH: 30.3 pg (ref 26.0–34.0)
MCHC: 32.8 g/dL (ref 30.0–36.0)
MCV: 92.4 fL (ref 80.0–100.0)
Monocytes Absolute: 1 10*3/uL (ref 0.1–1.0)
Monocytes Relative: 11 %
Neutro Abs: 5 10*3/uL (ref 1.7–7.7)
Neutrophils Relative %: 54 %
Platelets: 240 10*3/uL (ref 150–400)
RBC: 4.72 MIL/uL (ref 3.87–5.11)
RDW: 12.5 % (ref 11.5–15.5)
WBC: 9.2 10*3/uL (ref 4.0–10.5)
nRBC: 0 % (ref 0.0–0.2)

## 2019-06-04 LAB — COMPREHENSIVE METABOLIC PANEL
ALT: 28 U/L (ref 0–44)
AST: 24 U/L (ref 15–41)
Albumin: 4.2 g/dL (ref 3.5–5.0)
Alkaline Phosphatase: 74 U/L (ref 38–126)
Anion gap: 11 (ref 5–15)
BUN: 14 mg/dL (ref 8–23)
CO2: 21 mmol/L — ABNORMAL LOW (ref 22–32)
Calcium: 9 mg/dL (ref 8.9–10.3)
Chloride: 102 mmol/L (ref 98–111)
Creatinine, Ser: 0.75 mg/dL (ref 0.44–1.00)
GFR calc Af Amer: >60 mL/min (ref >60)
GFR calc non Af Amer: >60 mL/min (ref >60)
Glucose, Bld: 112 mg/dL — ABNORMAL HIGH (ref 70–99)
Potassium: 4.4 mmol/L (ref 3.5–5.1)
Sodium: 134 mmol/L — ABNORMAL LOW (ref 135–145)
Total Bilirubin: 0.6 mg/dL (ref 0.3–1.2)
Total Protein: 7 g/dL (ref 6.5–8.1)

## 2019-06-05 LAB — VITAMIN D 25 HYDROXY (VIT D DEFICIENCY, FRACTURES): Vit D, 25-Hydroxy: 37.3 ng/mL (ref 30.0–100.0)

## 2019-06-08 ENCOUNTER — Ambulatory Visit
Admission: RE | Admit: 2019-06-08 | Discharge: 2019-06-08 | Disposition: A | Discharge status: Home / Self Care | Payer: Medicare Other | Source: Ambulatory Visit | Attending: Hematology | Admitting: Hematology

## 2019-06-08 ENCOUNTER — Other Ambulatory Visit: Payer: Self-pay

## 2019-06-08 DIAGNOSIS — Z853 Personal history of malignant neoplasm of breast: Secondary | ICD-10-CM

## 2019-06-08 DIAGNOSIS — Z1231 Encounter for screening mammogram for malignant neoplasm of breast: Secondary | ICD-10-CM | POA: Diagnosis not present

## 2019-06-11 ENCOUNTER — Inpatient Hospital Stay (HOSPITAL_BASED_OUTPATIENT_CLINIC_OR_DEPARTMENT_OTHER): Payer: Medicare Other | Admitting: Hematology

## 2019-06-11 ENCOUNTER — Ambulatory Visit (HOSPITAL_COMMUNITY): Payer: Medicare Other | Admitting: Hematology

## 2019-06-11 ENCOUNTER — Other Ambulatory Visit: Payer: Self-pay | Admitting: Internal Medicine

## 2019-06-11 ENCOUNTER — Other Ambulatory Visit: Payer: Self-pay

## 2019-06-11 ENCOUNTER — Encounter (HOSPITAL_COMMUNITY): Payer: Self-pay | Admitting: Hematology

## 2019-06-11 DIAGNOSIS — C50912 Malignant neoplasm of unspecified site of left female breast: Secondary | ICD-10-CM | POA: Diagnosis not present

## 2019-06-11 DIAGNOSIS — C50911 Malignant neoplasm of unspecified site of right female breast: Secondary | ICD-10-CM | POA: Diagnosis not present

## 2019-06-11 DIAGNOSIS — Z170 Estrogen receptor positive status [ER+]: Secondary | ICD-10-CM | POA: Diagnosis not present

## 2019-06-11 DIAGNOSIS — Z853 Personal history of malignant neoplasm of breast: Secondary | ICD-10-CM

## 2019-06-11 MED ORDER — DILTIAZEM HCL ER COATED BEADS 180 MG PO CP24: 180.0000 mg | ORAL_CAPSULE | Freq: Every day | ORAL | 3 refills | Status: DC

## 2019-06-25 DIAGNOSIS — H401111 Primary open-angle glaucoma, right eye, mild stage: Secondary | ICD-10-CM | POA: Diagnosis not present

## 2019-06-25 DIAGNOSIS — H40001 Preglaucoma, unspecified, right eye: Secondary | ICD-10-CM | POA: Diagnosis not present

## 2019-07-02 DIAGNOSIS — Z23 Encounter for immunization: Secondary | ICD-10-CM | POA: Diagnosis not present

## 2019-07-10 DIAGNOSIS — Z853 Personal history of malignant neoplasm of breast: Secondary | ICD-10-CM | POA: Diagnosis not present

## 2019-08-05 ENCOUNTER — Other Ambulatory Visit: Payer: Self-pay | Admitting: Internal Medicine

## 2019-08-05 MED ORDER — SOTALOL HCL (AF) 80 MG PO TABS: 1.0000 | ORAL_TABLET | Freq: Two times a day (BID) | ORAL | 1 refills | Status: DC

## 2019-08-05 NOTE — Progress Notes (Signed)
[place], [place] [address]   CLINIC:  Medical Oncology/Hematology  PCP:  [name], MD [address] [place] [address] [phone]   REASON FOR VISIT:  Follow-up for Breast Cancer   CURRENT THERAPY: Clinical Survenillance    INTERVAL HISTORY:  Heidi Lopez 73 y.o. female presents today for follow up. Reports overall doing well. Denies any significant fatigue. Denies any changes in her breast. No new lumps, masses, nipple inversion or discharge.  Denies any changes in bowel habits. No weight loss. Appetite is stable. She is here to discuss labs and recent mammogram.    REVIEW OF SYSTEMS:  Review of Systems  Constitutional: Positive for fatigue.  HENT:  Negative.   Eyes: Negative.   Respiratory: Negative.   Cardiovascular: Negative.   Gastrointestinal: Negative.   Endocrine: Negative.   Genitourinary: Negative.    Musculoskeletal: Positive for arthralgias and myalgias. Negative for gait problem.  Skin: Negative.   Neurological: Positive for extremity weakness. Negative for gait problem.  Hematological: Negative.   Psychiatric/Behavioral: Negative.      PAST MEDICAL/SURGICAL HISTORY:  Past Medical History:  Diagnosis Date  . Breast cancer ([place])   . Cancer of breast [facility]) July 2010   s/p XRT 2010 and again in 2011  . Chronic coughing   . Cramps, muscle, general    severe breast cramping   . Dysrhythmia    atrial flutter  . Glaucoma   . Glaucoma    left eye   . Joint pain    foot and knee pain  . Lymphedema of arm    left  . Night sweats   . Osteopenia 01/24/2015  . Pain    breast pain  . Paroxysmal atrial fibrillation ([place]) 05/2014  . Sinus problem   . Wears glasses    Past Surgical History:  Procedure Laterality Date  . BREAST SURGERY  2010- right and 2011-left   for cancer  . CARDIOVERSION N/A 10/21/2014   Procedure: CARDIOVERSION;  Surgeon: [name], MD;  Location: [facility];  Service:  Cardiovascular;  Laterality: N/A;  . COLONOSCOPY N/A 02/05/2013   Procedure: COLONOSCOPY;  Surgeon: [name], MD;  [facility];  Service: Endoscopy;  Laterality: N/A;  830  . ELECTROPHYSIOLOGIC STUDY N/A 02/24/2015   PVI and CTI ablation by Dr [name]  . EP IMPLANTABLE DEVICE N/A 08/24/2015   Procedure: Loop Recorder Insertion;  Surgeon: Thompson Grayer, MD;  Location: [facility];  Service: Cardiovascular;  Laterality: N/A;  . HYSTEROSCOPY W/D&C N/A 12/29/2014   Procedure: DILATATION AND CURETTAGE /HYSTEROSCOPY;  Surgeon: [name], MD;  Location: [facility];  Service: Gynecology;  Laterality: N/A;  . Implantable loop recorder removal  04/29/2019   MDT Reveal LINQ removed by Dr [name] in office  . TEE WITHOUT CARDIOVERSION N/A 02/24/2015   Procedure: TRANSESOPHAGEAL ECHOCARDIOGRAM (TEE);  Surgeon: [name], MD;  Location: [facility];  Service: Cardiovascular;  Laterality: N/A;  . TUBAL LIGATION  1984     SOCIAL HISTORY:  Social History   Socioeconomic History  . Marital status: Widowed    Spouse name: Not on file  . Number of children: Not on file  . Years of education: Not on file  . Highest education level: Not on file  Occupational History  . Not on file  Social Needs  . Financial resource strain: Not on file  . Food insecurity    Worry: Not on file  Inability: Not on file  . Transportation needs    Medical: Not on file    Non-medical: Not on file  Tobacco Use  . Smoking status: Former Smoker    Packs/day: 1.00    Years: 2.00    Pack years: 2.00    Types: Cigarettes    Start date: 09/17/1962    Quit date: 11/16/1966    Years since quitting: 52.7  . Smokeless tobacco: Never Used  Substance and Sexual Activity  . Alcohol use: No    Alcohol/week: 0.0 standard drinks  . Drug use: No  . Sexual activity: Not Currently    Birth control/protection: Post-menopausal  Lifestyle  . Physical activity    Days per week: Not on file    Minutes per  session: Not on file  . Stress: Not on file  Relationships  . Social Herbalist on phone: Not on file    Gets together: Not on file    Attends religious service: Not on file    Active member of club or organization: Not on file    Attends meetings of clubs or organizations: Not on file    Relationship status: Not on file  . Intimate partner violence    Fear of current or ex partner: Not on file    Emotionally abused: Not on file    Physically abused: Not on file    Forced sexual activity: Not on file  Other Topics Concern  . Not on file  Social History Narrative   Lives alone in [place]   Retired   Worked previously for [employer] and [employer]    FAMILY HISTORY:  Family History  Problem Relation Age of Onset  . Heart disease Mother   . Stroke Father   . Cancer Father   . Early death Brother   . Cancer Sister   . Cancer Sister   . Cancer Sister   . Diabetes Daughter   . Breast cancer Neg Hx     CURRENT MEDICATIONS:  Outpatient Encounter Medications as of 06/11/2019  Medication Sig  . brimonidine (ALPHAGAN) 0.2 % ophthalmic solution Place 1 drop into the left eye 2 (two) times daily. Reported on 03/06/2016  . Calcium Carbonate-Vit D-Min (CALTRATE 600+D PLUS PO) Take 1 tablet by mouth daily.  . Cholecalciferol (VITAMIN D PO) Take 2,000 Units by mouth daily.   . Coenzyme Q10 (COQ-10) 100 MG CAPS Take 1 tablet by mouth daily.  . Cranberry 500 MG CAPS Take 500 mg by mouth daily.   Marland Kitchen diltiazem (CARDIZEM) 30 MG tablet TAKE ONE TABLET BY MOUTH EVERY 4 HOURS AS NEEDED FOR HEART RATE >100 AS LONG AS BLOOD PRESSURE >100  . ELIQUIS 5 MG TABS tablet Take 1 tablet by mouth twice daily  . letrozole (FEMARA) 2.5 MG tablet Take 1 tablet by mouth once daily  . LUMIGAN 0.01 % SOLN Place 1 drop into both eyes at bedtime.  Marland Kitchen MAGNESIUM PO Take 400 mg by mouth daily.  . Misc Natural Products (JOINT SUPPORT COMPLEX PO) Take by mouth daily. Per pt she takes an over the counter  medication daily for joint pain pt unsure of dosage.  . Multiple Vitamin (MULTIVITAMIN) tablet Take 1 tablet by mouth daily.   . Omega-3 Fatty Acids (FISH OIL) 1200 MG CAPS Take 1 capsule by mouth daily.  Marland Kitchen SOTALOL AF 80 MG TABS Take 1 tablet (80 mg total) by mouth 2 (two) times daily. Please keep upcoming appt in August with Dr.  Allred before anymore refills. Thank you  . [DISCONTINUED] diltiazem (CARTIA XT) 180 MG 24 hr capsule Take 1 capsule (180 mg total) by mouth daily. Please keep upcoming appt in August with Dr. [name] before anymore refills. Thank you  . albuterol (PROVENTIL HFA;VENTOLIN HFA) 108 (90 BASE) MCG/ACT inhaler Inhale into the lungs every 6 (six) hours as needed for wheezing or shortness of breath. Reported on 11/28/2015  . fluticasone (FLONASE) 50 MCG/ACT nasal spray Place 1 spray into both nostrils daily as needed.   . furosemide (LASIX) 20 MG tablet Take 1 tablet (20 mg total) by mouth daily as needed. Take daily as needed for swelling (Patient not taking: Reported on 06/11/2019)  . loratadine (CLARITIN) 10 MG tablet Take 10 mg by mouth daily as needed for allergies. Reported on 09/20/2015  . [DISCONTINUED] cyclobenzaprine (FLEXERIL) 5 MG tablet Take 1 tablet (5 mg total) by mouth 3 (three) times daily as needed for muscle spasms. (Patient not taking: Reported on 06/11/2019)   No facility-administered encounter medications on file as of 06/11/2019.     ALLERGIES:  Allergies  Allergen Reactions  . Metoprolol     Makes her feel bad  . Vesicare [Solifenacin]     Severe hypertension and tachycardia   . Reglan [Metoclopramide] Other (See Comments)    Causes severe jitters     PHYSICAL EXAM:  ECOG Performance status: 1  Vitals:   06/11/19 1318  BP: (!) 141/59  Pulse: 64  Resp: 18  Temp: 97.9 F (36.6 C)  SpO2: 97%   Filed Weights   06/11/19 1318  Weight: 203 lb (92.1 kg)    Physical Exam Constitutional:      Appearance: Normal appearance. She is obese.  HENT:      Head: Normocephalic.     Right Ear: External ear normal.     Left Ear: External ear normal.     Nose: Nose normal.     Mouth/Throat:     Pharynx: Oropharynx is clear.  Eyes:     Conjunctiva/sclera: Conjunctivae normal.  Neck:     Musculoskeletal: Normal range of motion.  Cardiovascular:     Rate and Rhythm: Normal rate and regular rhythm.     Pulses: Normal pulses.     Heart sounds: Normal heart sounds.  Pulmonary:     Effort: Pulmonary effort is normal.     Breath sounds: Normal breath sounds.  Abdominal:     General: Bowel sounds are normal.  Musculoskeletal:     Comments: Decreased ROM   Skin:    General: Skin is warm.  Neurological:     General: No focal deficit present.     Mental Status: She is alert and oriented to person, place, and time.  Psychiatric:        Mood and Affect: Mood normal.        Behavior: Behavior normal.        Thought Content: Thought content normal.        Judgment: Judgment normal.      LABORATORY DATA:  I have reviewed the labs as listed.  CBC    Component Value Date/Time   WBC 9.2 06/04/2019 1108   RBC 4.72 06/04/2019 1108   HGB 14.3 06/04/2019 1108   HGB 14.3 06/17/2017 1654   HCT 43.6 06/04/2019 1108   HCT 41.9 06/17/2017 1654   PLT 240 06/04/2019 1108   PLT 271 06/17/2017 1654   MCV 92.4 06/04/2019 1108   MCV 90 06/17/2017 1654   MCH  30.3 06/04/2019 1108   MCHC 32.8 06/04/2019 1108   RDW 12.5 06/04/2019 1108   RDW 13.3 06/17/2017 1654   LYMPHSABS 2.9 06/04/2019 1108   LYMPHSABS 3.8 (H) 06/17/2017 1654   MONOABS 1.0 06/04/2019 1108   EOSABS 0.2 06/04/2019 1108   EOSABS 0.2 06/17/2017 1654   BASOSABS 0.1 06/04/2019 1108   BASOSABS 0.0 06/17/2017 1654   CMP Latest Ref Rng & Units 06/04/2019 12/02/2018 05/20/2018  Glucose 70 - 99 mg/dL 112(H) 95 108(H)  BUN 8 - 23 mg/dL _0 Creatinine 0.44 - 1.00 mg/dL 0.75 0.62 0.75  Sodium 135 - 145 mmol/L 134(L) 136 135  Potassium 3.5 - 5.1 mmol/L 4.4 4.1 4.3  Chloride 98 - 111  mmol/L 102 104 103  CO2 22 - 32 mmol/L 21(L) 23 22  Calcium 8.9 - 10.3 mg/dL 9.0 9.3 9.3  Total Protein 6.5 - 8.1 g/dL 7.0 7.4 7.3  Total Bilirubin 0.3 - 1.2 mg/dL 0.6 0.5 0.9  Alkaline Phos 38 - 126 U/L 74 73 70  AST 15 - 41 U/L _1 ALT 0 - 44 U/L 28 33 29           ASSESSMENT & PLAN:   History of breast cancer, bilateral: Right T1N0 Lumpectomy July 2010, Left T1c, N0 Lumpectomy 04/26/2010. 1.  Right breast infiltrating lobular carcinoma: - Status post right lumpectomy and lymph node biopsy on 04/14/2009, pT1b, PN 0, ER/PR positive, Ki-67 8%, margins negative, grade 1, 0.6 cm. -Patient was recommended tamoxifen which she took for approximately 4 years.  This was discontinued secondary to endometrial hyperplasia. - Breast cancer index testing shows high likelihood of benefit. -She was subsequently switched to anastrozole and could not tolerate secondary to musculoskeletal symptoms. -She is currently taking Femara without any problems for the last 6 years.  We plan to continue antiestrogen therapy for a total of 10 years. - Her last mammogram was on 06/05/2018, BI-RADS Category 2. -Mammogram completed on June 08, 2019 did not reveal any evidence of malignancy. -Physical exam did not reveal any concerning findings. -She will return to clinic in 6 months.  2.  Left breast IDC: -She had a left breast lumpectomy and lymph node biopsy on 04/26/2010.  Pathology showed 1.7 cm, grade 1, with low-grade DCIS, margins negative.  PT1CPN0.  Ki-67 was 9%.  ER/PR positive and HER-2 negative. -Bilateral lumpectomy sites within normal limits.  Bilateral breast has no palpable masses.  3.  Osteopenia: - DEXA scan on 11/27/2017 shows T score of -1.9.  This has improved from a T score of -2.3 on 11/22/2016. -She was offered Prolia but she refused it given the side effects. -She is taking calcium and vitamin D.  She takes approximately 3000 units daily of vitamin D. -I will check vitamin D prior  to next visit.         [facility] [phone]

## 2019-08-05 NOTE — Assessment & Plan Note (Signed)
1.  Right breast infiltrating lobular carcinoma: - Status post right lumpectomy and lymph node biopsy on 04/14/2009, pT1b, PN 0, ER/PR positive, Ki-67 8%, margins negative, grade 1, 0.6 cm. -Patient was recommended tamoxifen which she took for approximately 4 years.  This was discontinued secondary to endometrial hyperplasia. - Breast cancer index testing shows high likelihood of benefit. -She was subsequently switched to anastrozole and could not tolerate secondary to musculoskeletal symptoms. -She is currently taking Femara without any problems for the last 6 years.  We plan to continue antiestrogen therapy for a total of 10 years. - Her last mammogram was on 06/05/2018, BI-RADS Category 2. -Mammogram completed on June 08, 2019 did not reveal any evidence of malignancy. -Physical exam did not reveal any concerning findings. -She will return to clinic in 6 months.  2.  Left breast IDC: -She had a left breast lumpectomy and lymph node biopsy on 04/26/2010.  Pathology showed 1.7 cm, grade 1, with low-grade DCIS, margins negative.  PT1CPN0.  Ki-67 was 9%.  ER/PR positive and HER-2 negative. -Bilateral lumpectomy sites within normal limits.  Bilateral breast has no palpable masses.  3.  Osteopenia: - DEXA scan on 11/27/2017 shows T score of -1.9.  This has improved from a T score of -2.3 on 11/22/2016. -She was offered Prolia but she refused it given the side effects. -She is taking calcium and vitamin D.  She takes approximately 3000 units daily of vitamin D. -I will check vitamin D prior to next visit. 

## 2019-08-06 ENCOUNTER — Encounter (INDEPENDENT_AMBULATORY_CARE_PROVIDER_SITE_OTHER): Payer: Self-pay | Admitting: *Deleted

## 2019-08-06 ENCOUNTER — Telehealth: Payer: Self-pay | Admitting: *Deleted

## 2019-08-06 ENCOUNTER — Encounter (INDEPENDENT_AMBULATORY_CARE_PROVIDER_SITE_OTHER): Payer: Self-pay | Admitting: Nurse Practitioner

## 2019-08-06 ENCOUNTER — Ambulatory Visit (INDEPENDENT_AMBULATORY_CARE_PROVIDER_SITE_OTHER): Payer: Medicare Other | Admitting: Nurse Practitioner

## 2019-08-06 ENCOUNTER — Other Ambulatory Visit: Payer: Self-pay

## 2019-08-06 ENCOUNTER — Telehealth (INDEPENDENT_AMBULATORY_CARE_PROVIDER_SITE_OTHER): Payer: Self-pay | Admitting: *Deleted

## 2019-08-06 DIAGNOSIS — K921 Melena: Secondary | ICD-10-CM

## 2019-08-06 MED ORDER — PLENVU 140 G PO SOLR: 1.0000 | Freq: Once | ORAL | 0 refills | Status: AC

## 2019-08-06 NOTE — Patient Instructions (Signed)
1.  Schedule a colonoscopy  2.  Our office will contact her cardiologist Dr. [name] to verify your Eliquis instructions prior to your colonoscopy date  3.  Call our office if you have persistent or worsening rectal bleeding  4.  Desitin diaper rash ointment apply a small amount inside the anal opening 2-3 times daily as needed for hemorrhoidal irritation/bleeding  5.  Further follow-up to be determined after colonoscopy completed

## 2019-08-06 NOTE — Telephone Encounter (Signed)
   [facility] Pre-operative Risk Assessment    Request for surgical clearance:  1. What type of surgery is being performed? COLONOSCOPY   2. When is this surgery scheduled? 09/24/19   3. What type of clearance is required (medical clearance vs. Pharmacy clearance to hold med vs. Both)? BOTH  4. Are there any medications that need to be held prior to surgery and how long? ELIQUIS 2 DAYS PRIOR   5. Practice name and name of physician performing surgery? [facility]; DR. Laural Golden  6. What is your office phone number [phone]    7.   What is your office fax number [phone]  8.   Anesthesia type (None, local, MAC, general) ? NOT LISTED    Heidi Lopez 08/06/2019, 4:06 PM  _________________________________________________________________   (provider comments below)

## 2019-08-06 NOTE — Telephone Encounter (Signed)
Left message for the patient to call back and speak to the on call preop APP 

## 2019-08-06 NOTE — Telephone Encounter (Signed)
Patient needs plenvu TCS sch'd 09/24/19

## 2019-08-06 NOTE — Telephone Encounter (Signed)
Clinical pharmacist to review eliquis 

## 2019-08-06 NOTE — Progress Notes (Signed)
Subjective:    Patient ID: Heidi Lopez, female    DOB: 1946-03-25, 73 y.o.   MRN: [id number]  Heidi Lopez is a 73 year old female with a past medical history of bilateral breast cancer status post right and left breast lumpectomy and radiation in 2010, atrial fibrillation on Eliquis.  She presents today for further evaluation regarding bright red rectal bleeding which has occurred intermittently over the past year.  Approximately once monthly, she passes a normal formed stool with a small amount of bright red blood on the toilet tissue and in the toilet water.  No significant anal or rectal discomfort.  Her most recent episode was 2 days ago, she had an urge to pass a bowel movement and when she sat on the commode she had a "blowout" of soft to loose stool with bright red blood on the toilet tissue.  Her most recent colonoscopy was in 2014 which identified sigmoid diverticulosis, no polyps.  Her son was diagnosed with colon cancer at the age of 31 and he required surgery and radiation.  Her father was diagnosed with colon cancer at the age of 73.  She has 2 sisters with a history of breast cancer.  No other complaints today.  Colonoscopy 02/05/2013:  Prep satisfactory. Few scattered diverticula at sigmoid colon. Normal rectal mucosa. Hemorrhoids below the dentate line along with focal erythema dentate line. 10 year recall.   CBC Latest Ref Rng & Units 06/04/2019 12/02/2018 05/20/2018  WBC 4.0 - 10.5 K/uL 9.2 8.8 9.5  Hemoglobin 12.0 - 15.0 g/dL 14.3 13.1 14.8  Hematocrit 36.0 - 46.0 % 43.6 39.4 42.5  Platelets 150 - 400 K/uL 240 223 226   Past Medical History:  Diagnosis Date  . Breast cancer ([place])   . Cancer of breast [facility]) July 2010   s/p XRT 2010 and again in 2011  . Chronic coughing   . Cramps, muscle, general    severe breast cramping   . Dysrhythmia    atrial flutter  . Glaucoma   . Glaucoma    left eye   . Joint pain    foot and knee pain  . Lymphedema of arm    left   . Night sweats   . Osteopenia 01/24/2015  . Pain    breast pain  . Paroxysmal atrial fibrillation ([place]) 05/2014  . Sinus problem   . Wears glasses     Social History   Socioeconomic History  . Marital status: Widowed    Spouse name: Not on file  . Number of children: Not on file  . Years of education: Not on file  . Highest education level: Not on file  Occupational History  . Not on file  Social Needs  . Financial resource strain: Not on file  . Food insecurity    Worry: Not on file    Inability: Not on file  . Transportation needs    Medical: Not on file    Non-medical: Not on file  Tobacco Use  . Smoking status: Former Smoker    Packs/day: 1.00    Years: 2.00    Pack years: 2.00    Types: Cigarettes    Start date: 09/17/1962    Quit date: 11/16/1966    Years since quitting: 52.7  . Smokeless tobacco: Never Used  Substance and Sexual Activity  . Alcohol use: No    Alcohol/week: 0.0 standard drinks  . Drug use: No  . Sexual activity: Not Currently  Birth control/protection: Post-menopausal  Lifestyle  . Physical activity    Days per week: Not on file    Minutes per session: Not on file  . Stress: Not on file  Relationships  . Social Herbalist on phone: Not on file    Gets together: Not on file    Attends religious service: Not on file    Active member of club or organization: Not on file    Attends meetings of clubs or organizations: Not on file    Relationship status: Not on file  . Intimate partner violence    Fear of current or ex partner: Not on file    Emotionally abused: Not on file    Physically abused: Not on file    Forced sexual activity: Not on file  Other Topics Concern  . Not on file  Social History Narrative   Lives alone in [place]   Retired   Worked previously for [employer] and [employer]   Current Outpatient Medications on File Prior to Visit  Medication Sig Dispense Refill  . albuterol (PROVENTIL HFA;VENTOLIN HFA) 108  (90 BASE) MCG/ACT inhaler Inhale into the lungs every 6 (six) hours as needed for wheezing or shortness of breath. Reported on 11/28/2015    . brimonidine (ALPHAGAN) 0.2 % ophthalmic solution Place 1 drop into the left eye 2 (two) times daily. Reported on 03/06/2016    . Calcium Carbonate-Vit D-Min (CALTRATE 600+D PLUS PO) Take 1 tablet by mouth daily.    . Cholecalciferol (VITAMIN D PO) Take 2,000 Units by mouth daily.     . Coenzyme Q10 (COQ-10) 100 MG CAPS Take 1 tablet by mouth daily.    . Cranberry 500 MG CAPS Take 500 mg by mouth daily.     Marland Kitchen diltiazem (CARDIZEM) 30 MG tablet TAKE ONE TABLET BY MOUTH EVERY 4 HOURS AS NEEDED FOR HEART RATE >100 AS LONG AS BLOOD PRESSURE >100 45 tablet 3  . diltiazem (CARTIA XT) 180 MG 24 hr capsule Take 1 capsule (180 mg total) by mouth daily. 90 capsule 3  . ELIQUIS 5 MG TABS tablet Take 1 tablet by mouth twice daily 180 tablet 1  . fluticasone (FLONASE) 50 MCG/ACT nasal spray Place 1 spray into both nostrils daily as needed.     . furosemide (LASIX) 20 MG tablet Take 1 tablet (20 mg total) by mouth daily as needed. Take daily as needed for swelling 90 tablet 3  . letrozole (FEMARA) 2.5 MG tablet Take 1 tablet by mouth once daily 90 tablet 3  . loratadine (CLARITIN) 10 MG tablet Take 10 mg by mouth daily as needed for allergies. Reported on 09/20/2015    . LUMIGAN 0.01 % SOLN Place 1 drop into both eyes at bedtime.    Marland Kitchen MAGNESIUM PO Take 400 mg by mouth daily.    . Misc Natural Products (JOINT SUPPORT COMPLEX PO) Take 40 mg by mouth daily. Per pt she takes an over the counter medication daily for joint pain pt unsure of dosage.     . Multiple Vitamin (MULTIVITAMIN) tablet Take 1 tablet by mouth daily.     . Omega-3 Fatty Acids (FISH OIL) 1200 MG CAPS Take 1 capsule by mouth daily.    Marland Kitchen SOTALOL AF 80 MG TABS Take 1 tablet (80 mg total) by mouth 2 (two) times daily. 180 tablet 1   No current facility-administered medications on file prior to visit.    Allergies   Allergen Reactions  . Metoprolol  Makes her feel bad  . Vesicare [Solifenacin]     Severe hypertension and tachycardia   . Reglan [Metoclopramide] Other (See Comments)    Causes severe jitters    Review of Systems  Gen: Denies fever, sweats or chills. No weight loss.  CV: Denies chest pain or dizziness. Resp: Denies cough, shortness of breath of hemoptysis.  GI: See HPI.  Denies heartburn, dysphagia, stomach or lower abdominal pain. No diarrhea or constipation.  GU : Denies urinary burning, blood in urine, increased urinary frequency or incontinence. MS: Denies having any new joint pain, muscles aches or weakness. Derm: Denies rash, itchiness, skin lesions or unhealing ulcers. Psych: Denies depression, anxiety or memory loss. Heme: + Easy bruising. Neuro:  Denies headaches, dizziness or paresthesias. Endo:  Denies any problems with DM, thyroid or adrenal function.     Objective:   Physical Exam BP 138/84 (BP Location: Right Arm, Patient Position: Sitting, Cuff Size: Normal)   Pulse 84   Temp 99 F (37.2 C) (Oral)   Ht 5\' 3"  (1.6 m)   Wt 205 lb 1.6 oz (93 kg)   BMI 36.33 kg/m  General: 73 year old female well-developed in no acute distress Eyes: Sclera nonicteric, conjunctiva pink Mouth: No ulcers or lesions Neck: Supple, no lymphadenopathy Heart: Irregular rhythm, no murmur Lungs: Breath sounds clear throughout Abdomen: Soft, nontender, no masses or organomegaly, positive bowel sounds all 4 quadrants Rectal: Small external hemorrhoids, anal hemorrhoids without active bleeding at this time.  Brown stool in the rectal vault.  Left lateral rectal area was firm without discrete mass. Extremities: Bilateral 1+ lower extremity edema Neuro: Alert and oriented x4, no focal deficits    Assessment & Plan:   91.  73 year old female with a personal history of bilateral breast cancer presents with episodic rectal bleeding.  Positive family history of colorectal cancer.  -Colonoscopy benefits and risk discussed including risk with sedation, bleeding and perforation -Our office will contact her cardiologist Dr. [name] to verify Eliquis instructions prior to her colonoscopy -Further follow-up to be determined after colonoscopy completed -We will call our office if she has worsening rectal bleeding  2.  Anal hemorrhoid -Desitin apply a small amount inside the anal opening 2-3 times daily as needed  3.  History of atrial fibrillation on Eliquis

## 2019-08-06 NOTE — Telephone Encounter (Signed)
Pt takes Eliquis for afib with CHADS2VASc score of 3 (age, sex, HTN). Renal function is normal. Ok to hold Eliquis for 2 days prior to colonoscopy as requested.

## 2019-08-07 ENCOUNTER — Other Ambulatory Visit (INDEPENDENT_AMBULATORY_CARE_PROVIDER_SITE_OTHER): Payer: Self-pay | Admitting: *Deleted

## 2019-08-07 NOTE — Telephone Encounter (Signed)
Unable to reach the patient, the phone rings for awhile then the phone call drops. I tried again, same thing happened.

## 2019-08-10 NOTE — Telephone Encounter (Signed)
   Primary Cardiologist: No primary care provider on file.  Electrophysiology:  Thompson Grayer, MD  Chart reviewed as part of pre-operative protocol coverage. Patient was contacted 08/10/2019 in reference to pre-operative risk assessment for pending surgery as outlined below.  Heidi Lopez was last seen on 04/29/2019 by Dr. [name].  Since that day, Heidi Lopez has done well. She has PAF and has occ breakthrough afib by her BP monitor, but does not last long. She is maintained on diltiazem and sotalol with extra as needed diltiazem.  She took 1 dose of her as needed diltiazem last week with good response.  She has no associated symptoms with her A. fib, no chest discomfort, shortness of breath or lightheadedness.  She has good exertional tolerance. She is on anticoagulation for stroke risk reduction.  Therefore, based on ACC/AHA guidelines, the patient would be at acceptable risk for the planned procedure without further cardiovascular testing.   According to our pharmacy protocol: Pt takes Eliquis for afib with CHADS2VASc score of 3 (age, sex, HTN). Renal function is normal. Ok to hold Eliquis for 2 days prior to colonoscopy as requested.         I will route this recommendation to the requesting party via Epic fax function and remove from pre-op pool.  Please call with questions.  Daune Perch, NP 08/10/2019, 11:25 AM

## 2019-08-27 ENCOUNTER — Encounter (INDEPENDENT_AMBULATORY_CARE_PROVIDER_SITE_OTHER): Payer: Self-pay | Admitting: *Deleted

## 2019-09-20 ENCOUNTER — Other Ambulatory Visit (HOSPITAL_COMMUNITY): Payer: Self-pay | Admitting: Internal Medicine

## 2019-09-21 NOTE — Telephone Encounter (Signed)
Eliquis 5mg  refill request received, pt is 74yrs old, weight-93kg, Crea-0.75 on 06/04/2019, Diagnosis-Afib, and last seen by Dr. [name] on 04/29/2019. Dose is appropriate based on dosing criteria. Will send in refill to requested pharmacy.

## 2019-09-22 ENCOUNTER — Other Ambulatory Visit: Payer: Self-pay

## 2019-09-22 ENCOUNTER — Other Ambulatory Visit (HOSPITAL_COMMUNITY): Payer: Medicare Other

## 2019-09-22 ENCOUNTER — Other Ambulatory Visit (HOSPITAL_COMMUNITY)
Admission: RE | Admit: 2019-09-22 | Discharge: 2019-09-22 | Disposition: A | Discharge status: Home / Self Care | Payer: Medicare Other | Source: Ambulatory Visit | Attending: Internal Medicine | Admitting: Internal Medicine

## 2019-09-22 DIAGNOSIS — Z01812 Encounter for preprocedural laboratory examination: Secondary | ICD-10-CM | POA: Insufficient documentation

## 2019-09-22 DIAGNOSIS — Z20822 Contact with and (suspected) exposure to covid-19: Secondary | ICD-10-CM | POA: Insufficient documentation

## 2019-09-23 LAB — SARS CORONAVIRUS 2 (TAT 6-24 HRS): SARS Coronavirus 2: NEGATIVE

## 2019-09-24 ENCOUNTER — Encounter (HOSPITAL_COMMUNITY): Payer: Self-pay | Admitting: Internal Medicine

## 2019-09-24 ENCOUNTER — Other Ambulatory Visit: Payer: Self-pay

## 2019-09-24 ENCOUNTER — Ambulatory Visit (HOSPITAL_COMMUNITY)
Admission: RE | Admit: 2019-09-24 | Discharge: 2019-09-24 | Disposition: A | Discharge status: Home / Self Care | Payer: Medicare Other | Attending: Internal Medicine | Admitting: Internal Medicine

## 2019-09-24 ENCOUNTER — Encounter (HOSPITAL_COMMUNITY)
Admission: RE | Disposition: A | Discharge status: Home / Self Care | Payer: Self-pay | Source: Home / Self Care | Attending: Internal Medicine

## 2019-09-24 DIAGNOSIS — Z800 Family history of malignant neoplasm of digestive organs: Secondary | ICD-10-CM | POA: Insufficient documentation

## 2019-09-24 DIAGNOSIS — K644 Residual hemorrhoidal skin tags: Secondary | ICD-10-CM | POA: Insufficient documentation

## 2019-09-24 DIAGNOSIS — Z79899 Other long term (current) drug therapy: Secondary | ICD-10-CM | POA: Diagnosis not present

## 2019-09-24 DIAGNOSIS — C50919 Malignant neoplasm of unspecified site of unspecified female breast: Secondary | ICD-10-CM | POA: Diagnosis not present

## 2019-09-24 DIAGNOSIS — K5730 Diverticulosis of large intestine without perforation or abscess without bleeding: Secondary | ICD-10-CM | POA: Insufficient documentation

## 2019-09-24 DIAGNOSIS — I480 Paroxysmal atrial fibrillation: Secondary | ICD-10-CM | POA: Insufficient documentation

## 2019-09-24 DIAGNOSIS — H409 Unspecified glaucoma: Secondary | ICD-10-CM | POA: Diagnosis not present

## 2019-09-24 DIAGNOSIS — Z87891 Personal history of nicotine dependence: Secondary | ICD-10-CM | POA: Insufficient documentation

## 2019-09-24 DIAGNOSIS — Z7901 Long term (current) use of anticoagulants: Secondary | ICD-10-CM | POA: Insufficient documentation

## 2019-09-24 DIAGNOSIS — K921 Melena: Secondary | ICD-10-CM | POA: Insufficient documentation

## 2019-09-24 DIAGNOSIS — Z79811 Long term (current) use of aromatase inhibitors: Secondary | ICD-10-CM | POA: Insufficient documentation

## 2019-09-24 DIAGNOSIS — Z923 Personal history of irradiation: Secondary | ICD-10-CM | POA: Diagnosis not present

## 2019-09-24 HISTORY — PX: COLONOSCOPY: SHX5424

## 2019-09-24 SURGERY — COLONOSCOPY
Anesthesia: Moderate Sedation

## 2019-09-24 MED ORDER — METAMUCIL SMOOTH TEXTURE 58.6 % PO POWD: 1.0000 | Freq: Every day | ORAL | Status: DC

## 2019-09-24 MED ORDER — STERILE WATER FOR IRRIGATION IR SOLN
Status: DC | PRN
  Administered 2019-09-24: 1.5 mL

## 2019-09-24 MED ORDER — HYDROCORTISONE ACETATE 25 MG RE SUPP: 25.0000 mg | Freq: Every day | RECTAL | 1 refills | Status: DC

## 2019-09-24 MED ORDER — MEPERIDINE HCL 50 MG/ML IJ SOLN
INTRAMUSCULAR | Status: AC
  Filled 2019-09-24: qty 1

## 2019-09-24 MED ORDER — MEPERIDINE HCL 50 MG/ML IJ SOLN
INTRAMUSCULAR | Status: DC | PRN
  Administered 2019-09-24 (×2): 25 mg via INTRAVENOUS

## 2019-09-24 MED ORDER — SODIUM CHLORIDE 0.9 % IV SOLN: INTRAVENOUS | Status: DC

## 2019-09-24 MED ORDER — MIDAZOLAM HCL 5 MG/5ML IJ SOLN
INTRAMUSCULAR | Status: AC
  Filled 2019-09-24: qty 10

## 2019-09-24 MED ORDER — MIDAZOLAM HCL 5 MG/5ML IJ SOLN
INTRAMUSCULAR | Status: DC | PRN
  Administered 2019-09-24: 2 mg via INTRAVENOUS
  Administered 2019-09-24: 1 mg via INTRAVENOUS
  Administered 2019-09-24: 2 mg via INTRAVENOUS

## 2019-09-24 NOTE — Discharge Instructions (Signed)
Resume usual medications including Eliquis as before. Anusol HC suppository 1 per rectum daily at bedtime for 2 weeks. Metamucil 3 to 4 g by mouth daily at bedtime. High-fiber diet.  Next colonoscopy in 5 years. Please keep record as to frequency of bleeding episodes and call with progress report in 2 to 3 months.   Colonoscopy, Adult, Care After This sheet gives you information about how to care for yourself after your procedure. Your doctor may also give you more specific instructions. If you have problems or questions, call your doctor. What can I expect after the procedure? After the procedure, it is common to have:  A small amount of blood in your poop (stool) for 24 hours.  Some gas.  Mild cramping or bloating in your belly (abdomen). Follow these instructions at home: Eating and drinking   Drink enough fluid to keep your pee (urine) pale yellow.  Follow instructions from your doctor about what you cannot eat or drink.  Return to your normal diet as told by your doctor. Avoid heavy or fried foods that are hard to digest. Activity  Rest as told by your doctor.  Do not sit for a long time without moving. Get up to take short walks every 1-2 hours. This is important. Ask for help if you feel weak or unsteady.  Return to your normal activities as told by your doctor. Ask your doctor what activities are safe for you. To help cramping and bloating:   Try walking around.  Put heat on your belly as told by your doctor. Use the heat source that your doctor recommends, such as a moist heat pack or a heating pad. ? Put a towel between your skin and the heat source. ? Leave the heat on for 20-30 minutes. ? Remove the heat if your skin turns bright red. This is very important if you are unable to feel pain, heat, or cold. You may have a greater risk of getting burned. General instructions  For the first 24 hours after the procedure: ? Do not drive or use machinery. ? Do not  sign important documents. ? Do not drink alcohol. ? Do your daily activities more slowly than normal. ? Eat foods that are soft and easy to digest.  Take over-the-counter or prescription medicines only as told by your doctor.  Keep all follow-up visits as told by your doctor. This is important. Contact a doctor if:  You have blood in your poop 2-3 days after the procedure. Get help right away if:  You have more than a small amount of blood in your poop.  You see large clumps of tissue (blood clots) in your poop.  Your belly is swollen.  You feel like you may vomit (nauseous).  You vomit.  You have a fever.  You have belly pain that gets worse, and medicine does not help your pain. Summary  After the procedure, it is common to have a small amount of blood in your poop. You may also have mild cramping and bloating in your belly.  For the first 24 hours after the procedure, do not drive or use machinery, do not sign important documents, and do not drink alcohol.  Get help right away if you have a lot of blood in your poop, feel like you may vomit, have a fever, or have more belly pain. This information is not intended to replace advice given to you by your health care provider. Make sure you discuss any questions you have  with your health care provider. Document Revised: 03/30/2019 Document Reviewed: 03/30/2019 Elsevier Patient Education  [place].  High-Fiber Diet Fiber, also called dietary fiber, is a type of carbohydrate that is found in fruits, vegetables, whole grains, and beans. A high-fiber diet can have many health benefits. Your health care provider may recommend a high-fiber diet to help:  Prevent constipation. Fiber can make your bowel movements more regular.  Lower your cholesterol.  Relieve the following conditions: ? Swelling of veins in the anus (hemorrhoids). ? Swelling and irritation (inflammation) of specific areas of the digestive tract  (uncomplicated diverticulosis). ? A problem of the large intestine (colon) that sometimes causes pain and diarrhea (irritable bowel syndrome, IBS).  Prevent overeating as part of a weight-loss plan.  Prevent heart disease, type 2 diabetes, and certain cancers. What is my plan? The recommended daily fiber intake in grams (g) includes:  38 g for men age 51 or younger.  30 g for men over age 54.  6 g for women age 5 or younger.  21 g for women over age 15. You can get the recommended daily intake of dietary fiber by:  Eating a variety of fruits, vegetables, grains, and beans.  Taking a fiber supplement, if it is not possible to get enough fiber through your diet. What do I need to know about a high-fiber diet?  It is better to get fiber through food sources rather than from fiber supplements. There is not a lot of research about how effective supplements are.  Always check the fiber content on the nutrition facts label of any prepackaged food. Look for foods that contain 5 g of fiber or more per serving.  Talk with a diet and nutrition specialist (dietitian) if you have questions about specific foods that are recommended or not recommended for your medical condition, especially if those foods are not listed below.  Gradually increase how much fiber you consume. If you increase your intake of dietary fiber too quickly, you may have bloating, cramping, or gas.  Drink plenty of water. Water helps you to digest fiber. What are tips for following this plan?  Eat a wide variety of high-fiber foods.  Make sure that half of the grains that you eat each day are whole grains.  Eat breads and cereals that are made with whole-grain flour instead of refined flour or white flour.  Eat brown rice, bulgur wheat, or millet instead of white rice.  Start the day with a breakfast that is high in fiber, such as a cereal that contains 5 g of fiber or more per serving.  Use beans in place of meat  in soups, salads, and pasta dishes.  Eat high-fiber snacks, such as berries, raw vegetables, nuts, and popcorn.  Choose whole fruits and vegetables instead of processed forms like juice or sauce. What foods can I eat?  Fruits Berries. Pears. Apples. Oranges. Avocado. Prunes and raisins. Dried figs. Vegetables Sweet potatoes. Spinach. Kale. Artichokes. Cabbage. Broccoli. Cauliflower. Green peas. Carrots. Squash. Grains Whole-grain breads. Multigrain cereal. Oats and oatmeal. Brown rice. Barley. Bulgur wheat. [place]. Quinoa. Bran muffins. Popcorn. Rye wafer crackers. Meats and other proteins Navy, kidney, and pinto beans. Soybeans. Split peas. Lentils. Nuts and seeds. Dairy Fiber-fortified yogurt. Beverages Fiber-fortified soy milk. Fiber-fortified orange juice. Other foods Fiber bars. The items listed above may not be a complete list of recommended foods and beverages. Contact a dietitian for more options. What foods are not recommended? Fruits Fruit juice. Cooked, strained fruit.  Vegetables Fried potatoes. Canned vegetables. Well-cooked vegetables. Grains White bread. Pasta made with refined flour. White rice. Meats and other proteins Fatty cuts of meat. Fried chicken or fried fish. Dairy Milk. Yogurt. Cream cheese. Sour cream. Fats and oils Butters. Beverages Soft drinks. Other foods Cakes and pastries. The items listed above may not be a complete list of foods and beverages to avoid. Contact a dietitian for more information. Summary  Fiber is a type of carbohydrate. It is found in fruits, vegetables, whole grains, and beans.  There are many health benefits of eating a high-fiber diet, such as preventing constipation, lowering blood cholesterol, helping with weight loss, and reducing your risk of heart disease, diabetes, and certain cancers.  Gradually increase your intake of fiber. Increasing too fast can result in cramping, bloating, and gas. Drink plenty of water  while you increase your fiber.  The best sources of fiber include whole fruits and vegetables, whole grains, nuts, seeds, and beans. This information is not intended to replace advice given to you by your health care provider. Make sure you discuss any questions you have with your health care provider. Document Revised: 07/08/2017 Document Reviewed: 07/08/2017 Elsevier Patient Education  2020 [employer].  Hemorrhoids Hemorrhoids are swollen veins that may develop:  In the butt (rectum). These are called internal hemorrhoids.  Around the opening of the butt (anus). These are called external hemorrhoids. Hemorrhoids can cause pain, itching, or bleeding. Most of the time, they do not cause serious problems. They usually get better with diet changes, lifestyle changes, and other home treatments. What are the causes? This condition may be caused by:  Having trouble pooping (constipation).  Pushing hard (straining) to poop.  Watery poop (diarrhea).  Pregnancy.  Being very overweight (obese).  Sitting for long periods of time.  Heavy lifting or other activity that causes you to strain.  Anal sex.  Riding a bike for a long period of time. What are the signs or symptoms? Symptoms of this condition include:  Pain.  Itching or soreness in the butt.  Bleeding from the butt.  Leaking poop.  Swelling in the area.  One or more lumps around the opening of your butt. How is this diagnosed? A doctor can often diagnose this condition by looking at the affected area. The doctor may also:  Do an exam that involves feeling the area with a gloved hand (digital rectal exam).  Examine the area inside your butt using a small tube (anoscope).  Order blood tests. This may be done if you have lost a lot of blood.  Have you get a test that involves looking inside the colon using a flexible tube with a camera on the end (sigmoidoscopy or colonoscopy). How is this treated? This  condition can usually be treated at home. Your doctor may tell you to change what you eat, make lifestyle changes, or try home treatments. If these do not help, procedures can be done to remove the hemorrhoids or make them smaller. These may involve:  Placing rubber bands at the base of the hemorrhoids to cut off their blood supply.  Injecting medicine into the hemorrhoids to shrink them.  Shining a type of light energy onto the hemorrhoids to cause them to fall off.  Doing surgery to remove the hemorrhoids or cut off their blood supply. Follow these instructions at home: Eating and drinking   Eat foods that have a lot of fiber in them. These include whole grains, beans, nuts, fruits, and vegetables.  Ask your doctor about taking products that have added fiber (fibersupplements).  Reduce the amount of fat in your diet. You can do this by: ? Eating low-fat dairy products. ? Eating less red meat. ? Avoiding processed foods.  Drink enough fluid to keep your pee (urine) pale yellow. Managing pain and swelling   Take a warm-water bath (sitz bath) for 20 minutes to ease pain. Do this 3-4 times a day. You may do this in a bathtub or using a portable sitz bath that fits over the toilet.  If told, put ice on the painful area. It may be helpful to use ice between your warm baths. ? Put ice in a plastic bag. ? Place a towel between your skin and the bag. ? Leave the ice on for 20 minutes, 2-3 times a day. General instructions  Take over-the-counter and prescription medicines only as told by your doctor. ? Medicated creams and medicines may be used as told.  Exercise often. Ask your doctor how much and what kind of exercise is best for you.  Go to the bathroom when you have the urge to poop. Do not wait.  Avoid pushing too hard when you poop.  Keep your butt dry and clean. Use wet toilet paper or moist towelettes after pooping.  Do not sit on the toilet for a long time.  Keep all  follow-up visits as told by your doctor. This is important. Contact a doctor if you:  Have pain and swelling that do not get better with treatment or medicine.  Have trouble pooping.  Cannot poop.  Have pain or swelling outside the area of the hemorrhoids. Get help right away if you have:  Bleeding that will not stop. Summary  Hemorrhoids are swollen veins in the butt or around the opening of the butt.  They can cause pain, itching, or bleeding.  Eat foods that have a lot of fiber in them. These include whole grains, beans, nuts, fruits, and vegetables.  Take a warm-water bath (sitz bath) for 20 minutes to ease pain. Do this 3-4 times a day. This information is not intended to replace advice given to you by your health care provider. Make sure you discuss any questions you have with your health care provider. Document Revised: 09/11/2018 Document Reviewed: 01/23/2018 Elsevier Patient Education  [place].  Diverticulosis  Diverticulosis is a condition that develops when small pouches (diverticula) form in the wall of the large intestine (colon). The colon is where water is absorbed and stool (feces) is formed. The pouches form when the inside layer of the colon pushes through weak spots in the outer layers of the colon. You may have a few pouches or many of them. The pouches usually do not cause problems unless they become inflamed or infected. When this happens, the condition is called diverticulitis. What are the causes? The cause of this condition is not known. What increases the risk? The following factors may make you more likely to develop this condition:  Being older than age 82. Your risk for this condition increases with age. Diverticulosis is rare among people younger than age 53. By age 60, many people have it.  Eating a low-fiber diet.  Having frequent constipation.  Being overweight.  Not getting enough exercise.  Smoking.  Taking over-the-counter  pain medicines, like aspirin and ibuprofen.  Having a family history of diverticulosis. What are the signs or symptoms? In most people, there are no symptoms of this condition. If you do  have symptoms, they may include:  Bloating.  Cramps in the abdomen.  Constipation or diarrhea.  Pain in the lower left side of the abdomen. How is this diagnosed? Because diverticulosis usually has no symptoms, it is most often diagnosed during an exam for other colon problems. The condition may be diagnosed by:  Using a flexible scope to examine the colon (colonoscopy).  Taking an X-ray of the colon after dye has been put into the colon (barium enema).  Having a CT scan. How is this treated? You may not need treatment for this condition. Your health care provider may recommend treatment to prevent problems. You may need treatment if you have symptoms or if you previously had diverticulitis. Treatment may include:  Eating a high-fiber diet.  Taking a fiber supplement.  Taking a live bacteria supplement (probiotic).  Taking medicine to relax your colon. Follow these instructions at home: Medicines  Take over-the-counter and prescription medicines only as told by your health care provider.  If told by your health care provider, take a fiber supplement or probiotic. Constipation prevention Your condition may cause constipation. To prevent or treat constipation, you may need to:  Drink enough fluid to keep your urine pale yellow.  Take over-the-counter or prescription medicines.  Eat foods that are high in fiber, such as beans, whole grains, and fresh fruits and vegetables.  Limit foods that are high in fat and processed sugars, such as fried or sweet foods.  General instructions  Try not to strain when you have a bowel movement.  Keep all follow-up visits as told by your health care provider. This is important. Contact a health care provider if you:  Have pain in your  abdomen.  Have bloating.  Have cramps.  Have not had a bowel movement in 3 days. Get help right away if:  Your pain gets worse.  Your bloating becomes very bad.  You have a fever or chills, and your symptoms suddenly get worse.  You vomit.  You have bowel movements that are bloody or black.  You have bleeding from your rectum. Summary  Diverticulosis is a condition that develops when small pouches (diverticula) form in the wall of the large intestine (colon).  You may have a few pouches or many of them.  This condition is most often diagnosed during an exam for other colon problems.  Treatment may include increasing the fiber in your diet, taking supplements, or taking medicines. This information is not intended to replace advice given to you by your health care provider. Make sure you discuss any questions you have with your health care provider. Document Revised: 04/02/2019 Document Reviewed: 04/02/2019 Elsevier Patient Education  [place].

## 2019-09-24 NOTE — H&P (Signed)
Heidi Lopez is an 74 y.o. female.   Chief Complaint: Patient is here for colonoscopy. HPI: Patient is 74 year old Caucasian female whose last colonoscopy was in May 2014 revealing mild sigmoid colon diverticulosis and external hemorrhoids. For the past few months she has noted fresh blood with her bowel movements. She says in the last 3 to 4 months she has had 5 episodes. She denies diarrhea constipation abdominal pain anorexia or weight loss. Hemoglobin on June 04, 2019 was 14.3. Patient is on Eliquis for history of atrial fibrillation. Eliquis is on hold. Family history significant for CRC in her father who was in his 64s at the time of diagnosis. Her son was diagnosed with colon carcinoma by me in October 2019 when he presented with rectal bleeding or mass. Patient tells me that her son's paternal uncle also has colon carcinoma. Her son had chemoradiation followed by surgery by Dr. Cheryll Cockayne and he is doing well.  Past Medical History:  Diagnosis Date  . Breast cancer ([place])   . Cancer of breast [facility]) July 2010   s/p XRT 2010 and again in 2011  . Chronic coughing   . Cramps, muscle, general    severe breast cramping   . Dysrhythmia    atrial flutter  . Glaucoma   . Glaucoma    left eye   . Joint pain    foot and knee pain  . Lymphedema of arm    left  . Night sweats   . Osteopenia 01/24/2015  . Pain    breast pain  . Paroxysmal atrial fibrillation ([place]) 05/2014  . Sinus problem   . Wears glasses     Past Surgical History:  Procedure Laterality Date  . BREAST SURGERY  2010- right and 2011-left   for cancer  . CARDIOVERSION N/A 10/21/2014   Procedure: CARDIOVERSION;  Surgeon: [name], MD;  Location: [place];  Service: Cardiovascular;  Laterality: N/A;  . COLONOSCOPY N/A 02/05/2013   Procedure: COLONOSCOPY;  Surgeon: [name], MD;  [facility];  Service: Endoscopy;  Laterality: N/A;  830  . ELECTROPHYSIOLOGIC STUDY N/A 02/24/2015   PVI  and CTI ablation by Dr [name]  . EP IMPLANTABLE DEVICE N/A 08/24/2015   Procedure: Loop Recorder Insertion;  Surgeon: Thompson Grayer, MD;  Location: [facility];  Service: Cardiovascular;  Laterality: N/A;  . HYSTEROSCOPY WITH D & C N/A 12/29/2014   Procedure: DILATATION AND CURETTAGE /HYSTEROSCOPY;  Surgeon: [name], MD;  Location: [facility];  Service: Gynecology;  Laterality: N/A;  . Implantable loop recorder removal  04/29/2019   MDT Reveal LINQ removed by Dr [name] in office  . TEE WITHOUT CARDIOVERSION N/A 02/24/2015   Procedure: TRANSESOPHAGEAL ECHOCARDIOGRAM (TEE);  Surgeon: [name], MD;  Location: [facility] ENDOSCOPY;  Service: Cardiovascular;  Laterality: N/A;  . TUBAL LIGATION  1984    Family History  Problem Relation Age of Onset  . Heart disease Mother   . Stroke Father   . Cancer Father   . Colon cancer Father   . Early death Brother   . Cancer Sister   . Cancer Sister   . Cancer Sister   . Diabetes Daughter   . Heart disease Son   . Colon polyps Son   . Breast cancer Neg Hx    Social History:  reports that she quit smoking about 52 years ago. Her smoking use included cigarettes. She started smoking about 57 years ago. She has a 2.00  pack-year smoking history. She has never used smokeless tobacco. She reports that she does not drink alcohol or use drugs.  Allergies:  Allergies  Allergen Reactions  . Metoprolol     Makes her feel bad  . Vesicare [Solifenacin]     Severe hypertension and tachycardia   . Reglan [Metoclopramide] Other (See Comments)    Causes severe jitters    Medications Prior to Admission  Medication Sig Dispense Refill  . brimonidine (ALPHAGAN) 0.2 % ophthalmic solution Place 1 drop into the left eye 2 (two) times daily.     . Calcium Carbonate-Vit D-Min (CALTRATE 600+D PLUS PO) Take 1 tablet by mouth daily.    . Cholecalciferol (VITAMIN D PO) Take 2,000 Units by mouth daily.     . Coenzyme Q10 (COQ-10) 100 MG CAPS Take 100 mg by mouth  daily.     . Cranberry 500 MG CAPS Take 500 mg by mouth daily.     Marland Kitchen diltiazem (CARDIZEM) 30 MG tablet TAKE ONE TABLET BY MOUTH EVERY 4 HOURS AS NEEDED FOR HEART RATE >100 AS LONG AS BLOOD PRESSURE >100 (Patient taking differently: Take 30 mg by mouth every 4 (four) hours as needed (increase heart rate and blood pressure). Heart rate>100 as long as blood pressure >100) 45 tablet 3  . diltiazem (CARTIA XT) 180 MG 24 hr capsule Take 1 capsule (180 mg total) by mouth daily. 90 capsule 3  . furosemide (LASIX) 20 MG tablet Take 1 tablet (20 mg total) by mouth daily as needed. Take daily as needed for swelling 90 tablet 3  . letrozole (FEMARA) 2.5 MG tablet Take 1 tablet by mouth once daily (Patient taking differently: Take 2.5 mg by mouth daily. ) 90 tablet 3  . LUMIGAN 0.01 % SOLN Place 1 drop into both eyes at bedtime.    Marland Kitchen MAGNESIUM PO Take 400 mg by mouth daily.    . Misc Natural Products (JOINT SUPPORT COMPLEX PO) Take 40 mg by mouth daily. Per pt she takes an over the counter medication daily for joint pain pt unsure of dosage.  Collagen support UC-II Type II Collagen    . Multiple Vitamin (MULTIVITAMIN) tablet Take 1 tablet by mouth daily.     . Omega-3 Fatty Acids (FISH OIL) 1200 MG CAPS Take 1,200 mg by mouth daily.     Marland Kitchen SOTALOL AF 80 MG TABS Take 1 tablet (80 mg total) by mouth 2 (two) times daily. (Patient taking differently: Take 80 mg by mouth 2 (two) times daily. ) 180 tablet 1  . albuterol (PROVENTIL HFA;VENTOLIN HFA) 108 (90 BASE) MCG/ACT inhaler Inhale into the lungs every 6 (six) hours as needed for wheezing or shortness of breath. Reported on 11/28/2015    . ELIQUIS 5 MG TABS tablet Take 1 tablet by mouth twice daily 180 tablet 2  . fluticasone (FLONASE) 50 MCG/ACT nasal spray Place 1 spray into both nostrils daily as needed for rhinitis.     Marland Kitchen loratadine (CLARITIN) 10 MG tablet Take 10 mg by mouth daily as needed for allergies. Reported on 09/20/2015      No results found for this or  any previous visit (from the past 48 hour(s)). No results found.  Review of Systems  Blood pressure (!) 158/93, pulse (!) 42, temperature 97.6 F (36.4 C), temperature source Oral, resp. rate 12, height 5\' 3"  (1.6 m), SpO2 96 %. Physical Exam  Constitutional: She appears well-developed and well-nourished.  HENT:  Mouth/Throat: Oropharynx is clear and moist.  Eyes:  Conjunctivae are normal. No scleral icterus.  Neck: No thyromegaly present.  Cardiovascular:  Irregular rhythm normal S1 and S2. No murmur gallop noted.  Respiratory: Effort normal and breath sounds normal.  GI:  Abdomen is full but soft and nontender with no organomegaly or masses.  Musculoskeletal:        General: No edema.  Lymphadenopathy:    She has no cervical adenopathy.  Neurological: She is alert.  Skin: Skin is warm and dry.     Assessment/Plan Rectal bleeding. Family history of CRC in father at late onset and her son at age 67. Diagnostic colonoscopy.  Hildred Laser, MD 09/24/2019, 11:16 AM

## 2019-09-24 NOTE — Op Note (Signed)
[facility] Patient Name: Heidi Lopez Procedure Date: 09/24/2019 11:09 AM MRN: [id number] Date of Birth: 24-Jan-1946 Attending MD: Hildred Laser , MD CSN: [id number] Age: 74 Admit Type: Outpatient Procedure:                Colonoscopy Indications:              Hematochezia. Providers:                Hildred Laser, MD, Jeanann Lewandowsky. Sharon Seller, RN, Nelma Rothman, Technician Referring MD:             [name] Medicines:                Meperidine 50 mg IV, Midazolam 5 mg IV Complications:            No immediate complications. Estimated Blood Loss:     Estimated blood loss: none. Procedure:                Pre-Anesthesia Assessment:                           - Prior to the procedure, a History and Physical                            was performed, and patient medications and                            allergies were reviewed. The patient's tolerance of                            previous anesthesia was also reviewed. The risks                            and benefits of the procedure and the sedation                            options and risks were discussed with the patient.                            All questions were answered, and informed consent                            was obtained. Prior Anticoagulants: The patient                            last took Eliquis (apixaban) 3 days prior to the                            procedure. ASA Grade Assessment: II - A patient                            with mild systemic disease. After reviewing the  risks and benefits, the patient was deemed in                            satisfactory condition to undergo the procedure.                           After obtaining informed consent, the colonoscope                            was passed under direct vision. Throughout the                            procedure, the patient's blood pressure, pulse, and                            oxygen saturations were  monitored continuously. The                            [id number]) scope was introduced through                            the anus and advanced to the the terminal ileum,                            with identification of the appendiceal orifice and                            IC valve. The terminal ileum, ileocecal valve,                            appendiceal orifice, and rectum were photographed.                            The colonoscopy was performed without difficulty.                            The patient tolerated the procedure well. The                            quality of the bowel preparation was excellent. Scope In: 11:25:49 AM Scope Out: [id number] AM Scope Withdrawal Time: 0 hours 8 minutes 56 seconds  Total Procedure Duration: 0 hours 21 minutes 29 seconds  Findings:      The perianal and digital rectal examinations were normal.      The terminal ileum appeared normal.      Scattered diverticula were found in the sigmoid colon.      External hemorrhoids were found during retroflexion. The hemorrhoids       were small. Impression:               - The examined portion of the ileum was normal.                           - Diverticulosis in the sigmoid colon.                           -  External hemorrhoids.                           - No specimens collected. Moderate Sedation:      Moderate (conscious) sedation was administered by the endoscopy nurse       and supervised by the endoscopist. The following parameters were       monitored: oxygen saturation, heart rate, blood pressure, CO2       capnography and response to care. Total physician intraservice time was       25 minutes. Recommendation:           - Patient has a contact number available for                            emergencies. The signs and symptoms of potential                            delayed complications were discussed with the                            patient. Return to normal activities  tomorrow.                            Written discharge instructions were provided to the                            patient.                           - High fiber diet today.                           - Continue present medications.                           - Resume Eliquis (apixaban) at prior dose today.                           - Repeat colonoscopy in 5 years for screening                            purposes.                           - Progress report in 2 to 3 months. Procedure Code(s):        --- Professional ---                           [phone], Colonoscopy, flexible; diagnostic, including                            collection of specimen(s) by brushing or washing,                            when performed (separate procedure)  [id number], Moderate sedation; each additional 15                            minutes intraservice time                           G0500, Moderate sedation services provided by the                            same physician or other qualified health care                            professional performing a gastrointestinal                            endoscopic service that sedation supports,                            requiring the presence of an independent trained                            observer to assist in the monitoring of the                            patient's level of consciousness and physiological                            status; initial 15 minutes of intra-service time;                            patient age 57 years or older (additional time may                            be reported with [phone], as appropriate) Diagnosis Code(s):        --- Professional ---                           K64.4, Residual hemorrhoidal skin tags                           K92.1, Melena (includes Hematochezia)                           K57.30, Diverticulosis of large intestine without                            perforation or abscess without  bleeding CPT copyright 2019 [employer]. All rights reserved. The codes documented in this report are preliminary and upon coder review may  be revised to meet current compliance requirements. Hildred Laser, MD Hildred Laser, MD 09/24/2019 11:58:32 AM This report has been signed electronically. Number of Addenda: 0

## 2019-10-23 DIAGNOSIS — Z23 Encounter for immunization: Secondary | ICD-10-CM | POA: Diagnosis not present

## 2019-10-30 ENCOUNTER — Ambulatory Visit (INDEPENDENT_AMBULATORY_CARE_PROVIDER_SITE_OTHER): Payer: Medicare Other | Admitting: Physician Assistant

## 2019-10-30 ENCOUNTER — Other Ambulatory Visit: Payer: Self-pay

## 2019-10-30 VITALS — BP 131/82 | HR 94 | Ht 63.0 in | Wt 210.0 lb

## 2019-10-30 DIAGNOSIS — R079 Chest pain, unspecified: Secondary | ICD-10-CM | POA: Diagnosis not present

## 2019-10-30 DIAGNOSIS — I480 Paroxysmal atrial fibrillation: Secondary | ICD-10-CM

## 2019-10-30 DIAGNOSIS — Z79899 Other long term (current) drug therapy: Secondary | ICD-10-CM

## 2019-10-30 DIAGNOSIS — I10 Essential (primary) hypertension: Secondary | ICD-10-CM

## 2019-10-30 NOTE — Patient Instructions (Addendum)
Medication Instructions:   Your physician recommends that you continue on your current medications as directed. Please refer to the Current Medication list given to you today.\  *If you need a refill on your cardiac medications before your next appointment, please call your pharmacy*  Lab Work: NONE ORDERED  TODAY   If you have labs (blood work) drawn today and your tests are completely normal, you will receive your results only by: Marland Kitchen MyChart Message (if you have MyChart) OR . A paper copy in the mail If you have any lab test that is abnormal or we need to change your treatment, we will call you to review the results.  Testing/Procedures:  NONE ORDERED  TODAY    Follow-Up: At [facility], you and your health needs are our priority.  As part of our continuing mission to provide you with exceptional heart care, we have created designated Provider Care Teams.  These Care Teams include your primary Cardiologist (physician) and Advanced Practice Providers (APPs -  Physician Assistants and Nurse Practitioners) who all work together to provide you with the care you need, when you need it.  Your next appointment:    3 month(s)  The format for your next appointment:   In Person  Provider:   You may see Thompson Grayer, MD or one of the following Advanced Practice Providers on your designated Care Team:    Chanetta Marshall, NP  Tommye Standard, [place]  Legrand Como "Jonni Sanger" Alexander, [place]   Other Instructions:  KEEP A LOG OF BLOOD PRESSURE

## 2019-10-30 NOTE — Progress Notes (Signed)
Cardiology Office Note Date:  10/30/2019  Patient ID:  Heidi Lopez 04-22-1946, MRN [id number] PCP:  Heidi Noble, MD  Cardiologist:  Dr. Harl Bowie Electrophysiologist: Dr. [name]   Chief Complaint:   History of Present Illness: Heidi Lopez is a 74 y.o. female with history of glaucoma, breast ca tx w/surgery and XRT 2010/2011, and PAFib/flutter s/p PVI/CTI ablation 2016 underwent loop implant 08/2015 (has since reached EOS), HTN  At her Oct 2018,visit her AF burden was up to 28.9 % from 16%.  Dr. [name] discussed concerns this would continue to rise and suggested up-titration of her sotalol and/or repeat ablation, the patient declined both.  Also noted was hx of post-termination pauses historically though the patient felt symptoms improved and did not want to pursue pacing.   He most recently saw her Aug 2020, at that visit her ILR was noted EOS and was removed.  She was doing well, had not had symptoms of bradycardia., no changes were made to her therapy.  She reports feeling well.  She has noted her BP unusually high sometimes, generally 110's/70's intermittently 140's100.  Her HR generally 70's-90's, always <100, no overt sense of her Afib or palpitations.  She has a chronic intermittent cough for years, no symptoms of illness.  Her brother died of COVID 1/13/, a night or two later at rest in bed she had central chest disconfort, perhaps burning that was associated with L arm aching, no other symptoms, this struck her as indigestion, and when she woke was gone. She reports similar symptoms in the past perhaps for years.  She has known GERD and reports carafate seems to help.  No dizzy spells,  Near syncope or syncope. She does not formally exercise, and in fact more sedentary then usual in the past year, denies any DOE, no symptoms or PND or orthopnea, no changes in her exertional capacity.  She had noted some bleeding w/BM, had a colonoscopy 09/24/2019, noted with hemorrhoids, resumed  her Eliquis same day, no bleeding since   Afib hx  Diagnosed 2015 PVI/CTI ablation 02/24/2015 AAD hx Failed flecainide (pre-ablation) Recurrent AFib/atypical Aflutter post ablation started on sotalol 10/17/2015  Device information: MDT ILR, implanted 08/24/2015, AFib management, Dr. [name] EOS and removed 04/29/2019   Past Medical History:  Diagnosis Date  . Breast cancer ([place])   . Cancer of breast [facility]) July 2010   s/p XRT 2010 and again in 2011  . Chronic coughing   . Cramps, muscle, general    severe breast cramping   . Dysrhythmia    atrial flutter  . Glaucoma   . Glaucoma    left eye   . Joint pain    foot and knee pain  . Lymphedema of arm    left  . Night sweats   . Osteopenia 01/24/2015  . Pain    breast pain  . Paroxysmal atrial fibrillation ([place]) 05/2014  . Sinus problem   . Wears glasses     Past Surgical History:  Procedure Laterality Date  . BREAST SURGERY  2010- right and 2011-left   for cancer  . CARDIOVERSION N/A 10/21/2014   Procedure: CARDIOVERSION;  Surgeon: [name], MD;  Location: [facility];  Service: Cardiovascular;  Laterality: N/A;  . COLONOSCOPY N/A 02/05/2013   Procedure: COLONOSCOPY;  Surgeon: [name], MD;  [facility];  Service: Endoscopy;  Laterality: N/A;  830  . COLONOSCOPY N/A 09/24/2019   Procedure: COLONOSCOPY;  Surgeon: [name], MD;  [facility];  Service: Endoscopy;  Laterality: N/A;  100pm  . ELECTROPHYSIOLOGIC STUDY N/A 02/24/2015   PVI and CTI ablation by Dr [name]  . EP IMPLANTABLE DEVICE N/A 08/24/2015   Procedure: Loop Recorder Insertion;  Surgeon: Thompson Grayer, MD;  Location: [facility] CV LAB;  Service: Cardiovascular;  Laterality: N/A;  . HYSTEROSCOPY WITH D & C N/A 12/29/2014   Procedure: DILATATION AND CURETTAGE /HYSTEROSCOPY;  Surgeon: [name], MD;  Location: [facility];  Service: Gynecology;  Laterality: N/A;  . Implantable loop recorder removal  04/29/2019   MDT Reveal  LINQ removed by Dr [name] in office  . TEE WITHOUT CARDIOVERSION N/A 02/24/2015   Procedure: TRANSESOPHAGEAL ECHOCARDIOGRAM (TEE);  Surgeon: [name], MD;  Location: [facility] ENDOSCOPY;  Service: Cardiovascular;  Laterality: N/A;  . TUBAL LIGATION  1984    Current Outpatient Medications  Medication Sig Dispense Refill  . albuterol (PROVENTIL HFA;VENTOLIN HFA) 108 (90 BASE) MCG/ACT inhaler Inhale into the lungs every 6 (six) hours as needed for wheezing or shortness of breath. Reported on 11/28/2015    . brimonidine (ALPHAGAN) 0.2 % ophthalmic solution Place 1 drop into the left eye 2 (two) times daily.     . Calcium Carbonate-Vit D-Min (CALTRATE 600+D PLUS PO) Take 1 tablet by mouth daily.    . Cholecalciferol (VITAMIN D PO) Take 2,000 Units by mouth daily.     . Coenzyme Q10 (COQ-10) 100 MG CAPS Take 100 mg by mouth daily.     . Cranberry 500 MG CAPS Take 500 mg by mouth daily.     Marland Kitchen diltiazem (CARDIZEM) 30 MG tablet TAKE ONE TABLET BY MOUTH EVERY 4 HOURS AS NEEDED FOR HEART RATE >100 AS LONG AS BLOOD PRESSURE >100 (Patient taking differently: Take 30 mg by mouth every 4 (four) hours as needed (increase heart rate and blood pressure). Heart rate>100 as long as blood pressure >100) 45 tablet 3  . diltiazem (CARTIA XT) 180 MG 24 hr capsule Take 1 capsule (180 mg total) by mouth daily. 90 capsule 3  . ELIQUIS 5 MG TABS tablet Take 1 tablet by mouth twice daily 180 tablet 2  . fluticasone (FLONASE) 50 MCG/ACT nasal spray Place 1 spray into both nostrils daily as needed for rhinitis.     . furosemide (LASIX) 20 MG tablet Take 1 tablet (20 mg total) by mouth daily as needed. Take daily as needed for swelling 90 tablet 3  . hydrocortisone (ANUSOL-HC) 25 MG suppository Place 1 suppository (25 mg total) rectally at bedtime. 14 suppository 1  . letrozole (FEMARA) 2.5 MG tablet Take 1 tablet by mouth once daily (Patient taking differently: Take 2.5 mg by mouth daily. ) 90 tablet 3  . loratadine (CLARITIN) 10  MG tablet Take 10 mg by mouth daily as needed for allergies. Reported on 09/20/2015    . LUMIGAN 0.01 % SOLN Place 1 drop into both eyes at bedtime.    Marland Kitchen MAGNESIUM PO Take 400 mg by mouth daily.    . Misc Natural Products (JOINT SUPPORT COMPLEX PO) Take 40 mg by mouth daily. Per pt she takes an over the counter medication daily for joint pain pt unsure of dosage.  Collagen support UC-II Type II Collagen    . Multiple Vitamin (MULTIVITAMIN) tablet Take 1 tablet by mouth daily.     . Omega-3 Fatty Acids (FISH OIL) 1200 MG CAPS Take 1,200 mg by mouth daily.     . psyllium (METAMUCIL SMOOTH TEXTURE) 58.6 % powder  Take 1 packet by mouth daily.    Marland Kitchen SOTALOL AF 80 MG TABS Take 1 tablet (80 mg total) by mouth 2 (two) times daily. (Patient taking differently: Take 80 mg by mouth 2 (two) times daily. ) 180 tablet 1   No current facility-administered medications for this visit.    Allergies:   Metoprolol, Vesicare [solifenacin], and Reglan [metoclopramide]   Social History:  The patient  reports that she quit smoking about 52 years ago. Her smoking use included cigarettes. She started smoking about 57 years ago. She has a 2.00 pack-year smoking history. She has never used smokeless tobacco. She reports that she does not drink alcohol or use drugs.   Family History:  The patient's family history includes Cancer in her father, sister, sister, and sister; Colon cancer in her father; Colon polyps in her son; Diabetes in her daughter; Early death in her brother; Heart disease in her mother and son; Stroke in her father.  ROS:  Please see the history of present illness.  All other systems are reviewed and otherwise negative.   PHYSICAL EXAM: VS:  There were no vitals taken for this visit. BMI: There is no height or weight on file to calculate BMI. Well nourished, well developed, in no acute distress  HEENT: normocephalic, atraumatic  Neck: no JVD, carotid bruits or masses Cardiac:  irreg-irreg; no  significant murmurs, no rubs, or gallops Lungs:  CTA b/l, no wheezing, rhonchi or rales  Abd: soft, nontender MS: no deformity or atrophy Ext:  trace edema  Skin: warm and dry, no rash Neuro:  No gross deficits appreciated Psych: euthymic mood, full affect     EKG: done today and reviewed by myself: AFib 97bpm, measured QT 360-338ms, Qtc 458-437ms    02/24/15: TEE Left ventricle:  LVEF is normal. ------------------------------------------------------------------- Aortic valve:  AV is normal NO AI> ------------------------------------------------------------------- Mitral valve:  MV is normal Mild MR. ------------------------------------------------------------------- Left atrium:   No evidence of thrombus in the atrial cavity or appendage.  No evidence of thrombus in the atrial cavity or appendage. ------------------------------------------------------------------ Atrial septum:  No obvious PFO by color doppler ------------------------------------------------------------------- Pulmonic valve:   PV is normal. ------------------------------------------------------------------- Tricuspid valve:  TV is normal Mild TR.     Recent Labs: 12/02/2018: Magnesium 2.2 06/04/2019: ALT 28; BUN 14; Creatinine, Ser 0.75; Hemoglobin 14.3; Platelets 240; Potassium 4.4; Sodium 134  No results found for requested labs within last 8760 hours.   CrCl cannot be calculated (Patient's most recent lab result is older than the maximum 21 days allowed.).   Wt Readings from Last 3 Encounters:  08/06/19 205 lb 1.6 oz (93 kg)  06/11/19 203 lb (92.1 kg)  04/29/19 193 lb (87.5 kg)     Other studies reviewed: Additional studies/records reviewed today include: summarized above  ASSESSMENT AND PLAN:  1. Paroxysmal AFib/flutter     CHA2DS2Vasc is 3, on Eliquis, appropriately dosed     On Sotalol     Qt stable      She is in AFib today and asymptomatic, unknown for how long She has had  intermittent AFib all along it seems on the sotalol in review of her last loop interrogations prior to explantl, she is not unhappy with how things have been or how she feels controlled and has not wanted to consider repeat ablation.  Mentions her AFib seemed to her to be worse     2. Hx of post termination pauses     Patient has historically not wanted to  pursue pacing     No symptoms of bradycardia  3. HTN     Looks OK, no changes     Some higher numbers at ome, increased personal stress the last month or so  4. CP    She suspects this is indigestion    Discussed getting a stress test to evaluate possible cardiac cause, though she prefers to hold off. She will reach out to her PMD for GERD management.     This has not been recurrent and sounded somewhat atypical    Disposition: Would like to update her BMET and mag level given her sotalol, she asks if we can reach out to her oncology team to add on to their labs scheduled for next month.   We will add on to her lab day/draw our order as well.  Otherwise will have her back in 3-68mo to f/u on symptoms.   Current medicines are reviewed at length with the patient today.  The patient did not have any concerns regarding medicines.  Venetia Night, PA-C 10/30/2019 5:39 AM     [place] [phone] (office)  [phone] (fax)

## 2019-11-21 DIAGNOSIS — Z23 Encounter for immunization: Secondary | ICD-10-CM | POA: Diagnosis not present

## 2019-12-02 ENCOUNTER — Other Ambulatory Visit (HOSPITAL_COMMUNITY): Payer: Self-pay | Admitting: *Deleted

## 2019-12-02 DIAGNOSIS — Z853 Personal history of malignant neoplasm of breast: Secondary | ICD-10-CM

## 2019-12-03 ENCOUNTER — Inpatient Hospital Stay (HOSPITAL_COMMUNITY): Payer: Medicare Other | Attending: Hematology

## 2019-12-03 ENCOUNTER — Other Ambulatory Visit: Payer: Self-pay

## 2019-12-03 DIAGNOSIS — C50912 Malignant neoplasm of unspecified site of left female breast: Secondary | ICD-10-CM | POA: Diagnosis not present

## 2019-12-03 DIAGNOSIS — Z87891 Personal history of nicotine dependence: Secondary | ICD-10-CM | POA: Insufficient documentation

## 2019-12-03 DIAGNOSIS — I480 Paroxysmal atrial fibrillation: Secondary | ICD-10-CM | POA: Insufficient documentation

## 2019-12-03 DIAGNOSIS — Z7901 Long term (current) use of anticoagulants: Secondary | ICD-10-CM | POA: Diagnosis not present

## 2019-12-03 DIAGNOSIS — M8580 Other specified disorders of bone density and structure, unspecified site: Secondary | ICD-10-CM | POA: Diagnosis not present

## 2019-12-03 DIAGNOSIS — H409 Unspecified glaucoma: Secondary | ICD-10-CM | POA: Insufficient documentation

## 2019-12-03 DIAGNOSIS — Z79899 Other long term (current) drug therapy: Secondary | ICD-10-CM | POA: Diagnosis not present

## 2019-12-03 DIAGNOSIS — Z170 Estrogen receptor positive status [ER+]: Secondary | ICD-10-CM | POA: Insufficient documentation

## 2019-12-03 DIAGNOSIS — C50911 Malignant neoplasm of unspecified site of right female breast: Secondary | ICD-10-CM | POA: Insufficient documentation

## 2019-12-03 DIAGNOSIS — Z853 Personal history of malignant neoplasm of breast: Secondary | ICD-10-CM

## 2019-12-03 DIAGNOSIS — Z79811 Long term (current) use of aromatase inhibitors: Secondary | ICD-10-CM | POA: Diagnosis not present

## 2019-12-03 LAB — COMPREHENSIVE METABOLIC PANEL
ALT: 47 U/L — ABNORMAL HIGH (ref 0–44)
AST: 38 U/L (ref 15–41)
Albumin: 4 g/dL (ref 3.5–5.0)
Alkaline Phosphatase: 73 U/L (ref 38–126)
Anion gap: 10 (ref 5–15)
BUN: 13 mg/dL (ref 8–23)
CO2: 23 mmol/L (ref 22–32)
Calcium: 9.1 mg/dL (ref 8.9–10.3)
Chloride: 104 mmol/L (ref 98–111)
Creatinine, Ser: 0.68 mg/dL (ref 0.44–1.00)
GFR calc Af Amer: >60 mL/min (ref >60)
GFR calc non Af Amer: >60 mL/min (ref >60)
Glucose, Bld: 114 mg/dL — ABNORMAL HIGH (ref 70–99)
Potassium: 4.5 mmol/L (ref 3.5–5.1)
Sodium: 137 mmol/L (ref 135–145)
Total Bilirubin: 0.5 mg/dL (ref 0.3–1.2)
Total Protein: 6.8 g/dL (ref 6.5–8.1)

## 2019-12-03 LAB — CBC WITH DIFFERENTIAL/PLATELET
Abs Immature Granulocytes: 0.04 10*3/uL (ref 0.00–0.07)
Basophils Absolute: 0.1 10*3/uL (ref 0.0–0.1)
Basophils Relative: 1 %
Eosinophils Absolute: 0.2 10*3/uL (ref 0.0–0.5)
Eosinophils Relative: 2 %
HCT: 41.9 % (ref 36.0–46.0)
Hemoglobin: 13.7 g/dL (ref 12.0–15.0)
Immature Granulocytes: 1 %
Lymphocytes Relative: 30 %
Lymphs Abs: 2.7 10*3/uL (ref 0.7–4.0)
MCH: 30.7 pg (ref 26.0–34.0)
MCHC: 32.7 g/dL (ref 30.0–36.0)
MCV: 93.9 fL (ref 80.0–100.0)
Monocytes Absolute: 1 10*3/uL (ref 0.1–1.0)
Monocytes Relative: 11 %
Neutro Abs: 4.8 10*3/uL (ref 1.7–7.7)
Neutrophils Relative %: 55 %
Platelets: 221 10*3/uL (ref 150–400)
RBC: 4.46 MIL/uL (ref 3.87–5.11)
RDW: 12.4 % (ref 11.5–15.5)
WBC: 8.8 10*3/uL (ref 4.0–10.5)
nRBC: 0 % (ref 0.0–0.2)

## 2019-12-03 LAB — VITAMIN D 25 HYDROXY (VIT D DEFICIENCY, FRACTURES): Vit D, 25-Hydroxy: 36.58 ng/mL (ref 30–100)

## 2019-12-10 ENCOUNTER — Other Ambulatory Visit: Payer: Self-pay

## 2019-12-10 ENCOUNTER — Encounter (HOSPITAL_COMMUNITY): Payer: Self-pay | Admitting: Nurse Practitioner

## 2019-12-10 ENCOUNTER — Inpatient Hospital Stay (HOSPITAL_BASED_OUTPATIENT_CLINIC_OR_DEPARTMENT_OTHER): Payer: Medicare Other | Admitting: Nurse Practitioner

## 2019-12-10 VITALS — BP 133/85 | HR 55 | Temp 97.6°F | Resp 18 | Wt 210.0 lb

## 2019-12-10 DIAGNOSIS — Z1231 Encounter for screening mammogram for malignant neoplasm of breast: Secondary | ICD-10-CM

## 2019-12-10 DIAGNOSIS — M810 Age-related osteoporosis without current pathological fracture: Secondary | ICD-10-CM | POA: Diagnosis not present

## 2019-12-10 DIAGNOSIS — Z853 Personal history of malignant neoplasm of breast: Secondary | ICD-10-CM | POA: Diagnosis not present

## 2019-12-10 DIAGNOSIS — M8580 Other specified disorders of bone density and structure, unspecified site: Secondary | ICD-10-CM | POA: Diagnosis not present

## 2019-12-10 DIAGNOSIS — I480 Paroxysmal atrial fibrillation: Secondary | ICD-10-CM | POA: Diagnosis not present

## 2019-12-10 DIAGNOSIS — C50911 Malignant neoplasm of unspecified site of right female breast: Secondary | ICD-10-CM | POA: Diagnosis not present

## 2019-12-10 DIAGNOSIS — Z170 Estrogen receptor positive status [ER+]: Secondary | ICD-10-CM | POA: Diagnosis not present

## 2019-12-10 DIAGNOSIS — C50912 Malignant neoplasm of unspecified site of left female breast: Secondary | ICD-10-CM | POA: Diagnosis not present

## 2019-12-10 DIAGNOSIS — H409 Unspecified glaucoma: Secondary | ICD-10-CM | POA: Diagnosis not present

## 2019-12-10 NOTE — Patient Instructions (Signed)
[facility] at [facility] Discharge Instructions  Follow up in 6 months with labs and mammogram   Thank you for choosing [facility] at [facility] to provide your oncology and hematology care.  To afford each patient quality time with our provider, please arrive at least 15 minutes before your scheduled appointment time.   If you have a lab appointment with the Cancer Center please come in thru the Main Entrance and check in at the main information desk.  You need to re-schedule your appointment should you arrive 10 or more minutes late.  We strive to give you quality time with our providers, and arriving late affects you and other patients whose appointments are after yours.  Also, if you no show three or more times for appointments you may be dismissed from the clinic at the providers discretion.     Again, thank you for choosing [facility].  Our hope is that these requests will decrease the amount of time that you wait before being seen by our physicians.       _____________________________________________________________  Should you have questions after your visit to [facility], please contact our office at [phone] between the hours of 8:00 a.m. and 4:30 p.m.  Voicemails left after 4:00 p.m. will not be returned until the following business day.  For prescription refill requests, have your pharmacy contact our office and allow 72 hours.    Due to Covid, you will need to wear a mask upon entering the hospital. If you do not have a mask, a mask will be given to you at the Main Entrance upon arrival. For doctor visits, patients may have 1 support person with them. For treatment visits, patients can not have anyone with them due to social distancing guidelines and our immunocompromised population.      

## 2019-12-10 NOTE — Assessment & Plan Note (Signed)
1. Right breast infiltrating lobular carcinoma: -Status post right lumpectomy and lymph node biopsy on 04/14/2009, PT1BN0, ER/PR positive, Ki 67 8%, margins negative, grade 1, 0.6 cm. -Patient was recommended tamoxifen which she took for approximately 4 years.  This was discontinued secondary to endometrial hyperplasia. -Breast cancer index showed high likelihood of benefit. -She was switched to anastrozole which she could not tolerate secondary due to musculoskeletal symptoms. -She was then switched to Femara which she has been on for the past 6 years with no problems.  She will continue for a total of 10 years.  Patient is wanting to stay on Femara lifelong.  It was explained to patient that there is no benefit after 10 years. -Her last mammogram was on 06/08/2019 which was BI-RADS Category 1 negative. -Physical examination not reveal any concerning findings. -Labs done on 12/03/2019 were all WNL. -She will follow-up in 6 months with repeat mammogram and labs.  2. Left breast IDC: -She had a left breast lumpectomy and lymph node biopsy on 04/26/2010.  Pathology showed 1.7 cm, grade 1, with low-grade DCIS, margins negative, PT1CPN0.  Ki-67 was 9%.  ER/PR positive and HER-2 negative. -Bilateral lumpectomy sites within normal limits.  Bilateral breast had no palpable masses.  3.Osteopropenia: -DEXA scan done on 11/27/2017 showed T score of -1.9.  This was improved from 2018 where her T score was -2.3. -She was offered Prolia but refused given side effects. -She is taking calcium and vitamin D daily.  She takes approximately 3000 units daily. -We will repeat a DEXA scan at her next visit.

## 2019-12-10 NOTE — Progress Notes (Signed)
[place], [place] [address]   CLINIC:  Medical Oncology/Hematology  PCP:  [name], MD [address] [place] [address] [phone]   REASON FOR VISIT: Follow-up for breast cancer  CURRENT THERAPY: Femara   INTERVAL HISTORY:  Heidi Lopez 74 y.o. female returns for routine follow-up for breast cancer.  Patient reports she is doing well since her last visit.  She denies any new lumps or bumps present.  She denies any new bone pain. Denies any nausea, vomiting, or diarrhea. Denies any new pains. Had not noticed any recent bleeding such as epistaxis, hematuria or hematochezia. Denies recent chest pain on exertion, shortness of breath on minimal exertion, pre-syncopal episodes, or palpitations. Denies any numbness or tingling in hands or feet. Denies any recent fevers, infections, or recent hospitalizations. Patient reports appetite at 100% and energy level at 100%.  She is eating well maintain her weight at this time.     REVIEW OF SYSTEMS:  Review of Systems  All other systems reviewed and are negative.    PAST MEDICAL/SURGICAL HISTORY:  Past Medical History:  Diagnosis Date  . Breast cancer ([place])   . Cancer of breast [facility]) July 2010   s/p XRT 2010 and again in 2011  . Chronic coughing   . Cramps, muscle, general    severe breast cramping   . Dysrhythmia    atrial flutter  . Glaucoma   . Glaucoma    left eye   . Joint pain    foot and knee pain  . Lymphedema of arm    left  . Night sweats   . Osteopenia 01/24/2015  . Pain    breast pain  . Paroxysmal atrial fibrillation ([place]) 05/2014  . Sinus problem   . Wears glasses    Past Surgical History:  Procedure Laterality Date  . BREAST SURGERY  2010- right and 2011-left   for cancer  . CARDIOVERSION N/A 10/21/2014   Procedure: CARDIOVERSION;  Surgeon: [name], MD;  Location: [facility];  Service: Cardiovascular;  Laterality: N/A;  . COLONOSCOPY N/A  02/05/2013   Procedure: COLONOSCOPY;  Surgeon: [name], MD;  [facility];  Service: Endoscopy;  Laterality: N/A;  830  . COLONOSCOPY N/A 09/24/2019   Procedure: COLONOSCOPY;  Surgeon: [name], MD;  [facility];  Service: Endoscopy;  Laterality: N/A;  100pm  . ELECTROPHYSIOLOGIC STUDY N/A 02/24/2015   PVI and CTI ablation by Dr [name]  . EP IMPLANTABLE DEVICE N/A 08/24/2015   Procedure: Loop Recorder Insertion;  Surgeon: Thompson Grayer, MD;  Location: [facility];  Service: Cardiovascular;  Laterality: N/A;  . HYSTEROSCOPY WITH D & C N/A 12/29/2014   Procedure: DILATATION AND CURETTAGE /HYSTEROSCOPY;  Surgeon: [name], MD;  Location: [facility];  Service: Gynecology;  Laterality: N/A;  . Implantable loop recorder removal  04/29/2019   MDT Reveal LINQ removed by Dr [name] in office  . TEE WITHOUT CARDIOVERSION N/A 02/24/2015   Procedure: TRANSESOPHAGEAL ECHOCARDIOGRAM (TEE);  Surgeon: [name], MD;  Location: [facility];  Service: Cardiovascular;  Laterality: N/A;  . TUBAL LIGATION  1984     SOCIAL HISTORY:  Social History   Socioeconomic History  . Marital status: Widowed    Spouse name: Not on file  . Number of children: Not on file  . Years of education: Not on file  . Highest education level: Not on file  Occupational History  .  Not on file  Tobacco Use  . Smoking status: Former Smoker    Packs/day: 1.00    Years: 2.00    Pack years: 2.00    Types: Cigarettes    Start date: 09/17/1962    Quit date: 11/16/1966    Years since quitting: 53.1  . Smokeless tobacco: Never Used  Substance and Sexual Activity  . Alcohol use: No    Alcohol/week: 0.0 standard drinks  . Drug use: No  . Sexual activity: Not Currently    Birth control/protection: Post-menopausal  Other Topics Concern  . Not on file  Social History Narrative   Lives alone in [place]   Retired   Worked previously for [employer] and [employer]   Social Determinants of  Radio broadcast assistant Strain:   . Difficulty of Paying Living Expenses:   Food Insecurity:   . Worried About Charity fundraiser in the Last Year:   . Arboriculturist in the Last Year:   Transportation Needs:   . Film/video editor (Medical):   Marland Kitchen Lack of Transportation (Non-Medical):   Physical Activity:   . Days of Exercise per Week:   . Minutes of Exercise per Session:   Stress:   . Feeling of Stress :   Social Connections:   . Frequency of Communication with Friends and Family:   . Frequency of Social Gatherings with Friends and Family:   . Attends Religious Services:   . Active Member of Clubs or Organizations:   . Attends Archivist Meetings:   Marland Kitchen Marital Status:   Intimate Partner Violence:   . Fear of Current or Ex-Partner:   . Emotionally Abused:   Marland Kitchen Physically Abused:   . Sexually Abused:     FAMILY HISTORY:  Family History  Problem Relation Age of Onset  . Heart disease Mother   . Stroke Father   . Cancer Father   . Colon cancer Father   . Early death Brother   . Cancer Sister   . Cancer Sister   . Cancer Sister   . Diabetes Daughter   . Heart disease Son   . Colon polyps Son   . Breast cancer Neg Hx     CURRENT MEDICATIONS:  Outpatient Encounter Medications as of 12/10/2019  Medication Sig Note  . albuterol (PROVENTIL HFA;VENTOLIN HFA) 108 (90 BASE) MCG/ACT inhaler Inhale into the lungs every 6 (six) hours as needed for wheezing or shortness of breath. Reported on 11/28/2015 08/06/2019: Per patient uses rarely  . brimonidine (ALPHAGAN) 0.2 % ophthalmic solution Place 1 drop into the left eye 2 (two) times daily.    . Calcium Carbonate-Vit D-Min (CALTRATE 600+D PLUS PO) Take 1 tablet by mouth daily.   . Cholecalciferol (VITAMIN D PO) Take 2,000 Units by mouth daily.    . Coenzyme Q10 (COQ-10) 100 MG CAPS Take 100 mg by mouth daily.    . Cranberry 500 MG CAPS Take 500 mg by mouth daily.    Marland Kitchen diltiazem (CARDIZEM) 30 MG tablet TAKE ONE  TABLET BY MOUTH EVERY 4 HOURS AS NEEDED FOR HEART RATE >100 AS LONG AS BLOOD PRESSURE >100 (Patient taking differently: Take 30 mg by mouth every 4 (four) hours as needed (increase heart rate and blood pressure). Heart rate>100 as long as blood pressure >100)   . diltiazem (CARTIA XT) 180 MG 24 hr capsule Take 1 capsule (180 mg total) by mouth daily.   Marland Kitchen ELIQUIS 5 MG TABS tablet Take 1  tablet by mouth twice daily   . fluticasone (FLONASE) 50 MCG/ACT nasal spray Place 1 spray into both nostrils daily as needed for rhinitis.  08/06/2019: Patient state that she seldom uses , only as needed.  . furosemide (LASIX) 20 MG tablet Take 1 tablet (20 mg total) by mouth daily as needed. Take daily as needed for swelling 08/06/2019: Per patient she states that she seldom takes  . hydrocortisone (ANUSOL-HC) 25 MG suppository Place 1 suppository (25 mg total) rectally at bedtime.   Marland Kitchen letrozole (FEMARA) 2.5 MG tablet Take 1 tablet by mouth once daily (Patient taking differently: Take 2.5 mg by mouth daily. )   . loratadine (CLARITIN) 10 MG tablet Take 10 mg by mouth daily as needed for allergies. Reported on 09/20/2015   . LUMIGAN 0.01 % SOLN Place 1 drop into both eyes at bedtime.   Marland Kitchen MAGNESIUM PO Take 400 mg by mouth daily.   . Misc Natural Products (JOINT SUPPORT COMPLEX PO) Take 40 mg by mouth daily. Per pt she takes an over the counter medication daily for joint pain pt unsure of dosage.  Collagen support UC-II Type II Collagen   . Multiple Vitamin (MULTIVITAMIN) tablet Take 1 tablet by mouth daily.    . Omega-3 Fatty Acids (FISH OIL) 1200 MG CAPS Take 1,200 mg by mouth daily.    . psyllium (METAMUCIL SMOOTH TEXTURE) 58.6 % powder Take 1 packet by mouth daily.   Marland Kitchen SOTALOL AF 80 MG TABS Take 1 tablet (80 mg total) by mouth 2 (two) times daily. (Patient taking differently: Take 80 mg by mouth 2 (two) times daily. )    No facility-administered encounter medications on file as of 12/10/2019.    ALLERGIES:   Allergies  Allergen Reactions  . Metoprolol     Makes her feel bad  . Vesicare [Solifenacin]     Severe hypertension and tachycardia   . Reglan [Metoclopramide] Other (See Comments)    Causes severe jitters     PHYSICAL EXAM:  ECOG Performance status: 1  Vitals:   12/10/19 1312  BP: 133/85  Pulse: (!) 55  Resp: 18  Temp: 97.6 F (36.4 C)  SpO2: 98%   Filed Weights   12/10/19 1312  Weight: 210 lb (95.3 kg)    Physical Exam Constitutional:      Appearance: Normal appearance. She is normal weight.  Cardiovascular:     Rate and Rhythm: Normal rate and regular rhythm.     Heart sounds: Normal heart sounds.  Pulmonary:     Effort: Pulmonary effort is normal.     Breath sounds: Normal breath sounds.  Abdominal:     General: Bowel sounds are normal.     Palpations: Abdomen is soft.  Musculoskeletal:        General: Normal range of motion.  Skin:    General: Skin is warm.  Neurological:     Mental Status: She is alert and oriented to person, place, and time. Mental status is at baseline.  Psychiatric:        Mood and Affect: Mood normal.        Behavior: Behavior normal.        Thought Content: Thought content normal.        Judgment: Judgment normal.   Breast: No palpable masses, no skin changes or nipple discharge, no adenopathy.  Lumpectomy scar is within normal limits.   LABORATORY DATA:  I have reviewed the labs as listed.  CBC    Component Value  Date/Time   WBC 8.8 12/03/2019 1149   RBC 4.46 12/03/2019 1149   HGB 13.7 12/03/2019 1149   HGB 14.3 06/17/2017 1654   HCT 41.9 12/03/2019 1149   HCT 41.9 06/17/2017 1654   PLT 221 12/03/2019 1149   PLT 271 06/17/2017 1654   MCV 93.9 12/03/2019 1149   MCV 90 06/17/2017 1654   MCH 30.7 12/03/2019 1149   MCHC 32.7 12/03/2019 1149   RDW 12.4 12/03/2019 1149   RDW 13.3 06/17/2017 1654   LYMPHSABS 2.7 12/03/2019 1149   LYMPHSABS 3.8 (H) 06/17/2017 1654   MONOABS 1.0 12/03/2019 1149   EOSABS 0.2 12/03/2019  1149   EOSABS 0.2 06/17/2017 1654   BASOSABS 0.1 12/03/2019 1149   BASOSABS 0.0 06/17/2017 1654   CMP Latest Ref Rng & Units 12/03/2019 06/04/2019 12/02/2018  Glucose 70 - 99 mg/dL 114(H) 112(H) 95  BUN 8 - 23 mg/dL '13 14 14  '$ Creatinine 0.44 - 1.00 mg/dL 0.68 0.75 0.62  Sodium 135 - 145 mmol/L 137 134(L) 136  Potassium 3.5 - 5.1 mmol/L 4.5 4.4 4.1  Chloride 98 - 111 mmol/L 104 102 104  CO2 22 - 32 mmol/L 23 21(L) 23  Calcium 8.9 - 10.3 mg/dL 9.1 9.0 9.3  Total Protein 6.5 - 8.1 g/dL 6.8 7.0 7.4  Total Bilirubin 0.3 - 1.2 mg/dL 0.5 0.6 0.5  Alkaline Phos 38 - 126 U/L 73 74 73  AST 15 - 41 U/L 38 24 31  ALT 0 - 44 U/L 47(H) 28 33     I personally performed a face-to-face visit,  All questions were answered to patient's stated satisfaction. Encouraged patient to call with any new concerns or questions before his next visit to the cancer center and we can certain see him sooner, if needed.     ASSESSMENT & PLAN:   History of breast cancer, bilateral: Right T1N0 Lumpectomy July 2010, Left T1c, N0 Lumpectomy 04/26/2010. 1. Right breast infiltrating lobular carcinoma: -Status post right lumpectomy and lymph node biopsy on 04/14/2009, PT1BN0, ER/PR positive, Ki 67 8%, margins negative, grade 1, 0.6 cm. -Patient was recommended tamoxifen which she took for approximately 4 years.  This was discontinued secondary to endometrial hyperplasia. -Breast cancer index showed high likelihood of benefit. -She was switched to anastrozole which she could not tolerate secondary due to musculoskeletal symptoms. -She was then switched to Femara which she has been on for the past 6 years with no problems.  She will continue for a total of 10 years.  Patient is wanting to stay on Femara lifelong.  It was explained to patient that there is no benefit after 10 years. -Her last mammogram was on 06/08/2019 which was BI-RADS Category 1 negative. -Physical examination not reveal any concerning findings. -Labs done on  12/03/2019 were all WNL. -She will follow-up in 6 months with repeat mammogram and labs.  2. Left breast IDC: -She had a left breast lumpectomy and lymph node biopsy on 04/26/2010.  Pathology showed 1.7 cm, grade 1, with low-grade DCIS, margins negative, PT1CPN0.  Ki-67 was 9%.  ER/PR positive and HER-2 negative. -Bilateral lumpectomy sites within normal limits.  Bilateral breast had no palpable masses.  3.Osteopropenia: -DEXA scan done on 11/27/2017 showed T score of -1.9.  This was improved from 2018 where her T score was -2.3. -She was offered Prolia but refused given side effects. -She is taking calcium and vitamin D daily.  She takes approximately 3000 units daily. -We will repeat a DEXA scan at her next  visit.      Orders placed this encounter:  Orders Placed This Encounter  Procedures  . MM 3D SCREEN BREAST BILATERAL  . DG Bone Density  . Lactate dehydrogenase  . Magnesium  . CBC with Differential/Platelet  . Comprehensive metabolic panel  . Vitamin B12  . VITAMIN D 25 Hydroxy (Vit-D Deficiency, Fractures)  . Folate      Heidi Finders, FNP-C [place] [phone]

## 2020-01-05 ENCOUNTER — Telehealth: Payer: Self-pay | Admitting: *Deleted

## 2020-01-05 DIAGNOSIS — Z79899 Other long term (current) drug therapy: Secondary | ICD-10-CM

## 2020-01-05 DIAGNOSIS — I480 Paroxysmal atrial fibrillation: Secondary | ICD-10-CM

## 2020-01-05 NOTE — Telephone Encounter (Signed)
Lvm to call clinic back to set up lab appointment for Mag level

## 2020-01-05 NOTE — Telephone Encounter (Signed)
-----   Message from [facility], [place] sent at 12/28/2019  3:23 PM EDT ----- She was to have had the BMET and mag level done by her oncologist, looks like they did not include our lab draw with thiers last month.  She needs both done when she can please.

## 2020-01-08 ENCOUNTER — Other Ambulatory Visit: Payer: Self-pay

## 2020-01-08 ENCOUNTER — Other Ambulatory Visit: Payer: Medicare Other | Admitting: *Deleted

## 2020-01-08 DIAGNOSIS — Z79899 Other long term (current) drug therapy: Secondary | ICD-10-CM | POA: Diagnosis not present

## 2020-01-08 DIAGNOSIS — I480 Paroxysmal atrial fibrillation: Secondary | ICD-10-CM | POA: Diagnosis not present

## 2020-01-08 LAB — MAGNESIUM: Magnesium: 2 mg/dL (ref 1.6–2.3)

## 2020-02-10 ENCOUNTER — Other Ambulatory Visit: Payer: Self-pay | Admitting: Internal Medicine

## 2020-03-18 ENCOUNTER — Other Ambulatory Visit (HOSPITAL_COMMUNITY): Payer: Self-pay | Admitting: Hematology

## 2020-03-21 NOTE — Progress Notes (Signed)
Cardiology Office Note Date:  03/22/2020  Patient ID:  Heidi Lopez 05-20-1946, MRN [id number] PCP:  [name], MD  Cardiologist:  Dr. Harl Bowie Electrophysiologist: Dr. [name]   Chief Complaint:   planned f/u   History of Present Illness: Heidi Lopez is a 74 y.o. female with history of glaucoma, breast ca tx w/surgery and XRT 2010/2011, and PAFib/flutter s/p PVI/CTI ablation 2016 underwent loop implant 08/2015 (has since reached EOS), HTN  At her Oct 2018,visit her AF burden was up to 28.9 % from 16%.  Dr. [name] discussed concerns this would continue to rise and suggested up-titration of her sotalol and/or repeat ablation, the patient declined both.  Also noted was hx of post-termination pauses historically though the patient felt symptoms improved and did not want to pursue pacing.    I saw her 10/30/2019 He most recently saw her Aug 2020, at that visit her ILR was noted EOS and was removed.  She was doing well, had not had symptoms of bradycardia., no changes were made to her therapy.  She reports feeling well.  She has noted her BP unusually high sometimes, generally 110's/70's intermittently 140's100.  Her HR generally 70's-90's, always <100, no overt sense of her Afib or palpitations.  She has a chronic intermittent cough for years, no symptoms of illness.  Her brother died of COVID 03-Oct-2019, a night or two later at rest in bed she had central chest disconfort, perhaps burning that was associated with L arm aching, no other symptoms, this struck her as indigestion, and when she woke was gone. She reports similar symptoms in the past perhaps for years.  She has known GERD and reports carafate seems to help.  No dizzy spells,  Near syncope or syncope. She does not formally exercise, and in fact more sedentary then usual in the past year, denies any DOE, no symptoms or PND or orthopnea, no changes in her exertional capacity.  She had noted some bleeding w/BM, had a colonoscopy  09/24/2019, noted with hemorrhoids, resumed her Eliquis same day, no bleeding since  Discussed stress test/ischemic evaluation, though she wanted to hold off and pursue her PMD for GERD management. She was in AFib and unaware, she was happy with her AFIb symptoms, burden, noting prior to her ILR explant she had been having some intermittent AFIb on the sotalol.   TODAY She is doing well, somewhat surprised to hear she was in Afib today.  She says that she was getting "cartia" froer a few months and she felt like she was having  More palpitations and her BP and HR were higher on it but since switching back to diltiazem via her pharmacy both her HR and BP are better and she is less aware of any palpitations. HR today were high 50's-60's at home.  She mentions that ginger candy keeps her GERD well controlled though will get a slight burning/central discomfort once in a while, lasts a minute or so, no associated symptoms, she does not have to stop what she is doing, is random wiothout a clear trigger and can be doing vigorous activity without it, and does not strike her as heart.   She got COVID vaccine in march and thinks she has felt a little more winded with longer shopping trips.  She denies any rest SOB, no DOE with routine ADLS or shorter trips and not when she is without a mask.   No dizzy spells, no near syncope or syncope.  No bleeding or  signs of bleeding    Afib hx  Diagnosed 2015 PVI/CTI ablation 02/24/2015 AAD hx Failed flecainide (pre-ablation) Recurrent AFib/atypical Aflutter post ablation started on sotalol 10/17/2015  Device information: MDT ILR, implanted 08/24/2015, AFib management, Dr. [name] EOS and removed 04/29/2019   Past Medical History:  Diagnosis Date  . Breast cancer ([place])   . Cancer of breast [facility]) July 2010   s/p XRT 2010 and again in 2011  . Chronic coughing   . Cramps, muscle, general    severe breast cramping   . Dysrhythmia    atrial flutter  . Glaucoma    . Glaucoma    left eye   . Joint pain    foot and knee pain  . Lymphedema of arm    left  . Night sweats   . Osteopenia 01/24/2015  . Pain    breast pain  . Paroxysmal atrial fibrillation ([place]) 05/2014  . Sinus problem   . Wears glasses     Past Surgical History:  Procedure Laterality Date  . BREAST SURGERY  2010- right and 2011-left   for cancer  . CARDIOVERSION N/A 10/21/2014   Procedure: CARDIOVERSION;  Surgeon: [name], MD;  Location: [place];  Service: Cardiovascular;  Laterality: N/A;  . COLONOSCOPY N/A 02/05/2013   Procedure: COLONOSCOPY;  Surgeon: [name], MD;  [facility];  Service: Endoscopy;  Laterality: N/A;  830  . COLONOSCOPY N/A 09/24/2019   Procedure: COLONOSCOPY;  Surgeon: [name], MD;  [facility];  Service: Endoscopy;  Laterality: N/A;  100pm  . ELECTROPHYSIOLOGIC STUDY N/A 02/24/2015   PVI and CTI ablation by Dr [name]  . EP IMPLANTABLE DEVICE N/A 08/24/2015   Procedure: Loop Recorder Insertion;  Surgeon: Thompson Grayer, MD;  Location: [place] CV LAB;  Service: Cardiovascular;  Laterality: N/A;  . HYSTEROSCOPY WITH D & C N/A 12/29/2014   Procedure: DILATATION AND CURETTAGE /HYSTEROSCOPY;  Surgeon: [name], MD;  Location: [facility];  Service: Gynecology;  Laterality: N/A;  . Implantable loop recorder removal  04/29/2019   MDT Reveal LINQ removed by Dr [name] in office  . TEE WITHOUT CARDIOVERSION N/A 02/24/2015   Procedure: TRANSESOPHAGEAL ECHOCARDIOGRAM (TEE);  Surgeon: [name], MD;  Location: [facility] ENDOSCOPY;  Service: Cardiovascular;  Laterality: N/A;  . TUBAL LIGATION  1984    Current Outpatient Medications  Medication Sig Dispense Refill  . albuterol (PROVENTIL HFA;VENTOLIN HFA) 108 (90 BASE) MCG/ACT inhaler Inhale into the lungs every 6 (six) hours as needed for wheezing or shortness of breath. Reported on 11/28/2015    . brimonidine (ALPHAGAN) 0.2 % ophthalmic solution Place 1 drop into the left eye 2  (two) times daily.     . Calcium Carbonate-Vit D-Min (CALTRATE 600+D PLUS PO) Take 1 tablet by mouth daily.    . Cholecalciferol (VITAMIN D PO) Take 2,000 Units by mouth daily.     . Coenzyme Q10 (COQ-10) 100 MG CAPS Take 100 mg by mouth daily.     . Cranberry 500 MG CAPS Take 500 mg by mouth daily.     Marland Kitchen diltiazem (CARDIZEM) 30 MG tablet TAKE ONE TABLET BY MOUTH EVERY 4 HOURS AS NEEDED FOR HEART RATE >100 AS LONG AS BLOOD PRESSURE >100 (Patient taking differently: Take 30 mg by mouth every 4 (four) hours as needed (increase heart rate and blood pressure). Heart rate>100 as long as blood pressure >100) 45 tablet 3  . diltiazem (CARTIA XT) 180 MG 24 hr capsule  Take 1 capsule (180 mg total) by mouth daily. 90 capsule 3  . ELIQUIS 5 MG TABS tablet Take 1 tablet by mouth twice daily 180 tablet 2  . fluticasone (FLONASE) 50 MCG/ACT nasal spray Place 1 spray into both nostrils daily as needed for rhinitis.     . furosemide (LASIX) 20 MG tablet Take 1 tablet (20 mg total) by mouth daily as needed. Take daily as needed for swelling 90 tablet 0  . hydrocortisone (ANUSOL-HC) 25 MG suppository Place 1 suppository (25 mg total) rectally at bedtime. 14 suppository 1  . letrozole (FEMARA) 2.5 MG tablet Take 1 tablet by mouth once daily 90 tablet 0  . loratadine (CLARITIN) 10 MG tablet Take 10 mg by mouth daily as needed for allergies. Reported on 09/20/2015    . LUMIGAN 0.01 % SOLN Place 1 drop into both eyes at bedtime.    Marland Kitchen MAGNESIUM PO Take 400 mg by mouth daily.    . Misc Natural Products (JOINT SUPPORT COMPLEX PO) Take 40 mg by mouth daily. Per pt she takes an over the counter medication daily for joint pain pt unsure of dosage.  Collagen support UC-II Type II Collagen    . Multiple Vitamin (MULTIVITAMIN) tablet Take 1 tablet by mouth daily.     . Omega-3 Fatty Acids (FISH OIL) 1200 MG CAPS Take 1,200 mg by mouth daily.     . psyllium (METAMUCIL SMOOTH TEXTURE) 58.6 % powder Take 1 packet by mouth daily.      Marland Kitchen SOTALOL AF 80 MG TABS Take 1 tablet by mouth twice daily 180 tablet 0   No current facility-administered medications for this visit.    Allergies:   Metoprolol, Vesicare [solifenacin], and Reglan [metoclopramide]   Social History:  The patient  reports that she quit smoking about 53 years ago. Her smoking use included cigarettes. She started smoking about 57 years ago. She has a 2.00 pack-year smoking history. She has never used smokeless tobacco. She reports that she does not drink alcohol and does not use drugs.   Family History:  The patient's family history includes Cancer in her father, sister, sister, and sister; Colon cancer in her father; Colon polyps in her son; Diabetes in her daughter; Early death in her brother; Heart disease in her mother and son; Stroke in her father.  ROS:  Please see the history of present illness.  All other systems are reviewed and otherwise negative.   PHYSICAL EXAM: VS:  BP 124/76   Pulse 89   Ht 5\' 3"  (1.6 m)   Wt 212 lb (96.2 kg)   BMI 37.55 kg/m  BMI: Body mass index is 37.55 kg/m. Well nourished, well developed, in no acute distress  HEENT: normocephalic, atraumatic  Neck: no JVD, carotid bruits or masses Cardiac:  irreg-irreg; no significant murmurs, no rubs, or gallops Lungs:  CTA b/l, no wheezing, rhonchi or rales  Abd: soft, nontender MS: no deformity or atrophy Ext: trace edema Left (chronically per the patient L>R) Skin: warm and dry, no rash Neuro:  No gross deficits appreciated Psych: euthymic mood, full affect     EKG: done today and reviewed by myself:  AFib 89bpm, QT 365ms, QTc 444ms 10/30/2019: AFib 97bpm, measured QT 360-377ms, Qtc 458-419ms    02/24/15: TEE Left ventricle:  LVEF is normal. ------------------------------------------------------------------- Aortic valve:  AV is normal NO AI> ------------------------------------------------------------------- Mitral valve:  MV is normal Mild  MR. ------------------------------------------------------------------- Left atrium:   No evidence of thrombus in the atrial cavity  or appendage.  No evidence of thrombus in the atrial cavity or appendage. ------------------------------------------------------------------ Atrial septum:  No obvious PFO by color doppler ------------------------------------------------------------------- Pulmonic valve:   PV is normal. ------------------------------------------------------------------- Tricuspid valve:  TV is normal Mild TR.     Recent Labs: 12/03/2019: ALT 47; BUN 13; Creatinine, Ser 0.68; Hemoglobin 13.7; Platelets 221; Potassium 4.5; Sodium 137 01/08/2020: Magnesium 2.0  No results found for requested labs within last 8760 hours.   CrCl cannot be calculated (Patient's most recent lab result is older than the maximum 21 days allowed.).   Wt Readings from Last 3 Encounters:  03/22/20 212 lb (96.2 kg)  12/10/19 210 lb (95.3 kg)  10/30/19 210 lb (95.3 kg)     Other studies reviewed: Additional studies/records reviewed today include: summarized above  ASSESSMENT AND PLAN:  1. Paroxysmal AFib/flutter     CHA2DS2Vasc is 3, on Eliquis, appropriately dosed     On Sotalol      Qt stable  This is the second visit with AFib.  She does not think she is having Afib all of the time, in fact less of late, though was surprised to here she was in AFib today Mentions again today, she would not want to pursue repeat ablation          2. Hx of post termination pauses     Patient has historically not wanted to pursue pacing     No symptoms of bradycardia  3. HTN     Looks OK, no changes      4. CP     More atypical sounding 5. ? New DOE with long shoppig trips  She mentions that she is getting back into walking for exercise since it is OK to be out and about and confident in her vaccine. Discussed getting an echo, revisiting stress testing and wearing a monitor to assess her AFib  burden given she is on AAD.  She wants to hold off.  She mentions that she has been under increased personal stress of late, is busy helping a sister who has had recurrent cancer diagnosis and her son, getting through colon resection and helping with the grandchildren.  She will call if/when she wants to schedule  And urged to let us know if any kind of escalation     Disposition: 6 mo, sooner if needed, BMET, CBC and mag today.   Current medicines are reviewed at length with the patient today.  The patient did not have any concerns regarding medicines.  Venetia Night, PA-C 03/22/2020 4:22 PM     [place] [address] [phone] (office)  [phone] (fax)

## 2020-03-22 ENCOUNTER — Ambulatory Visit (INDEPENDENT_AMBULATORY_CARE_PROVIDER_SITE_OTHER): Payer: Medicare Other | Admitting: Physician Assistant

## 2020-03-22 ENCOUNTER — Other Ambulatory Visit: Payer: Self-pay

## 2020-03-22 VITALS — BP 124/76 | HR 89 | Ht 63.0 in | Wt 212.0 lb

## 2020-03-22 DIAGNOSIS — Z79899 Other long term (current) drug therapy: Secondary | ICD-10-CM | POA: Diagnosis not present

## 2020-03-22 DIAGNOSIS — I480 Paroxysmal atrial fibrillation: Secondary | ICD-10-CM | POA: Diagnosis not present

## 2020-03-22 DIAGNOSIS — I10 Essential (primary) hypertension: Secondary | ICD-10-CM

## 2020-03-22 DIAGNOSIS — R0789 Other chest pain: Secondary | ICD-10-CM

## 2020-03-22 DIAGNOSIS — Z5181 Encounter for therapeutic drug level monitoring: Secondary | ICD-10-CM

## 2020-03-22 MED ORDER — FUROSEMIDE 20 MG PO TABS: 20.0000 mg | ORAL_TABLET | Freq: Every day | ORAL | 0 refills | Status: DC | PRN

## 2020-03-22 NOTE — Patient Instructions (Signed)
Medication Instructions:   Your physician recommends that you continue on your current medications as directed. Please refer to the Current Medication list given to you today.  *If you need a refill on your cardiac medications before your next appointment, please call your pharmacy*   Lab Work:  BMET/ MAG/ CBC TODAY    If you have labs (blood work) drawn today and your tests are completely normal, you will receive your results only by: Marland Kitchen MyChart Message (if you have MyChart) OR . A paper copy in the mail If you have any lab test that is abnormal or we need to change your treatment, we will call you to review the results.   Testing/Procedures: NONE ORDERED  TODAY   Follow-Up: At [facility], you and your health needs are our priority.  As part of our continuing mission to provide you with exceptional heart care, we have created designated Provider Care Teams.  These Care Teams include your primary Cardiologist (physician) and Advanced Practice Providers (APPs -  Physician Assistants and Nurse Practitioners) who all work together to provide you with the care you need, when you need it.  We recommend signing up for the patient portal called "MyChart".  Sign up information is provided on this After Visit Summary.  MyChart is used to connect with patients for Virtual Visits (Telemedicine).  Patients are able to view lab/test results, encounter notes, upcoming appointments, etc.  Non-urgent messages can be sent to your provider as well.   To learn more about what you can do with MyChart, go to [email or web address].    Your next appointment:   6 month(s)  The format for your next appointment:   In Person  Provider:   You may see Dr. [name]  or one of the following Advanced Practice Providers on your designated Care Team:    Chanetta Marshall, NP  Tommye Standard, PA-C  Legrand Como "Oda [place], [place]    Other Instructions

## 2020-03-23 LAB — BASIC METABOLIC PANEL
BUN/Creatinine Ratio: 15 (ref 12–28)
BUN: 11 mg/dL (ref 8–27)
CO2: 20 mmol/L (ref 20–29)
Calcium: 9.6 mg/dL (ref 8.7–10.3)
Chloride: 102 mmol/L (ref 96–106)
Creatinine, Ser: 0.75 mg/dL (ref 0.57–1.00)
GFR calc Af Amer: 91 mL/min/{1.73_m2} (ref >59)
GFR calc non Af Amer: 79 mL/min/{1.73_m2} (ref >59)
Glucose: 90 mg/dL (ref 65–99)
Potassium: 4.5 mmol/L (ref 3.5–5.2)
Sodium: 139 mmol/L (ref 134–144)

## 2020-03-23 LAB — CBC
Hematocrit: 40 % (ref 34.0–46.6)
Hemoglobin: 13.9 g/dL (ref 11.1–15.9)
MCH: 30.5 pg (ref 26.6–33.0)
MCHC: 34.8 g/dL (ref 31.5–35.7)
MCV: 88 fL (ref 79–97)
Platelets: 198 10*3/uL (ref 150–450)
RBC: 4.55 x10E6/uL (ref 3.77–5.28)
RDW: 13.1 % (ref 11.7–15.4)
WBC: 8.1 10*3/uL (ref 3.4–10.8)

## 2020-03-23 LAB — MAGNESIUM: Magnesium: 2 mg/dL (ref 1.6–2.3)

## 2020-05-06 ENCOUNTER — Other Ambulatory Visit: Payer: Self-pay | Admitting: Internal Medicine

## 2020-05-27 ENCOUNTER — Other Ambulatory Visit: Payer: Self-pay | Admitting: Internal Medicine

## 2020-06-08 ENCOUNTER — Ambulatory Visit
Admission: RE | Admit: 2020-06-08 | Discharge: 2020-06-08 | Disposition: A | Discharge status: Home / Self Care | Payer: Medicare Other | Source: Ambulatory Visit | Attending: Nurse Practitioner | Admitting: Nurse Practitioner

## 2020-06-08 ENCOUNTER — Other Ambulatory Visit: Payer: Self-pay

## 2020-06-08 DIAGNOSIS — Z1231 Encounter for screening mammogram for malignant neoplasm of breast: Secondary | ICD-10-CM

## 2020-06-13 ENCOUNTER — Ambulatory Visit (HOSPITAL_COMMUNITY)
Admission: RE | Admit: 2020-06-13 | Discharge: 2020-06-13 | Disposition: A | Discharge status: Home / Self Care | Payer: Medicare Other | Source: Ambulatory Visit | Attending: Nurse Practitioner | Admitting: Nurse Practitioner

## 2020-06-13 ENCOUNTER — Ambulatory Visit (HOSPITAL_COMMUNITY): Payer: Medicare Other

## 2020-06-13 ENCOUNTER — Inpatient Hospital Stay (HOSPITAL_COMMUNITY): Payer: Medicare Other | Attending: Hematology

## 2020-06-13 ENCOUNTER — Other Ambulatory Visit: Payer: Self-pay

## 2020-06-13 DIAGNOSIS — C50911 Malignant neoplasm of unspecified site of right female breast: Secondary | ICD-10-CM | POA: Insufficient documentation

## 2020-06-13 DIAGNOSIS — M8580 Other specified disorders of bone density and structure, unspecified site: Secondary | ICD-10-CM | POA: Insufficient documentation

## 2020-06-13 DIAGNOSIS — Z780 Asymptomatic menopausal state: Secondary | ICD-10-CM | POA: Diagnosis not present

## 2020-06-13 DIAGNOSIS — M8589 Other specified disorders of bone density and structure, multiple sites: Secondary | ICD-10-CM | POA: Diagnosis not present

## 2020-06-13 DIAGNOSIS — M810 Age-related osteoporosis without current pathological fracture: Secondary | ICD-10-CM | POA: Diagnosis not present

## 2020-06-13 DIAGNOSIS — Z79811 Long term (current) use of aromatase inhibitors: Secondary | ICD-10-CM | POA: Diagnosis not present

## 2020-06-13 DIAGNOSIS — Z170 Estrogen receptor positive status [ER+]: Secondary | ICD-10-CM | POA: Diagnosis not present

## 2020-06-13 DIAGNOSIS — Z853 Personal history of malignant neoplasm of breast: Secondary | ICD-10-CM | POA: Diagnosis not present

## 2020-06-13 DIAGNOSIS — R29890 Loss of height: Secondary | ICD-10-CM | POA: Diagnosis not present

## 2020-06-13 LAB — COMPREHENSIVE METABOLIC PANEL
ALT: 59 U/L — ABNORMAL HIGH (ref 0–44)
AST: 43 U/L — ABNORMAL HIGH (ref 15–41)
Albumin: 4 g/dL (ref 3.5–5.0)
Alkaline Phosphatase: 72 U/L (ref 38–126)
Anion gap: 9 (ref 5–15)
BUN: 13 mg/dL (ref 8–23)
CO2: 21 mmol/L — ABNORMAL LOW (ref 22–32)
Calcium: 9 mg/dL (ref 8.9–10.3)
Chloride: 104 mmol/L (ref 98–111)
Creatinine, Ser: 0.72 mg/dL (ref 0.44–1.00)
GFR calc Af Amer: >60 mL/min (ref >60)
GFR calc non Af Amer: >60 mL/min (ref >60)
Glucose, Bld: 111 mg/dL — ABNORMAL HIGH (ref 70–99)
Potassium: 4.6 mmol/L (ref 3.5–5.1)
Sodium: 134 mmol/L — ABNORMAL LOW (ref 135–145)
Total Bilirubin: 0.7 mg/dL (ref 0.3–1.2)
Total Protein: 6.6 g/dL (ref 6.5–8.1)

## 2020-06-13 LAB — FOLATE: Folate: 30.9 ng/mL (ref >5.9)

## 2020-06-13 LAB — CBC WITH DIFFERENTIAL/PLATELET
Abs Immature Granulocytes: 0.02 10*3/uL (ref 0.00–0.07)
Basophils Absolute: 0.1 10*3/uL (ref 0.0–0.1)
Basophils Relative: 1 %
Eosinophils Absolute: 0.1 10*3/uL (ref 0.0–0.5)
Eosinophils Relative: 2 %
HCT: 41.2 % (ref 36.0–46.0)
Hemoglobin: 13.7 g/dL (ref 12.0–15.0)
Immature Granulocytes: 0 %
Lymphocytes Relative: 31 %
Lymphs Abs: 2.5 10*3/uL (ref 0.7–4.0)
MCH: 31.4 pg (ref 26.0–34.0)
MCHC: 33.3 g/dL (ref 30.0–36.0)
MCV: 94.3 fL (ref 80.0–100.0)
Monocytes Absolute: 1 10*3/uL (ref 0.1–1.0)
Monocytes Relative: 13 %
Neutro Abs: 4.2 10*3/uL (ref 1.7–7.7)
Neutrophils Relative %: 53 %
Platelets: 213 10*3/uL (ref 150–400)
RBC: 4.37 MIL/uL (ref 3.87–5.11)
RDW: 12.4 % (ref 11.5–15.5)
WBC: 7.8 10*3/uL (ref 4.0–10.5)
nRBC: 0 % (ref 0.0–0.2)

## 2020-06-13 LAB — LACTATE DEHYDROGENASE: LDH: 187 U/L (ref 98–192)

## 2020-06-13 LAB — MAGNESIUM: Magnesium: 2 mg/dL (ref 1.7–2.4)

## 2020-06-13 LAB — VITAMIN B12: Vitamin B-12: 487 pg/mL (ref 180–914)

## 2020-06-13 LAB — VITAMIN D 25 HYDROXY (VIT D DEFICIENCY, FRACTURES): Vit D, 25-Hydroxy: 56.58 ng/mL (ref 30–100)

## 2020-06-20 ENCOUNTER — Encounter (HOSPITAL_COMMUNITY): Payer: Self-pay | Admitting: Hematology

## 2020-06-20 ENCOUNTER — Inpatient Hospital Stay (HOSPITAL_COMMUNITY): Payer: Medicare Other | Attending: Hematology | Admitting: Hematology

## 2020-06-20 VITALS — BP 164/90 | HR 102 | Temp 97.2°F | Resp 18 | Wt 213.7 lb

## 2020-06-20 DIAGNOSIS — Z23 Encounter for immunization: Secondary | ICD-10-CM | POA: Diagnosis not present

## 2020-06-20 DIAGNOSIS — Z87891 Personal history of nicotine dependence: Secondary | ICD-10-CM | POA: Insufficient documentation

## 2020-06-20 DIAGNOSIS — Z79811 Long term (current) use of aromatase inhibitors: Secondary | ICD-10-CM | POA: Insufficient documentation

## 2020-06-20 DIAGNOSIS — C50911 Malignant neoplasm of unspecified site of right female breast: Secondary | ICD-10-CM | POA: Diagnosis not present

## 2020-06-20 DIAGNOSIS — Z170 Estrogen receptor positive status [ER+]: Secondary | ICD-10-CM | POA: Diagnosis not present

## 2020-06-20 DIAGNOSIS — Z853 Personal history of malignant neoplasm of breast: Secondary | ICD-10-CM | POA: Diagnosis not present

## 2020-06-20 DIAGNOSIS — Z7901 Long term (current) use of anticoagulants: Secondary | ICD-10-CM | POA: Diagnosis not present

## 2020-06-20 DIAGNOSIS — C50912 Malignant neoplasm of unspecified site of left female breast: Secondary | ICD-10-CM | POA: Insufficient documentation

## 2020-06-20 DIAGNOSIS — Z79899 Other long term (current) drug therapy: Secondary | ICD-10-CM | POA: Diagnosis not present

## 2020-06-20 DIAGNOSIS — R748 Abnormal levels of other serum enzymes: Secondary | ICD-10-CM | POA: Diagnosis not present

## 2020-06-20 DIAGNOSIS — M8580 Other specified disorders of bone density and structure, unspecified site: Secondary | ICD-10-CM | POA: Diagnosis not present

## 2020-06-20 NOTE — Progress Notes (Signed)
[facility] [address], [place] [address]   Patient Care Team: [name], MD as PCP - General (Internal Medicine) Thompson Grayer, MD as PCP - Electrophysiology (Cardiology) Thea Silversmith, MD as Consulting Physician (Radiation Oncology) Everardo All, MD as Consulting Physician (Hematology and Oncology) [name], MD as Consulting Physician (Gastroenterology)  SUMMARY OF ONCOLOGIC HISTORY: Oncology History   No history exists.    CHIEF COMPLIANT: Follow-up for history of bilateral breast cancer   INTERVAL HISTORY: [name] is a 74 y.o. female here today for follow up of her history of bilateral breast cancer. Her last visit was on 12/09/2018.   Today she reports feeling well. She is taking Femara and tolerating it well.   REVIEW OF SYSTEMS:   Review of Systems  Constitutional: Positive for fatigue (50%). Negative for appetite change.  Respiratory: Positive for shortness of breath (w/ exertion).   Cardiovascular: Positive for leg swelling (ankle swelling) and palpitations (d/t a-fib).  Gastrointestinal: Positive for blood in stool (intermittent bleeding from hemorrhoids).  All other systems reviewed and are negative.   I have reviewed the past medical history, past surgical history, social history and family history with the patient and they are unchanged from previous note.   ALLERGIES:   is allergic to metoprolol, vesicare [solifenacin], and reglan [metoclopramide].   MEDICATIONS:  Current Outpatient Medications  Medication Sig Dispense Refill  . albuterol (PROVENTIL HFA;VENTOLIN HFA) 108 (90 BASE) MCG/ACT inhaler Inhale into the lungs every 6 (six) hours as needed for wheezing or shortness of breath. Reported on 11/28/2015    . brimonidine (ALPHAGAN) 0.2 % ophthalmic solution Place 1 drop into the left eye 2 (two) times daily.     . Calcium Carbonate-Vit D-Min (CALTRATE 600+D PLUS PO) Take 1 tablet by mouth daily.    .  Cholecalciferol (VITAMIN D PO) Take 2,000 Units by mouth daily.     . Coenzyme Q10 (COQ-10) 100 MG CAPS Take 100 mg by mouth daily.     . Cranberry 500 MG CAPS Take 500 mg by mouth daily.     Marland Kitchen diltiazem (CARDIZEM CD) 180 MG 24 hr capsule Take 1 capsule by mouth once daily 90 capsule 3  . diltiazem (CARDIZEM) 30 MG tablet TAKE ONE TABLET BY MOUTH EVERY 4 HOURS AS NEEDED FOR HEART RATE >100 AS LONG AS BLOOD PRESSURE >100 (Patient taking differently: Take 30 mg by mouth every 4 (four) hours as needed (increase heart rate and blood pressure). Heart rate>100 as long as blood pressure >100) 45 tablet 3  . ELIQUIS 5 MG TABS tablet Take 1 tablet by mouth twice daily 180 tablet 2  . fluticasone (FLONASE) 50 MCG/ACT nasal spray Place 1 spray into both nostrils daily as needed for rhinitis.     . furosemide (LASIX) 20 MG tablet Take 1 tablet (20 mg total) by mouth daily as needed. Take daily as needed for swelling 90 tablet 0  . letrozole (FEMARA) 2.5 MG tablet Take 1 tablet by mouth once daily 90 tablet 0  . loratadine (CLARITIN) 10 MG tablet Take 10 mg by mouth daily as needed for allergies. Reported on 09/20/2015    . LUMIGAN 0.01 % SOLN Place 1 drop into both eyes at bedtime.    Marland Kitchen MAGNESIUM PO Take 400 mg by mouth daily.    . Misc Natural Products (JOINT SUPPORT COMPLEX PO) Take 40 mg by mouth daily. Per pt she takes an over the counter medication daily for joint  pain pt unsure of dosage.  Collagen support UC-II Type II Collagen    . Multiple Vitamin (MULTIVITAMIN) tablet Take 1 tablet by mouth daily.     . Omega-3 Fatty Acids (FISH OIL) 1200 MG CAPS Take 1,200 mg by mouth daily.     . psyllium (METAMUCIL SMOOTH TEXTURE) 58.6 % powder Take 1 packet by mouth daily.    Marland Kitchen SOTALOL AF 80 MG TABS Take 1 tablet by mouth twice daily 180 tablet 3   No current facility-administered medications for this visit.     PHYSICAL EXAMINATION: Performance status (ECOG): 1 - Symptomatic but completely  ambulatory  Vitals:   06/20/20 1409  BP: (!) 164/90  Pulse: (!) 102  Resp: 18  Temp: (!) 97.2 F (36.2 C)  SpO2: 94%   Wt Readings from Last 3 Encounters:  06/20/20 213 lb 11.2 oz (96.9 kg)  03/22/20 212 lb (96.2 kg)  12/10/19 210 lb (95.3 kg)   Physical Exam Vitals reviewed.  Constitutional:      Appearance: Normal appearance. She is obese.  Cardiovascular:     Rate and Rhythm: Normal rate. Rhythm irregular.     Pulses: Normal pulses.     Heart sounds: Normal heart sounds.  Pulmonary:     Effort: Pulmonary effort is normal.     Breath sounds: Normal breath sounds.  Chest:     Breasts:        Right: Skin change (skin thickness over upper outer lumpectomy site) present.   Abdominal:     Palpations: Abdomen is soft. There is no hepatomegaly, splenomegaly or mass.     Tenderness: There is no abdominal tenderness.     Hernia: No hernia is present.  Lymphadenopathy:     Cervical: No cervical adenopathy.     Upper Body:     Right upper body: No supraclavicular, axillary or pectoral adenopathy.     Left upper body: No supraclavicular, axillary or pectoral adenopathy.     Lower Body: No right inguinal adenopathy. No left inguinal adenopathy.  Neurological:     General: No focal deficit present.     Mental Status: She is alert and oriented to person, place, and time.  Psychiatric:        Mood and Affect: Mood normal.        Behavior: Behavior normal.     Breast Exam Chaperone: Milinda Antis, MD     LABORATORY DATA:  I have reviewed the data as listed CMP Latest Ref Rng & Units 06/13/2020 03/22/2020 12/03/2019  Glucose 70 - 99 mg/dL 111(H) 90 114(H)  BUN 8 - 23 mg/dL _0 Creatinine 0.44 - 1.00 mg/dL 0.72 0.75 0.68  Sodium 135 - 145 mmol/L 134(L) 139 137  Potassium 3.5 - 5.1 mmol/L 4.6 4.5 4.5  Chloride 98 - 111 mmol/L 104 102 104  CO2 22 - 32 mmol/L 21(L) 20 23  Calcium 8.9 - 10.3 mg/dL 9.0 9.6 9.1  Total Protein 6.5 - 8.1 g/dL 6.6 - 6.8  Total Bilirubin  0.3 - 1.2 mg/dL 0.7 - 0.5  Alkaline Phos 38 - 126 U/L 72 - 73  AST 15 - 41 U/L 43(H) - 38  ALT 0 - 44 U/L 59(H) - 47(H)   No results found for: [id number] Lab Results  Component Value Date   WBC 7.8 06/13/2020   HGB 13.7 06/13/2020   HCT 41.2 06/13/2020   MCV 94.3 06/13/2020   PLT 213 06/13/2020   NEUTROABS 4.2 06/13/2020   Lab Results  Component Value Date   LDH 187 06/13/2020   LDH 179 12/02/2018   LDH 185 04/24/2010   Lab Results  Component Value Date   VD25OH 56.58 06/13/2020   VD25OH 36.58 12/03/2019   VD25OH 37.3 06/04/2019    ASSESSMENT:  1.  Right breast infiltrating lobular carcinoma: - Status post right lumpectomy and lymph node biopsy on 04/14/2009, pT1b, PN 0, ER/PR positive, Ki-67 8%, margins negative, grade 1, 0.6 cm. -Patient was recommended tamoxifen which she took for approximately 4 years.  This was discontinued secondary to endometrial hyperplasia. - Breast cancer index testing shows high likelihood of benefit. -She was subsequently switched to anastrozole and could not tolerate secondary to musculoskeletal symptoms. -She is currently taking Femara without any major problems. -Mammogram on 06/08/2020 was BI-RADS Category 1.  2.  Left breast IDC: -She had a left breast lumpectomy and lymph node biopsy on 04/26/2010.  Pathology showed 1.7 cm, grade 1, with low-grade DCIS, margins negative.  PT1CPN0.  Ki-67 was 9%.  ER/PR positive and HER-2 negative. -Bilateral lumpectomy sites within normal limits.  Bilateral breast has no palpable masses.  3.  Osteopenia: - DEXA scan on 11/27/2017 shows T score of -1.9.  This has improved from a T score of -2.3 on 11/22/2016. -She was offered Prolia but she refused it given the side effects.  PLAN:  1.  Right breast ILC and left breast IDC: -She is tolerating letrozole very well.  Continue letrozole for total of 10 years. -Reviewed mammogram.  Reviewed labs which showed elevated AST and ALT. -AST was intermittently elevated  for the last 10 years.  Could be fatty liver. -Recommend CT abdomen with contrast to evaluate liver.  2.  Osteopenia: -Vitamin D level was normal.  Continue vitamin D 3000 units daily.  Breast Cancer therapy associated bone loss: I have recommended calcium, Vitamin D and weight bearing exercises.   Orders Placed This Encounter  Procedures  . CT Abdomen W Contrast    Standing Status:   Future    Standing Expiration Date:   06/20/2021    Order Specific Question:   If indicated for the ordered procedure, I authorize the administration of contrast media per Radiology protocol    Answer:   Yes    Order Specific Question:   Preferred imaging location?    Answer:   [facility]    Order Specific Question:   Release to patient    Answer:   Immediate    Order Specific Question:   Is Oral Contrast requested for this exam?    Answer:   Yes, Per Radiology protocol   The patient has a good understanding of the overall plan. she agrees with it. she will call with any problems that may develop before the next visit here.    Derek Jack, MD [place] [phone]   I, Milinda Antis, am acting as a scribe for Dr. Sanda Linger.  I, Derek Jack MD, have reviewed the above documentation for accuracy and completeness, and I agree with the above.

## 2020-06-20 NOTE — Patient Instructions (Signed)
[place] at [facility] Discharge Instructions  You were seen today by Dr. Delton Coombes. He went over your recent results. You will be scheduled for a CT scan of your abdomen. Dr. Delton Coombes will see you back after the scan for follow up.   Thank you for choosing [place] at [facility] to provide your oncology and hematology care.  To afford each patient quality time with our provider, please arrive at least 15 minutes before your scheduled appointment time.   If you have a lab appointment with the [facility] please come in thru the Main Entrance and check in at the main information desk  You need to re-schedule your appointment should you arrive 10 or more minutes late.  We strive to give you quality time with our providers, and arriving late affects you and other patients whose appointments are after yours.  Also, if you no show three or more times for appointments you may be dismissed from the clinic at the providers discretion.     Again, thank you for choosing [facility].  Our hope is that these requests will decrease the amount of time that you wait before being seen by our physicians.       _____________________________________________________________  Should you have questions after your visit to [facility], please contact our office at [phone] between the hours of 8:00 a.m. and 4:30 p.m.  Voicemails left after 4:00 p.m. will not be returned until the following business day.  For prescription refill requests, have your pharmacy contact our office and allow 72 hours.    Cancer Center Support Programs:   > Cancer Support Group  2nd Tuesday of the month 1pm-2pm, Journey Room

## 2020-06-29 DIAGNOSIS — H401122 Primary open-angle glaucoma, left eye, moderate stage: Secondary | ICD-10-CM | POA: Diagnosis not present

## 2020-06-29 DIAGNOSIS — H401111 Primary open-angle glaucoma, right eye, mild stage: Secondary | ICD-10-CM | POA: Diagnosis not present

## 2020-07-01 ENCOUNTER — Other Ambulatory Visit (HOSPITAL_COMMUNITY): Payer: Self-pay | Admitting: *Deleted

## 2020-07-01 MED ORDER — LETROZOLE 2.5 MG PO TABS
2.5000 mg | ORAL_TABLET | Freq: Every day | ORAL | 0 refills | Status: DC
Start: 2020-07-01 — End: 2020-07-13

## 2020-07-04 ENCOUNTER — Other Ambulatory Visit (HOSPITAL_COMMUNITY): Payer: Self-pay | Admitting: Internal Medicine

## 2020-07-04 NOTE — Telephone Encounter (Signed)
Pt last saw Tommye Standard, [place] on 03/22/20, last labs 06/13/20 Creat 0.72, age 74, weight 96.9kg, based on specified criteria pt is on appropriate dosage of Eliquis 5mg  BID.  Will refill rx.

## 2020-07-12 ENCOUNTER — Ambulatory Visit (HOSPITAL_COMMUNITY)
Admission: RE | Admit: 2020-07-12 | Discharge: 2020-07-12 | Disposition: A | Discharge status: Home / Self Care | Payer: Medicare Other | Source: Ambulatory Visit | Attending: Hematology | Admitting: Hematology

## 2020-07-12 ENCOUNTER — Other Ambulatory Visit: Payer: Self-pay

## 2020-07-12 DIAGNOSIS — R748 Abnormal levels of other serum enzymes: Secondary | ICD-10-CM | POA: Diagnosis not present

## 2020-07-12 DIAGNOSIS — I700 Atherosclerosis of aorta: Secondary | ICD-10-CM | POA: Diagnosis not present

## 2020-07-12 DIAGNOSIS — R945 Abnormal results of liver function studies: Secondary | ICD-10-CM | POA: Diagnosis not present

## 2020-07-12 DIAGNOSIS — Z853 Personal history of malignant neoplasm of breast: Secondary | ICD-10-CM | POA: Insufficient documentation

## 2020-07-12 DIAGNOSIS — R7989 Other specified abnormal findings of blood chemistry: Secondary | ICD-10-CM | POA: Diagnosis not present

## 2020-07-12 MED ORDER — IOHEXOL 300 MG/ML  SOLN
100.0000 mL | Freq: Once | INTRAMUSCULAR | Status: AC | PRN
  Administered 2020-07-12: 100 mL via INTRAVENOUS

## 2020-07-12 NOTE — Progress Notes (Signed)
[place], [place] [address]   CLINIC:  Medical Oncology/Hematology  PCP:  [name], MD [address] / [place] [address] [phone]   REASON FOR VISIT:  Follow-up for bilateral breast cancer  PRIOR THERAPY:  1. Right lumpectomy on 04/14/2009. 2. Tamoxifen for 4 years.  NGS Results: ER/PR positive, Ki-67 8%  CURRENT THERAPY: Letrozole daily  BRIEF ONCOLOGIC HISTORY:  Oncology History   No history exists.    CANCER STAGING: Cancer Staging No matching staging information was found for the patient.  INTERVAL HISTORY:  Heidi Lopez, a 74 y.o. female, returns for routine follow-up of her bilateral breast cancer. Heidi Lopez was last seen on 06/20/2020.   Today she reports she has been stable with appetite and energy levels of 100%.  I have ordered a CT scan of the abdomen and pelvis for elevated liver enzymes.  She had it done yesterday.  REVIEW OF SYSTEMS:  Review of Systems  Respiratory: Positive for cough.   Cardiovascular: Positive for palpitations.  All other systems reviewed and are negative.   PAST MEDICAL/SURGICAL HISTORY:  Past Medical History:  Diagnosis Date  . Breast cancer ([place])   . Cancer of breast [facility]) July 2010   s/p XRT 2010 and again in 2011  . Chronic coughing   . Cramps, muscle, general    severe breast cramping   . Dysrhythmia    atrial flutter  . Glaucoma   . Glaucoma    left eye   . Joint pain    foot and knee pain  . Lymphedema of arm    left  . Night sweats   . Osteopenia 01/24/2015  . Pain    breast pain  . Paroxysmal atrial fibrillation ([place]) 05/2014  . Sinus problem   . Wears glasses    Past Surgical History:  Procedure Laterality Date  . BREAST SURGERY  2010- right and 2011-left   for cancer  . CARDIOVERSION N/A 10/21/2014   Procedure: CARDIOVERSION;  Surgeon: [name], MD;  Location: [place];  Service: Cardiovascular;  Laterality: N/A;  . COLONOSCOPY  N/A 02/05/2013   Procedure: COLONOSCOPY;  Surgeon: [name], MD;  [facility];  Service: Endoscopy;  Laterality: N/A;  830  . COLONOSCOPY N/A 09/24/2019   Procedure: COLONOSCOPY;  Surgeon: [name], MD;  [facility];  Service: Endoscopy;  Laterality: N/A;  100pm  . ELECTROPHYSIOLOGIC STUDY N/A 02/24/2015   PVI and CTI ablation by Dr [name]  . EP IMPLANTABLE DEVICE N/A 08/24/2015   Procedure: Loop Recorder Insertion;  Surgeon: Thompson Grayer, MD;  Location: [place] CV LAB;  Service: Cardiovascular;  Laterality: N/A;  . HYSTEROSCOPY WITH D & C N/A 12/29/2014   Procedure: DILATATION AND CURETTAGE /HYSTEROSCOPY;  Surgeon: [name], MD;  Location: [facility];  Service: Gynecology;  Laterality: N/A;  . Implantable loop recorder removal  04/29/2019   MDT Reveal LINQ removed by Dr [name] in office  . TEE WITHOUT CARDIOVERSION N/A 02/24/2015   Procedure: TRANSESOPHAGEAL ECHOCARDIOGRAM (TEE);  Surgeon: [name], MD;  Location: [place];  Service: Cardiovascular;  Laterality: N/A;  . TUBAL LIGATION  1984    SOCIAL HISTORY:  Social History   Socioeconomic History  . Marital status: Widowed    Spouse name: Not on file  . Number of children: Not on file  . Years of education: Not on file  . Highest education level: Not on  file  Occupational History  . Not on file  Tobacco Use  . Smoking status: Former Smoker    Packs/day: 1.00    Years: 2.00    Pack years: 2.00    Types: Cigarettes    Start date: 09/17/1962    Quit date: 11/16/1966    Years since quitting: 53.6  . Smokeless tobacco: Never Used  Vaping Use  . Vaping Use: Never used  Substance and Sexual Activity  . Alcohol use: No    Alcohol/week: 0.0 standard drinks  . Drug use: No  . Sexual activity: Not Currently    Birth control/protection: Post-menopausal  Other Topics Concern  . Not on file  Social History Narrative   Lives alone in [place]   Retired   Worked previously for [employer]  tobacco and [employer]   Social Determinants of Radio broadcast assistant Strain:   . Difficulty of Paying Living Expenses: Not on file  Food Insecurity:   . Worried About Charity fundraiser in the Last Year: Not on file  . Ran Out of Food in the Last Year: Not on file  Transportation Needs:   . Lack of Transportation (Medical): Not on file  . Lack of Transportation (Non-Medical): Not on file  Physical Activity:   . Days of Exercise per Week: Not on file  . Minutes of Exercise per Session: Not on file  Stress:   . Feeling of Stress : Not on file  Social Connections:   . Frequency of Communication with Friends and Family: Not on file  . Frequency of Social Gatherings with Friends and Family: Not on file  . Attends Religious Services: Not on file  . Active Member of Clubs or Organizations: Not on file  . Attends Archivist Meetings: Not on file  . Marital Status: Not on file  Intimate Partner Violence:   . Fear of Current or Ex-Partner: Not on file  . Emotionally Abused: Not on file  . Physically Abused: Not on file  . Sexually Abused: Not on file    FAMILY HISTORY:  Family History  Problem Relation Age of Onset  . Heart disease Mother   . Stroke Father   . Cancer Father   . Colon cancer Father   . Early death Brother   . Cancer Sister   . Cancer Sister   . Cancer Sister   . Diabetes Daughter   . Heart disease Son   . Colon polyps Son   . Breast cancer Neg Hx     CURRENT MEDICATIONS:  Current Outpatient Medications  Medication Sig Dispense Refill  . albuterol (PROVENTIL HFA;VENTOLIN HFA) 108 (90 BASE) MCG/ACT inhaler Inhale into the lungs every 6 (six) hours as needed for wheezing or shortness of breath. Reported on 11/28/2015    . brimonidine (ALPHAGAN) 0.2 % ophthalmic solution Place 1 drop into the left eye 2 (two) times daily.     . Calcium Carbonate-Vit D-Min (CALTRATE 600+D PLUS PO) Take 1 tablet by mouth daily.    . Cholecalciferol (VITAMIN D PO) Take  2,000 Units by mouth daily.     . Coenzyme Q10 (COQ-10) 100 MG CAPS Take 100 mg by mouth daily.     . Cranberry 500 MG CAPS Take 500 mg by mouth daily.     Marland Kitchen diltiazem (CARDIZEM CD) 180 MG 24 hr capsule Take 1 capsule by mouth once daily 90 capsule 3  . diltiazem (CARDIZEM) 30 MG tablet TAKE ONE TABLET BY MOUTH EVERY 4  HOURS AS NEEDED FOR HEART RATE >100 AS LONG AS BLOOD PRESSURE >100 (Patient taking differently: Take 30 mg by mouth every 4 (four) hours as needed (increase heart rate and blood pressure). Heart rate>100 as long as blood pressure >100) 45 tablet 3  . ELIQUIS 5 MG TABS tablet Take 1 tablet by mouth twice daily 180 tablet 1  . fluticasone (FLONASE) 50 MCG/ACT nasal spray Place 1 spray into both nostrils daily as needed for rhinitis.     . furosemide (LASIX) 20 MG tablet Take 1 tablet (20 mg total) by mouth daily as needed. Take daily as needed for swelling 90 tablet 0  . letrozole (FEMARA) 2.5 MG tablet Take 1 tablet (2.5 mg total) by mouth daily. 90 tablet 0  . loratadine (CLARITIN) 10 MG tablet Take 10 mg by mouth daily as needed for allergies. Reported on 09/20/2015    . LUMIGAN 0.01 % SOLN Place 1 drop into both eyes at bedtime.    Marland Kitchen MAGNESIUM PO Take 400 mg by mouth daily.    . Misc Natural Products (JOINT SUPPORT COMPLEX PO) Take 40 mg by mouth daily. Per pt she takes an over the counter medication daily for joint pain pt unsure of dosage.  Collagen support UC-II Type II Collagen    . Multiple Vitamin (MULTIVITAMIN) tablet Take 1 tablet by mouth daily.     . Omega-3 Fatty Acids (FISH OIL) 1200 MG CAPS Take 1,200 mg by mouth daily.     . psyllium (METAMUCIL SMOOTH TEXTURE) 58.6 % powder Take 1 packet by mouth daily.    Marland Kitchen SOTALOL AF 80 MG TABS Take 1 tablet by mouth twice daily 180 tablet 3   No current facility-administered medications for this visit.    ALLERGIES:  Allergies  Allergen Reactions  . Metoprolol     Makes her feel bad  . Vesicare [Solifenacin]     Severe  hypertension and tachycardia   . Reglan [Metoclopramide] Other (See Comments)    Causes severe jitters    PHYSICAL EXAM:  Performance status (ECOG): 1 - Symptomatic but completely ambulatory  There were no vitals filed for this visit. Wt Readings from Last 3 Encounters:  06/20/20 213 lb 11.2 oz (96.9 kg)  03/22/20 212 lb (96.2 kg)  12/10/19 210 lb (95.3 kg)   Physical Exam Vitals reviewed.  Constitutional:      Appearance: Normal appearance.  Neurological:     Mental Status: She is alert.  Psychiatric:        Mood and Affect: Mood normal.        Behavior: Behavior normal.      LABORATORY DATA:  I have reviewed the labs as listed.  CBC Latest Ref Rng & Units 06/13/2020 03/22/2020 12/03/2019  WBC 4.0 - 10.5 K/uL 7.8 8.1 8.8  Hemoglobin 12.0 - 15.0 g/dL 13.7 13.9 13.7  Hematocrit 36 - 46 % 41.2 40.0 41.9  Platelets 150 - 400 K/uL 213 198 221   CMP Latest Ref Rng & Units 06/13/2020 03/22/2020 12/03/2019  Glucose 70 - 99 mg/dL 111(H) 90 114(H)  BUN 8 - 23 mg/dL _0 Creatinine 0.44 - 1.00 mg/dL 0.72 0.75 0.68  Sodium 135 - 145 mmol/L 134(L) 139 137  Potassium 3.5 - 5.1 mmol/L 4.6 4.5 4.5  Chloride 98 - 111 mmol/L 104 102 104  CO2 22 - 32 mmol/L 21(L) 20 23  Calcium 8.9 - 10.3 mg/dL 9.0 9.6 9.1  Total Protein 6.5 - 8.1 g/dL 6.6 - 6.8  Total Bilirubin 0.3 - 1.2 mg/dL 0.7 - 0.5  Alkaline Phos 38 - 126 U/L 72 - 73  AST 15 - 41 U/L 43(H) - 38  ALT 0 - 44 U/L 59(H) - 47(H)    DIAGNOSTIC IMAGING:  I have independently reviewed the scans and discussed with the patient. DG Bone Density  Result Date: 06/13/2020 EXAM: DUAL X-RAY ABSORPTIOMETRY (DXA) FOR BONE MINERAL DENSITY IMPRESSION: Your patient Dorsie Sethi completed a BMD test on 06/13/2020 using the [employer] (software version: 14.10) manufactured by [employer]. The following summarizes the results of our evaluation. Technologist:[facility] PATIENT BIOGRAPHICAL: Name: Gioia, Ranes Patient ID:  [id number] Birth Date: 1945/10/04 Height: 63.0 in. Gender: Female Exam Date: 06/13/2020 Weight: 212.0 lbs. Indications: Chronic Steroid Use, Follow up Osteopenia, Hx Breast Ca, Height Loss, Secondary Osteoporosis, Post Menopausal, Caucasian Fractures: Treatments: Vitamin D, Femara, Multivitamin, Calcium DENSITOMETRY RESULTS: Site         Region     Measured Date Measured Age WHO Classification Young Adult T-score BMD         %Change vs. Previous Significant Change (*) DualFemur Neck Left 06/13/2020 75.0 Osteopenia -1.9 0.769 g/cm2 -8.2% Yes DualFemur Neck Left 11/27/2017 72.5 Osteopenia -1.4 0.838 g/cm2 -1.2% - DualFemur Neck Left 11/22/2016 71.5 Osteopenia -1.4 0.848 g/cm2 2.8% - DualFemur Neck Left 02/15/2015 69.7 Osteopenia -1.5 0.825 g/cm2 -7.6% Yes DualFemur Neck Left 06/30/2012 67.1 Normal -1.0 0.893 g/cm2 0.2% - DualFemur Neck Left 05/24/2009 64.0 Osteopenia -1.1 0.891 g/cm2 - - DualFemur Total Mean 06/13/2020 75.0 Normal -0.7 0.919 g/cm2 -2.8% Yes DualFemur Total Mean 11/27/2017 72.5 - - 0.945 g/cm2 -0.1% - DualFemur Total Mean 11/22/2016 71.5 Normal -0.5 0.946 g/cm2 0.0% - DualFemur Total Mean 02/15/2015 69.7 Normal -0.5 0.946 g/cm2 -0.9% - DualFemur Total Mean 06/30/2012 67.1 Normal -0.4 0.955 g/cm2 5.3% Yes DualFemur Total Mean 05/24/2009 64.0 Normal -0.8 0.907 g/cm2 - - Left Forearm Radius 33% 06/13/2020 75.0 Osteopenia -2.1 0.563 g/cm2 -3.2% - Left Forearm Radius 33% 11/27/2017 72.5 Osteopenia -1.9 0.581 g/cm2 4.3% - Left Forearm Radius 33% 11/22/2016 71.5 Osteopenia -2.2 0.557 g/cm2 - - ASSESSMENT: The BMD measured at Forearm Radius 33% is 0.563 g/cm2 with a T-score of -2.1. This patient is considered osteopenic according to [facility]) criteria. The scan quality is good. Compared with the prior study on 11/27/2017, the BMD of the total mean shows a statistically significant decrease. Lumbar spine was excluded due to advanced degenerative changes. World Pharmacologist [facility])  criteria for post-menopausal, Caucasian Women: Normal:       T-score at or above -1 SD Osteopenia:   T-score between -1 and -2.5 SD Osteoporosis: T-score at or below -2.5 SD RECOMMENDATIONS: 1. All patients should optimize calcium and vitamin D intake. 2. Consider FDA-approved medical therapies in postmenopausal women and med aged 48 years and older, based on the following: a. A hip or vertebral (clinical or morphometric) fracture b. T-score< -2.5 at the femoral neck or spine after appropriate evaluation to exclude secondary causes c. Low bone mass (T-score between -1.0 and -2.5 at the femoral neck or spine) and a 10-year probability of a hip fracture > 3% or a 10-year probability of a major osteoporosis-related fracture > 20% based on the [place]-adapted WHO algorithm d. Clinician judgment and/or patient preferences may indicate treatment for people with 10-year fracture probabilities above or below these levels FOLLOW-UP: People with diagnosed cases of osteoporosis or at high risk for fracture should have regular bone mineral density tests. For patients eligible  for Medicare, routine testing is allowed once every 2 years. The testing frequency can be increased to one year for patients who have rapidly progressing disease, those who are receiving or discontinuing medical therapy to restore bone mass, or have additional risk factors. I have reviewed this report, and agree with the above findings. [facility] Your patient AHMIA COLFORD completed a FRAX assessment on 06/13/2020 using the [place] (analysis version: 14.10) manufactured by [employer]. The following summarizes the results of our evaluation. PATIENT BIOGRAPHICAL: Name: Shalayne, Leach Patient ID: [id number] Birth Date: Jun 23, 1946 Height:    63.0 in. Gender:     Female    [age]       Weight:    212.0 lbs. Ethnicity:  White                            Exam Date: 06/13/2020 FRAX* RESULTS:  (version: 3.5) 10-year  Probability of Fracture1 Major Osteoporotic Fracture2 Hip Fracture 11.6% 2.7% Population: [place] (Caucasian) Risk Factors: Secondary Osteoporosis Based on DualFemur (Left) Neck BMD 1 -The 10-year probability of fracture may be lower than reported if the patient has received treatment. 2 -Major Osteoporotic Fracture: Clinical Spine, Forearm, Hip or Shoulder *FRAX is a Materials engineer of the [employer] for Metabolic Bone Disease, a [place] (WHO) [facility]. ASSESSMENT: The probability of a major osteoporotic fracture is 11.6% within the next ten years. The probability of a hip fracture is 2.7% within the next ten years. Electronically Signed   By: Lowella Grip III M.D.   On: 06/13/2020 13:27     ASSESSMENT:  1. Right breast infiltrating lobular carcinoma: -Status post right lumpectomy and lymph node biopsy on 04/14/2009, pT1b, PN 0, ER/PR positive, Ki-67 8%, margins negative, grade 1, 0.6 cm. -Patient was recommended tamoxifen which she took for approximately 4 years. This was discontinued secondary to endometrial hyperplasia. -Breast cancer index testing shows high likelihood of benefit. -She was subsequently switched to anastrozole and could not tolerate secondary to musculoskeletal symptoms. -She is currently taking Femara without any major problems. -Mammogram on 06/08/2020 was BI-RADS Category 1. -CT abdomen on 07/12/2020 did not show any evidence of fatty liver or meta stasis.  2. Left breast IDC: -She had a left breast lumpectomy and lymph node biopsy on 04/26/2010. Pathology showed 1.7 cm, grade 1, with low-grade DCIS, margins negative. PT1CPN0. Ki-67 was 9%. ER/PR positive and HER-2 negative. -Bilateral lumpectomy sites within normal limits. Bilateral breast has no palpable masses.  3. Osteopenia: -DEXA scan on 11/27/2017 shows T score of -1.9. This has improved from a T score of -2.3 on 11/22/2016. -She was offered  Prolia but she refused it given the side effects.   PLAN:  1.  Right breast ILC and left breast IDC: -She took her last Femara tablet yesterday.  Mammogram on 06/08/2020 was benign. -Reviewed CT of the abdomen with contrast from 07/12/2020 with no evidence of liver metastasis.  No evidence of fatty liver.  Spleen is normal.  No adenopathy. -I have recommended follow-up in 1 year with repeat labs and mammogram.  2.  Osteopenia: -Vitamin D was normal.  Continue vitamin D 3000 units daily.  Will check level in 1 year.   Orders placed this encounter:  No orders of the defined types were placed in this encounter.    Derek Jack, MD [facility] [phone]   Saunders Revel, am acting as a scribe for Dr. Sanda Linger.  I, Derek Jack MD, have reviewed the above documentation for accuracy and completeness, and I agree with the above.

## 2020-07-13 ENCOUNTER — Inpatient Hospital Stay (HOSPITAL_BASED_OUTPATIENT_CLINIC_OR_DEPARTMENT_OTHER): Payer: Medicare Other | Admitting: Hematology

## 2020-07-13 VITALS — BP 153/97 | HR 88 | Temp 97.2°F | Resp 18 | Wt 204.6 lb

## 2020-07-13 DIAGNOSIS — Z23 Encounter for immunization: Secondary | ICD-10-CM

## 2020-07-13 DIAGNOSIS — Z79811 Long term (current) use of aromatase inhibitors: Secondary | ICD-10-CM | POA: Diagnosis not present

## 2020-07-13 DIAGNOSIS — C50911 Malignant neoplasm of unspecified site of right female breast: Secondary | ICD-10-CM | POA: Diagnosis not present

## 2020-07-13 DIAGNOSIS — Z853 Personal history of malignant neoplasm of breast: Secondary | ICD-10-CM | POA: Diagnosis not present

## 2020-07-13 DIAGNOSIS — Z170 Estrogen receptor positive status [ER+]: Secondary | ICD-10-CM | POA: Diagnosis not present

## 2020-07-13 DIAGNOSIS — M8580 Other specified disorders of bone density and structure, unspecified site: Secondary | ICD-10-CM | POA: Diagnosis not present

## 2020-07-13 DIAGNOSIS — C50912 Malignant neoplasm of unspecified site of left female breast: Secondary | ICD-10-CM | POA: Diagnosis not present

## 2020-07-13 MED ORDER — INFLUENZA VAC A&B SA ADJ QUAD 0.5 ML IM PRSY
0.5000 mL | PREFILLED_SYRINGE | Freq: Once | INTRAMUSCULAR | Status: AC
  Administered 2020-07-13: 0.5 mL via INTRAMUSCULAR
  Filled 2020-07-13: qty 0.5

## 2020-07-21 DIAGNOSIS — Z853 Personal history of malignant neoplasm of breast: Secondary | ICD-10-CM | POA: Diagnosis not present

## 2020-08-06 DIAGNOSIS — Z23 Encounter for immunization: Secondary | ICD-10-CM | POA: Diagnosis not present

## 2020-09-28 ENCOUNTER — Ambulatory Visit: Payer: Medicare Other | Admitting: Internal Medicine

## 2020-10-17 ENCOUNTER — Ambulatory Visit: Payer: Medicare Other | Admitting: Internal Medicine

## 2020-10-31 ENCOUNTER — Other Ambulatory Visit: Payer: Self-pay

## 2020-10-31 ENCOUNTER — Ambulatory Visit (INDEPENDENT_AMBULATORY_CARE_PROVIDER_SITE_OTHER): Payer: Medicare Other | Admitting: Internal Medicine

## 2020-10-31 ENCOUNTER — Other Ambulatory Visit: Payer: Self-pay | Admitting: *Deleted

## 2020-10-31 ENCOUNTER — Encounter: Payer: Self-pay | Admitting: *Deleted

## 2020-10-31 ENCOUNTER — Ambulatory Visit (INDEPENDENT_AMBULATORY_CARE_PROVIDER_SITE_OTHER): Payer: Medicare Other

## 2020-10-31 ENCOUNTER — Encounter: Payer: Self-pay | Admitting: Internal Medicine

## 2020-10-31 VITALS — BP 110/68 | HR 81 | Ht 63.0 in | Wt 204.4 lb

## 2020-10-31 DIAGNOSIS — I480 Paroxysmal atrial fibrillation: Secondary | ICD-10-CM | POA: Diagnosis not present

## 2020-10-31 DIAGNOSIS — I484 Atypical atrial flutter: Secondary | ICD-10-CM

## 2020-10-31 DIAGNOSIS — I10 Essential (primary) hypertension: Secondary | ICD-10-CM | POA: Diagnosis not present

## 2020-10-31 DIAGNOSIS — I4891 Unspecified atrial fibrillation: Secondary | ICD-10-CM

## 2020-10-31 DIAGNOSIS — I495 Sick sinus syndrome: Secondary | ICD-10-CM | POA: Diagnosis not present

## 2020-10-31 NOTE — Progress Notes (Signed)
Patient ID: Heidi Lopez, female   DOB: Oct 23, 1945, 75 y.o.   MRN: [id number] Patient enrolled for [employer] to ship a 14 day ZIO XT long term holter monitor to her home.

## 2020-10-31 NOTE — Progress Notes (Signed)
PCP: [name], MD   Primary EP: Dr [name]  Heidi Lopez is a 75 y.o. female who presents today for routine electrophysiology followup.  Since last being seen in our clinic, the patient reports doing reasonably well.  She has rare palpitations but worsening exertional fatigue, SOB, and decreased exercise tolerance.  Today, she denies symptoms of palpitations, chest pain,   lower extremity edema, dizziness, presyncope, or syncope.  The patient is otherwise without complaint today.   Past Medical History:  Diagnosis Date  . Breast cancer (Heidi Lopez)   . Cancer of breast [facility]) July 2010   s/p XRT 2010 and again in 2011  . Chronic coughing   . Cramps, muscle, general    severe breast cramping   . Glaucoma   . Glaucoma    left eye   . Joint pain    foot and knee pain  . Lymphedema of arm    left  . Night sweats   . Osteopenia 01/24/2015  . Pain    breast pain  . Persistent atrial fibrillation ([place]) 05/2014  . Sinus problem   . Wears glasses    Past Surgical History:  Procedure Laterality Date  . BREAST SURGERY  2010- right and 2011-left   for cancer  . CARDIOVERSION N/A 10/21/2014   Procedure: CARDIOVERSION;  Surgeon: [name], MD;  Location: [place];  Service: Cardiovascular;  Laterality: N/A;  . COLONOSCOPY N/A 02/05/2013   Procedure: COLONOSCOPY;  Surgeon: [name], MD;  [facility];  Service: Endoscopy;  Laterality: N/A;  830  . COLONOSCOPY N/A 09/24/2019   Procedure: COLONOSCOPY;  Surgeon: [name], MD;  [facility];  Service: Endoscopy;  Laterality: N/A;  100pm  . ELECTROPHYSIOLOGIC STUDY N/A 02/24/2015   PVI and CTI ablation by Dr [name]  . EP IMPLANTABLE DEVICE N/A 08/24/2015   Procedure: Loop Recorder Insertion;  Surgeon: Thompson Grayer, MD;  Location: [place] CV LAB;  Service: Cardiovascular;  Laterality: N/A;  . HYSTEROSCOPY WITH D & C N/A 12/29/2014   Procedure: DILATATION AND CURETTAGE /HYSTEROSCOPY;  Surgeon: [name], MD;  [facility];  Service: Gynecology;  Laterality: N/A;  . Implantable loop recorder removal  04/29/2019   MDT Reveal LINQ removed by Dr [name] in office  . TEE WITHOUT CARDIOVERSION N/A 02/24/2015   Procedure: TRANSESOPHAGEAL ECHOCARDIOGRAM (TEE);  Surgeon: [name], MD;  Location: [place];  Service: Cardiovascular;  Laterality: N/A;  . TUBAL LIGATION  1984    ROS- all systems are reviewed and negatives except as per HPI above  Current Outpatient Medications  Medication Sig Dispense Refill  . albuterol (PROVENTIL HFA;VENTOLIN HFA) 108 (90 BASE) MCG/ACT inhaler Inhale into the lungs every 6 (six) hours as needed for wheezing or shortness of breath. Reported on 11/28/2015    . brimonidine (ALPHAGAN) 0.2 % ophthalmic solution Place 1 drop into the left eye 2 (two) times daily.     . Calcium Carbonate-Vit D-Min (CALTRATE 600+D PLUS PO) Take 1 tablet by mouth daily.    . Cholecalciferol (VITAMIN D PO) Take 2,000 Units by mouth daily.    . Coenzyme Q10 (COQ-10) 100 MG CAPS Take 100 mg by mouth daily.     . Cranberry 500 MG CAPS Take 500 mg by mouth daily.    Marland Kitchen diltiazem (CARDIZEM CD) 180 MG 24 hr capsule Take 1 capsule by mouth once daily 90 capsule 3  . diltiazem (CARDIZEM) 30 MG tablet TAKE ONE  TABLET BY MOUTH EVERY 4 HOURS AS NEEDED FOR HEART RATE >100 AS LONG AS BLOOD PRESSURE >100 (Patient taking differently: Take 30 mg by mouth every 4 (four) hours as needed (increase heart rate and blood pressure). Heart rate>100 as long as blood pressure >100) 45 tablet 3  . ELIQUIS 5 MG TABS tablet Take 1 tablet by mouth twice daily 180 tablet 1  . fluticasone (FLONASE) 50 MCG/ACT nasal spray Place 1 spray into both nostrils daily as needed for rhinitis.     . furosemide (LASIX) 20 MG tablet Take 1 tablet (20 mg total) by mouth daily as needed. Take daily as needed for swelling 90 tablet 0  . loratadine (CLARITIN) 10 MG tablet Take 10 mg by mouth daily as needed for allergies. Reported  on 09/20/2015    . LUMIGAN 0.01 % SOLN Place 1 drop into both eyes at bedtime.    Marland Kitchen MAGNESIUM PO Take 400 mg by mouth daily.    . Misc Natural Products (JOINT SUPPORT COMPLEX PO) Take 40 mg by mouth daily. Per pt she takes an over the counter medication daily for joint pain pt unsure of dosage.  Collagen support UC-II Type II Collagen    . Multiple Vitamin (MULTIVITAMIN) tablet Take 1 tablet by mouth daily.     . Omega-3 Fatty Acids (FISH OIL) 1200 MG CAPS Take 1,200 mg by mouth daily.     . psyllium (METAMUCIL SMOOTH TEXTURE) 58.6 % powder Take 1 packet by mouth daily.    Marland Kitchen SOTALOL AF 80 MG TABS Take 1 tablet by mouth twice daily 180 tablet 3   No current facility-administered medications for this visit.    Physical Exam: Vitals:   10/31/20 1211  BP: 110/68  Pulse: 81  SpO2: 96%  Weight: 204 lb 6.4 oz (92.7 kg)  Height: 5\' 3"  (1.6 m)    GEN- The patient is well appearing, alert and oriented x 3 today.   Head- normocephalic, atraumatic Eyes-  Sclera clear, conjunctiva pink Ears- hearing intact Oropharynx- clear Lungs- Clear to ausculation bilaterally, normal work of breathing Heart- irregular rate and rhythm, no murmurs, rubs or gallops, PMI not laterally displaced GI- soft, NT, ND, + BS Extremities- no clubbing, cyanosis, or edema  Wt Readings from Last 3 Encounters:  10/31/20 204 lb 6.4 oz (92.7 kg)  07/13/20 204 lb 9.6 oz (92.8 kg)  06/20/20 213 lb 11.2 oz (96.9 kg)    EKG tracing ordered today is personally reviewed and shows afib  Assessment and Plan:  1. Paroxysmal atrial fibrillation/ atypical atrial flutter--> appears to now be persistently in afib.  She is s/p ablation (2016) and remains on sotalol. I think that this is the cause for her symptoms of fatigue and decreased exercise tolerance and have advised change in medicine or repeat ablation.  She is resistant to change today.  She is considering rate control as a long term strategy. chads2vasc score is 3.  She  is on eliquis Echo is ordered I will place 14 day Zio.  If she is persistently in afib, then I would advise tikosyn, amiodarone, or ablation as options.  I do think that she would be a very good ablation candidate though she is not currently interested. If she opts for rate control then we should probably stop sotalol on follow-up in the AF clinic in 2 months  2. HTN Stable No change required today  3. Sinus pauses No recent episodes or symptoms Previously declined pacing and episodes resolved s/p ablation  Risks, benefits and potential toxicities for medications prescribed and/or refilled reviewed with patient today.   Return to AF clinic in 2 months  Thompson Grayer MD, [facility] 10/31/2020 1:11 PM

## 2020-10-31 NOTE — Patient Instructions (Addendum)
Medication Instructions:  Your physician recommends that you continue on your current medications as directed. Please refer to the Current Medication list given to you today.  Labwork: None ordered.  Testing/Procedures: please schedule Echo.  Your physician has requested that you have an echocardiogram. Echocardiography is a painless test that uses sound waves to create images of your heart. It provides your doctor with information about the size and shape of your heart and how well your heart's chambers and valves are working. This procedure takes approximately one hour. There are no restrictions for this procedure.  Your physician has recommended that you wear a heart monitor. Holter monitors are medical devices that record the heart's electrical activity. Doctors most often use these monitors to diagnose arrhythmias. Arrhythmias are problems with the speed or rhythm of the heartbeat. The monitor is a small, portable device. You can wear one while you do your normal daily activities. This is usually used to diagnose what is causing palpitations/syncope (passing out).   Follow-Up: Your physician wants you to follow-up in: 6 to 8 weeks with the Afib clinic.   Any Other Special Instructions Will Be Listed Below (If Applicable).  If you need a refill on your cardiac medications before your next appointment, please call your pharmacy.   ZIO XT- Long Term Monitor Instructions   Your physician has requested you wear your ZIO patch monitor_14____days.   This is a single patch monitor.  [employer] supplies one patch monitor per enrollment.  Additional stickers are not available.   Please do not apply patch if you will be having a Nuclear Stress Test, Echocardiogram, Cardiac CT, MRI, or Chest Xray during the time frame you would be wearing the monitor. The patch cannot be worn during these tests.  You cannot remove and re-apply the ZIO XT patch monitor.   Your ZIO patch monitor will be sent USPS  Priority mail from [facility] directly to your home address. The monitor may also be mailed to a PO BOX if home delivery is not available.   It may take 3-5 days to receive your monitor after you have been enrolled.   Once you have received you monitor, please review enclosed instructions.  Your monitor has already been registered assigning a specific monitor serial # to you.   Applying the monitor   Shave hair from upper left chest.   Hold abrader disc by orange tab.  Rub abrader in 40 strokes over left upper chest as indicated in your monitor instructions.   Clean area with 4 enclosed alcohol pads .  Use all pads to assure are is cleaned thoroughly.  Let dry.   Apply patch as indicated in monitor instructions.  Patch will be place under collarbone on left side of chest with arrow pointing upward.   Rub patch adhesive wings for 2 minutes.Remove white label marked "1".  Remove white label marked "2".  Rub patch adhesive wings for 2 additional minutes.   While looking in a mirror, press and release button in center of patch.  A small green light will flash 3-4 times .  This will be your only indicator the monitor has been turned on.     Do not shower for the first 24 hours.  You may shower after the first 24 hours.   Press button if you feel a symptom. You will hear a small click.  Record Date, Time and Symptom in the Patient Log Book.   When you are ready to remove patch, follow instructions  on last 2 pages of Patient Log Book.  Stick patch monitor onto last page of Patient Log Book.   Place Patient Log Book in [place] box.  Use locking tab on box and tape box closed securely.  The [employer] has [employer] on it.  Please place in mailbox as soon as possible.  Your physician should have your test results approximately 7 days after the monitor has been mailed back to [facility].   Call [place] at [phone] if you have questions regarding  your ZIO XT patch monitor.  Call them immediately if you see an orange light blinking on your monitor.   If your monitor falls off in less than 4 days contact our Monitor department at [phone].  If your monitor becomes loose or falls off after 4 days call [employer] at [phone] for suggestions on securing your monitor.

## 2020-11-10 ENCOUNTER — Ambulatory Visit (HOSPITAL_COMMUNITY)
Admission: RE | Admit: 2020-11-10 | Discharge: 2020-11-10 | Disposition: A | Discharge status: Home / Self Care | Payer: Medicare Other | Source: Ambulatory Visit | Attending: Internal Medicine | Admitting: Internal Medicine

## 2020-11-10 ENCOUNTER — Other Ambulatory Visit: Payer: Self-pay

## 2020-11-10 DIAGNOSIS — I480 Paroxysmal atrial fibrillation: Secondary | ICD-10-CM | POA: Diagnosis not present

## 2020-11-10 DIAGNOSIS — I495 Sick sinus syndrome: Secondary | ICD-10-CM | POA: Insufficient documentation

## 2020-11-10 DIAGNOSIS — I4891 Unspecified atrial fibrillation: Secondary | ICD-10-CM

## 2020-11-10 DIAGNOSIS — I484 Atypical atrial flutter: Secondary | ICD-10-CM | POA: Insufficient documentation

## 2020-11-10 DIAGNOSIS — I10 Essential (primary) hypertension: Secondary | ICD-10-CM | POA: Diagnosis not present

## 2020-11-10 LAB — ECHOCARDIOGRAM COMPLETE
Area-P 1/2: 4.89 cm2
S' Lateral: 2.9 cm

## 2020-11-10 NOTE — Progress Notes (Signed)
*  PRELIMINARY RESULTS* Echocardiogram 2D Echocardiogram has been performed.  Heidi Lopez 11/10/2020, 3:47 PM

## 2020-11-26 DIAGNOSIS — I4891 Unspecified atrial fibrillation: Secondary | ICD-10-CM | POA: Diagnosis not present

## 2020-11-26 DIAGNOSIS — I484 Atypical atrial flutter: Secondary | ICD-10-CM | POA: Diagnosis not present

## 2020-12-12 ENCOUNTER — Other Ambulatory Visit: Payer: Self-pay

## 2020-12-12 ENCOUNTER — Encounter (HOSPITAL_COMMUNITY): Payer: Self-pay | Admitting: Nurse Practitioner

## 2020-12-12 ENCOUNTER — Ambulatory Visit (HOSPITAL_COMMUNITY)
Admission: RE | Admit: 2020-12-12 | Discharge: 2020-12-12 | Disposition: A | Discharge status: Home / Self Care | Payer: Medicare Other | Source: Ambulatory Visit | Attending: Nurse Practitioner | Admitting: Nurse Practitioner

## 2020-12-12 VITALS — BP 118/70 | HR 89 | Ht 63.0 in | Wt 202.0 lb

## 2020-12-12 DIAGNOSIS — Z87891 Personal history of nicotine dependence: Secondary | ICD-10-CM | POA: Diagnosis not present

## 2020-12-12 DIAGNOSIS — Z79899 Other long term (current) drug therapy: Secondary | ICD-10-CM | POA: Diagnosis not present

## 2020-12-12 DIAGNOSIS — I4819 Other persistent atrial fibrillation: Secondary | ICD-10-CM | POA: Insufficient documentation

## 2020-12-12 DIAGNOSIS — Z8249 Family history of ischemic heart disease and other diseases of the circulatory system: Secondary | ICD-10-CM | POA: Insufficient documentation

## 2020-12-12 DIAGNOSIS — I10 Essential (primary) hypertension: Secondary | ICD-10-CM | POA: Insufficient documentation

## 2020-12-12 DIAGNOSIS — Z7901 Long term (current) use of anticoagulants: Secondary | ICD-10-CM | POA: Diagnosis not present

## 2020-12-12 DIAGNOSIS — D6869 Other thrombophilia: Secondary | ICD-10-CM | POA: Diagnosis not present

## 2020-12-12 NOTE — Progress Notes (Signed)
Primary Care Physician: [name], MD Referring Physician: Dr. Melburn Hake Heidi Lopez is a 75 y.o. female with a h/o paroxysmal afib that is in the afib clinic for f/u of Dr. Jackalyn Lombard visit, 10/31/20. She is s/p ablation in 2016 and appeared to be in persistent  afib. He placed a 2 week ZIo patch which confirmed afib burden of 100%. He gave her the option of stopping sotalol and consideration of amiodarone or Tikosyn or repeat  ablation. The pt  would like to defer repeat ablation. SHe feels that her afib is not limiting her lifestyle . She is preferring  rate controlled afib. She  has some mild liver elevations at baseline, which have been worked up, but amiodarone may not be the best option in the  case if she chose another antiarrythmic. She has seen some fluctuations in her BP at home.   Today, she denies symptoms of palpitations, chest pain, shortness of breath, orthopnea, PND, lower extremity edema, dizziness, presyncope, syncope, or neurologic sequela. The patient is tolerating medications without difficulties and is otherwise without complaint today.   Past Medical History:  Diagnosis Date  . Breast cancer ([place])   . Cancer of breast [facility]) July 2010   s/p XRT 2010 and again in 2011  . Chronic coughing   . Cramps, muscle, general    severe breast cramping   . Glaucoma   . Glaucoma    left eye   . Joint pain    foot and knee pain  . Lymphedema of arm    left  . Night sweats   . Osteopenia 01/24/2015  . Pain    breast pain  . Persistent atrial fibrillation ([place]) 05/2014  . Sinus problem   . Wears glasses    Past Surgical History:  Procedure Laterality Date  . BREAST SURGERY  2010- right and 2011-left   for cancer  . CARDIOVERSION N/A 10/21/2014   Procedure: CARDIOVERSION;  Surgeon: [name], MD;  Location: [place];  Service: Cardiovascular;  Laterality: N/A;  . COLONOSCOPY N/A 02/05/2013   Procedure: COLONOSCOPY;  Surgeon: [name], MD;  [facility];  Service: Endoscopy;  Laterality: N/A;  830  . COLONOSCOPY N/A 09/24/2019   Procedure: COLONOSCOPY;  Surgeon: [name], MD;  [facility];  Service: Endoscopy;  Laterality: N/A;  100pm  . ELECTROPHYSIOLOGIC STUDY N/A 02/24/2015   PVI and CTI ablation by Dr [name]  . EP IMPLANTABLE DEVICE N/A 08/24/2015   Procedure: Loop Recorder Insertion;  Surgeon: Thompson Grayer, MD;  Location: [place] CV LAB;  Service: Cardiovascular;  Laterality: N/A;  . HYSTEROSCOPY WITH D & C N/A 12/29/2014   Procedure: DILATATION AND CURETTAGE /HYSTEROSCOPY;  Surgeon: [name], MD;  [facility];  Service: Gynecology;  Laterality: N/A;  . Implantable loop recorder removal  04/29/2019   MDT Reveal LINQ removed by Dr [name] in office  . TEE WITHOUT CARDIOVERSION N/A 02/24/2015   Procedure: TRANSESOPHAGEAL ECHOCARDIOGRAM (TEE);  Surgeon: [name], MD;  Location: [facility];  Service: Cardiovascular;  Laterality: N/A;  . TUBAL LIGATION  1984    Current Outpatient Medications  Medication Sig Dispense Refill  . Calcium Carbonate-Vit D-Min (CALTRATE 600+D PLUS PO) Take 1 tablet by mouth daily.    . Cholecalciferol (VITAMIN D PO) Take 2,000 Units by mouth daily.    . Coenzyme Q10 (COQ-10) 100 MG CAPS Take 100 mg by mouth daily.     Marland Kitchen  diltiazem (CARDIZEM CD) 180 MG 24 hr capsule Take 1 capsule by mouth once daily 90 capsule 3  . ELIQUIS 5 MG TABS tablet Take 1 tablet by mouth twice daily 180 tablet 1  . fluticasone (FLONASE) 50 MCG/ACT nasal spray Place 1 spray into both nostrils daily as needed for rhinitis.     Marland Kitchen MAGNESIUM PO Take 400 mg by mouth daily.    . Multiple Vitamin (MULTIVITAMIN) tablet Take 1 tablet by mouth daily.     . Omega-3 Fatty Acids (FISH OIL) 1200 MG CAPS Take 1,200 mg by mouth daily.     Marland Kitchen albuterol (PROVENTIL HFA;VENTOLIN HFA) 108 (90 BASE) MCG/ACT inhaler Inhale into the lungs every 6 (six) hours as needed for wheezing or shortness of breath. Reported on  11/28/2015    . brimonidine (ALPHAGAN) 0.2 % ophthalmic solution Place 1 drop into the left eye 2 (two) times daily.     . Cranberry 500 MG CAPS Take 500 mg by mouth daily. (Patient not taking: Reported on 12/12/2020)    . diltiazem (CARDIZEM) 30 MG tablet TAKE ONE TABLET BY MOUTH EVERY 4 HOURS AS NEEDED FOR HEART RATE >100 AS LONG AS BLOOD PRESSURE >100 (Patient not taking: Reported on 12/12/2020) 45 tablet 3  . furosemide (LASIX) 20 MG tablet Take 1 tablet (20 mg total) by mouth daily as needed. Take daily as needed for swelling (Patient not taking: Reported on 12/12/2020) 90 tablet 0  . loratadine (CLARITIN) 10 MG tablet Take 10 mg by mouth daily as needed for allergies. Reported on 09/20/2015 (Patient not taking: Reported on 12/12/2020)    . LUMIGAN 0.01 % SOLN Place 1 drop into both eyes at bedtime.    . Misc Natural Products (JOINT SUPPORT COMPLEX PO) Take 40 mg by mouth daily. Per pt she takes an over the counter medication daily for joint pain pt unsure of dosage.  Collagen support UC-II Type II Collagen (Patient not taking: Reported on 12/12/2020)    . psyllium (METAMUCIL SMOOTH TEXTURE) 58.6 % powder Take 1 packet by mouth daily.     No current facility-administered medications for this encounter.    Allergies  Allergen Reactions  . Metoprolol     Makes her feel bad  . Vesicare [Solifenacin]     Severe hypertension and tachycardia   . Reglan [Metoclopramide] Other (See Comments)    Causes severe jitters    Social History   Socioeconomic History  . Marital status: Widowed    Spouse name: Not on file  . Number of children: Not on file  . Years of education: Not on file  . Highest education level: Not on file  Occupational History  . Not on file  Tobacco Use  . Smoking status: Former Smoker    Packs/day: 1.00    Years: 2.00    Pack years: 2.00    Types: Cigarettes    Start date: 09/17/1962    Quit date: 11/16/1966    Years since quitting: 54.1  . Smokeless tobacco: Never Used   Vaping Use  . Vaping Use: Never used  Substance and Sexual Activity  . Alcohol use: No    Alcohol/week: 0.0 standard drinks  . Drug use: No  . Sexual activity: Not Currently    Birth control/protection: Post-menopausal  Other Topics Concern  . Not on file  Social History Narrative   Lives alone in [place]   Retired   Worked previously for [employer] and [employer]   Social Determinants of Health  Financial Resource Strain: Not on file  Food Insecurity: Not on file  Transportation Needs: Not on file  Physical Activity: Not on file  Stress: Not on file  Social Connections: Not on file  Intimate Partner Violence: Not on file    Family History  Problem Relation Age of Onset  . Heart disease Mother   . Stroke Father   . Cancer Father   . Colon cancer Father   . Early death Brother   . Cancer Sister   . Cancer Sister   . Cancer Sister   . Diabetes Daughter   . Heart disease Son   . Colon polyps Son   . Breast cancer Neg Hx     ROS- All systems are reviewed and negative except as per the HPI above  Physical Exam: Vitals:   12/12/20 1548  BP: 118/70  Pulse: 89  Weight: 91.6 kg  Height: 5\' 3"  (1.6 m)   Wt Readings from Last 3 Encounters:  12/12/20 91.6 kg  10/31/20 92.7 kg  07/13/20 92.8 kg    Labs: Lab Results  Component Value Date   NA 134 (L) 06/13/2020   K 4.6 06/13/2020   CL 104 06/13/2020   CO2 21 (L) 06/13/2020   GLUCOSE 111 (H) 06/13/2020   BUN 13 06/13/2020   CREATININE 0.72 06/13/2020   CALCIUM 9.0 06/13/2020   MG 2.0 06/13/2020   Lab Results  Component Value Date   INR 1.21 12/24/2014   No results found for: CHOL, HDL, LDLCALC, TRIG   GEN- The patient is well appearing, alert and oriented x 3 today.   Head- normocephalic, atraumatic Eyes-  Sclera clear, conjunctiva pink Ears- hearing intact Oropharynx- clear Neck- supple, no JVP Lymph- no cervical lymphadenopathy Lungs- Clear to ausculation bilaterally, normal work of  breathing Heart- irregular rate and rhythm, no murmurs, rubs or gallops, PMI not laterally displaced GI- soft, NT, ND, + BS Extremities- no clubbing, cyanosis, or edema MS- no significant deformity or atrophy Skin- no rash or lesion Psych- euthymic mood, full affect Neuro- strength and sensation are intact  EKG- afib at 89 bpm, qrs int 90 ms, qtc 476 ms   Zio patch- 2 Ventricular Tachycardia runs occurred, the run with the fastest interval lasting 12 beats with a max rate of 176 bpm (avg 148 bpm); the run with the fastest interval was also the longest. Atrial Fibrillation occurred continuously (100% burden), ranging  from 34-155 bpm (avg of 75 bpm). 133 Pauses occurred, the longest lasting 4.2 secs (14 bpm). Isolated VEs were rare (<1.0%, 1020), VE Couplets were rare (<1.0%, 72), and VE Triplets were rare (<1.0%, 4).           Assessment and Plan: 1. Persistent  afib Pt given options of change in antiarrythmic vrs ablation vrs rate control, opts today to stop sotalol and remain in  rate controlled afib Continue diltiazem 180 mg daily   2. CHA2DS2VASc score of at least 3 Continue  eliquis 5 mg bid   3. HTN Stable in office  Sees some elevation in the pm She will keep a log of BP's over the next 10 days  F/u in afib clinic in 1-2 weeks   Butch Penny C. Jaylyne Breese, [facility] [address] [place], [place] [address] [phone]

## 2020-12-12 NOTE — Patient Instructions (Signed)
Stop sotalol

## 2020-12-14 ENCOUNTER — Telehealth (HOSPITAL_COMMUNITY): Payer: Self-pay | Admitting: *Deleted

## 2020-12-14 MED ORDER — DILTIAZEM HCL ER COATED BEADS 180 MG PO CP24
180.0000 mg | ORAL_CAPSULE | Freq: Two times a day (BID) | ORAL | 3 refills | Status: DC
Start: 2020-12-14 — End: 2020-12-22

## 2020-12-14 NOTE — Telephone Encounter (Signed)
Patient called stating starting  Yesterday evening her HRs were 109-112. She has taken 2 doses of PRN cardizem 30mg .  Discussed with Roderic Palau NP will increase cardizem to 180mg  twice a day for better rate control. Pt in agreement.

## 2020-12-22 ENCOUNTER — Encounter (HOSPITAL_COMMUNITY): Payer: Self-pay | Admitting: Nurse Practitioner

## 2020-12-22 ENCOUNTER — Other Ambulatory Visit: Payer: Self-pay

## 2020-12-22 ENCOUNTER — Ambulatory Visit (HOSPITAL_COMMUNITY)
Admission: RE | Admit: 2020-12-22 | Discharge: 2020-12-22 | Disposition: A | Discharge status: Home / Self Care | Payer: Medicare Other | Source: Ambulatory Visit | Attending: Nurse Practitioner | Admitting: Nurse Practitioner

## 2020-12-22 VITALS — BP 128/78 | HR 93 | Ht 63.0 in | Wt 201.0 lb

## 2020-12-22 DIAGNOSIS — Z7901 Long term (current) use of anticoagulants: Secondary | ICD-10-CM | POA: Insufficient documentation

## 2020-12-22 DIAGNOSIS — I4819 Other persistent atrial fibrillation: Secondary | ICD-10-CM | POA: Diagnosis not present

## 2020-12-22 DIAGNOSIS — Z8249 Family history of ischemic heart disease and other diseases of the circulatory system: Secondary | ICD-10-CM | POA: Diagnosis not present

## 2020-12-22 DIAGNOSIS — D6869 Other thrombophilia: Secondary | ICD-10-CM

## 2020-12-22 DIAGNOSIS — Z79899 Other long term (current) drug therapy: Secondary | ICD-10-CM | POA: Insufficient documentation

## 2020-12-22 DIAGNOSIS — Z87891 Personal history of nicotine dependence: Secondary | ICD-10-CM | POA: Insufficient documentation

## 2020-12-22 DIAGNOSIS — Z888 Allergy status to other drugs, medicaments and biological substances status: Secondary | ICD-10-CM | POA: Diagnosis not present

## 2020-12-22 DIAGNOSIS — I10 Essential (primary) hypertension: Secondary | ICD-10-CM | POA: Diagnosis not present

## 2020-12-22 MED ORDER — DILTIAZEM HCL ER COATED BEADS 180 MG PO CP24: 180.0000 mg | ORAL_CAPSULE | Freq: Two times a day (BID) | ORAL | 3 refills | Status: DC

## 2020-12-22 NOTE — Progress Notes (Signed)
Primary Care Physician: [name], MD Referring Physician: Dr. Melburn Hake Heidi Lopez is a 75 y.o. female with a h/o paroxysmal afib that is in the afib clinic for f/u of Dr. Jackalyn Lombard visit, 10/31/20. She is s/p ablation in 2016 and appeared to be in persistent  afib. He placed a 2 week ZIo patch which confirmed afib burden of 100%. He gave her the option of stopping sotalol and consideration of amiodarone or Tikosyn or repeat  ablation. The pt  would like to defer repeat ablation. SHe feels that her afib is not limiting her lifestyle . She is preferring  rate controlled afib. She  has some mild liver elevations at baseline, which have been worked up, but amiodarone may not be the best option in the case she chose another antiarrythmic. She has seen some fluctuations in her BP at home.   F/u in afib clinic, 12/22/20. She is now off sotalol as she chose rate controlled afib as her afib option going forward. She had a h/a for a few days and noted elevation of heart rates. She called the office and I increased diltiazem to 180 mg bid. This has controlled her heart rates and her BP. I reviewed readings from home and her v rates are in the 70/80's  She now feels improved.   Today, she denies symptoms of palpitations, chest pain, shortness of breath, orthopnea, PND, lower extremity edema, dizziness, presyncope, syncope, or neurologic sequela. The patient is tolerating medications without difficulties and is otherwise without complaint today.   Past Medical History:  Diagnosis Date  . Breast cancer ([place])   . Cancer of breast [facility]) July 2010   s/p XRT 2010 and again in 2011  . Chronic coughing   . Cramps, muscle, general    severe breast cramping   . Glaucoma   . Glaucoma    left eye   . Joint pain    foot and knee pain  . Lymphedema of arm    left  . Night sweats   . Osteopenia 01/24/2015  . Pain    breast pain  . Persistent atrial fibrillation ([place]) 05/2014  . Sinus problem   . Wears  glasses    Past Surgical History:  Procedure Laterality Date  . BREAST SURGERY  2010- right and 2011-left   for cancer  . CARDIOVERSION N/A 10/21/2014   Procedure: CARDIOVERSION;  Surgeon: [name], MD;  Location: [place];  Service: Cardiovascular;  Laterality: N/A;  . COLONOSCOPY N/A 02/05/2013   Procedure: COLONOSCOPY;  Surgeon: [name], MD;  [facility];  Service: Endoscopy;  Laterality: N/A;  830  . COLONOSCOPY N/A 09/24/2019   Procedure: COLONOSCOPY;  Surgeon: [name], MD;  [facility];  Service: Endoscopy;  Laterality: N/A;  100pm  . ELECTROPHYSIOLOGIC STUDY N/A 02/24/2015   PVI and CTI ablation by Dr [name]  . EP IMPLANTABLE DEVICE N/A 08/24/2015   Procedure: Loop Recorder Insertion;  Surgeon: Thompson Grayer, MD;  Location: [place] [facility];  Service: Cardiovascular;  Laterality: N/A;  . HYSTEROSCOPY WITH D & C N/A 12/29/2014   Procedure: DILATATION AND CURETTAGE /HYSTEROSCOPY;  Surgeon: [name], MD;  [facility];  Service: Gynecology;  Laterality: N/A;  . Implantable loop recorder removal  04/29/2019   MDT Reveal LINQ removed by Dr [name] in office  . TEE WITHOUT CARDIOVERSION N/A 02/24/2015   Procedure: TRANSESOPHAGEAL ECHOCARDIOGRAM (TEE);  Surgeon: [name], MD;  [facility];  Service: Cardiovascular;  Laterality: N/A;  . TUBAL LIGATION  1984    Current Outpatient Medications  Medication Sig Dispense Refill  . brimonidine (ALPHAGAN) 0.2 % ophthalmic solution Place 1 drop into the left eye 2 (two) times daily.     . Calcium Carbonate-Vit D-Min (CALTRATE 600+D PLUS PO) Take 1 tablet by mouth daily.    . Cholecalciferol (VITAMIN D PO) Take 2,000 Units by mouth daily.    . Coenzyme Q10 (COQ-10) 100 MG CAPS Take 100 mg by mouth daily.     Marland Kitchen diltiazem (CARDIZEM CD) 180 MG 24 hr capsule Take 1 capsule (180 mg total) by mouth 2 (two) times daily. 90 capsule 3  . ELIQUIS 5 MG TABS tablet Take 1 tablet by mouth  twice daily 180 tablet 1  . LUMIGAN 0.01 % SOLN Place 1 drop into both eyes at bedtime.    Marland Kitchen MAGNESIUM PO Take 400 mg by mouth daily.    . Multiple Vitamin (MULTIVITAMIN) tablet Take 1 tablet by mouth daily.     . Omega-3 Fatty Acids (FISH OIL) 1200 MG CAPS Take 1,200 mg by mouth daily.     . psyllium (METAMUCIL SMOOTH TEXTURE) 58.6 % powder Take 1 packet by mouth daily.    Marland Kitchen albuterol (PROVENTIL HFA;VENTOLIN HFA) 108 (90 BASE) MCG/ACT inhaler Inhale into the lungs every 6 (six) hours as needed for wheezing or shortness of breath. Reported on 11/28/2015    . Cranberry 500 MG CAPS Take 500 mg by mouth daily. (Patient not taking: Reported on 12/12/2020)    . diltiazem (CARDIZEM) 30 MG tablet TAKE ONE TABLET BY MOUTH EVERY 4 HOURS AS NEEDED FOR HEART RATE >100 AS LONG AS BLOOD PRESSURE >100 (Patient not taking: Reported on 12/12/2020) 45 tablet 3  . fluticasone (FLONASE) 50 MCG/ACT nasal spray Place 1 spray into both nostrils daily as needed for rhinitis.     . furosemide (LASIX) 20 MG tablet Take 1 tablet (20 mg total) by mouth daily as needed. Take daily as needed for swelling (Patient not taking: Reported on 12/12/2020) 90 tablet 0  . loratadine (CLARITIN) 10 MG tablet Take 10 mg by mouth daily as needed for allergies. Reported on 09/20/2015 (Patient not taking: Reported on 12/12/2020)    . Misc Natural Products (JOINT SUPPORT COMPLEX PO) Take 40 mg by mouth daily. Per pt she takes an over the counter medication daily for joint pain pt unsure of dosage.  Collagen support UC-II Type II Collagen (Patient not taking: Reported on 12/12/2020)     No current facility-administered medications for this encounter.    Allergies  Allergen Reactions  . Metoprolol     Makes her feel bad  . Vesicare [Solifenacin]     Severe hypertension and tachycardia   . Reglan [Metoclopramide] Other (See Comments)    Causes severe jitters    Social History   Socioeconomic History  . Marital status: Widowed    Spouse  name: Not on file  . Number of children: Not on file  . Years of education: Not on file  . Highest education level: Not on file  Occupational History  . Not on file  Tobacco Use  . Smoking status: Former Smoker    Packs/day: 1.00    Years: 2.00    Pack years: 2.00    Types: Cigarettes    Start date: 09/17/1962    Quit date: 11/16/1966    Years since quitting: 54.1  . Smokeless tobacco: Never Used  Vaping Use  . Vaping Use: Never used  Substance and Sexual Activity  . Alcohol use: No    Alcohol/week: 0.0 standard drinks  . Drug use: No  . Sexual activity: Not Currently    Birth control/protection: Post-menopausal  Other Topics Concern  . Not on file  Social History Narrative   Lives alone in [place]   Retired   Worked previously for [employer] tobacco and [employer]   Social Determinants of Radio broadcast assistant Strain: Not on file  Food Insecurity: Not on file  Transportation Needs: Not on file  Physical Activity: Not on file  Stress: Not on file  Social Connections: Not on file  Intimate Partner Violence: Not on file    Family History  Problem Relation Age of Onset  . Heart disease Mother   . Stroke Father   . Cancer Father   . Colon cancer Father   . Early death Brother   . Cancer Sister   . Cancer Sister   . Cancer Sister   . Diabetes Daughter   . Heart disease Son   . Colon polyps Son   . Breast cancer Neg Hx     ROS- All systems are reviewed and negative except as per the HPI above  Physical Exam: Vitals:   12/22/20 1527  BP: 128/78  Pulse: 93  Weight: 91.2 kg  Height: 5\' 3"  (1.6 m)   Wt Readings from Last 3 Encounters:  12/22/20 91.2 kg  12/12/20 91.6 kg  10/31/20 92.7 kg    Labs: Lab Results  Component Value Date   NA 134 (L) 06/13/2020   K 4.6 06/13/2020   CL 104 06/13/2020   CO2 21 (L) 06/13/2020   GLUCOSE 111 (H) 06/13/2020   BUN 13 06/13/2020   CREATININE 0.72 06/13/2020   CALCIUM 9.0 06/13/2020   MG 2.0 06/13/2020   Lab  Results  Component Value Date   INR 1.21 12/24/2014   No results found for: CHOL, HDL, LDLCALC, TRIG   GEN- The patient is well appearing, alert and oriented x 3 today.   Head- normocephalic, atraumatic Eyes-  Sclera clear, conjunctiva pink Ears- hearing intact Oropharynx- clear Neck- supple, no JVP Lymph- no cervical lymphadenopathy Lungs- Clear to ausculation bilaterally, normal work of breathing Heart- irregular rate and rhythm, no murmurs, rubs or gallops, PMI not laterally displaced GI- soft, NT, ND, + BS Extremities- no clubbing, cyanosis, or edema MS- no significant deformity or atrophy Skin- no rash or lesion Psych- euthymic mood, full affect Neuro- strength and sensation are intact  EKG- afib at 93 bpm, qrs int 86 ms, qtc 465 ms   Zio patch- 2 Ventricular Tachycardia runs occurred, the run with the fastest interval lasting 12 beats with a max rate of 176 bpm (avg 148 bpm); the run with the fastest interval was also the longest. Atrial Fibrillation occurred continuously (100% burden), ranging  from 34-155 bpm (avg of 75 bpm). 133 Pauses occurred, the longest lasting 4.2 secs (14 bpm). Isolated VEs were rare (<1.0%, 1020), VE Couplets were rare (<1.0%, 72), and VE Triplets were rare (<1.0%, 4).           Assessment and Plan: 1. Persistent  afib Pt given options of change in antiarrythmic vrs ablation vrs rate control, opted  to stop sotalol and remain in  rate controlled afib Continue diltiazem 180 mg bid  2. CHA2DS2VASc score of at least 3 Continue  eliquis 5 mg bid   3. HTN Stable in office  Increase in CCB helped with BP readings   F/u in afib clinic in 3 months   [place]. Bubber Rothert, [facility] [address] [place], [place] [address] [phone]

## 2020-12-27 ENCOUNTER — Other Ambulatory Visit (HOSPITAL_COMMUNITY): Payer: Self-pay | Admitting: Internal Medicine

## 2020-12-27 NOTE — Telephone Encounter (Signed)
Age 75, weight 91kg, SCr 0.72 on 06/13/20 Afib indication, last visit afib clinic 12/22/20, [place] 10/31/20

## 2020-12-29 DIAGNOSIS — H401122 Primary open-angle glaucoma, left eye, moderate stage: Secondary | ICD-10-CM | POA: Diagnosis not present

## 2020-12-29 DIAGNOSIS — H401111 Primary open-angle glaucoma, right eye, mild stage: Secondary | ICD-10-CM | POA: Diagnosis not present

## 2021-02-08 ENCOUNTER — Other Ambulatory Visit (HOSPITAL_COMMUNITY): Payer: Self-pay | Admitting: *Deleted

## 2021-02-08 MED ORDER — FUROSEMIDE 20 MG PO TABS: 20.0000 mg | ORAL_TABLET | Freq: Every day | ORAL | 1 refills | Status: DC | PRN

## 2021-03-23 ENCOUNTER — Other Ambulatory Visit: Payer: Self-pay

## 2021-03-23 ENCOUNTER — Ambulatory Visit (HOSPITAL_COMMUNITY)
Admission: RE | Admit: 2021-03-23 | Discharge: 2021-03-23 | Disposition: A | Discharge status: Home / Self Care | Payer: Medicare Other | Source: Ambulatory Visit | Attending: Nurse Practitioner | Admitting: Nurse Practitioner

## 2021-03-23 ENCOUNTER — Encounter (HOSPITAL_COMMUNITY): Payer: Self-pay | Admitting: Nurse Practitioner

## 2021-03-23 VITALS — BP 140/74 | HR 83 | Ht 63.0 in | Wt 205.8 lb

## 2021-03-23 DIAGNOSIS — I4819 Other persistent atrial fibrillation: Secondary | ICD-10-CM | POA: Insufficient documentation

## 2021-03-23 DIAGNOSIS — I10 Essential (primary) hypertension: Secondary | ICD-10-CM | POA: Diagnosis not present

## 2021-03-23 DIAGNOSIS — Z87891 Personal history of nicotine dependence: Secondary | ICD-10-CM | POA: Diagnosis not present

## 2021-03-23 DIAGNOSIS — Z8249 Family history of ischemic heart disease and other diseases of the circulatory system: Secondary | ICD-10-CM | POA: Insufficient documentation

## 2021-03-23 DIAGNOSIS — D6869 Other thrombophilia: Secondary | ICD-10-CM

## 2021-03-23 DIAGNOSIS — Z7901 Long term (current) use of anticoagulants: Secondary | ICD-10-CM | POA: Insufficient documentation

## 2021-03-23 NOTE — Progress Notes (Signed)
Primary Care Physician: [name], MD Referring Physician: Dr. Melburn Hake Heidi Lopez is a 75 y.o. female with a h/o paroxysmal afib that is in the afib clinic for f/u of Dr. Jackalyn Lombard visit, 10/31/20. She is s/p ablation in 2016 and appeared to be in persistent  afib. He placed a 2 week ZIo patch which confirmed afib burden of 100%. He gave her the option of stopping sotalol and consideration of amiodarone or Tikosyn or repeat  ablation. The pt  would like to defer repeat ablation. SHe feels that her afib is not limiting her lifestyle . She is preferring  rate controlled afib. She  has some mild liver elevations at baseline, which have been worked up, but amiodarone may not be the best option in the case she chose another antiarrythmic. She has seen some fluctuations in her BP at home.   F/u in afib clinic, 12/22/20. She is now off sotalol as she chose rate controlled afib as her afib option going forward. She had a h/a for a few days and noted elevation of heart rates. She called the office and I increased diltiazem to 180 mg bid. This has controlled her heart rates and her BP. I reviewed readings from home and her v rates are in the 70/80's  She now feels improved.   F/u in afib clinic, 03/23/21. She remains in afib, rate controlled. She feels well.   Today, she denies symptoms of palpitations, chest pain, shortness of breath, orthopnea, PND, lower extremity edema, dizziness, presyncope, syncope, or neurologic sequela. The patient is tolerating medications without difficulties and is otherwise without complaint today.   Past Medical History:  Diagnosis Date   Breast cancer ([place])    Cancer of breast ([place]) July 2010   s/p XRT 2010 and again in 2011   Chronic coughing    Cramps, muscle, general    severe breast cramping    Glaucoma    Glaucoma    left eye    Joint pain    foot and knee pain   Lymphedema of arm    left   Night sweats    Osteopenia 01/24/2015   Pain    breast pain    Persistent atrial fibrillation ([place]) 05/2014   Sinus problem    Wears glasses    Past Surgical History:  Procedure Laterality Date   BREAST SURGERY  2010- right and 2011-left   for cancer   CARDIOVERSION N/A 10/21/2014   Procedure: CARDIOVERSION;  Surgeon: [name], MD;  Location: [place];  Service: Cardiovascular;  Laterality: N/A;   COLONOSCOPY N/A 02/05/2013   Procedure: COLONOSCOPY;  Surgeon: [name], MD;  [facility];  Service: Endoscopy;  Laterality: N/A;  830   COLONOSCOPY N/A 09/24/2019   Procedure: COLONOSCOPY;  Surgeon: [name], MD;  [facility];  Service: Endoscopy;  Laterality: N/A;  100pm   ELECTROPHYSIOLOGIC STUDY N/A 02/24/2015   PVI and CTI ablation by Dr [name]   EP IMPLANTABLE DEVICE N/A 08/24/2015   Procedure: Loop Recorder Insertion;  Surgeon: Thompson Grayer, MD;  Location: [place] [facility];  Service: Cardiovascular;  Laterality: N/A;   HYSTEROSCOPY WITH D & C N/A 12/29/2014   Procedure: DILATATION AND CURETTAGE /HYSTEROSCOPY;  Surgeon: [name], MD;  [facility];  Service: Gynecology;  Laterality: N/A;   Implantable loop recorder removal  04/29/2019   MDT Reveal LINQ removed by Dr [name] in office   TEE WITHOUT  CARDIOVERSION N/A 02/24/2015   Procedure: TRANSESOPHAGEAL ECHOCARDIOGRAM (TEE);  Surgeon: [name], MD;  Location: [place];  Service: Cardiovascular;  Laterality: N/A;   TUBAL LIGATION  1984    Current Outpatient Medications  Medication Sig Dispense Refill   albuterol (PROVENTIL HFA;VENTOLIN HFA) 108 (90 BASE) MCG/ACT inhaler Inhale into the lungs every 6 (six) hours as needed for wheezing or shortness of breath. Reported on 11/28/2015     brimonidine (ALPHAGAN) 0.2 % ophthalmic solution Place 1 drop into the left eye 2 (two) times daily.      Calcium Carbonate-Vit D-Min (CALTRATE 600+D PLUS PO) Take 1 tablet by mouth daily.     Cholecalciferol (VITAMIN D PO) Take 2,000 Units by mouth daily.      Coenzyme Q10 (COQ-10) 100 MG CAPS Take 100 mg by mouth daily.      Cranberry 500 MG CAPS Take 500 mg by mouth daily.     diltiazem (CARDIZEM CD) 180 MG 24 hr capsule Take 1 capsule (180 mg total) by mouth 2 (two) times daily. 180 capsule 3   diltiazem (CARDIZEM) 30 MG tablet TAKE ONE TABLET BY MOUTH EVERY 4 HOURS AS NEEDED FOR HEART RATE >100 AS LONG AS BLOOD PRESSURE >100 45 tablet 3   ELIQUIS 5 MG TABS tablet Take 1 tablet by mouth twice daily 180 tablet 1   fluticasone (FLONASE) 50 MCG/ACT nasal spray Place 1 spray into both nostrils daily as needed for rhinitis.      furosemide (LASIX) 20 MG tablet Take 1 tablet (20 mg total) by mouth daily as needed. Take daily as needed for swelling 30 tablet 1   loratadine (CLARITIN) 10 MG tablet Take 10 mg by mouth daily as needed for allergies. Reported on 09/20/2015     LUMIGAN 0.01 % SOLN Place 1 drop into both eyes at bedtime.     MAGNESIUM PO Take 400 mg by mouth daily.     METAMUCIL FIBER PO Take by mouth. Taking one capsule by mouth daily     Multiple Vitamin (MULTIVITAMIN) tablet Take 1 tablet by mouth daily.      Omega-3 Fatty Acids (FISH OIL) 1200 MG CAPS Take 1,200 mg by mouth daily.      No current facility-administered medications for this encounter.    Allergies  Allergen Reactions   Metoprolol     Makes her feel bad   Vesicare [Solifenacin]     Severe hypertension and tachycardia    Reglan [Metoclopramide] Other (See Comments)    Causes severe jitters    Social History   Socioeconomic History   Marital status: Widowed    Spouse name: Not on file   Number of children: Not on file   Years of education: Not on file   Highest education level: Not on file  Occupational History   Not on file  Tobacco Use   Smoking status: Former    Packs/day: 1.00    Years: 2.00    Pack years: 2.00    Types: Cigarettes    Start date: 09/17/1962    Quit date: 11/16/1966    Years since quitting: 54.3   Smokeless tobacco: Never  Vaping Use    Vaping Use: Never used  Substance and Sexual Activity   Alcohol use: No    Alcohol/week: 0.0 standard drinks   Drug use: No   Sexual activity: Not Currently    Birth control/protection: Post-menopausal  Other Topics Concern   Not on file  Social History Narrative   Lives  alone in [place]   Retired   Worked previously for [employer] and [employer]   Social Determinants of Radio broadcast assistant Strain: Not on file  Food Insecurity: Not on file  Transportation Needs: Not on file  Physical Activity: Not on file  Stress: Not on file  Social Connections: Not on file  Intimate Partner Violence: Not on file    Family History  Problem Relation Age of Onset   Heart disease Mother    Stroke Father    Cancer Father    Colon cancer Father    Early death Brother    Cancer Sister    Cancer Sister    Cancer Sister    Diabetes Daughter    Heart disease Son    Colon polyps Son    Breast cancer Neg Hx     ROS- All systems are reviewed and negative except as per the HPI above  Physical Exam: Vitals:   03/23/21 1417  Pulse: 83  Weight: 93.4 kg  Height: 5\' 3"  (1.6 m)   Wt Readings from Last 3 Encounters:  03/23/21 93.4 kg  12/22/20 91.2 kg  12/12/20 91.6 kg    Labs: Lab Results  Component Value Date   NA 134 (L) 06/13/2020   K 4.6 06/13/2020   CL 104 06/13/2020   CO2 21 (L) 06/13/2020   GLUCOSE 111 (H) 06/13/2020   BUN 13 06/13/2020   CREATININE 0.72 06/13/2020   CALCIUM 9.0 06/13/2020   MG 2.0 06/13/2020   Lab Results  Component Value Date   INR 1.21 12/24/2014   No results found for: CHOL, HDL, LDLCALC, TRIG   GEN- The patient is well appearing, alert and oriented x 3 today.   Head- normocephalic, atraumatic Eyes-  Sclera clear, conjunctiva pink Ears- hearing intact Oropharynx- clear Neck- supple, no JVP Lymph- no cervical lymphadenopathy Lungs- Clear to ausculation bilaterally, normal work of breathing Heart- irregular rate and rhythm, no  murmurs, rubs or gallops, PMI not laterally displaced GI- soft, NT, ND, + BS Extremities- no clubbing, cyanosis, or edema MS- no significant deformity or atrophy Skin- no rash or lesion Psych- euthymic mood, full affect Neuro- strength and sensation are intact  EKG- afib at 83 bpm   Zio patch- 2 Ventricular Tachycardia runs occurred, the run with the fastest interval lasting 12 beats with a max rate of 176 bpm (avg 148 bpm); the run with the fastest interval was also the longest. Atrial Fibrillation occurred continuously (100% burden), ranging  from 34-155 bpm (avg of 75 bpm). 133 Pauses occurred, the longest lasting 4.2 secs (14 bpm). Isolated VEs were rare (<1.0%, 1020), VE Couplets were rare (<1.0%, 72), and VE Triplets were rare (<1.0%, 4).           Assessment and Plan: 1. Persistent  afib Pt given options  on last visit of change in antiarrythmic vrs ablation vrs rate control, opted  to stop sotalol and remain in  rate controlled afib Continues to be rate controlled, feels well  Continue diltiazem 180 mg bid  2. CHA2DS2VASc score of at least 3 Continue  eliquis 5 mg bid   3. HTN Stable in office   F/u in afib clinic in 6 months    [place]. Bayyinah Dukeman, [facility] [address] [place], [place] [address] [phone]

## 2021-05-18 ENCOUNTER — Other Ambulatory Visit: Payer: Self-pay | Admitting: Hematology

## 2021-05-18 DIAGNOSIS — Z1231 Encounter for screening mammogram for malignant neoplasm of breast: Secondary | ICD-10-CM

## 2021-06-21 ENCOUNTER — Other Ambulatory Visit (HOSPITAL_COMMUNITY): Payer: Self-pay | Admitting: Internal Medicine

## 2021-06-21 NOTE — Telephone Encounter (Signed)
Prescription refill request for Eliquis received. Indication: Afib  Last office visit: 03/23/21 Kayleen Memos)  Scr: 0.72 (06/13/20) Age: 75 Weight: 93.4kg  Pt overdue for labs; however, pt has labs ordered with upcoming appt on 07/06/21. Will refill Eliquis to last until after lab work is resulted. Appropriate dose and refill sent to requested pharmacy.

## 2021-06-22 ENCOUNTER — Ambulatory Visit
Admission: RE | Admit: 2021-06-22 | Discharge: 2021-06-22 | Disposition: A | Discharge status: Home / Self Care | Payer: Medicare Other | Source: Ambulatory Visit | Attending: Hematology | Admitting: Hematology

## 2021-06-22 ENCOUNTER — Other Ambulatory Visit: Payer: Self-pay

## 2021-06-22 DIAGNOSIS — Z1231 Encounter for screening mammogram for malignant neoplasm of breast: Secondary | ICD-10-CM

## 2021-06-23 ENCOUNTER — Other Ambulatory Visit: Payer: Self-pay | Admitting: Hematology

## 2021-06-23 DIAGNOSIS — N6310 Unspecified lump in the right breast, unspecified quadrant: Secondary | ICD-10-CM

## 2021-07-05 DIAGNOSIS — H401111 Primary open-angle glaucoma, right eye, mild stage: Secondary | ICD-10-CM | POA: Diagnosis not present

## 2021-07-06 ENCOUNTER — Other Ambulatory Visit: Payer: Self-pay

## 2021-07-06 ENCOUNTER — Inpatient Hospital Stay (HOSPITAL_COMMUNITY): Payer: Medicare Other | Attending: Hematology

## 2021-07-06 DIAGNOSIS — Z79811 Long term (current) use of aromatase inhibitors: Secondary | ICD-10-CM | POA: Insufficient documentation

## 2021-07-06 DIAGNOSIS — Z853 Personal history of malignant neoplasm of breast: Secondary | ICD-10-CM

## 2021-07-06 DIAGNOSIS — C50912 Malignant neoplasm of unspecified site of left female breast: Secondary | ICD-10-CM | POA: Insufficient documentation

## 2021-07-06 DIAGNOSIS — C50911 Malignant neoplasm of unspecified site of right female breast: Secondary | ICD-10-CM | POA: Insufficient documentation

## 2021-07-06 DIAGNOSIS — Z170 Estrogen receptor positive status [ER+]: Secondary | ICD-10-CM | POA: Diagnosis not present

## 2021-07-06 LAB — COMPREHENSIVE METABOLIC PANEL
ALT: 26 U/L (ref 0–44)
AST: 25 U/L (ref 15–41)
Albumin: 4.4 g/dL (ref 3.5–5.0)
Alkaline Phosphatase: 83 U/L (ref 38–126)
Anion gap: 9 (ref 5–15)
BUN: 10 mg/dL (ref 8–23)
CO2: 21 mmol/L — ABNORMAL LOW (ref 22–32)
Calcium: 8.9 mg/dL (ref 8.9–10.3)
Chloride: 103 mmol/L (ref 98–111)
Creatinine, Ser: 0.7 mg/dL (ref 0.44–1.00)
GFR, Estimated: >60 mL/min (ref >60)
Glucose, Bld: 111 mg/dL — ABNORMAL HIGH (ref 70–99)
Potassium: 4.2 mmol/L (ref 3.5–5.1)
Sodium: 133 mmol/L — ABNORMAL LOW (ref 135–145)
Total Bilirubin: 0.7 mg/dL (ref 0.3–1.2)
Total Protein: 7.4 g/dL (ref 6.5–8.1)

## 2021-07-06 LAB — CBC WITH DIFFERENTIAL/PLATELET
Abs Immature Granulocytes: 0.04 10*3/uL (ref 0.00–0.07)
Basophils Absolute: 0.1 10*3/uL (ref 0.0–0.1)
Basophils Relative: 1 %
Eosinophils Absolute: 0.2 10*3/uL (ref 0.0–0.5)
Eosinophils Relative: 2 %
HCT: 43.8 % (ref 36.0–46.0)
Hemoglobin: 14.6 g/dL (ref 12.0–15.0)
Immature Granulocytes: 0 %
Lymphocytes Relative: 28 %
Lymphs Abs: 2.6 10*3/uL (ref 0.7–4.0)
MCH: 30.6 pg (ref 26.0–34.0)
MCHC: 33.3 g/dL (ref 30.0–36.0)
MCV: 91.8 fL (ref 80.0–100.0)
Monocytes Absolute: 0.9 10*3/uL (ref 0.1–1.0)
Monocytes Relative: 10 %
Neutro Abs: 5.4 10*3/uL (ref 1.7–7.7)
Neutrophils Relative %: 59 %
Platelets: 219 10*3/uL (ref 150–400)
RBC: 4.77 MIL/uL (ref 3.87–5.11)
RDW: 12.5 % (ref 11.5–15.5)
WBC: 9.3 10*3/uL (ref 4.0–10.5)
nRBC: 0 % (ref 0.0–0.2)

## 2021-07-06 LAB — VITAMIN D 25 HYDROXY (VIT D DEFICIENCY, FRACTURES): Vit D, 25-Hydroxy: 39.32 ng/mL (ref 30–100)

## 2021-07-06 LAB — MAGNESIUM: Magnesium: 2.2 mg/dL (ref 1.7–2.4)

## 2021-07-13 ENCOUNTER — Ambulatory Visit (HOSPITAL_COMMUNITY): Payer: Medicare Other | Admitting: Hematology

## 2021-07-13 ENCOUNTER — Ambulatory Visit
Admission: RE | Admit: 2021-07-13 | Discharge: 2021-07-13 | Disposition: A | Discharge status: Home / Self Care | Payer: Medicare Other | Source: Ambulatory Visit | Attending: Hematology | Admitting: Hematology

## 2021-07-13 ENCOUNTER — Other Ambulatory Visit: Payer: Self-pay

## 2021-07-13 DIAGNOSIS — R922 Inconclusive mammogram: Secondary | ICD-10-CM | POA: Diagnosis not present

## 2021-07-13 DIAGNOSIS — N6310 Unspecified lump in the right breast, unspecified quadrant: Secondary | ICD-10-CM

## 2021-07-13 DIAGNOSIS — Z853 Personal history of malignant neoplasm of breast: Secondary | ICD-10-CM | POA: Diagnosis not present

## 2021-07-13 HISTORY — DX: Personal history of irradiation: Z92.3

## 2021-07-14 DIAGNOSIS — Z23 Encounter for immunization: Secondary | ICD-10-CM | POA: Diagnosis not present

## 2021-07-19 NOTE — Progress Notes (Signed)
[facility] [address] [place] [address]   Patient Care Team: Carylon Perches, MD as PCP - General (Internal Medicine) Hillis Range, MD as PCP - Electrophysiology (Cardiology) [name], MD as Consulting Physician (Radiation Oncology) Glenford Peers, MD as Consulting Physician (Hematology and Oncology) Malissa Hippo, MD as Consulting Physician (Gastroenterology)  SUMMARY OF ONCOLOGIC HISTORY: Oncology History   No history exists.    CHIEF COMPLIANT: Follow-up for bilateral breast cancer   INTERVAL HISTORY: [name] is a 75 y.o. female here today for follow up of her bilateral breast cancer. Her last visit was on 07/13/2020.   Today she reports feeling good. She palpated a knot in her right breast 1 month ago, and mammogram and [place] on 07/14/2021 showed no evidence of malignancy.   REVIEW OF SYSTEMS:   Review of Systems  Constitutional:  Negative for appetite change and fatigue (75%).  All other systems reviewed and are negative.  I have reviewed the past medical history, past surgical history, social history and family history with the patient and they are unchanged from previous note.   ALLERGIES:   is allergic to metoprolol, vesicare [solifenacin], and reglan [metoclopramide].   MEDICATIONS:  Current Outpatient Medications  Medication Sig Dispense Refill   albuterol (PROVENTIL HFA;VENTOLIN HFA) 108 (90 BASE) MCG/ACT inhaler Inhale into the lungs every 6 (six) hours as needed for wheezing or shortness of breath. Reported on 11/28/2015     brimonidine (ALPHAGAN) 0.2 % ophthalmic solution Place 1 drop into the left eye 2 (two) times daily.      Calcium Carbonate-Vit D-Min (CALTRATE 600+D PLUS PO) Take 1 tablet by mouth daily.     Cholecalciferol (VITAMIN D PO) Take 2,000 Units by mouth daily.     Coenzyme Q10 (COQ-10) 100 MG CAPS Take 100 mg by mouth daily.      Cranberry 500 MG CAPS Take 500 mg by mouth daily.     diltiazem (CARDIZEM  CD) 180 MG 24 hr capsule Take 1 capsule (180 mg total) by mouth 2 (two) times daily. 180 capsule 3   diltiazem (CARDIZEM) 30 MG tablet TAKE ONE TABLET BY MOUTH EVERY 4 HOURS AS NEEDED FOR HEART RATE >100 AS LONG AS BLOOD PRESSURE >100 45 tablet 3   ELIQUIS 5 MG TABS tablet Take 1 tablet by mouth twice daily 180 tablet 0   fluticasone (FLONASE) 50 MCG/ACT nasal spray Place 1 spray into both nostrils daily as needed for rhinitis.      furosemide (LASIX) 20 MG tablet Take 1 tablet (20 mg total) by mouth daily as needed. Take daily as needed for swelling 30 tablet 1   loratadine (CLARITIN) 10 MG tablet Take 10 mg by mouth daily as needed for allergies. Reported on 09/20/2015     LUMIGAN 0.01 % SOLN Place 1 drop into both eyes at bedtime.     MAGNESIUM PO Take 400 mg by mouth daily.     METAMUCIL FIBER PO Take by mouth. Taking one capsule by mouth daily     Multiple Vitamin (MULTIVITAMIN) tablet Take 1 tablet by mouth daily.      Omega-3 Fatty Acids (FISH OIL) 1200 MG CAPS Take 1,200 mg by mouth daily.      No current facility-administered medications for this visit.     PHYSICAL EXAMINATION: Performance status (ECOG): 1 - Symptomatic but completely ambulatory  There were no vitals filed for this visit. Wt Readings from Last 3 Encounters:  03/23/21 205 lb  12.8 oz (93.4 kg)  12/22/20 201 lb (91.2 kg)  12/12/20 202 lb (91.6 kg)   Physical Exam Vitals reviewed.  Constitutional:      Appearance: Normal appearance.  Cardiovascular:     Rate and Rhythm: Normal rate and regular rhythm.     Pulses: Normal pulses.     Heart sounds: Normal heart sounds.  Pulmonary:     Effort: Pulmonary effort is normal.     Breath sounds: Normal breath sounds.  Chest:  Breasts:    Right: Skin change (thickening at innner third of lumpetomy scar) present.     Left: No skin change (UOQ lumpectomy scar is stable).    Abdominal:     Palpations: Abdomen is soft. There is no hepatomegaly, splenomegaly or mass.      Tenderness: There is no abdominal tenderness.  Neurological:     General: No focal deficit present.     Mental Status: She is alert and oriented to person, place, and time.  Psychiatric:        Mood and Affect: Mood normal.        Behavior: Behavior normal.    Breast Exam Chaperone: Thana Ates     LABORATORY DATA:  I have reviewed the data as listed CMP Latest Ref Rng & Units 07/06/2021 06/13/2020 03/22/2020  Glucose 70 - 99 mg/dL 111(H) 111(H) 90  BUN 8 - 23 mg/dL $Remove'10 13 11  'UnvMzcV$ Creatinine 0.44 - 1.00 mg/dL 0.70 0.72 0.75  Sodium 135 - 145 mmol/L 133(L) 134(L) 139  Potassium 3.5 - 5.1 mmol/L 4.2 4.6 4.5  Chloride 98 - 111 mmol/L 103 104 102  CO2 22 - 32 mmol/L 21(L) 21(L) 20  Calcium 8.9 - 10.3 mg/dL 8.9 9.0 9.6  Total Protein 6.5 - 8.1 g/dL 7.4 6.6 -  Total Bilirubin 0.3 - 1.2 mg/dL 0.7 0.7 -  Alkaline Phos 38 - 126 U/L 83 72 -  AST 15 - 41 U/L 25 43(H) -  ALT 0 - 44 U/L 26 59(H) -   No results found for: LOV564 Lab Results  Component Value Date   WBC 9.3 07/06/2021   HGB 14.6 07/06/2021   HCT 43.8 07/06/2021   MCV 91.8 07/06/2021   PLT 219 07/06/2021   NEUTROABS 5.4 07/06/2021    ASSESSMENT:  1.  Right breast infiltrating lobular carcinoma: - Status post right lumpectomy and lymph node biopsy on 04/14/2009, pT1b, PN 0, ER/PR positive, Ki-67 8%, margins negative, grade 1, 0.6 cm. -Patient was recommended tamoxifen which she took for approximately 4 years.  This was discontinued secondary to endometrial hyperplasia. - Breast cancer index testing shows high likelihood of benefit. -She was subsequently switched to anastrozole and could not tolerate secondary to musculoskeletal symptoms. -Mammogram on 06/08/2020 was BI-RADS Category 1. -CT abdomen on 07/12/2020 did not show any evidence of fatty liver or meta stasis. - Femara completed in October 2021.   2.  Left breast IDC: -She had a left breast lumpectomy and lymph node biopsy on 04/26/2010.  Pathology showed 1.7 cm,  grade 1, with low-grade DCIS, margins negative.  PT1CPN0.  Ki-67 was 9%.  ER/PR positive and HER-2 negative. -Bilateral lumpectomy sites within normal limits.  Bilateral breast has no palpable masses.   3.  Osteopenia: - DEXA scan on 11/27/2017 shows T score of -1.9.  This has improved from a T score of -2.3 on 11/22/2016. -She was offered Prolia but she refused it given the side effects.   PLAN:  1.  Right breast ILC  and left breast IDC: - She has completed 10 years of antiestrogen therapy in 2021. - Physical examination today shows left upper outer quadrant lumpectomy scar is within normal limits. - Right breast lumpectomy scar above the areola has slight thickening which is stable at the medial third.  She also noticed slight skin thickening above the areola. - She did mammogram on 07/13/2021 which showed surgical scarring and dystrophic calcifications in the upper outer right breast at the site of patient concern.  She also underwent ultrasound.  She will have another screening mammogram in 1 year. - Reviewed labs from 07/06/2021 which showed normal chemistries, LFTs and CBC.   2.  Osteopenia: - Continue vitamin D supplements. - Vitamin D is normal at 39.  Breast Cancer therapy associated bone loss: I have recommended calcium, Vitamin D and weight bearing exercises.  Orders placed this encounter:  No orders of the defined types were placed in this encounter.   The patient has a good understanding of the overall plan. She agrees with it. She will call with any problems that may develop before the next visit here.  Derek Jack, MD [place] [phone]   I, Thana Ates, am acting as a scribe for Dr. Derek Jack.  I, Derek Jack MD, have reviewed the above documentation for accuracy and completeness, and I agree with the above.

## 2021-07-20 ENCOUNTER — Inpatient Hospital Stay (HOSPITAL_COMMUNITY): Payer: Medicare Other | Attending: Hematology | Admitting: Hematology

## 2021-07-20 ENCOUNTER — Other Ambulatory Visit: Payer: Self-pay

## 2021-07-20 ENCOUNTER — Inpatient Hospital Stay (HOSPITAL_COMMUNITY): Payer: Medicare Other

## 2021-07-20 VITALS — BP 116/55 | HR 73 | Temp 98.0°F | Resp 18 | Wt 203.7 lb

## 2021-07-20 DIAGNOSIS — Z853 Personal history of malignant neoplasm of breast: Secondary | ICD-10-CM | POA: Diagnosis not present

## 2021-07-20 DIAGNOSIS — M8580 Other specified disorders of bone density and structure, unspecified site: Secondary | ICD-10-CM | POA: Diagnosis not present

## 2021-07-20 DIAGNOSIS — E559 Vitamin D deficiency, unspecified: Secondary | ICD-10-CM | POA: Diagnosis not present

## 2021-07-20 DIAGNOSIS — Z23 Encounter for immunization: Secondary | ICD-10-CM

## 2021-07-20 DIAGNOSIS — Z1231 Encounter for screening mammogram for malignant neoplasm of breast: Secondary | ICD-10-CM | POA: Diagnosis not present

## 2021-07-20 MED ORDER — INFLUENZA VAC A&B SA ADJ QUAD 0.5 ML IM PRSY
0.5000 mL | PREFILLED_SYRINGE | Freq: Once | INTRAMUSCULAR | Status: AC
Start: 2021-07-20 — End: 2021-07-20
  Administered 2021-07-20: 0.5 mL via INTRAMUSCULAR
  Filled 2021-07-20: qty 0.5

## 2021-07-20 NOTE — Progress Notes (Signed)
Mrs. Grams in today for office visit. Flue vaccine ordered. See MAR for administration information. Patient remained stable throughout injection. Patient discharged from clinic ambulatory and in stable condition.

## 2021-07-20 NOTE — Patient Instructions (Signed)
[place] at [facility] Discharge Instructions  You were seen and examined today by Dr. Delton Coombes. He reviewed your most recent labs and everything looks good except your sodium levels is slightly low. This may be coming from some of your medications or where you have cut back on salt intake. Please keep follow up as scheduled.   Thank you for choosing [place] at [facility] to provide your oncology and hematology care.  To afford each patient quality time with our provider, please arrive at least 15 minutes before your scheduled appointment time.   If you have a lab appointment with the [facility] please come in thru the Main Entrance and check in at the main information desk.  You need to re-schedule your appointment should you arrive 10 or more minutes late.  We strive to give you quality time with our providers, and arriving late affects you and other patients whose appointments are after yours.  Also, if you no show three or more times for appointments you may be dismissed from the clinic at the providers discretion.     Again, thank you for choosing [facility].  Our hope is that these requests will decrease the amount of time that you wait before being seen by our physicians.       _____________________________________________________________  Should you have questions after your visit to [facility], please contact our office at [phone] and follow the prompts.  Our office hours are 8:00 a.m. and 4:30 p.m. Monday - Friday.  Please note that voicemails left after 4:00 p.m. may not be returned until the following business day.  We are closed weekends and major holidays.  You do have access to a nurse 24-7, just call the main number to the clinic [phone] and do not press any options, hold on the line and a nurse will answer the phone.    For prescription refill requests, have your pharmacy contact our  office and allow 72 hours.    Due to Covid, you will need to wear a mask upon entering the hospital. If you do not have a mask, a mask will be given to you at the Main Entrance upon arrival. For doctor visits, patients may have 1 support person age 34 or older with them. For treatment visits, patients can not have anyone with them due to social distancing guidelines and our immunocompromised population.

## 2021-07-20 NOTE — Patient Instructions (Signed)
You received your flu shot today.

## 2021-07-21 DIAGNOSIS — H401111 Primary open-angle glaucoma, right eye, mild stage: Secondary | ICD-10-CM | POA: Diagnosis not present

## 2021-09-15 DIAGNOSIS — C50919 Malignant neoplasm of unspecified site of unspecified female breast: Secondary | ICD-10-CM | POA: Diagnosis not present

## 2021-09-25 ENCOUNTER — Other Ambulatory Visit (HOSPITAL_COMMUNITY): Payer: Self-pay | Admitting: Internal Medicine

## 2021-09-25 NOTE — Telephone Encounter (Signed)
Pt last saw Roderic Palau, NP on 03/23/21, last 07/06/21 Creat  0.70, age 76, weight 92.4kg, based on specified criteria pt is on appropriate dosage of Eliquis 5mg  BID for afib.  Will refill rx.

## 2021-10-18 ENCOUNTER — Other Ambulatory Visit: Payer: Self-pay

## 2021-10-18 ENCOUNTER — Ambulatory Visit (HOSPITAL_COMMUNITY)
Admission: RE | Admit: 2021-10-18 | Discharge: 2021-10-18 | Disposition: A | Discharge status: Home / Self Care | Payer: Medicare Other | Source: Ambulatory Visit | Attending: Nurse Practitioner | Admitting: Nurse Practitioner

## 2021-10-18 ENCOUNTER — Encounter (HOSPITAL_COMMUNITY): Payer: Self-pay | Admitting: Nurse Practitioner

## 2021-10-18 VITALS — BP 126/78 | HR 81 | Ht 63.0 in | Wt 207.0 lb

## 2021-10-18 DIAGNOSIS — Z7901 Long term (current) use of anticoagulants: Secondary | ICD-10-CM | POA: Insufficient documentation

## 2021-10-18 DIAGNOSIS — D6869 Other thrombophilia: Secondary | ICD-10-CM | POA: Diagnosis not present

## 2021-10-18 DIAGNOSIS — I10 Essential (primary) hypertension: Secondary | ICD-10-CM | POA: Insufficient documentation

## 2021-10-18 DIAGNOSIS — Z79899 Other long term (current) drug therapy: Secondary | ICD-10-CM | POA: Insufficient documentation

## 2021-10-18 DIAGNOSIS — I4819 Other persistent atrial fibrillation: Secondary | ICD-10-CM | POA: Diagnosis not present

## 2021-10-18 NOTE — Progress Notes (Signed)
Primary Care Physician: [name], MD Referring Physician: Dr. Melburn Hake Heidi Lopez is a 76 y.o. female with a h/o paroxysmal afib that is in the afib clinic for f/u of Dr. Jackalyn Lombard visit, 10/31/20. She is s/p ablation in 2016 and appeared to be in persistent  afib. He placed a 2 week ZIo patch which confirmed afib burden of 100%. He gave her the option of stopping sotalol and consideration of amiodarone or Tikosyn or repeat  ablation. The pt  would like to defer repeat ablation. SHe feels that her afib is not limiting her lifestyle . She is preferring  rate controlled afib. She  has some mild liver elevations at baseline, which have been worked up, but amiodarone may not be the best option in the case she chose another antiarrythmic. She has seen some fluctuations in her BP at home.   F/u in afib clinic, 12/22/20. She is now off sotalol as she chose rate controlled afib as her afib option going forward. She had a h/a for a few days and noted elevation of heart rates. She called the office and I increased diltiazem to 180 mg bid. This has controlled her heart rates and her BP. I reviewed readings from home and her v rates are in the 70/80's  She now feels improved.   F/u in afib clinic, 03/23/21. She remains in afib, rate controlled. She feels well.   F/u in afib clinic, 10/18/21. She remains in rate controlled afib. She is not symptomatic. Minimal pedal edema. No issues with anticoagulation.   Today, she denies symptoms of palpitations, chest pain, shortness of breath, orthopnea, PND, lower extremity edema, dizziness, presyncope, syncope, or neurologic sequela. The patient is tolerating medications without difficulties and is otherwise without complaint today.   Past Medical History:  Diagnosis Date   Breast cancer ([place])    Cancer of breast ([place]) 03/2009   s/p XRT 2010 and again in 2011   Chronic coughing    Cramps, muscle, general    severe breast cramping    Glaucoma    Glaucoma    left  eye    Joint pain    foot and knee pain   Lymphedema of arm    left   Night sweats    Osteopenia 01/24/2015   Pain    breast pain   Persistent atrial fibrillation ([place]) 05/2014   Personal history of radiation therapy    right and left breast   Sinus problem    Wears glasses    Past Surgical History:  Procedure Laterality Date   BREAST LUMPECTOMY Right 2010   BREAST LUMPECTOMY Left 2011   BREAST SURGERY  2010- right and 2011-left   for cancer   CARDIOVERSION N/A 10/21/2014   Procedure: CARDIOVERSION;  Surgeon: [name], MD;  Location: [facility];  Service: Cardiovascular;  Laterality: N/A;   COLONOSCOPY N/A 02/05/2013   Procedure: COLONOSCOPY;  Surgeon: [name], MD;  [facility];  Service: Endoscopy;  Laterality: N/A;  830   COLONOSCOPY N/A 09/24/2019   Procedure: COLONOSCOPY;  Surgeon: [name], MD;  [facility];  Service: Endoscopy;  Laterality: N/A;  100pm   ELECTROPHYSIOLOGIC STUDY N/A 02/24/2015   PVI and CTI ablation by Dr [name]   EP IMPLANTABLE DEVICE N/A 08/24/2015   Procedure: Loop Recorder Insertion;  Surgeon: Thompson Grayer, MD;  Location: [facility];  Service: Cardiovascular;  Laterality: N/A;   HYSTEROSCOPY WITH D &  C N/A 12/29/2014   Procedure: DILATATION AND CURETTAGE /HYSTEROSCOPY;  Surgeon: [name], MD;  Location: [facility];  Service: Gynecology;  Laterality: N/A;   Implantable loop recorder removal  04/29/2019   MDT Reveal LINQ removed by Dr [name] in office   TEE WITHOUT CARDIOVERSION N/A 02/24/2015   Procedure: TRANSESOPHAGEAL ECHOCARDIOGRAM (TEE);  Surgeon: [name], MD;  Location: [place];  Service: Cardiovascular;  Laterality: N/A;   TUBAL LIGATION  1984    Current Outpatient Medications  Medication Sig Dispense Refill   albuterol (PROVENTIL HFA;VENTOLIN HFA) 108 (90 BASE) MCG/ACT inhaler Inhale into the lungs every 6 (six) hours as needed for wheezing or shortness of breath. Reported  on 11/28/2015     amoxicillin (AMOXIL) 500 MG capsule Takes for her dental procedures.     apixaban (ELIQUIS) 5 MG TABS tablet Take 1 tablet by mouth twice daily 180 tablet 1   brimonidine (ALPHAGAN) 0.2 % ophthalmic solution Place 1 drop into the left eye 2 (two) times daily.      Calcium Carbonate-Vit D-Min (CALTRATE 600+D PLUS PO) Take 1 tablet by mouth daily.     Cholecalciferol (VITAMIN D PO) Take 2,000 Units by mouth daily.     Coenzyme Q10 (COQ-10) 100 MG CAPS Take 100 mg by mouth daily.      Cranberry 500 MG CAPS Take 500 mg by mouth daily.     diltiazem (CARDIZEM CD) 180 MG 24 hr capsule Take 1 capsule (180 mg total) by mouth 2 (two) times daily. 180 capsule 3   diltiazem (CARDIZEM) 30 MG tablet TAKE ONE TABLET BY MOUTH EVERY 4 HOURS AS NEEDED FOR HEART RATE >100 AS LONG AS BLOOD PRESSURE >100 45 tablet 3   fluticasone (FLONASE) 50 MCG/ACT nasal spray Place 1 spray into both nostrils daily as needed for rhinitis.      furosemide (LASIX) 20 MG tablet Take 1 tablet (20 mg total) by mouth daily as needed. Take daily as needed for swelling 30 tablet 1   loratadine (CLARITIN) 10 MG tablet Take 10 mg by mouth daily as needed for allergies. Reported on 09/20/2015     LUMIGAN 0.01 % SOLN Place 1 drop into both eyes at bedtime.     MAGNESIUM PO Take 400 mg by mouth daily.     METAMUCIL FIBER PO Take by mouth. Taking one capsule by mouth daily     Multiple Vitamin (MULTIVITAMIN) tablet Take 1 tablet by mouth daily.      Omega-3 Fatty Acids (FISH OIL) 1200 MG CAPS Take 1,200 mg by mouth daily.      No current facility-administered medications for this encounter.    Allergies  Allergen Reactions   Metoprolol     Makes her feel bad   Vesicare [Solifenacin]     Severe hypertension and tachycardia    Reglan [Metoclopramide] Other (See Comments)    Causes severe jitters    Social History   Socioeconomic History   Marital status: Widowed    Spouse name: Not on file   Number of children: Not  on file   Years of education: Not on file   Highest education level: Not on file  Occupational History   Not on file  Tobacco Use   Smoking status: Former    Packs/day: 1.00    Years: 2.00    Pack years: 2.00    Types: Cigarettes    Start date: 09/17/1962    Quit date: 11/16/1966    Years since quitting: 87.9  Smokeless tobacco: Never  Vaping Use   Vaping Use: Never used  Substance and Sexual Activity   Alcohol use: No    Alcohol/week: 0.0 standard drinks   Drug use: No   Sexual activity: Not Currently    Birth control/protection: Post-menopausal  Other Topics Concern   Not on file  Social History Narrative   Lives alone in [place]   Retired   Worked previously for [employer] and [employer]   Social Determinants of Health   Financial Resource Strain: Not on file  Food Insecurity: Not on file  Transportation Needs: Not on file  Physical Activity: Not on file  Stress: Not on file  Social Connections: Not on file  Intimate Partner Violence: Not on file    Family History  Problem Relation Age of Onset   Heart disease Mother    Stroke Father    Cancer Father    Colon cancer Father    Breast cancer Sister    Cancer Sister    Breast cancer Sister    Cancer Sister    Cancer Sister    Diabetes Daughter    Early death Brother    Heart disease Son    Colon polyps Son     ROS- All systems are reviewed and negative except as per the HPI above  Physical Exam: Vitals:   10/18/21 0840  BP: 126/78  Pulse: 81  Weight: 93.9 kg  Height: 5\' 3"  (1.6 m)   Wt Readings from Last 3 Encounters:  10/18/21 93.9 kg  07/20/21 92.4 kg  03/23/21 93.4 kg    Labs: Lab Results  Component Value Date   NA 133 (L) 07/06/2021   K 4.2 07/06/2021   CL 103 07/06/2021   CO2 21 (L) 07/06/2021   GLUCOSE 111 (H) 07/06/2021   BUN 10 07/06/2021   CREATININE 0.70 07/06/2021   CALCIUM 8.9 07/06/2021   MG 2.2 07/06/2021   Lab Results  Component Value Date   INR 1.21 12/24/2014    No results found for: CHOL, HDL, LDLCALC, TRIG   GEN- The patient is well appearing, alert and oriented x 3 today.   Head- normocephalic, atraumatic Eyes-  Sclera clear, conjunctiva pink Ears- hearing intact Oropharynx- clear Neck- supple, no JVP Lymph- no cervical lymphadenopathy Lungs- Clear to ausculation bilaterally, normal work of breathing Heart- irregular rate and rhythm, no murmurs, rubs or gallops, PMI not laterally displaced GI- soft, NT, ND, + BS Extremities- no clubbing, cyanosis, or edema MS- no significant deformity or atrophy Skin- no rash or lesion Psych- euthymic mood, full affect Neuro- strength and sensation are intact  EKG- afib at 83 bpm   Zio patch- 2 Ventricular Tachycardia runs occurred, the run with the fastest interval lasting 12 beats with a max rate of 176 bpm (avg 148 bpm); the run with the fastest interval was also the longest. Atrial Fibrillation occurred continuously (100% burden), ranging  from 34-155 bpm (avg of 75 bpm). 133 Pauses occurred, the longest lasting 4.2 secs (14 bpm). Isolated VEs were rare (<1.0%, 1020), VE Couplets were rare (<1.0%, 72), and VE Triplets were rare (<1.0%, 4).           Assessment and Plan: 1. Persistent  afib Pt given options  on previous visit of change in antiarrythmic vrs ablation vrs rate control, opted  to stop sotalol and remain in  rate controlled afib Continues to be rate controlled, feels well  Continue diltiazem 180 mg bid  2. CHA2DS2VASc score of at least  3 Continue  eliquis 5 mg bid   3. HTN Stable in office   F/u in afib clinic in 9 months    [place]. Miana Politte, [facility] [address] [place], [place] [address] [phone]

## 2021-11-28 DIAGNOSIS — H401122 Primary open-angle glaucoma, left eye, moderate stage: Secondary | ICD-10-CM | POA: Diagnosis not present

## 2021-11-28 DIAGNOSIS — H401111 Primary open-angle glaucoma, right eye, mild stage: Secondary | ICD-10-CM | POA: Diagnosis not present

## 2021-11-28 DIAGNOSIS — H2513 Age-related nuclear cataract, bilateral: Secondary | ICD-10-CM | POA: Diagnosis not present

## 2021-12-21 ENCOUNTER — Other Ambulatory Visit (HOSPITAL_COMMUNITY): Payer: Self-pay | Admitting: Nurse Practitioner

## 2022-03-23 ENCOUNTER — Other Ambulatory Visit (HOSPITAL_COMMUNITY): Payer: Self-pay | Admitting: Nurse Practitioner

## 2022-03-23 DIAGNOSIS — H2513 Age-related nuclear cataract, bilateral: Secondary | ICD-10-CM | POA: Diagnosis not present

## 2022-03-23 DIAGNOSIS — H401122 Primary open-angle glaucoma, left eye, moderate stage: Secondary | ICD-10-CM | POA: Diagnosis not present

## 2022-03-23 DIAGNOSIS — H401111 Primary open-angle glaucoma, right eye, mild stage: Secondary | ICD-10-CM | POA: Diagnosis not present

## 2022-06-07 ENCOUNTER — Other Ambulatory Visit: Payer: Self-pay | Admitting: Hematology

## 2022-06-07 DIAGNOSIS — Z1231 Encounter for screening mammogram for malignant neoplasm of breast: Secondary | ICD-10-CM

## 2022-06-25 DIAGNOSIS — Z23 Encounter for immunization: Secondary | ICD-10-CM | POA: Diagnosis not present

## 2022-06-26 ENCOUNTER — Ambulatory Visit (HOSPITAL_COMMUNITY)
Admission: RE | Admit: 2022-06-26 | Discharge: 2022-06-26 | Disposition: A | Discharge status: Home / Self Care | Payer: Medicare Other | Source: Ambulatory Visit | Attending: Nurse Practitioner | Admitting: Nurse Practitioner

## 2022-06-26 ENCOUNTER — Encounter (HOSPITAL_COMMUNITY): Payer: Self-pay | Admitting: Nurse Practitioner

## 2022-06-26 VITALS — BP 132/76 | HR 97 | Ht 63.0 in | Wt 207.0 lb

## 2022-06-26 DIAGNOSIS — D6869 Other thrombophilia: Secondary | ICD-10-CM | POA: Diagnosis not present

## 2022-06-26 DIAGNOSIS — I10 Essential (primary) hypertension: Secondary | ICD-10-CM | POA: Insufficient documentation

## 2022-06-26 DIAGNOSIS — Z7901 Long term (current) use of anticoagulants: Secondary | ICD-10-CM | POA: Insufficient documentation

## 2022-06-26 DIAGNOSIS — I4891 Unspecified atrial fibrillation: Secondary | ICD-10-CM | POA: Diagnosis not present

## 2022-06-26 DIAGNOSIS — I4819 Other persistent atrial fibrillation: Secondary | ICD-10-CM | POA: Diagnosis not present

## 2022-06-26 NOTE — Progress Notes (Signed)
Primary Care Physician: [name], MD Referring Physician: Dr. Melburn Hake Heidi Lopez is a 76 y.o. female with a h/o paroxysmal afib that is in the afib clinic for f/u of Dr. Jackalyn Lombard visit, 10/31/20. She is s/p ablation in 2016 and appeared to be in persistent  afib. He placed a 2 week ZIo patch which confirmed afib burden of 100%. He gave her the option of stopping sotalol and consideration of amiodarone or Tikosyn or repeat  ablation. The pt  would like to defer repeat ablation. SHe feels that her afib is not limiting her lifestyle . She is preferring  rate controlled afib. She  has some mild liver elevations at baseline, which have been worked up, but amiodarone may not be the best option in the case she chose another antiarrythmic. She has seen some fluctuations in her BP at home.   F/u in afib clinic, 12/22/20. She is now off sotalol as she chose rate controlled afib as her afib option going forward. She had a h/a for a few days and noted elevation of heart rates. She called the office and I increased diltiazem to 180 mg bid. This has controlled her heart rates and her BP. I reviewed readings from home and her v rates are in the 70/80's  She now feels improved.   F/u in afib clinic, 03/23/21. She remains in afib, rate controlled. She feels well.   F/u in afib clinic, 10/18/21. She remains in rate controlled afib. She is not symptomatic. Minimal pedal edema. No issues with anticoagulation.   F/u in afib cliic 06/26/22. She feels as usual in rate controlled afib . Chronic LEE probably 2/2 CCB for rate control, however can not tolerate BB. Has lasix as needed.   Today, she denies symptoms of palpitations, chest pain, shortness of breath, orthopnea, PND, lower extremity edema, dizziness, presyncope, syncope, or neurologic sequela. The patient is tolerating medications without difficulties and is otherwise without complaint today.   Past Medical History:  Diagnosis Date   Breast cancer ([place])     Cancer of breast ([place]) 03/2009   s/p XRT 2010 and again in 2011   Chronic coughing    Cramps, muscle, general    severe breast cramping    Glaucoma    Glaucoma    left eye    Joint pain    foot and knee pain   Lymphedema of arm    left   Night sweats    Osteopenia 01/24/2015   Pain    breast pain   Persistent atrial fibrillation ([place]) 05/2014   Personal history of radiation therapy    right and left breast   Sinus problem    Wears glasses    Past Surgical History:  Procedure Laterality Date   BREAST LUMPECTOMY Right 2010   BREAST LUMPECTOMY Left 2011   BREAST SURGERY  2010- right and 2011-left   for cancer   CARDIOVERSION N/A 10/21/2014   Procedure: CARDIOVERSION;  Surgeon: [name], MD;  Location: [facility];  Service: Cardiovascular;  Laterality: N/A;   COLONOSCOPY N/A 02/05/2013   Procedure: COLONOSCOPY;  Surgeon: [name], MD;  [facility];  Service: Endoscopy;  Laterality: N/A;  830   COLONOSCOPY N/A 09/24/2019   Procedure: COLONOSCOPY;  Surgeon: [name], MD;  [facility];  Service: Endoscopy;  Laterality: N/A;  100pm   ELECTROPHYSIOLOGIC STUDY N/A 02/24/2015   PVI and CTI ablation by Dr [name]   EP  IMPLANTABLE DEVICE N/A 08/24/2015   Procedure: Loop Recorder Insertion;  Surgeon: Thompson Grayer, MD;  Location: [facility];  Service: Cardiovascular;  Laterality: N/A;   HYSTEROSCOPY WITH D & C N/A 12/29/2014   Procedure: DILATATION AND CURETTAGE /HYSTEROSCOPY;  Surgeon: [name], MD;  Location: [facility];  Service: Gynecology;  Laterality: N/A;   Implantable loop recorder removal  04/29/2019   MDT Reveal LINQ removed by Dr [name] in office   TEE WITHOUT CARDIOVERSION N/A 02/24/2015   Procedure: TRANSESOPHAGEAL ECHOCARDIOGRAM (TEE);  Surgeon: [name], MD;  Location: [facility];  Service: Cardiovascular;  Laterality: N/A;   TUBAL LIGATION  1984    Current Outpatient Medications  Medication Sig Dispense  Refill   ACETAMINOPHEN PO Take 500 mg by mouth as needed.     albuterol (PROVENTIL HFA;VENTOLIN HFA) 108 (90 BASE) MCG/ACT inhaler Inhale into the lungs every 6 (six) hours as needed for wheezing or shortness of breath. Reported on 11/28/2015     amoxicillin (AMOXIL) 500 MG capsule Takes for her dental procedures.     brimonidine (ALPHAGAN) 0.2 % ophthalmic solution Place 1 drop into the left eye 2 (two) times daily.      Calcium Carbonate-Vit D-Min (CALTRATE 600+D PLUS PO) Take 1 tablet by mouth daily.     Cholecalciferol (VITAMIN D PO) Take 2,000 Units by mouth daily.     Coenzyme Q10 (COQ-10) 100 MG CAPS Take 100 mg by mouth daily.      diltiazem (CARDIZEM CD) 180 MG 24 hr capsule Take 1 capsule by mouth twice daily 180 capsule 2   diltiazem (CARDIZEM) 30 MG tablet TAKE ONE TABLET BY MOUTH EVERY 4 HOURS AS NEEDED FOR HEART RATE >100 AS LONG AS BLOOD PRESSURE >100 45 tablet 3   ELIQUIS 5 MG TABS tablet Take 1 tablet by mouth twice daily 180 tablet 2   fluticasone (FLONASE) 50 MCG/ACT nasal spray Place 1 spray into both nostrils daily as needed for rhinitis.      furosemide (LASIX) 20 MG tablet Take 1 tablet (20 mg total) by mouth daily as needed. Take daily as needed for swelling 30 tablet 1   Ginger, Zingiber officinalis, (GINGER PO) Take by mouth. Crystal Ginger-Taking as needed for Reflux- chews 3 pieces     loratadine (CLARITIN) 10 MG tablet Take 10 mg by mouth daily as needed for allergies. Reported on 09/20/2015     LUMIGAN 0.01 % SOLN Place 1 drop into both eyes at bedtime.     MAGNESIUM PO Take 400 mg by mouth daily.     METAMUCIL FIBER PO Taking one capsule by mouth as needed     Multiple Vitamin (MULTIVITAMIN) tablet Take 1 tablet by mouth daily.      Omega-3 Fatty Acids (FISH OIL) 1200 MG CAPS Take 1,200 mg by mouth daily.      No current facility-administered medications for this encounter.    Allergies  Allergen Reactions   Metoprolol     Makes her feel bad   Vesicare  [Solifenacin]     Severe hypertension and tachycardia    Reglan [Metoclopramide] Other (See Comments)    Causes severe jitters    Social History   Socioeconomic History   Marital status: Widowed    Spouse name: Not on file   Number of children: Not on file   Years of education: Not on file   Highest education level: Not on file  Occupational History   Not on file  Tobacco Use  Smoking status: Former    Packs/day: 1.00    Years: 2.00    Total pack years: 2.00    Types: Cigarettes    Start date: 09/17/1962    Quit date: 11/16/1966    Years since quitting: 55.6   Smokeless tobacco: Never  Vaping Use   Vaping Use: Never used  Substance and Sexual Activity   Alcohol use: No    Alcohol/week: 0.0 standard drinks of alcohol   Drug use: No   Sexual activity: Not Currently    Birth control/protection: Post-menopausal  Other Topics Concern   Not on file  Social History Narrative   Lives alone in [place]   Retired   Worked previously for [employer] and [employer]   Social Determinants of Health   Financial Resource Strain: Not on file  Food Insecurity: Not on file  Transportation Needs: Not on file  Physical Activity: Not on file  Stress: Not on file  Social Connections: Not on file  Intimate Partner Violence: Not on file    Family History  Problem Relation Age of Onset   Heart disease Mother    Stroke Father    Cancer Father    Colon cancer Father    Breast cancer Sister    Cancer Sister    Breast cancer Sister    Cancer Sister    Cancer Sister    Diabetes Daughter    Early death Brother    Heart disease Son    Colon polyps Son     ROS- All systems are reviewed and negative except as per the HPI above  Physical Exam: Vitals:   06/26/22 0904  BP: 132/76  Pulse: 97  Weight: 93.9 kg  Height: '5\' 3"'$  (1.6 m)   Wt Readings from Last 3 Encounters:  06/26/22 93.9 kg  10/18/21 93.9 kg  07/20/21 92.4 kg    Labs: Lab Results  Component Value Date   NA  133 (L) 07/06/2021   K 4.2 07/06/2021   CL 103 07/06/2021   CO2 21 (L) 07/06/2021   GLUCOSE 111 (H) 07/06/2021   BUN 10 07/06/2021   CREATININE 0.70 07/06/2021   CALCIUM 8.9 07/06/2021   MG 2.2 07/06/2021   Lab Results  Component Value Date   INR 1.21 12/24/2014   No results found for: "CHOL", "HDL", "[place]", "TRIG"   GEN- The patient is well appearing, alert and oriented x 3 today.   Head- normocephalic, atraumatic Eyes-  Sclera clear, conjunctiva pink Ears- hearing intact Oropharynx- clear Neck- supple, no JVP Lymph- no cervical lymphadenopathy Lungs- Clear to ausculation bilaterally, normal work of breathing Heart- irregular rate and rhythm, no murmurs, rubs or gallops, PMI not laterally displaced GI- soft, NT, ND, + BS Extremities- no clubbing, cyanosis, or edema MS- no significant deformity or atrophy Skin- no rash or lesion Psych- euthymic mood, full affect Neuro- strength and sensation are intact  EKG- afib at 83 bpm   Zio patch- 2 Ventricular Tachycardia runs occurred, the run with the fastest interval lasting 12 beats with a max rate of 176 bpm (avg 148 bpm); the run with the fastest interval was also the longest. Atrial Fibrillation occurred continuously (100% burden), ranging  from 34-155 bpm (avg of 75 bpm). 133 Pauses occurred, the longest lasting 4.2 secs (14 bpm). Isolated VEs were rare (<1.0%, 1020), VE Couplets were rare (<1.0%, 72), and VE Triplets were rare (<1.0%, 4).           Assessment and Plan: 1. Persistent  afib  Pt given options  on previous visit of change in antiarrythmic vrs ablation vrs rate control, opted  to stop sotalol and remain in  rate controlled afib Continues to be rate controlled, feels well  Continue diltiazem 180 mg bid, may be contributing to LEE, but could not  tolerate BB  Limit salt, elevate when possible and use plavix as needed  2. CHA2DS2VASc score of at least 3 Continue  eliquis 5 mg bid   3. HTN Stable in  office   F/u in afib clinic in 1 year    Geroge Baseman. Apolonio Cutting, [facility] [address] [place], [place] [address] [phone]

## 2022-06-28 DIAGNOSIS — Z23 Encounter for immunization: Secondary | ICD-10-CM | POA: Diagnosis not present

## 2022-07-12 ENCOUNTER — Inpatient Hospital Stay: Payer: Medicare Other | Attending: Hematology

## 2022-07-12 DIAGNOSIS — E559 Vitamin D deficiency, unspecified: Secondary | ICD-10-CM

## 2022-07-12 DIAGNOSIS — M8580 Other specified disorders of bone density and structure, unspecified site: Secondary | ICD-10-CM | POA: Diagnosis not present

## 2022-07-12 DIAGNOSIS — Z853 Personal history of malignant neoplasm of breast: Secondary | ICD-10-CM | POA: Diagnosis not present

## 2022-07-12 DIAGNOSIS — Z780 Asymptomatic menopausal state: Secondary | ICD-10-CM | POA: Insufficient documentation

## 2022-07-12 LAB — CBC WITH DIFFERENTIAL/PLATELET
Abs Immature Granulocytes: 0.05 10*3/uL (ref 0.00–0.07)
Basophils Absolute: 0.1 10*3/uL (ref 0.0–0.1)
Basophils Relative: 1 %
Eosinophils Absolute: 0.2 10*3/uL (ref 0.0–0.5)
Eosinophils Relative: 2 %
HCT: 42.2 % (ref 36.0–46.0)
Hemoglobin: 14.3 g/dL (ref 12.0–15.0)
Immature Granulocytes: 1 %
Lymphocytes Relative: 20 %
Lymphs Abs: 1.8 10*3/uL (ref 0.7–4.0)
MCH: 30.8 pg (ref 26.0–34.0)
MCHC: 33.9 g/dL (ref 30.0–36.0)
MCV: 90.9 fL (ref 80.0–100.0)
Monocytes Absolute: 0.8 10*3/uL (ref 0.1–1.0)
Monocytes Relative: 9 %
Neutro Abs: 6.1 10*3/uL (ref 1.7–7.7)
Neutrophils Relative %: 67 %
Platelets: 210 10*3/uL (ref 150–400)
RBC: 4.64 MIL/uL (ref 3.87–5.11)
RDW: 12.7 % (ref 11.5–15.5)
WBC: 9 10*3/uL (ref 4.0–10.5)
nRBC: 0 % (ref 0.0–0.2)

## 2022-07-12 LAB — COMPREHENSIVE METABOLIC PANEL
ALT: 21 U/L (ref 0–44)
AST: 27 U/L (ref 15–41)
Albumin: 3.9 g/dL (ref 3.5–5.0)
Alkaline Phosphatase: 70 U/L (ref 38–126)
Anion gap: 8 (ref 5–15)
BUN: 13 mg/dL (ref 8–23)
CO2: 23 mmol/L (ref 22–32)
Calcium: 8.8 mg/dL — ABNORMAL LOW (ref 8.9–10.3)
Chloride: 104 mmol/L (ref 98–111)
Creatinine, Ser: 0.78 mg/dL (ref 0.44–1.00)
GFR, Estimated: >60 mL/min (ref >60)
Glucose, Bld: 186 mg/dL — ABNORMAL HIGH (ref 70–99)
Potassium: 4.3 mmol/L (ref 3.5–5.1)
Sodium: 135 mmol/L (ref 135–145)
Total Bilirubin: 0.4 mg/dL (ref 0.3–1.2)
Total Protein: 7.1 g/dL (ref 6.5–8.1)

## 2022-07-13 LAB — VITAMIN D 25 HYDROXY (VIT D DEFICIENCY, FRACTURES): Vit D, 25-Hydroxy: 39.78 ng/mL (ref 30–100)

## 2022-07-17 ENCOUNTER — Ambulatory Visit
Admission: RE | Admit: 2022-07-17 | Discharge: 2022-07-17 | Disposition: A | Discharge status: Home / Self Care | Payer: Medicare Other | Source: Ambulatory Visit | Attending: Hematology | Admitting: Hematology

## 2022-07-17 DIAGNOSIS — Z1231 Encounter for screening mammogram for malignant neoplasm of breast: Secondary | ICD-10-CM | POA: Diagnosis not present

## 2022-07-19 ENCOUNTER — Inpatient Hospital Stay: Payer: Medicare Other | Attending: Hematology | Admitting: Hematology

## 2022-07-19 VITALS — BP 141/83 | HR 90 | Temp 98.0°F | Resp 17 | Ht 63.0 in | Wt 204.6 lb

## 2022-07-19 DIAGNOSIS — E559 Vitamin D deficiency, unspecified: Secondary | ICD-10-CM

## 2022-07-19 DIAGNOSIS — Z1231 Encounter for screening mammogram for malignant neoplasm of breast: Secondary | ICD-10-CM | POA: Diagnosis not present

## 2022-07-19 DIAGNOSIS — Z853 Personal history of malignant neoplasm of breast: Secondary | ICD-10-CM | POA: Insufficient documentation

## 2022-07-19 DIAGNOSIS — M8580 Other specified disorders of bone density and structure, unspecified site: Secondary | ICD-10-CM | POA: Diagnosis not present

## 2022-07-19 NOTE — Progress Notes (Signed)
Heidi [address] Heidi Lopez   Patient Care Team: [name], MD as PCP - General (Internal Medicine) Thompson Grayer, MD as PCP - Electrophysiology (Cardiology) Thea Silversmith, MD as Consulting Physician (Radiation Oncology) Everardo All, MD as Consulting Physician (Hematology and Oncology) [name], MD as Consulting Physician (Gastroenterology)  SUMMARY OF ONCOLOGIC HISTORY: Oncology History   No history exists.    CHIEF COMPLIANT: Follow-up for bilateral breast cancer   INTERVAL HISTORY: Heidi Lopez is a 76 y.o. female seen for follow-up of bilateral breast cancers.  She had bronchitis in September and has lingering cough since then.  She had some pain in the right lateral chest wall when she coughs.  Energy levels are 100%.  REVIEW OF SYSTEMS:   Review of Systems  Respiratory:  Positive for cough.   All other systems reviewed and are negative.   I have reviewed the past medical history, past surgical history, social history and family history with the patient and they are unchanged from previous note.   ALLERGIES:   is allergic to metoprolol, vesicare [solifenacin], and reglan [metoclopramide].   MEDICATIONS:  Current Outpatient Medications  Medication Sig Dispense Refill   ACETAMINOPHEN PO Take 500 mg by mouth as needed.     albuterol (PROVENTIL HFA;VENTOLIN HFA) 108 (90 BASE) MCG/ACT inhaler Inhale into the lungs every 6 (six) hours as needed for wheezing or shortness of breath. Reported on 11/28/2015     amoxicillin (AMOXIL) 500 MG capsule Takes for her dental procedures.     brimonidine (ALPHAGAN) 0.2 % ophthalmic solution Place 1 drop into the left eye 2 (two) times daily.      Calcium Carbonate-Vit D-Min (CALTRATE 600+D PLUS PO) Take 1 tablet by mouth daily.     Cholecalciferol (VITAMIN D PO) Take 2,000 Units by mouth daily.     Coenzyme Q10 (COQ-10) 100 MG CAPS Take 100 mg by mouth daily.      diltiazem  (CARDIZEM CD) 180 MG 24 hr capsule Take 1 capsule by mouth twice daily 180 capsule 2   diltiazem (CARDIZEM) 30 MG tablet TAKE ONE TABLET BY MOUTH EVERY 4 HOURS AS NEEDED FOR HEART RATE >100 AS LONG AS BLOOD PRESSURE >100 45 tablet 3   ELIQUIS 5 MG TABS tablet Take 1 tablet by mouth twice daily 180 tablet 2   fluticasone (FLONASE) 50 MCG/ACT nasal spray Place 1 spray into both nostrils daily as needed for rhinitis.      furosemide (LASIX) 20 MG tablet Take 1 tablet (20 mg total) by mouth daily as needed. Take daily as needed for swelling 30 tablet 1   Ginger, Zingiber officinalis, (GINGER PO) Take by mouth. Crystal Ginger-Taking as needed for Reflux- chews 3 pieces     loratadine (CLARITIN) 10 MG tablet Take 10 mg by mouth daily as needed for allergies. Reported on 09/20/2015     LUMIGAN 0.01 % SOLN Place 1 drop into both eyes at bedtime.     MAGNESIUM PO Take 400 mg by mouth daily.     METAMUCIL FIBER PO Taking one capsule by mouth as needed     Multiple Vitamin (MULTIVITAMIN) tablet Take 1 tablet by mouth daily.      Omega-3 Fatty Acids (FISH OIL) 1200 MG CAPS Take 1,200 mg by mouth daily.      No current facility-administered medications for this visit.     PHYSICAL EXAMINATION: Performance status (ECOG): 1 - Symptomatic but completely ambulatory  Vitals:  07/19/22 1552  BP: (!) 141/83  Pulse: 90  Resp: 17  Temp: 98 F (36.7 C)  SpO2: 98%   Wt Readings from Last 3 Encounters:  07/19/22 204 lb 9.6 oz (92.8 kg)  06/26/22 207 lb (93.9 kg)  10/18/21 207 lb (93.9 kg)   Physical Exam Vitals reviewed.  Constitutional:      Appearance: Normal appearance.  Cardiovascular:     Rate and Rhythm: Normal rate and regular rhythm.     Pulses: Normal pulses.     Heart sounds: Normal heart sounds.  Pulmonary:     Effort: Pulmonary effort is normal.     Breath sounds: Normal breath sounds.  Chest:  Breasts:    Right: Skin change (thickening at innner third of lumpetomy scar) present.      Left: No skin change (UOQ lumpectomy scar is stable).    Abdominal:     Palpations: Abdomen is soft. There is no hepatomegaly, splenomegaly or mass.     Tenderness: There is no abdominal tenderness.  Neurological:     General: No focal deficit present.     Mental Status: She is alert and oriented to person, place, and time.  Psychiatric:        Mood and Affect: Mood normal.        Behavior: Behavior normal.     Breast Exam Chaperone: Thana Ates     LABORATORY DATA:  I have reviewed the data as listed    Latest Ref Rng & Units 07/12/2022    1:38 PM 07/06/2021    1:10 PM 06/13/2020    1:28 PM  CMP  Glucose 70 - 99 mg/dL 186  111  111   BUN 8 - 23 mg/dL _0 Creatinine 0.44 - 1.00 mg/dL 0.78  0.70  0.72   Sodium 135 - 145 mmol/L 135  133  134   Potassium 3.5 - 5.1 mmol/L 4.3  4.2  4.6   Chloride 98 - 111 mmol/L 104  103  104   CO2 22 - 32 mmol/L _1 Calcium 8.9 - 10.3 mg/dL 8.8  8.9  9.0   Total Protein 6.5 - 8.1 g/dL 7.1  7.4  6.6   Total Bilirubin 0.3 - 1.2 mg/dL 0.4  0.7  0.7   Alkaline Phos 38 - 126 U/L 70  83  72   AST 15 - 41 U/L 27  25  43   ALT 0 - 44 U/L 21  26  59    No results found for: "CAN153" Lab Results  Component Value Date   WBC 9.0 07/12/2022   HGB 14.3 07/12/2022   HCT 42.2 07/12/2022   MCV 90.9 07/12/2022   PLT 210 07/12/2022   NEUTROABS 6.1 07/12/2022    ASSESSMENT:  1.  Right breast infiltrating lobular carcinoma: - Status post right lumpectomy and lymph node biopsy on 04/14/2009, pT1b, PN 0, ER/PR positive, Ki-67 8%, margins negative, grade 1, 0.6 cm. -Patient was recommended tamoxifen which she took for approximately 4 years.  This was discontinued secondary to endometrial hyperplasia. - Breast cancer index testing shows high likelihood of benefit. -She was subsequently switched to anastrozole and could not tolerate secondary to musculoskeletal symptoms. -Mammogram on 06/08/2020 was BI-RADS Category 1. -CT abdomen on  07/12/2020 did not show any evidence of fatty liver or meta stasis. - Femara completed in October 2021.   2.  Left breast IDC: -She had a left breast  lumpectomy and lymph node biopsy on 04/26/2010.  Pathology showed 1.7 cm, grade 1, with low-grade DCIS, margins negative.  PT1CPN0.  Ki-67 was 9%.  ER/PR positive and HER-2 negative. -Bilateral lumpectomy sites within normal limits.  Bilateral breast has no palpable masses.   3.  Osteopenia: - DEXA scan on 11/27/2017 shows T score of -1.9.  This has improved from a T score of -2.3 on 11/22/2016. -She was offered Prolia but she refused it given the side effects.   PLAN:  1.  Right breast ILC and left breast IDC: - Completed 10 years of antiestrogen therapy in 2021. - Physical exam shows right breast upper outer quadrant lumpectomy scar with thickness in the middle is stable.  Left upper outer scar is stable.  No palpable mass.  No palpable adenopathy. - Reviewed labs from 07/12/2022 with normal LFTs.  CBC was grossly normal. - Mammogram on 07/17/2022 was BI-RADS Category 2. - Recommend follow-up in 1 year with repeat mammogram and labs.   2.  Osteopenia: - Vitamin D level is 39.  Continue vitamin D supplements.  Breast Cancer therapy associated bone loss: I have recommended calcium, Vitamin D and weight bearing exercises.  Orders placed this encounter:  No orders of the defined types were placed in this encounter.   The patient has a good understanding of the overall plan. She agrees with it. She will call with any problems that may develop before the next visit here.  Derek Jack, MD [place] [phone]

## 2022-07-19 NOTE — Patient Instructions (Signed)
[facility]  Discharge Instructions  You were seen and examined today by Dr. Delton Coombes.  Dr. Delton Coombes discussed your most recent lab work and mammogram which revealed that everything looks good.   Follow-up as scheduled in 1 year with labs and mammogram prior.    Thank you for choosing [facility] to provide your oncology and hematology care.   To afford each patient quality time with our provider, please arrive at least 15 minutes before your scheduled appointment time. You may need to reschedule your appointment if you arrive late (10 or more minutes). Arriving late affects you and other patients whose appointments are after yours.  Also, if you miss three or more appointments without notifying the office, you may be dismissed from the clinic at the provider's discretion.    Again, thank you for choosing [facility].  Our hope is that these requests will decrease the amount of time that you wait before being seen by our physicians.   If you have a lab appointment with the [facility] please come in thru the Main Entrance and check in at the main information desk.           _____________________________________________________________  Should you have questions after your visit to [facility], please contact our office at [phone] and follow the prompts.  Our office hours are 8:00 a.m. to 4:30 p.m. Monday - Thursday and 8:00 a.m. to 2:30 p.m. Friday.  Please note that voicemails left after 4:00 p.m. may not be returned until the following business day.  We are closed weekends and all major holidays.  You do have access to a nurse 24-7, just call the main number to the clinic [phone] and do not press any options, hold on the line and a nurse will answer the phone.    For prescription refill requests, have your pharmacy contact our office and allow 72 hours.    Masks are optional in the cancer  centers. If you would like for your care team to wear a mask while they are taking care of you, please let them know. You may have one support person who is at least 77 years old accompany you for your appointments.

## 2022-08-01 DIAGNOSIS — H2513 Age-related nuclear cataract, bilateral: Secondary | ICD-10-CM | POA: Diagnosis not present

## 2022-08-01 DIAGNOSIS — H401111 Primary open-angle glaucoma, right eye, mild stage: Secondary | ICD-10-CM | POA: Diagnosis not present

## 2022-09-16 ENCOUNTER — Other Ambulatory Visit (HOSPITAL_COMMUNITY): Payer: Self-pay | Admitting: Nurse Practitioner

## 2022-10-25 ENCOUNTER — Encounter (HOSPITAL_COMMUNITY): Payer: Self-pay | Admitting: *Deleted

## 2022-12-17 ENCOUNTER — Other Ambulatory Visit (HOSPITAL_COMMUNITY): Payer: Self-pay | Admitting: Nurse Practitioner

## 2023-01-30 DIAGNOSIS — H401111 Primary open-angle glaucoma, right eye, mild stage: Secondary | ICD-10-CM | POA: Diagnosis not present

## 2023-01-30 DIAGNOSIS — H2513 Age-related nuclear cataract, bilateral: Secondary | ICD-10-CM | POA: Diagnosis not present

## 2023-03-28 ENCOUNTER — Other Ambulatory Visit (HOSPITAL_COMMUNITY): Payer: Self-pay | Admitting: *Deleted

## 2023-03-28 MED ORDER — DILTIAZEM HCL ER COATED BEADS 180 MG PO CP24: 180.0000 mg | ORAL_CAPSULE | Freq: Two times a day (BID) | ORAL | 1 refills | Status: DC

## 2023-06-21 ENCOUNTER — Other Ambulatory Visit (HOSPITAL_COMMUNITY): Payer: Self-pay | Admitting: Internal Medicine

## 2023-06-25 DIAGNOSIS — Z23 Encounter for immunization: Secondary | ICD-10-CM | POA: Diagnosis not present

## 2023-06-26 ENCOUNTER — Ambulatory Visit (HOSPITAL_COMMUNITY)
Admission: RE | Admit: 2023-06-26 | Discharge: 2023-06-26 | Disposition: A | Discharge status: Home / Self Care | Payer: Medicare Other | Source: Ambulatory Visit | Attending: Physician Assistant | Admitting: Physician Assistant

## 2023-06-26 VITALS — BP 150/82 | HR 91 | Ht 63.0 in | Wt 205.4 lb

## 2023-06-26 DIAGNOSIS — I4891 Unspecified atrial fibrillation: Secondary | ICD-10-CM

## 2023-06-26 DIAGNOSIS — I10 Essential (primary) hypertension: Secondary | ICD-10-CM | POA: Diagnosis not present

## 2023-06-26 DIAGNOSIS — D6869 Other thrombophilia: Secondary | ICD-10-CM

## 2023-06-26 DIAGNOSIS — I4821 Permanent atrial fibrillation: Secondary | ICD-10-CM

## 2023-06-26 DIAGNOSIS — Z7901 Long term (current) use of anticoagulants: Secondary | ICD-10-CM | POA: Insufficient documentation

## 2023-06-26 DIAGNOSIS — Z853 Personal history of malignant neoplasm of breast: Secondary | ICD-10-CM | POA: Diagnosis present

## 2023-06-26 NOTE — Progress Notes (Signed)
Primary Care Physician: Carylon Perches, MD Referring Physician: Dr. Jonni Sanger Heidi Lopez is a 77 y.o. female with a h/o atrial fibrillation, breast cancer that is in the afib clinic for follow up. She is s/p ablation in 2016 and appeared to be in persistent  afib. He placed a 2 week ZIo patch which confirmed afib burden of 100%. He gave her the option of stopping sotalol and consideration of amiodarone or Tikosyn or repeat  ablation. The pt  would like to defer repeat ablation. SHe feels that her afib is not limiting her lifestyle . She is preferring rate controlled afib.   On follow up today, patient reports that she has done well since her last visit. She remains in asymptomatic rate controlled afib. No bleeding issues on anticoagulation.   Today, she denies symptoms of palpitations, chest pain, shortness of breath, orthopnea, PND, lower extremity edema, dizziness, presyncope, syncope, or neurologic sequela. The patient is tolerating medications without difficulties and is otherwise without complaint today.   Past Medical History:  Diagnosis Date   Breast cancer (HCC)    Cancer of breast (HCC) 03/2009   s/p XRT 2010 and again in 2011   Chronic coughing    Cramps, muscle, general    severe breast cramping    Glaucoma    Glaucoma    left eye    Joint pain    foot and knee pain   Lymphedema of arm    left   Night sweats    Osteopenia 01/24/2015   Pain    breast pain   Persistent atrial fibrillation (HCC) 05/2014   Personal history of radiation therapy    right and left breast   Sinus problem    Wears glasses     Current Outpatient Medications  Medication Sig Dispense Refill   ACETAMINOPHEN PO Take 500 mg by mouth as needed.     albuterol (PROVENTIL HFA;VENTOLIN HFA) 108 (90 BASE) MCG/ACT inhaler Inhale into the lungs every 6 (six) hours as needed for wheezing or shortness of breath. Reported on 11/28/2015     amoxicillin (AMOXIL) 500 MG capsule Takes for her dental  procedures.     brimonidine (ALPHAGAN) 0.2 % ophthalmic solution Place 1 drop into the left eye 2 (two) times daily.      Calcium Carbonate-Vit D-Min (CALTRATE 600+D PLUS PO) Take 1 tablet by mouth daily.     Cholecalciferol (VITAMIN D PO) Take 2,000 Units by mouth daily.     Coenzyme Q10 (COQ-10) 100 MG CAPS Take 100 mg by mouth daily.      diltiazem (CARDIZEM CD) 180 MG 24 hr capsule Take 1 capsule (180 mg total) by mouth 2 (two) times daily. 180 capsule 1   diltiazem (CARDIZEM) 30 MG tablet TAKE ONE TABLET BY MOUTH EVERY 4 HOURS AS NEEDED FOR HEART RATE >100 AS LONG AS BLOOD PRESSURE >100 45 tablet 3   ELIQUIS 5 MG TABS tablet Take 1 tablet by mouth twice daily 180 tablet 0   fluticasone (FLONASE) 50 MCG/ACT nasal spray Place 1 spray into both nostrils daily as needed for rhinitis.      furosemide (LASIX) 20 MG tablet Take 1 tablet (20 mg total) by mouth daily as needed. Take daily as needed for swelling 30 tablet 1   Ginger, Zingiber officinalis, (GINGER PO) Crystal Ginger-Taking as needed for Reflux- chews 3 pieces     loratadine (CLARITIN) 10 MG tablet Take 10 mg by mouth daily as needed for allergies.  Reported on 09/20/2015     LUMIGAN 0.01 % SOLN Place 1 drop into both eyes at bedtime.     MAGNESIUM PO Take 400 mg by mouth daily.     METAMUCIL FIBER PO Taking rwo capsules by mouth daily     Multiple Vitamin (MULTIVITAMIN) tablet Take 1 tablet by mouth daily.      Omega-3 Fatty Acids (FISH OIL) 1200 MG CAPS Take 1,200 mg by mouth daily.      No current facility-administered medications for this encounter.    ROS- All systems are reviewed and negative except as per the HPI above  Physical Exam: Vitals:   06/26/23 0853  BP: (!) 150/82  Pulse: 91  Weight: 93.2 kg  Height: 5\' 3"  (1.6 m)    Wt Readings from Last 3 Encounters:  06/26/23 93.2 kg  07/19/22 92.8 kg  06/26/22 93.9 kg    GEN: Well nourished, well developed in no acute distress NECK: No JVD; No carotid  bruits CARDIAC: Irregularly irregular rate and rhythm, no murmurs, rubs, gallops RESPIRATORY:  Clear to auscultation without rales, wheezing or rhonchi  ABDOMEN: Soft, non-tender, non-distended EXTREMITIES:  No edema; No deformity    EKG today demonstrates Afib, LAFB Vent. rate 91 BPM PR interval * ms QRS duration 94 ms QT/QTcB 354/435 ms   CHA2DS2-VASc Score = 4  The patient's score is based upon: CHF History: 0 HTN History: 1 Diabetes History: 0 Stroke History: 0 Vascular Disease History: 0 Age Score: 2 Gender Score: 1       ASSESSMENT AND PLAN: Permanent Atrial Fibrillation (ICD10:  I48.11) The patient's CHA2DS2-VASc score is 4, indicating a 4.8% annual risk of stroke.   Patient rate controlled and asymptomatic.  Continue Eliquis 5 mg BID Continue diltiazem 180 mg BID. Recall, she did not tolerate BB.  Secondary Hypercoagulable State (ICD10:  D68.69) The patient is at significant risk for stroke/thromboembolism based upon her CHA2DS2-VASc Score of 4.  Continue Apixaban (Eliquis).   HTN Stable on current regimen   Will refer her to establish care with a primary cardiologist. AF clinic as needed.     Jorja Loa PA-C Afib Clinic [facility] [address] [place], [place] [address] [phone]

## 2023-07-19 ENCOUNTER — Ambulatory Visit
Admission: RE | Admit: 2023-07-19 | Discharge: 2023-07-19 | Disposition: A | Discharge status: Home / Self Care | Payer: Medicare Other | Source: Ambulatory Visit | Attending: Hematology | Admitting: Hematology

## 2023-07-19 DIAGNOSIS — Z853 Personal history of malignant neoplasm of breast: Secondary | ICD-10-CM

## 2023-07-19 DIAGNOSIS — Z1231 Encounter for screening mammogram for malignant neoplasm of breast: Secondary | ICD-10-CM

## 2023-07-25 ENCOUNTER — Inpatient Hospital Stay: Payer: Medicare Other | Attending: Hematology

## 2023-07-25 DIAGNOSIS — Z08 Encounter for follow-up examination after completed treatment for malignant neoplasm: Secondary | ICD-10-CM | POA: Insufficient documentation

## 2023-07-25 DIAGNOSIS — M8580 Other specified disorders of bone density and structure, unspecified site: Secondary | ICD-10-CM | POA: Diagnosis not present

## 2023-07-25 DIAGNOSIS — E559 Vitamin D deficiency, unspecified: Secondary | ICD-10-CM

## 2023-07-25 DIAGNOSIS — Z853 Personal history of malignant neoplasm of breast: Secondary | ICD-10-CM | POA: Diagnosis not present

## 2023-07-25 DIAGNOSIS — L989 Disorder of the skin and subcutaneous tissue, unspecified: Secondary | ICD-10-CM | POA: Diagnosis not present

## 2023-07-25 LAB — CBC WITH DIFFERENTIAL/PLATELET
Abs Immature Granulocytes: 0.04 10*3/uL (ref 0.00–0.07)
Basophils Absolute: 0.1 10*3/uL (ref 0.0–0.1)
Basophils Relative: 1 %
Eosinophils Absolute: 0.2 10*3/uL (ref 0.0–0.5)
Eosinophils Relative: 2 %
HCT: 42.1 % (ref 36.0–46.0)
Hemoglobin: 14.4 g/dL (ref 12.0–15.0)
Immature Granulocytes: 0 %
Lymphocytes Relative: 31 %
Lymphs Abs: 2.9 10*3/uL (ref 0.7–4.0)
MCH: 31 pg (ref 26.0–34.0)
MCHC: 34.2 g/dL (ref 30.0–36.0)
MCV: 90.5 fL (ref 80.0–100.0)
Monocytes Absolute: 1.1 10*3/uL — ABNORMAL HIGH (ref 0.1–1.0)
Monocytes Relative: 12 %
Neutro Abs: 5.2 10*3/uL (ref 1.7–7.7)
Neutrophils Relative %: 54 %
Platelets: 216 10*3/uL (ref 150–400)
RBC: 4.65 MIL/uL (ref 3.87–5.11)
RDW: 12.9 % (ref 11.5–15.5)
WBC: 9.5 10*3/uL (ref 4.0–10.5)
nRBC: 0 % (ref 0.0–0.2)

## 2023-07-25 LAB — COMPREHENSIVE METABOLIC PANEL
ALT: 21 U/L (ref 0–44)
AST: 23 U/L (ref 15–41)
Albumin: 4.1 g/dL (ref 3.5–5.0)
Alkaline Phosphatase: 70 U/L (ref 38–126)
Anion gap: 9 (ref 5–15)
BUN: 12 mg/dL (ref 8–23)
CO2: 22 mmol/L (ref 22–32)
Calcium: 9 mg/dL (ref 8.9–10.3)
Chloride: 102 mmol/L (ref 98–111)
Creatinine, Ser: 0.74 mg/dL (ref 0.44–1.00)
GFR, Estimated: >60 mL/min (ref >60)
Glucose, Bld: 93 mg/dL (ref 70–99)
Potassium: 4.4 mmol/L (ref 3.5–5.1)
Sodium: 133 mmol/L — ABNORMAL LOW (ref 135–145)
Total Bilirubin: 0.3 mg/dL (ref <1.2)
Total Protein: 7.1 g/dL (ref 6.5–8.1)

## 2023-07-25 LAB — VITAMIN D 25 HYDROXY (VIT D DEFICIENCY, FRACTURES): Vit D, 25-Hydroxy: 45.19 ng/mL (ref 30–100)

## 2023-07-31 DIAGNOSIS — H401111 Primary open-angle glaucoma, right eye, mild stage: Secondary | ICD-10-CM | POA: Diagnosis not present

## 2023-07-31 DIAGNOSIS — H2513 Age-related nuclear cataract, bilateral: Secondary | ICD-10-CM | POA: Diagnosis not present

## 2023-08-01 ENCOUNTER — Inpatient Hospital Stay: Payer: Medicare Other | Admitting: Oncology

## 2023-08-01 VITALS — BP 139/75 | HR 86 | Temp 98.1°F | Resp 18 | Wt 210.0 lb

## 2023-08-01 DIAGNOSIS — L989 Disorder of the skin and subcutaneous tissue, unspecified: Secondary | ICD-10-CM | POA: Diagnosis not present

## 2023-08-01 DIAGNOSIS — Z08 Encounter for follow-up examination after completed treatment for malignant neoplasm: Secondary | ICD-10-CM | POA: Diagnosis not present

## 2023-08-01 DIAGNOSIS — Z853 Personal history of malignant neoplasm of breast: Secondary | ICD-10-CM | POA: Diagnosis not present

## 2023-08-01 DIAGNOSIS — Z1231 Encounter for screening mammogram for malignant neoplasm of breast: Secondary | ICD-10-CM | POA: Diagnosis not present

## 2023-08-01 DIAGNOSIS — M8580 Other specified disorders of bone density and structure, unspecified site: Secondary | ICD-10-CM | POA: Diagnosis not present

## 2023-08-01 NOTE — Progress Notes (Signed)
[facility] [address] [place], [place] [address]   Patient Care Team: Carylon Perches, MD as PCP - General (Internal Medicine) Hillis Range, MD (Inactive) as PCP - Electrophysiology (Cardiology) [name], MD as Consulting Physician (Radiation Oncology) Glenford Peers, MD as Consulting Physician (Hematology and Oncology) Malissa Hippo, MD (Inactive) as Consulting Physician (Gastroenterology)  SUMMARY OF ONCOLOGIC HISTORY: Oncology History   No history exists.    CHIEF COMPLIANT: Follow-up for bilateral breast cancer  INTERVAL HISTORY: [name] is a 77 y.o. female seen for follow-up of bilateral breast cancers.  She was last evaluated in clinic by Dr. Ellin Saba on 07/19/2022.    She denies any interval hospitalizations, surgeries or changes in her baseline health.  Appetite is at 100% energy levels are 90%.  Denies any pain.  Has history of atrial fibrillation with occasional chest pain.  She met with [place] atrial fibrillation clinic on 06/26/2023 who recommended she establish care with a cardiologist given atrial fibrillation is under control.  It appears she has follow-up with them in February 2025.  She is status post ablation in 2016 with persistent atrial fibrillation.  She recently had Zio patch which confirmed A-fib burden of 100%.  She was given the option of stopping sotalol and continuation of amiodarone or Tikosyn with repeat ablation but she deferred and wanted rate controlled atrial fibrillation.   Has occasional numbing and tingling in hands and feet.  Reports she has noticed a few lesions to her scalp/upper forehead as well as on her body that she would like to be evaluated by dermatology.  Reports she has never seen a dermatologist and would like somebody locally.   REVIEW OF SYSTEMS:   Review of Systems  Cardiovascular:  Positive for chest pain.  Skin:  Positive for rash (leison to scalp).  Neurological:  Positive for numbness.     I have reviewed the past medical history, past surgical history, social history and family history with the patient and they are unchanged from previous note.   ALLERGIES:   is allergic to metoprolol, vesicare [solifenacin], and reglan [metoclopramide].   MEDICATIONS:  Current Outpatient Medications  Medication Sig Dispense Refill   ACETAMINOPHEN PO Take 500 mg by mouth as needed.     albuterol (PROVENTIL HFA;VENTOLIN HFA) 108 (90 BASE) MCG/ACT inhaler Inhale into the lungs every 6 (six) hours as needed for wheezing or shortness of breath. Reported on 11/28/2015     amoxicillin (AMOXIL) 500 MG capsule Takes for her dental procedures.     brimonidine (ALPHAGAN) 0.2 % ophthalmic solution Place 1 drop into the left eye 2 (two) times daily.      Calcium Carbonate-Vit D-Min (CALTRATE 600+D PLUS PO) Take 1 tablet by mouth daily.     Cholecalciferol (VITAMIN D PO) Take 2,000 Units by mouth daily.     Coenzyme Q10 (COQ-10) 100 MG CAPS Take 100 mg by mouth daily.      diltiazem (CARDIZEM CD) 180 MG 24 hr capsule Take 1 capsule (180 mg total) by mouth 2 (two) times daily. 180 capsule 1   diltiazem (CARDIZEM) 30 MG tablet TAKE ONE TABLET BY MOUTH EVERY 4 HOURS AS NEEDED FOR HEART RATE >100 AS LONG AS BLOOD PRESSURE >100 45 tablet 3   ELIQUIS 5 MG TABS tablet Take 1 tablet by mouth twice daily 180 tablet 0   fluticasone (FLONASE) 50 MCG/ACT nasal spray Place 1 spray into both nostrils daily as needed for rhinitis.  furosemide (LASIX) 20 MG tablet Take 1 tablet (20 mg total) by mouth daily as needed. Take daily as needed for swelling 30 tablet 1   Ginger, Zingiber officinalis, (GINGER PO) Crystal Ginger-Taking as needed for Reflux- chews 3 pieces     loratadine (CLARITIN) 10 MG tablet Take 10 mg by mouth daily as needed for allergies. Reported on 09/20/2015     LUMIGAN 0.01 % SOLN Place 1 drop into both eyes at bedtime.     MAGNESIUM PO Take 400 mg by mouth daily.     METAMUCIL FIBER PO Taking  rwo capsules by mouth daily     Multiple Vitamin (MULTIVITAMIN) tablet Take 1 tablet by mouth daily.      Omega-3 Fatty Acids (FISH OIL) 1200 MG CAPS Take 1,200 mg by mouth daily.      No current facility-administered medications for this visit.   PHYSICAL EXAMINATION: Performance status (ECOG): 1 - Symptomatic but completely ambulatory  There were no vitals filed for this visit.  Wt Readings from Last 3 Encounters:  06/26/23 205 lb 6.4 oz (93.2 kg)  07/19/22 204 lb 9.6 oz (92.8 kg)  06/26/22 207 lb (93.9 kg)   Physical Exam Constitutional:      Appearance: Normal appearance.  Cardiovascular:     Rate and Rhythm: Normal rate and regular rhythm.  Pulmonary:     Effort: Pulmonary effort is normal.     Breath sounds: Normal breath sounds.  Chest:     Chest wall: No mass.  Breasts:    Right: Skin change present.     Left: Skin change present.     Comments: Right: Skin change (thickening at innner third of lumpetomy scar) present. Left: No skin change (UOQ lumpectomy scar is stable).     Abdominal:     General: Bowel sounds are normal.     Palpations: Abdomen is soft.  Musculoskeletal:        General: No swelling. Normal range of motion.  Skin:    Findings: Rash present. Rash is macular.     Comments: To forehead/scalp-asymmetrical and new per patient.  Neurological:     Mental Status: She is alert and oriented to person, place, and time. Mental status is at baseline.      LABORATORY DATA:  I have reviewed the data as listed    Latest Ref Rng & Units 07/25/2023    2:03 PM 07/12/2022    1:38 PM 07/06/2021    1:10 PM  CMP  Glucose 70 - 99 mg/dL 93  161  096   BUN 8 - 23 mg/dL 12  13  10    Creatinine 0.44 - 1.00 mg/dL 0.45  4.09  8.11   Sodium 135 - 145 mmol/L 133  135  133   Potassium 3.5 - 5.1 mmol/L 4.4  4.3  4.2   Chloride 98 - 111 mmol/L 102  104  103   CO2 22 - 32 mmol/L 22  23  21    Calcium 8.9 - 10.3 mg/dL 9.0  8.8  8.9   Total Protein 6.5 - 8.1 g/dL 7.1   7.1  7.4   Total Bilirubin <1.2 mg/dL 0.3  0.4  0.7   Alkaline Phos 38 - 126 U/L 70  70  83   AST 15 - 41 U/L 23  27  25    ALT 0 - 44 U/L 21  21  26     No results found for: "[id number]" Lab Results  Component Value Date   WBC  9.5 07/25/2023   HGB 14.4 07/25/2023   HCT 42.1 07/25/2023   MCV 90.5 07/25/2023   PLT 216 07/25/2023   NEUTROABS 5.2 07/25/2023    ASSESSMENT:  1.  Right breast infiltrating lobular carcinoma: - Status post right lumpectomy and lymph node biopsy on 04/14/2009, pT1b, PN 0, ER/PR positive, Ki-67 8%, margins negative, grade 1, 0.6 cm. -Patient was recommended tamoxifen which she took for approximately 4 years.  This was discontinued secondary to endometrial hyperplasia. - Breast cancer index testing shows high likelihood of benefit. -She was subsequently switched to anastrozole and could not tolerate secondary to musculoskeletal symptoms. -Mammogram on 06/08/2020 was BI-RADS Category 1. -CT abdomen on 07/12/2020 did not show any evidence of fatty liver or meta stasis. - Femara completed in October 2021.   2.  Left breast IDC: -She had a left breast lumpectomy and lymph node biopsy on 04/26/2010.  Pathology showed 1.7 cm, grade 1, with low-grade DCIS, margins negative.  PT1CPN0.  Ki-67 was 9%.  ER/PR positive and HER-2 negative. -Bilateral lumpectomy sites within normal limits.  Bilateral breast has no palpable masses.   3.  Osteopenia: - DEXA scan on 11/27/2017 shows T score of -1.9.  This has improved from a T score of -2.3 on 11/22/2016. -She was offered Prolia but she refused it given the side effects.   PLAN:  1.  Right breast ILC and left breast IDC: - Completed 10 years of antiestrogen therapy in 2021. - Physical exam shows right breast upper outer quadrant lumpectomy scar with thickness in the middle is stable.  Left upper outer scar is stable.  No palpable mass.  No palpable adenopathy. - Reviewed labs from 07/25/2023 with normal LFTs.  CBC was grossly  normal. - Mammogram on 07/19/2023 was BI-RADS Category 2. - Recommend follow-up in 1 year with repeat mammogram and labs.   2.  Osteopenia: - Vitamin D level is 45.19.  Continue vitamin D supplements.  3.  Skin lesion: -Reports several spots she is concerned with and would like to be evaluated by dermatology.  Has never been seen by dermatology in the past.  Would like referral to somebody locally.  PLAN SUMMARY: >> Dermatology referral >> Return to clinic in 1 year for follow-up with labs a few days before and mammogram.  Mammogram orders placed.     Breast Cancer therapy associated bone loss: I have recommended calcium, Vitamin D and weight bearing exercises.  Orders placed this encounter:  No orders of the defined types were placed in this encounter.  I spent 25 minutes dedicated to the care of this patient (face-to-face and non-face-to-face) on the date of the encounter to include what is described in the assessment and plan.  Durenda Hurt, NP 08/01/2023 12:36 PM

## 2023-09-02 DIAGNOSIS — D2261 Melanocytic nevi of right upper limb, including shoulder: Secondary | ICD-10-CM | POA: Diagnosis not present

## 2023-09-02 DIAGNOSIS — Z1283 Encounter for screening for malignant neoplasm of skin: Secondary | ICD-10-CM | POA: Diagnosis not present

## 2023-09-02 DIAGNOSIS — D485 Neoplasm of uncertain behavior of skin: Secondary | ICD-10-CM | POA: Diagnosis not present

## 2023-09-02 DIAGNOSIS — L814 Other melanin hyperpigmentation: Secondary | ICD-10-CM | POA: Diagnosis not present

## 2023-09-02 DIAGNOSIS — I872 Venous insufficiency (chronic) (peripheral): Secondary | ICD-10-CM | POA: Diagnosis not present

## 2023-09-02 DIAGNOSIS — D225 Melanocytic nevi of trunk: Secondary | ICD-10-CM | POA: Diagnosis not present

## 2023-09-09 ENCOUNTER — Telehealth (INDEPENDENT_AMBULATORY_CARE_PROVIDER_SITE_OTHER): Payer: Self-pay | Admitting: Gastroenterology

## 2023-09-09 NOTE — Telephone Encounter (Signed)
Ahmed, Juanetta Beets, MD  Marlowe Shores, LPN Hi Kenney Houseman,  Can you please schedule a Colonoscopy? Dx: Blood per rectum, family history of colon cancer . Room: 1/2  Thanks,  Vista Lawman, MD Gastroenterology and Hepatology [facility] Gastroenterology

## 2023-09-13 NOTE — Telephone Encounter (Signed)
Hey Dr.Ahmed I just to know what provider recommended to do this in case patient asks. Usually if a patient is having blood per rectum, we would need a referral from PCP or ER and then we schedule an office visit. Thank you!

## 2023-09-16 ENCOUNTER — Telehealth (INDEPENDENT_AMBULATORY_CARE_PROVIDER_SITE_OTHER): Payer: Self-pay | Admitting: Gastroenterology

## 2023-09-16 MED ORDER — NA SULFATE-K SULFATE-MG SULF 17.5-3.13-1.6 GM/177ML PO SOLN: ORAL | 0 refills | Status: AC

## 2023-09-16 NOTE — Telephone Encounter (Signed)
Pt contacted and scheduled for 10/07/23. Pt states she has a large amount of blood at times. Advised pt that we typically ask for a referral and then an office visit but since provider asked we will get her scheduled. Instructions mailed to patient. Pt requesting smaller volume prep. Will send suprep in for pt. Clearance sent to Dr.Fenton to hold Eliquis. Pt states she has Medicare and BCBS supplement

## 2023-09-16 NOTE — Telephone Encounter (Signed)
    09/16/23  JERYL SCIULLO 03/21/1946  What type of surgery is being performed? COLONOSCOPY  When is surgery scheduled? 10/07/23  CLEARANCE TO HOLD ELIQUIS FOR 2 DAYS PRIOR   Name of physician performing surgery?  Dr. Starleen Arms Gastroenterology at [facility] Phone: [phone] Fax: [phone]  Anethesia type (none, local, MAC, general)? MAC

## 2023-09-16 NOTE — Addendum Note (Signed)
Addended by: Marlowe Shores on: 09/16/2023 01:29 PM   Modules accepted: Orders

## 2023-09-16 NOTE — Telephone Encounter (Signed)
Patient was a family member who was physically present in the hospital to requested colonoscopy and is Dr Cathie Beams old patient

## 2023-09-17 NOTE — Telephone Encounter (Signed)
 Patient with diagnosis of atrial fibrillation on Eliquis  for anticoagulation.    What type of surgery is being performed? COLONOSCOPY   When is surgery scheduled? 10/07/23   CHA2DS2-VASc Score = 4   This indicates a 4.8% annual risk of stroke. The patient's score is based upon: CHF History: 0 HTN History: 1 Diabetes History: 0 Stroke History: 0 Vascular Disease History: 0 Age Score: 2 Gender Score: 1    CrCl 111 Platelet count 216  Per office protocol, patient can hold Eliquis  for 2 days prior to procedure.   Patient will not need bridging with Lovenox  (enoxaparin ) around procedure.  **This guidance is not considered finalized until pre-operative APP has relayed final recommendations.**

## 2023-09-19 NOTE — Telephone Encounter (Signed)
 Primary Cardiologist:None   Preoperative team, please contact this patient and set up a phone call appointment for further preoperative risk assessment. Please obtain consent and complete medication review. Thank you for your help.   Per office protocol, patient can hold Eliquis  for 2 days prior to procedure.   Patient will not need bridging with Lovenox  (enoxaparin ) around procedure.  I also confirmed the patient resides in the state of  . As per [facility] [employer] telemedicine laws, the patient must reside in the state in which the provider is licensed.   Heidi EMERSON Bane, NP-C  09/19/2023, 11:39 AM [address] Office [phone] Fax [phone]

## 2023-09-19 NOTE — Telephone Encounter (Signed)
 Left message to schedule tele pre op appt.

## 2023-09-20 ENCOUNTER — Telehealth: Payer: Self-pay

## 2023-09-20 NOTE — Telephone Encounter (Signed)
  Patient Consent for Virtual Visit        Heidi Lopez has provided verbal consent on 09/20/2023 for a virtual visit (video or telephone).   CONSENT FOR VIRTUAL VISIT FOR:  Heidi Lopez  By participating in this virtual visit I agree to the following:  I hereby voluntarily request, consent and authorize [facility] and its employed or contracted physicians, physician assistants, nurse practitioners or other licensed health care professionals (the Practitioner), to provide me with telemedicine health care services (the "Services) as deemed necessary by the treating Practitioner. I acknowledge and consent to receive the Services by the Practitioner via telemedicine. I understand that the telemedicine visit will involve communicating with the Practitioner through live audiovisual communication technology and the disclosure of certain medical information by electronic transmission. I acknowledge that I have been given the opportunity to request an in-person assessment or other available alternative prior to the telemedicine visit and am voluntarily participating in the telemedicine visit.  I understand that I have the right to withhold or withdraw my consent to the use of telemedicine in the course of my care at any time, without affecting my right to future care or treatment, and that the Practitioner or I may terminate the telemedicine visit at any time. I understand that I have the right to inspect all information obtained and/or recorded in the course of the telemedicine visit and may receive copies of available information for a reasonable fee.  I understand that some of the potential risks of receiving the Services via telemedicine include:  Delay or interruption in medical evaluation due to technological equipment failure or disruption; Information transmitted may not be sufficient (e.g. poor resolution of images) to allow for appropriate medical decision making by the Practitioner;  and/or  In rare instances, security protocols could fail, causing a breach of personal health information.  Furthermore, I acknowledge that it is my responsibility to provide information about my medical history, conditions and care that is complete and accurate to the best of my ability. I acknowledge that Practitioner's advice, recommendations, and/or decision may be based on factors not within their control, such as incomplete or inaccurate data provided by me or distortions of diagnostic images or specimens that may result from electronic transmissions. I understand that the practice of medicine is not an exact science and that Practitioner makes no warranties or guarantees regarding treatment outcomes. I acknowledge that a copy of this consent can be made available to me via my patient portal [facility] MyChart), or I can request a printed copy by calling the office of [facility].    I understand that my insurance will be billed for this visit.   I have read or had this consent read to me. I understand the contents of this consent, which adequately explains the benefits and risks of the Services being provided via telemedicine.  I have been provided ample opportunity to ask questions regarding this consent and the Services and have had my questions answered to my satisfaction. I give my informed consent for the services to be provided through the use of telemedicine in my medical care

## 2023-09-20 NOTE — Telephone Encounter (Signed)
 Preop clearance tele visit now scheduled. Med rec and consent done

## 2023-09-20 NOTE — Telephone Encounter (Signed)
 2nd attempt to reach pt regarding surgical clearance and the need for an TELE appointment.  Left pt a detailed message to call back and get that scheduled.

## 2023-09-27 ENCOUNTER — Other Ambulatory Visit (HOSPITAL_COMMUNITY): Payer: Self-pay | Admitting: Physician Assistant

## 2023-10-01 NOTE — Progress Notes (Signed)
 Virtual Visit via Telephone Note   Because of Heidi Lopez's co-morbid illnesses, she is at least at moderate risk for complications without adequate follow up.  This format is felt to be most appropriate for this patient at this time.  The patient did not have access to video technology/had technical difficulties with video requiring transitioning to audio format only (telephone).  All issues noted in this document were discussed and addressed.  No physical exam could be performed with this format.  Please refer to the patient's chart for her consent to telehealth for [facility].  Evaluation Performed:  Preoperative cardiovascular risk assessment _____________   Date:  10/02/2023   Patient ID:  Heidi Lopez, DOB 1946/06/02, MRN [id number] Patient Location:  Home Provider location:   Office  Primary Care Provider:  Artemisa Bile, MD Primary Cardiologist:  Formerly Dr. Nunzio Belch (Seen last by Valeda Garter, PA)  Chief Complaint / Patient Profile   78 y.o. y/o female with a h/o atrial fibrillation, and breast cancer   She is pending colonoscopy and is requested to have recommendations for holding Eliquis  for 2 days by Dr. Alita Irwin for colonoscopy scheduled on 10/07/2023 and presents today for telephonic preoperative cardiovascular risk assessment.  History of Present Illness    Heidi Lopez is a 78 y.o. female who presents via audio/video conferencing for a telehealth visit today.  Pt was last seen in cardiology clinic on 06/26/2023,  by Valeda Garter, PA.  At that time Heidi Lopez was doing well she was continued on Eliquis  and diltiazem , does not tolerate beta-blockers.  The patient is now pending procedure as outlined above. Since her last visit, she has been doing well.  Remains physically active walking a lot, able to go shopping for several hours without being fatigued.  She states that she is not having any bleeding or palpitations or shortness of breath.  She has been stable  from a cardiac standpoint and has been monitoring her blood pressure.  It has stayed within normal limits.  Past Medical History    Past Medical History:  Diagnosis Date   Breast cancer (HCC)    Cancer of breast (HCC) 03/2009   s/p XRT 2010 and again in 2011   Chronic coughing    Cramps, muscle, general    severe breast cramping    Glaucoma    Glaucoma    left eye    Joint pain    foot and knee pain   Lymphedema of arm    left   Night sweats    Osteopenia 01/24/2015   Pain    breast pain   Persistent atrial fibrillation (HCC) 05/2014   Personal history of radiation therapy    right and left breast   Sinus problem    Wears glasses    Past Surgical History:  Procedure Laterality Date   BREAST LUMPECTOMY Right 2010   BREAST LUMPECTOMY Left 2011   BREAST SURGERY  2010- right and 2011-left   for cancer   CARDIOVERSION N/A 10/21/2014   Procedure: CARDIOVERSION;  Surgeon: Liza Riggers, MD;  Location: [facility] ENDOSCOPY;  Service: Cardiovascular;  Laterality: N/A;   COLONOSCOPY N/A 02/05/2013   Procedure: COLONOSCOPY;  Surgeon: Ruby Corporal, MD;  Location: [facility] ENDO SUITE;  Service: Endoscopy;  Laterality: N/A;  830   COLONOSCOPY N/A 09/24/2019   Procedure: COLONOSCOPY;  Surgeon: Ruby Corporal, MD;  Location: [facility] ENDO SUITE;  Service: Endoscopy;  Laterality: N/A;  100pm   ELECTROPHYSIOLOGIC  STUDY N/A 02/24/2015   PVI and CTI ablation by Dr Nunzio Belch   EP IMPLANTABLE DEVICE N/A 08/24/2015   Procedure: Loop Recorder Insertion;  Surgeon: Jolly Needle, MD;  Location: [facility] INVASIVE CV LAB;  Service: Cardiovascular;  Laterality: N/A;   HYSTEROSCOPY WITH D & C N/A 12/29/2014   Procedure: DILATATION AND CURETTAGE /HYSTEROSCOPY;  Surgeon: Wendelyn Halter, MD;  Location: [facility] ORS;  Service: Gynecology;  Laterality: N/A;   Implantable loop recorder removal  04/29/2019   MDT Reveal LINQ removed by Dr Nunzio Belch in office   TEE WITHOUT CARDIOVERSION N/A 02/24/2015   Procedure: TRANSESOPHAGEAL  ECHOCARDIOGRAM (TEE);  Surgeon: Elmyra Haggard, MD;  Location: [facility] ENDOSCOPY;  Service: Cardiovascular;  Laterality: N/A;   TUBAL LIGATION  1984    Allergies  Allergies  Allergen Reactions   Metoprolol      Makes her feel bad   Vesicare  [Solifenacin ]     Severe hypertension and tachycardia    Reglan [Metoclopramide] Other (See Comments)    Causes severe jitters    Home Medications    Prior to Admission medications   Medication Sig Start Date End Date Taking? Authorizing Provider  ACETAMINOPHEN  PO Take 500 mg by mouth as needed.    [provider]  albuterol  (PROVENTIL  HFA;VENTOLIN  HFA) 108 (90 BASE) MCG/ACT inhaler Inhale into the lungs every 6 (six) hours as needed for wheezing or shortness of breath. Reported on 11/28/2015    [provider]  amoxicillin (AMOXIL) 500 MG capsule Takes for her dental procedures. 10/05/21   [provider]  brimonidine  (ALPHAGAN ) 0.2 % ophthalmic solution Place 1 drop into the left eye 2 (two) times daily.  06/18/14   [provider]  Calcium Carbonate-Vit D-Min (CALTRATE 600+D PLUS PO) Take 1 tablet by mouth daily.    [provider]  Cholecalciferol (VITAMIN D  PO) Take 2,000 Units by mouth daily.    [provider]  Coenzyme Q10 (COQ-10) 100 MG CAPS Take 200 mg by mouth daily.    [provider]  diltiazem  (CARDIZEM  CD) 180 MG 24 hr capsule Take 1 capsule (180 mg total) by mouth 2 (two) times daily. 03/28/23   Fenton, Clint R, PA  diltiazem  (CARDIZEM ) 30 MG tablet TAKE ONE TABLET BY MOUTH EVERY 4 HOURS AS NEEDED FOR HEART RATE >100 AS LONG AS BLOOD PRESSURE >100 01/05/19   Asa Lauth, NP  ELIQUIS  5 MG TABS tablet Take 1 tablet by mouth twice daily 09/27/23   Fenton, Clint R, PA  fluticasone (FLONASE) 50 MCG/ACT nasal spray Place 1 spray into both nostrils daily as needed for rhinitis.     [provider]  furosemide  (LASIX ) 20 MG tablet Take 1 tablet (20 mg total) by mouth daily as  needed. Take daily as needed for swelling 02/08/21   Asa Lauth, NP  Ginger, Zingiber officinalis, (GINGER PO) Crystal Ginger-Taking as needed for Reflux- chews 3 pieces    [provider]  loratadine  (CLARITIN ) 10 MG tablet Take 10 mg by mouth daily as needed for allergies. Reported on 09/20/2015    [provider]  LUMIGAN  0.01 % SOLN Place 1 drop into both eyes at bedtime. 06/18/14   [provider]  MAGNESIUM  PO Take 400 mg by mouth daily.    [provider]  METAMUCIL FIBER PO Taking rwo capsules by mouth daily    [provider]  Multiple Vitamin (MULTIVITAMIN) tablet Take 1 tablet by mouth daily.     [provider]  Na  Sulfate-K Sulfate-Mg Sulf 17.5-3.13-1.6 GM/177ML SOLN Use as directed 09/16/23   Ahmed, Muhammad F, MD  Omega-3 Fatty Acids (FISH OIL) 1200 MG CAPS Take 1,200 mg by mouth daily.     [provider]    Physical Exam    Vital Signs:  Heidi Lopez does not have vital signs available for review today. BP: 105/65 HR: 88  Given telephonic nature of communication, physical exam is limited. AAOx3. NAD. Normal affect.  Speech and respirations are unlabored.  Accessory Clinical Findings    None  Assessment & Plan    1.  Preoperative Cardiovascular Risk Assessment:  According to the Revised Cardiac Risk Index (RCRI), her Perioperative Risk of Major Cardiac Event is (%): 0.4  Her Functional Capacity in METs is: 8.97 according to the Duke Activity Status Index (DASI).   The patient was advised that if she develops new symptoms prior to surgery to contact our office to arrange for a follow-up visit, and she verbalized understanding.  Per office protocol, patient can hold Eliquis  for 2 days prior to procedure.   Patient will not need bridging with Lovenox  (enoxaparin ) around procedure.  She verbalizes understanding of holding the medication for 2 days.  Dr. Alita Irwin will let her know when to  restart.  Therefore, based on ACC/AHA guidelines, patient would be at acceptable risk for the planned procedure without further cardiovascular testing. I will route this recommendation to the requesting party via Epic fax function.   A copy of this note will be routed to requesting surgeon.  Time:   Today, I have spent 10 minutes with the patient with telehealth technology discussing medical history, symptoms, and management plan.     Friddie Jetty, NP  10/02/2023, 2:00 PM

## 2023-10-02 ENCOUNTER — Ambulatory Visit: Payer: Medicare Other | Attending: Cardiovascular Disease

## 2023-10-02 DIAGNOSIS — Z01810 Encounter for preprocedural cardiovascular examination: Secondary | ICD-10-CM | POA: Diagnosis not present

## 2023-10-02 DIAGNOSIS — Z01818 Encounter for other preprocedural examination: Secondary | ICD-10-CM

## 2023-10-03 NOTE — Telephone Encounter (Signed)
Received fax stating pt can hold Eliquis for 2 days prior to procedure. Pt will not need bridging with lovenox around procedure.

## 2023-10-07 ENCOUNTER — Encounter (HOSPITAL_COMMUNITY)
Admission: RE | Disposition: A | Discharge status: Home / Self Care | Payer: Self-pay | Source: Home / Self Care | Attending: Gastroenterology

## 2023-10-07 ENCOUNTER — Encounter (HOSPITAL_COMMUNITY): Payer: Self-pay | Admitting: Gastroenterology

## 2023-10-07 ENCOUNTER — Ambulatory Visit (HOSPITAL_COMMUNITY)
Admission: RE | Admit: 2023-10-07 | Discharge: 2023-10-07 | Disposition: A | Discharge status: Home / Self Care | Payer: Medicare Other | Attending: Gastroenterology | Admitting: Gastroenterology

## 2023-10-07 ENCOUNTER — Other Ambulatory Visit: Payer: Self-pay

## 2023-10-07 ENCOUNTER — Ambulatory Visit (HOSPITAL_COMMUNITY): Payer: Self-pay | Admitting: Certified Registered Nurse Anesthetist

## 2023-10-07 ENCOUNTER — Ambulatory Visit (HOSPITAL_BASED_OUTPATIENT_CLINIC_OR_DEPARTMENT_OTHER): Payer: Medicare Other | Admitting: Certified Registered Nurse Anesthetist

## 2023-10-07 DIAGNOSIS — K621 Rectal polyp: Secondary | ICD-10-CM

## 2023-10-07 DIAGNOSIS — K5730 Diverticulosis of large intestine without perforation or abscess without bleeding: Secondary | ICD-10-CM

## 2023-10-07 DIAGNOSIS — Z8249 Family history of ischemic heart disease and other diseases of the circulatory system: Secondary | ICD-10-CM | POA: Insufficient documentation

## 2023-10-07 DIAGNOSIS — D128 Benign neoplasm of rectum: Secondary | ICD-10-CM | POA: Insufficient documentation

## 2023-10-07 DIAGNOSIS — I4819 Other persistent atrial fibrillation: Secondary | ICD-10-CM | POA: Insufficient documentation

## 2023-10-07 DIAGNOSIS — K921 Melena: Secondary | ICD-10-CM | POA: Diagnosis not present

## 2023-10-07 DIAGNOSIS — Z7901 Long term (current) use of anticoagulants: Secondary | ICD-10-CM | POA: Diagnosis not present

## 2023-10-07 DIAGNOSIS — I10 Essential (primary) hypertension: Secondary | ICD-10-CM | POA: Insufficient documentation

## 2023-10-07 DIAGNOSIS — Z1211 Encounter for screening for malignant neoplasm of colon: Secondary | ICD-10-CM | POA: Diagnosis not present

## 2023-10-07 DIAGNOSIS — Z87891 Personal history of nicotine dependence: Secondary | ICD-10-CM

## 2023-10-07 DIAGNOSIS — K644 Residual hemorrhoidal skin tags: Secondary | ICD-10-CM | POA: Diagnosis not present

## 2023-10-07 DIAGNOSIS — Z800 Family history of malignant neoplasm of digestive organs: Secondary | ICD-10-CM | POA: Insufficient documentation

## 2023-10-07 HISTORY — PX: SUBMUCOSAL LIFTING INJECTION: SHX6855

## 2023-10-07 HISTORY — PX: POLYPECTOMY: SHX5525

## 2023-10-07 HISTORY — PX: COLONOSCOPY WITH PROPOFOL: SHX5780

## 2023-10-07 LAB — HM COLONOSCOPY

## 2023-10-07 SURGERY — COLONOSCOPY WITH PROPOFOL
Anesthesia: General

## 2023-10-07 MED ORDER — PROPOFOL 10 MG/ML IV BOLUS
INTRAVENOUS | Status: DC | PRN
  Administered 2023-10-07: 50 mg via INTRAVENOUS

## 2023-10-07 MED ORDER — LACTATED RINGERS IV SOLN: INTRAVENOUS | Status: DC | PRN

## 2023-10-07 MED ORDER — LACTATED RINGERS IV SOLN: INTRAVENOUS | Status: DC

## 2023-10-07 MED ORDER — PROPOFOL 500 MG/50ML IV EMUL
INTRAVENOUS | Status: DC | PRN
  Administered 2023-10-07: 150 ug/kg/min via INTRAVENOUS

## 2023-10-07 MED ORDER — LIDOCAINE HCL (PF) 2 % IJ SOLN
INTRAMUSCULAR | Status: DC | PRN
Start: 2023-10-07 — End: 2023-10-07
  Administered 2023-10-07: 50 mg via INTRADERMAL

## 2023-10-07 NOTE — Discharge Instructions (Signed)

## 2023-10-07 NOTE — Op Note (Signed)
[facility] Patient Name: Heidi Lopez Procedure Date: 10/07/2023 1:45 PM MRN: [id number] Date of Birth: Jul 24, 1946 Attending MD: Sanjuan Dame , MD, [id number] CSN: [id number] Age: 78 Admit Type: Outpatient Procedure:                Colonoscopy Indications:              Family history of colon cancer in multiple                            first-degree relatives, Hematochezia Providers:                Sanjuan Dame, MD, Sheran Fava, Zena Amos Referring MD:              Medicines:                Monitored Anesthesia Care Complications:            No immediate complications. Estimated Blood Loss:     Estimated blood loss was minimal. Procedure:                Pre-Anesthesia Assessment:                           - Prior to the procedure, a History and Physical                            was performed, and patient medications and                            allergies were reviewed. The patient's tolerance of                            previous anesthesia was also reviewed. The risks                            and benefits of the procedure and the sedation                            options and risks were discussed with the patient.                            All questions were answered, and informed consent                            was obtained. Prior Anticoagulants: The patient has                            taken Eliquis (apixaban), last dose was 3 days                            prior to procedure. ASA Grade Assessment: II - A  patient with mild systemic disease. After reviewing                            the risks and benefits, the patient was deemed in                            satisfactory condition to undergo the procedure.                           After obtaining informed consent, the colonoscope                            was passed under direct vision. Throughout the                            procedure,  the patient's blood pressure, pulse, and                            oxygen saturations were monitored continuously. The                            [id number]) scope was introduced through the                            anus and advanced to the the cecum, identified by                            appendiceal orifice and ileocecal valve. The                            colonoscopy was performed without difficulty. The                            patient tolerated the procedure well. The quality                            of the bowel preparation was evaluated using the                            BBPS [facility] Bowel Preparation Scale) with scores                            of: Right Colon = 3, Transverse Colon = 3 and Left                            Colon = 3 (entire mucosa seen well with no residual                            staining, small fragments of stool or opaque                            liquid). The total BBPS score equals 9. The  ileocecal valve, appendiceal orifice, and rectum                            were photographed. Scope In: 2:10:34 PM Scope Out: 2:27:22 PM Scope Withdrawal Time: 0 hours 14 minutes 38 seconds  Total Procedure Duration: 0 hours 16 minutes 48 seconds  Findings:      The perianal exam findings include non-thrombosed external hemorrhoids.      A 15 mm polyp was found in the rectum. The polyp was sessile.       Preparations were made for mucosal resection. Demarcation of the lesion       was performed with high-definition white light and narrow band imaging       to clearly identify the boundaries of the lesion. Eleview was injected       to raise the lesion. Snare mucosal resection was performed. Resection       and retrieval were complete. Resected tissue margins were examined and       clear of polyp tissue.      A few diverticula were found in the left colon. Impression:               - Non-thrombosed external hemorrhoids vs  anal                            lesion found on perianal exam. This looks atypical                            and given high risk family history of colon cancer                            recommend evaluation by colorectal surgery                           - One 15 mm polyp in the rectum, removed with                            mucosal resection. Resected and retrieved.                           - Diverticulosis in the left colon.                           - Mucosal resection was performed. Resection and                            retrieval were complete. Moderate Sedation:      Per Anesthesia Care Recommendation:           - Patient has a contact number available for                            emergencies. The signs and symptoms of potential                            delayed complications were discussed with the  patient. Return to normal activities tomorrow.                            Written discharge instructions were provided to the                            patient.                           - Resume previous diet.                           - Continue present medications.                           - Await pathology results.                           - Repeat colonoscopy in 3 years for surveillance                            based on pathology results.                           - Refer to a colo-rectal surgeon at appointment to                            be scheduled. Procedure Code(s):        --- Professional ---                           [id number], Colonoscopy, flexible; with endoscopic                            mucosal resection Diagnosis Code(s):        --- Professional ---                           D12.8, Benign neoplasm of rectum                           K64.4, Residual hemorrhoidal skin tags                           Z80.0, Family history of malignant neoplasm of                            digestive organs                           K92.1, Melena  (includes Hematochezia)                           K57.30, Diverticulosis of large intestine without                            perforation or abscess without bleeding CPT copyright 2022 American Medical Association. All rights reserved. The  codes documented in this report are preliminary and upon coder review may  be revised to meet current compliance requirements. Sanjuan Dame, MD Sanjuan Dame, MD 10/07/2023 2:39:59 PM This report has been signed electronically. Number of Addenda: 0

## 2023-10-07 NOTE — H&P (Signed)
Primary Care Physician:  Carylon Perches, MD Primary Gastroenterologist:  Dr. Tasia Catchings  Pre-Procedure History & Physical: HPI:  Heidi Lopez is a 78 y.o. female is here for a colonoscopy for family history of colon cancer and new onset hematochezia    No abdominal pain or unintentional weight loss.  No change in bowel habits.  Overall feels well from a GI standpoint. Last colonoscopy 2021   2021:  The perianal and digital rectal examinations were normal.  The terminal ileum appeared normal.  Scattered diverticula were found in the sigmoid colon.  External hemorrhoids were found during retroflexion. The hemorrhoids were small.   Past Medical History:  Diagnosis Date   Breast cancer (HCC)    Cancer of breast (HCC) 03/2009   s/p XRT 2010 and again in 2011   Chronic coughing    Cramps, muscle, general    severe breast cramping    Glaucoma    Glaucoma    left eye    Joint pain    foot and knee pain   Lymphedema of arm    left   Night sweats    Osteopenia 01/24/2015   Pain    breast pain   Persistent atrial fibrillation (HCC) 05/2014   Personal history of radiation therapy    right and left breast   Sinus problem    Wears glasses     Past Surgical History:  Procedure Laterality Date   BREAST LUMPECTOMY Right 2010   BREAST LUMPECTOMY Left 2011   BREAST SURGERY  2010- right and 2011-left   for cancer   CARDIOVERSION N/A 10/21/2014   Procedure: CARDIOVERSION;  Surgeon: Lars Masson, MD;  Location: [facility] ENDOSCOPY;  Service: Cardiovascular;  Laterality: N/A;   COLONOSCOPY N/A 02/05/2013   Procedure: COLONOSCOPY;  Surgeon: Malissa Hippo, MD;  Location: [facility] ENDO SUITE;  Service: Endoscopy;  Laterality: N/A;  830   COLONOSCOPY N/A 09/24/2019   Procedure: COLONOSCOPY;  Surgeon: Malissa Hippo, MD;  Location: [facility] ENDO SUITE;  Service: Endoscopy;  Laterality: N/A;  100pm   ELECTROPHYSIOLOGIC STUDY N/A 02/24/2015   PVI and CTI ablation by Dr Johney Frame   EP IMPLANTABLE DEVICE N/A  08/24/2015   Procedure: Loop Recorder Insertion;  Surgeon: Hillis Range, MD;  Location: [facility] INVASIVE CV LAB;  Service: Cardiovascular;  Laterality: N/A;   HYSTEROSCOPY WITH D & C N/A 12/29/2014   Procedure: DILATATION AND CURETTAGE /HYSTEROSCOPY;  Surgeon: Lazaro Arms, MD;  Location: [facility] ORS;  Service: Gynecology;  Laterality: N/A;   Implantable loop recorder removal  04/29/2019   MDT Reveal LINQ removed by Dr Johney Frame in office   TEE WITHOUT CARDIOVERSION N/A 02/24/2015   Procedure: TRANSESOPHAGEAL ECHOCARDIOGRAM (TEE);  Surgeon: Pricilla Riffle, MD;  Location: [facility] ENDOSCOPY;  Service: Cardiovascular;  Laterality: N/A;   TUBAL LIGATION  1984    Prior to Admission medications   Medication Sig Start Date End Date Taking? Authorizing Provider  albuterol (PROVENTIL HFA;VENTOLIN HFA) 108 (90 BASE) MCG/ACT inhaler Inhale into the lungs every 6 (six) hours as needed for wheezing or shortness of breath. Reported on 11/28/2015   Yes [provider]  amoxicillin (AMOXIL) 500 MG capsule Takes for her dental procedures. 10/05/21  Yes [provider]  brimonidine (ALPHAGAN) 0.2 % ophthalmic solution Place 1 drop into the left eye 2 (two) times daily.  06/18/14  Yes [provider]  Calcium Carbonate-Vit D-Min (CALTRATE 600+D PLUS PO) Take 1 tablet by mouth daily.   Yes [provider]  Cholecalciferol (  VITAMIN D PO) Take 2,000 Units by mouth daily.   Yes [provider]  Coenzyme Q10 (COQ-10) 100 MG CAPS Take 200 mg by mouth daily.   Yes [provider]  diltiazem (CARDIZEM CD) 180 MG 24 hr capsule Take 1 capsule (180 mg total) by mouth 2 (two) times daily. 03/28/23  Yes Fenton, Clint R, PA  diltiazem (CARDIZEM) 30 MG tablet TAKE ONE TABLET BY MOUTH EVERY 4 HOURS AS NEEDED FOR HEART RATE >100 AS LONG AS BLOOD PRESSURE >100 01/05/19  Yes Newman Nip, NP  fluticasone (FLONASE) 50 MCG/ACT nasal spray Place 1 spray into both nostrils daily as needed for rhinitis.     Yes [provider]  furosemide (LASIX) 20 MG tablet Take 1 tablet (20 mg total) by mouth daily as needed. Take daily as needed for swelling 02/08/21  Yes Newman Nip, NP  Ginger, Zingiber officinalis, (GINGER PO) Crystal Ginger-Taking as needed for Reflux- chews 3 pieces   Yes [provider]  loratadine (CLARITIN) 10 MG tablet Take 10 mg by mouth daily as needed for allergies. Reported on 09/20/2015   Yes [provider]  LUMIGAN 0.01 % SOLN Place 1 drop into both eyes at bedtime. 06/18/14  Yes [provider]  MAGNESIUM PO Take 400 mg by mouth daily.   Yes [provider]  METAMUCIL FIBER PO Taking rwo capsules by mouth daily   Yes [provider]  Multiple Vitamin (MULTIVITAMIN) tablet Take 1 tablet by mouth daily.    Yes [provider]  Na Sulfate-K Sulfate-Mg Sulf 17.5-3.13-1.6 GM/177ML SOLN Use as directed 09/16/23  Yes Shomari Scicchitano, Juanetta Beets, MD  Omega-3 Fatty Acids (FISH OIL) 1200 MG CAPS Take 1,200 mg by mouth daily.    Yes [provider]  ACETAMINOPHEN PO Take 500 mg by mouth as needed.    [provider]  ELIQUIS 5 MG TABS tablet Take 1 tablet by mouth twice daily 09/27/23   Fenton, [place] R, PA    Allergies as of 09/16/2023 - Review Complete 08/01/2023  Allergen Reaction Noted   Metoprolol  10/18/2014   Vesicare [solifenacin]  06/17/2017   Reglan [metoclopramide] Other (See Comments) 05/26/2012    Family History  Problem Relation Age of Onset   Heart disease Mother    Stroke Father    Cancer Father    Colon cancer Father    Breast cancer Sister    Cancer Sister    Breast cancer Sister    Cancer Sister    Cancer Sister    Diabetes Daughter    Early death Brother    Heart disease Son    Colon polyps Son     Social History   Socioeconomic History   Marital status: Widowed    Spouse name: Not on file   Number of children: Not on file   Years of education: Not on file   Highest  education level: Not on file  Occupational History   Not on file  Tobacco Use   Smoking status: Former    Current packs/day: 0.00    Average packs/day: 1 pack/day for 4.2 years (4.2 ttl pk-yrs)    Types: Cigarettes    Start date: 09/17/1962    Quit date: 11/16/1966    Years since quitting: 56.9   Smokeless tobacco: Never  Vaping Use   Vaping status: Never Used  Substance and Sexual Activity   Alcohol use: No    Alcohol/week: 0.0 standard drinks of alcohol   Drug  use: No   Sexual activity: Not Currently    Birth control/protection: Post-menopausal  Other Topics Concern   Not on file  Social History Narrative   Lives alone in [place]   Retired   Worked previously for [employer] and [employer]   Social Drivers of Corporate investment banker Strain: Not on file  Food Insecurity: Not on file  Transportation Needs: Not on file  Physical Activity: Not on file  Stress: Not on file  Social Connections: Not on file  Intimate Partner Violence: Not on file    Review of Systems: See HPI, otherwise negative ROS  Physical Exam: Vital signs in last 24 hours: Temp:  [97.7 F (36.5 C)] 97.7 F (36.5 C) (01/20 1250) Pulse Rate:  [95] 95 (01/20 1250) Resp:  [15] 15 (01/20 1250) BP: (143)/(68) 143/68 (01/20 1250) SpO2:  [95 %] 95 % (01/20 1250) Weight:  [92.5 kg] 92.5 kg (01/20 1250)   General:   Alert,  Well-developed, well-nourished, pleasant and cooperative in NAD Head:  Normocephalic and atraumatic. Eyes:  Sclera clear, no icterus.   Conjunctiva pink. Ears:  Normal auditory acuity. Nose:  No deformity, discharge,  or lesions. Msk:  Symmetrical without gross deformities. Normal posture. Extremities:  Without clubbing or edema. Neurologic:  Alert and  oriented x4;  grossly normal neurologically. Skin:  Intact without significant lesions or rashes. Psych:  Alert and cooperative. Normal mood and affect.  Impression/Plan:  Heidi Lopez is a 78 y.o. female is here for a  colonoscopy for family history of colon cancer and new onset hematochezia     The risks of the procedure including infection, bleed, or perforation as well as benefits, limitations, alternatives and imponderables have been reviewed with the patient. Questions have been answered. All parties agreeable.

## 2023-10-07 NOTE — Transfer of Care (Signed)
Immediate Anesthesia Transfer of Care Note  Patient: Heidi Lopez  Procedure(s) Performed: COLONOSCOPY WITH PROPOFOL POLYPECTOMY SUBMUCOSAL LIFTING INJECTION  Patient Location: Short Stay  Anesthesia Type:General  Level of Consciousness: awake, alert , oriented, and patient cooperative  Airway & Oxygen Therapy: Patient Spontanous Breathing  Post-op Assessment: Report given to RN, Post -op Vital signs reviewed and stable, and Patient moving all extremities X 4  Post vital signs: Reviewed and stable  Last Vitals:  Vitals Value Taken Time  BP 106/74 10/07/23 1433  Temp    Pulse 88 10/07/23 1433  Resp 22 10/07/23 1433  SpO2 98 % 10/07/23 1433    Last Pain:  Vitals:   10/07/23 1433  TempSrc:   PainSc: 0-No pain         Complications: No notable events documented.

## 2023-10-07 NOTE — Anesthesia Procedure Notes (Signed)
Date/Time: 10/07/2023 1:58 PM  Performed by: Julian Reil, CRNAPre-anesthesia Checklist: Patient identified, Emergency Drugs available, Suction available and Patient being monitored Patient Re-evaluated:Patient Re-evaluated prior to induction Oxygen Delivery Method: Nasal cannula Induction Type: IV induction Placement Confirmation: positive ETCO2

## 2023-10-07 NOTE — Anesthesia Preprocedure Evaluation (Signed)
Anesthesia Evaluation  Patient identified by MRN, date of birth, ID band Patient awake    Reviewed: Allergy & Precautions, H&P , NPO status , Patient's Chart, lab work & pertinent test results, reviewed documented beta blocker date and time   Airway Mallampati: II  TM Distance: >3 FB Neck ROM: full    Dental no notable dental hx.    Pulmonary neg pulmonary ROS, former smoker   Pulmonary exam normal breath sounds clear to auscultation       Cardiovascular Exercise Tolerance: Good hypertension, + dysrhythmias Atrial Fibrillation  Rhythm:regular Rate:Normal     Neuro/Psych negative neurological ROS  negative psych ROS   GI/Hepatic negative GI ROS, Neg liver ROS,,,  Endo/Other  negative endocrine ROS    Renal/GU negative Renal ROS  negative genitourinary   Musculoskeletal   Abdominal   Peds  Hematology negative hematology ROS (+)   Anesthesia Other Findings   Reproductive/Obstetrics negative OB ROS                             Anesthesia Physical Anesthesia Plan  ASA: 3  Anesthesia Plan: General   Post-op Pain Management:    Induction:   PONV Risk Score and Plan: Propofol infusion  Airway Management Planned:   Additional Equipment:   Intra-op Plan:   Post-operative Plan:   Informed Consent: I have reviewed the patients History and Physical, chart, labs and discussed the procedure including the risks, benefits and alternatives for the proposed anesthesia with the patient or authorized representative who has indicated his/her understanding and acceptance.     Dental Advisory Given  Plan Discussed with: CRNA  Anesthesia Plan Comments:        Anesthesia Quick Evaluation

## 2023-10-08 ENCOUNTER — Encounter (INDEPENDENT_AMBULATORY_CARE_PROVIDER_SITE_OTHER): Payer: Self-pay | Admitting: *Deleted

## 2023-10-08 ENCOUNTER — Telehealth (INDEPENDENT_AMBULATORY_CARE_PROVIDER_SITE_OTHER): Payer: Self-pay | Admitting: *Deleted

## 2023-10-08 ENCOUNTER — Encounter (HOSPITAL_COMMUNITY): Payer: Self-pay | Admitting: Gastroenterology

## 2023-10-08 NOTE — Telephone Encounter (Signed)
-----   Message from Franky Macho sent at 10/07/2023  2:40 PM EST ----- Regarding: Colorectal referral Hi Prentice Sackrider  Can you please refer this patient to colorectal surgery ,  Diagnosis : anal canal lesion , family history of colon cancer

## 2023-10-08 NOTE — Telephone Encounter (Signed)
Referral sent, they will contact patient with apt

## 2023-10-09 LAB — SURGICAL PATHOLOGY

## 2023-10-10 NOTE — Progress Notes (Signed)
I reviewed the pathology results. Ann, can you send her a letter with the findings as described below please? Repeat colonoscopy in 3 years  Thanks,  Vista Lawman, MD Gastroenterology and Hepatology [facility]  ---------------------------------------------------------------------------------------------  [facility] [address], [place], [place] [address] Phone:  [phone]   10/10/23 [place], [place]   Dear Heidi Lopez,  I am writing to inform you that the biopsies taken during your recent endoscopic examination showed:  I am writing to let you know the results of your recent colonoscopy.  You had a total of 1 polyp removed. The pathology came back as "tubular adenoma." These findings are NOT cancer, but had the polyps remained in your colon, they could have turned into cancer.  Given these findings, it is recommended that your next colonoscopy be performed in 3 years as the polyp was considered large .  Also I have referred you to the colorectal surgeon to be evaluated for the anal lesion I encountered , as this was discussed with you   Also I value your feedback , so if you get a survey , please take the time to fill it out and thank you for choosing [facility]  Please call us at [phone] if you have persistent problems or have questions about your condition that have not been fully answered at this time.  Sincerely,  Vista Lawman, MD Gastroenterology and Hepatology

## 2023-10-11 ENCOUNTER — Encounter (INDEPENDENT_AMBULATORY_CARE_PROVIDER_SITE_OTHER): Payer: Self-pay | Admitting: *Deleted

## 2023-10-11 NOTE — Anesthesia Postprocedure Evaluation (Signed)
Anesthesia Post Note  Patient: Heidi Lopez  Procedure(s) Performed: COLONOSCOPY WITH PROPOFOL POLYPECTOMY SUBMUCOSAL LIFTING INJECTION  Patient location during evaluation: Phase II Anesthesia Type: General Level of consciousness: awake Pain management: pain level controlled Vital Signs Assessment: post-procedure vital signs reviewed and stable Respiratory status: spontaneous breathing and respiratory function stable Cardiovascular status: blood pressure returned to baseline and stable Postop Assessment: no headache and no apparent nausea or vomiting Anesthetic complications: no Comments: Late entry   No notable events documented.   Last Vitals:  Vitals:   10/07/23 1250 10/07/23 1433  BP: (!) 143/68 106/74  Pulse: 95 88  Resp: 15 (!) 22  Temp: 36.5 C   SpO2: 95% 98%    Last Pain:  Vitals:   10/07/23 1433  TempSrc:   PainSc: 0-No pain                 Windell Norfolk

## 2023-10-20 NOTE — Progress Notes (Signed)
  Cardiology Office Note:  .   Date:  10/22/2023  ID:  Heidi Lopez, DOB Feb 14, 1946, MRN [id number] PCP: Sheryle Carwin, MD  [facility] Providers Cardiologist:  None Electrophysiologist:  Lynwood Rakers, MD (Inactive)    History of Present Illness: .    Feb. 4, 2025  Heidi Lopez is a 78 y.o. female with hx of atrial fib Previous patient of Dr. Lynwood Rakers Has been followed in the atrial fib clinic  She had a colonoscopy and has been cleared for the colonoscopy and cleared to hold Eliquis  for 2 days by Lamarr Satterfield , NP  on Jan. 15, 2025  She has some known polyps which were benign but she did have some sort of abnormality for which she is seeing a careers adviser in the next several weeks.  No Cp  Has GERD  Stays busy ,  walks on occasion   Still eats salt fairly often         ROS:   Studies Reviewed: .         Risk Assessment/Calculations:    CHA2DS2-VASc Score = 4   his indicates a 4.8% annual risk of stroke. The patient's score is based upon: CHF History: 0 HTN History: 1 Diabetes History: 0 Stroke History: 0 Vascular Disease History: 0 Age Score: 2 Gender Score: 1        Physical Exam:   VS:  BP 130/82   Pulse 96   Ht 5' 3 (1.6 m)   Wt 210 lb 6.4 oz (95.4 kg)   SpO2 95%   BMI 37.27 kg/m    Wt Readings from Last 3 Encounters:  10/22/23 210 lb 6.4 oz (95.4 kg)  10/07/23 204 lb (92.5 kg)  08/01/23 210 lb (95.3 kg)    GEN: Well nourished, well developed in no acute distress NECK: No JVD; No carotid bruits CARDIAC: irreg. Irreg.  RESPIRATORY:  Clear to auscultation without rales, wheezing or rhonchi  ABDOMEN: Soft, non-tender, non-distended EXTREMITIES:  No edema; No deformity   ASSESSMENT AND PLAN: .   1.  Chronic atrial fibrillation: She will be presents for further management of her chronic atrial fibrillation.  Her rate is well-controlled.  She is on Eliquis .  She has been seen by Dr. Rakers in the past.  Her rate is fine.  Will  continue current medications.       Dispo: 1 year    Signed, Aleene Passe, MD

## 2023-10-22 ENCOUNTER — Encounter: Payer: Self-pay | Admitting: Cardiovascular Disease

## 2023-10-22 ENCOUNTER — Ambulatory Visit: Payer: Medicare Other | Attending: Cardiovascular Disease | Admitting: Cardiovascular Disease

## 2023-10-22 VITALS — BP 130/82 | HR 96 | Ht 63.0 in | Wt 210.4 lb

## 2023-10-22 DIAGNOSIS — I4891 Unspecified atrial fibrillation: Secondary | ICD-10-CM

## 2023-10-22 NOTE — Patient Instructions (Signed)
 Follow-Up: At [facility], you and your health needs are our priority.  As part of our continuing mission to provide you with exceptional heart care, we have created designated Provider Care Teams.  These Care Teams include your primary Cardiologist (physician) and Advanced Practice Providers (APPs -  Physician Assistants and Nurse Practitioners) who all work together to provide you with the care you need, when you need it.  We recommend signing up for the patient portal called "MyChart".  Sign up information is provided on this After Visit Summary.  MyChart is used to connect with patients for Virtual Visits (Telemedicine).  Patients are able to view lab/test results, encounter notes, upcoming appointments, etc.  Non-urgent messages can be sent to your provider as well.   To learn more about what you can do with MyChart, go to [email or web address].    Your next appointment:   1 year(s)  Provider:   Provider  Other Instructions   1st Floor: - Lobby - Registration  - Pharmacy  - Lab - Cafe  2nd Floor: - PV Lab - Diagnostic Testing (echo, CT, nuclear med)  3rd Floor: - Vacant  4th Floor: - TCTS (cardiothoracic surgery) - AFib Clinic - Structural Heart Clinic - Vascular Surgery  - Vascular Ultrasound  5th Floor: - HeartCare Cardiology (general and EP) - Clinical Pharmacy for coumadin, hypertension, lipid, weight-loss medications, and med management appointments    Valet parking services will be available as well.

## 2023-11-11 ENCOUNTER — Ambulatory Visit: Payer: Self-pay | Admitting: Surgery

## 2023-11-12 ENCOUNTER — Telehealth (INDEPENDENT_AMBULATORY_CARE_PROVIDER_SITE_OTHER): Payer: Self-pay | Admitting: Gastroenterology

## 2023-11-12 NOTE — Telephone Encounter (Signed)
 Patient seen by colorectal surgeon for anal lesion   Dr Michaell Cowing

## 2023-12-24 ENCOUNTER — Other Ambulatory Visit (HOSPITAL_COMMUNITY): Payer: Self-pay | Admitting: Physician Assistant

## 2023-12-24 DIAGNOSIS — I4891 Unspecified atrial fibrillation: Secondary | ICD-10-CM

## 2023-12-30 NOTE — Telephone Encounter (Signed)
 Prescription refill request for Eliquis received. Indication: a fib Last office visit: 10/22/23 Scr: 0.74 epic (07/25/23) Age: 78 Weight: 95kg

## 2024-01-07 ENCOUNTER — Other Ambulatory Visit (HOSPITAL_COMMUNITY): Payer: Self-pay | Admitting: *Deleted

## 2024-01-07 MED ORDER — FUROSEMIDE 20 MG PO TABS: 20.0000 mg | ORAL_TABLET | Freq: Every day | ORAL | 1 refills | Status: DC | PRN

## 2024-03-24 ENCOUNTER — Other Ambulatory Visit (HOSPITAL_COMMUNITY): Payer: Self-pay | Admitting: Physician Assistant

## 2024-03-24 DIAGNOSIS — I4891 Unspecified atrial fibrillation: Secondary | ICD-10-CM

## 2024-03-25 NOTE — Telephone Encounter (Signed)
 Pt last saw Dr Alveta 10/22/23, last labs 07/25/23 Creat 0.74, age 78, weight 95.4kg, based on specified criteria pt is on appropriate dosage of Eliquis  5mg  BID for afib.  Will refill rx.

## 2024-04-28 ENCOUNTER — Other Ambulatory Visit: Payer: Self-pay

## 2024-04-28 MED ORDER — FUROSEMIDE 20 MG PO TABS: 20.0000 mg | ORAL_TABLET | Freq: Every day | ORAL | 1 refills | Status: AC

## 2024-07-20 ENCOUNTER — Ambulatory Visit
Admission: RE | Admit: 2024-07-20 | Discharge: 2024-07-20 | Disposition: A | Payer: BLUE CROSS/BLUE SHIELD | Source: Ambulatory Visit | Attending: Oncology | Admitting: Oncology

## 2024-07-20 DIAGNOSIS — Z853 Personal history of malignant neoplasm of breast: Secondary | ICD-10-CM

## 2024-07-20 DIAGNOSIS — Z1231 Encounter for screening mammogram for malignant neoplasm of breast: Secondary | ICD-10-CM

## 2024-07-22 ENCOUNTER — Other Ambulatory Visit: Payer: Self-pay

## 2024-07-22 DIAGNOSIS — Z1231 Encounter for screening mammogram for malignant neoplasm of breast: Secondary | ICD-10-CM

## 2024-07-22 DIAGNOSIS — E559 Vitamin D deficiency, unspecified: Secondary | ICD-10-CM

## 2024-07-22 DIAGNOSIS — Z853 Personal history of malignant neoplasm of breast: Secondary | ICD-10-CM

## 2024-07-23 ENCOUNTER — Inpatient Hospital Stay: Payer: BLUE CROSS/BLUE SHIELD | Attending: Oncology

## 2024-07-23 DIAGNOSIS — M8580 Other specified disorders of bone density and structure, unspecified site: Secondary | ICD-10-CM | POA: Insufficient documentation

## 2024-07-23 DIAGNOSIS — D509 Iron deficiency anemia, unspecified: Secondary | ICD-10-CM | POA: Insufficient documentation

## 2024-07-23 DIAGNOSIS — E559 Vitamin D deficiency, unspecified: Secondary | ICD-10-CM | POA: Insufficient documentation

## 2024-07-23 DIAGNOSIS — Z853 Personal history of malignant neoplasm of breast: Secondary | ICD-10-CM

## 2024-07-23 DIAGNOSIS — Z1231 Encounter for screening mammogram for malignant neoplasm of breast: Secondary | ICD-10-CM

## 2024-07-23 LAB — COMPREHENSIVE METABOLIC PANEL WITH GFR
ALT: 29 U/L (ref 0–44)
AST: 33 U/L (ref 15–41)
Albumin: 4.3 g/dL (ref 3.5–5.0)
Alkaline Phosphatase: 83 U/L (ref 38–126)
Anion gap: 13 (ref 5–15)
BUN: 13 mg/dL (ref 8–23)
CO2: 24 mmol/L (ref 22–32)
Calcium: 9.1 mg/dL (ref 8.9–10.3)
Chloride: 103 mmol/L (ref 98–111)
Creatinine, Ser: 0.9 mg/dL (ref 0.44–1.00)
GFR, Estimated: >60 mL/min (ref >60)
Glucose, Bld: 88 mg/dL (ref 70–99)
Potassium: 4.2 mmol/L (ref 3.5–5.1)
Sodium: 140 mmol/L (ref 135–145)
Total Bilirubin: 0.2 mg/dL (ref 0.0–1.2)
Total Protein: 6.9 g/dL (ref 6.5–8.1)

## 2024-07-23 LAB — CBC WITH DIFFERENTIAL/PLATELET
Abs Immature Granulocytes: 0.03 K/uL (ref 0.00–0.07)
Basophils Absolute: 0.1 K/uL (ref 0.0–0.1)
Basophils Relative: 1 %
Eosinophils Absolute: 0.2 K/uL (ref 0.0–0.5)
Eosinophils Relative: 2 %
HCT: 33.8 % — ABNORMAL LOW (ref 36.0–46.0)
Hemoglobin: 10.6 g/dL — ABNORMAL LOW (ref 12.0–15.0)
Immature Granulocytes: 0 %
Lymphocytes Relative: 28 %
Lymphs Abs: 3 K/uL (ref 0.7–4.0)
MCH: 27 pg (ref 26.0–34.0)
MCHC: 31.4 g/dL (ref 30.0–36.0)
MCV: 86 fL (ref 80.0–100.0)
Monocytes Absolute: 1.3 K/uL — ABNORMAL HIGH (ref 0.1–1.0)
Monocytes Relative: 12 %
Neutro Abs: 5.8 K/uL (ref 1.7–7.7)
Neutrophils Relative %: 57 %
Platelets: 271 K/uL (ref 150–400)
RBC: 3.93 MIL/uL (ref 3.87–5.11)
RDW: 12.9 % (ref 11.5–15.5)
WBC: 10.4 K/uL (ref 4.0–10.5)
nRBC: 0 % (ref 0.0–0.2)

## 2024-07-23 LAB — VITAMIN D 25 HYDROXY (VIT D DEFICIENCY, FRACTURES): Vit D, 25-Hydroxy: 50.41 ng/mL (ref 30–100)

## 2024-07-30 ENCOUNTER — Inpatient Hospital Stay: Payer: BLUE CROSS/BLUE SHIELD | Admitting: Oncology

## 2024-07-30 ENCOUNTER — Inpatient Hospital Stay

## 2024-07-30 ENCOUNTER — Ambulatory Visit: Payer: Self-pay | Admitting: Oncology

## 2024-07-30 ENCOUNTER — Telehealth: Payer: Self-pay | Admitting: *Deleted

## 2024-07-30 VITALS — BP 129/78 | HR 86 | Temp 97.6°F | Resp 16 | Wt 211.4 lb

## 2024-07-30 DIAGNOSIS — E559 Vitamin D deficiency, unspecified: Secondary | ICD-10-CM

## 2024-07-30 DIAGNOSIS — Z853 Personal history of malignant neoplasm of breast: Secondary | ICD-10-CM | POA: Diagnosis not present

## 2024-07-30 DIAGNOSIS — D649 Anemia, unspecified: Secondary | ICD-10-CM

## 2024-07-30 DIAGNOSIS — Z1231 Encounter for screening mammogram for malignant neoplasm of breast: Secondary | ICD-10-CM | POA: Diagnosis not present

## 2024-07-30 DIAGNOSIS — D509 Iron deficiency anemia, unspecified: Secondary | ICD-10-CM | POA: Diagnosis not present

## 2024-07-30 LAB — COMPREHENSIVE METABOLIC PANEL WITH GFR
ALT: 29 U/L (ref 0–44)
AST: 26 U/L (ref 15–41)
Albumin: 4.5 g/dL (ref 3.5–5.0)
Alkaline Phosphatase: 84 U/L (ref 38–126)
Anion gap: 12 (ref 5–15)
BUN: 14 mg/dL (ref 8–23)
CO2: 25 mmol/L (ref 22–32)
Calcium: 9.4 mg/dL (ref 8.9–10.3)
Chloride: 102 mmol/L (ref 98–111)
Creatinine, Ser: 0.86 mg/dL (ref 0.44–1.00)
GFR, Estimated: >60 mL/min (ref >60)
Glucose, Bld: 116 mg/dL — ABNORMAL HIGH (ref 70–99)
Potassium: 4.6 mmol/L (ref 3.5–5.1)
Sodium: 139 mmol/L (ref 135–145)
Total Bilirubin: 0.3 mg/dL (ref 0.0–1.2)
Total Protein: 7.2 g/dL (ref 6.5–8.1)

## 2024-07-30 LAB — CBC WITH DIFFERENTIAL/PLATELET
Abs Immature Granulocytes: 0.04 K/uL (ref 0.00–0.07)
Basophils Absolute: 0.1 K/uL (ref 0.0–0.1)
Basophils Relative: 1 %
Eosinophils Absolute: 0.2 K/uL (ref 0.0–0.5)
Eosinophils Relative: 3 %
HCT: 33.3 % — ABNORMAL LOW (ref 36.0–46.0)
Hemoglobin: 10.7 g/dL — ABNORMAL LOW (ref 12.0–15.0)
Immature Granulocytes: 1 %
Lymphocytes Relative: 22 %
Lymphs Abs: 1.9 K/uL (ref 0.7–4.0)
MCH: 27 pg (ref 26.0–34.0)
MCHC: 32.1 g/dL (ref 30.0–36.0)
MCV: 84.1 fL (ref 80.0–100.0)
Monocytes Absolute: 1.1 K/uL — ABNORMAL HIGH (ref 0.1–1.0)
Monocytes Relative: 12 %
Neutro Abs: 5.4 K/uL (ref 1.7–7.7)
Neutrophils Relative %: 61 %
Platelets: 294 K/uL (ref 150–400)
RBC: 3.96 MIL/uL (ref 3.87–5.11)
RDW: 13.2 % (ref 11.5–15.5)
WBC: 8.7 K/uL (ref 4.0–10.5)
nRBC: 0 % (ref 0.0–0.2)

## 2024-07-30 LAB — VITAMIN B12: Vitamin B-12: 736 pg/mL (ref 180–914)

## 2024-07-30 LAB — IRON AND TIBC
Iron: 43 ug/dL (ref 28–170)
Saturation Ratios: 9 % — ABNORMAL LOW (ref 10.4–31.8)
TIBC: 473 ug/dL — ABNORMAL HIGH (ref 250–450)
UIBC: 430 ug/dL

## 2024-07-30 LAB — VITAMIN D 25 HYDROXY (VIT D DEFICIENCY, FRACTURES): Vit D, 25-Hydroxy: 57.48 ng/mL (ref 30–100)

## 2024-07-30 LAB — FERRITIN: Ferritin: 13 ng/mL (ref 11–307)

## 2024-07-30 LAB — FOLATE: Folate: >20 ng/mL (ref >5.9)

## 2024-07-30 NOTE — Progress Notes (Signed)
 Patient aware and was scheduled as recommended.

## 2024-07-30 NOTE — Telephone Encounter (Signed)
 Per Delon Hope, NP patient needs Feraheme x 2 doses.  Patient aware and verbalized understanding.

## 2024-07-30 NOTE — Progress Notes (Signed)
 Noted to have anemia during her visit today.  Nutritional labs added on and show fairly significant iron deficiency.  Iron sats 9%.  Elevated TIBC.  Recommend 2 doses of IV Feraheme.  Delon Hope, NP 07/30/2024 2:55 PM

## 2024-07-30 NOTE — Progress Notes (Signed)
 [facility] [address] [place] [address]   Patient Care Team: Sheryle Carwin, MD as PCP - General (Internal Medicine) Kelsie Agent, MD (Inactive) as PCP - Electrophysiology (Cardiology) Keenan Hastings, MD as Consulting Physician (Radiation Oncology) Pernell Locus, MD as Consulting Physician (Hematology and Oncology) Ahmed, Deatrice FALCON, MD as Consulting Physician (Gastroenterology) Sheldon Standing, MD as Consulting Physician (General Surgery) Nahser, Aleene PARAS, MD (Inactive) as Consulting Physician (Cardiology)  SUMMARY OF ONCOLOGIC HISTORY: Oncology History   No history exists.    CHIEF COMPLIANT: Follow-up for bilateral breast cancer  INTERVAL HISTORY: [name] is a 78 y.o. female seen for follow-up of bilateral breast cancers.   Patient recently had colonoscopy with Dr. Cinderella and had 1 polyp removed.  Pathology revealed tubular adenoma.  Recommended repeat colonoscopy in 3 years.  She also was found to have an anal lesion and was referred to a colorectal surgeon.  She met with Dr. Sheldon on 11/11/2023 which concluded rectal bleeding with grade 3 and 4 hemorrhoids.  She likely would benefit from hemorrhoid ligation and/or hemorrhoidectomy.  Reports additional surgery is on hold at this time as she is feeling well and does not want to go undergo any additional surgery.  Reports she stopped breathing during her most recent colonoscopy and she knows that her hemorrhoid surgery would be waiting longer.  She continues to have hemorrhoidal bleeding averaging 1 time per month.  She is attempting to keep her stools soft to avoid straining.  She is planning on reaching out to her GI doctor to see if anything additional could be done that would not require her to be put under.  Has history of atrial fibrillation with occasional chest pain.  She is followed by Dr. Robby.  She is status post ablation in 2016 with persistent atrial fibrillation.  She recently had Zio patch  which confirmed A-fib burden of 100%.  She is currently stable on Eliquis .  Her rate is controlled.  Has occasional numbing and tingling in hands and feet.  Reports chronic cough and shortness of breath with exertion but otherwise gets around pretty well.  Has chest pain at times.  Has left foot swelling intermittently.  Have palpitations secondary to atrial fibrillation.  Has numbness and burning in her left foot.  Appetite is 100% energy levels are 75%.  REVIEW OF SYSTEMS:   Review of Systems  Respiratory:  Positive for cough and shortness of breath.   Cardiovascular:  Positive for chest pain and leg swelling.  Gastrointestinal:  Positive for blood in stool.  Neurological:  Positive for numbness.  Hematological:  Negative for adenopathy.  Psychiatric/Behavioral:  Positive for sleep disturbance.     I have reviewed the past medical history, past surgical history, social history and family history with the patient and they are unchanged from previous note.   ALLERGIES:   is allergic to metoprolol , vesicare  [solifenacin ], and reglan [metoclopramide].   MEDICATIONS:  Current Outpatient Medications  Medication Sig Dispense Refill   ACETAMINOPHEN  PO Take 500 mg by mouth as needed.     albuterol  (PROVENTIL  HFA;VENTOLIN  HFA) 108 (90 BASE) MCG/ACT inhaler Inhale into the lungs every 6 (six) hours as needed for wheezing or shortness of breath. Reported on 11/28/2015     amoxicillin (AMOXIL) 500 MG capsule Takes for her dental procedures.     apixaban  (ELIQUIS ) 5 MG TABS tablet Take 1 tablet by mouth twice daily 180 tablet 2   brimonidine  (ALPHAGAN ) 0.2 % ophthalmic solution  Place 1 drop into the left eye 2 (two) times daily.      Calcium Carbonate-Vit D-Min (CALTRATE 600+D PLUS PO) Take 1 tablet by mouth daily.     Cholecalciferol (VITAMIN D  PO) Take 2,000 Units by mouth daily.     Coenzyme Q10 (COQ-10) 100 MG CAPS Take 200 mg by mouth daily.     diltiazem  (CARDIZEM  CD) 180 MG 24 hr capsule  Take 1 capsule by mouth twice daily 180 capsule 3   diltiazem  (CARDIZEM ) 30 MG tablet TAKE ONE TABLET BY MOUTH EVERY 4 HOURS AS NEEDED FOR HEART RATE >100 AS LONG AS BLOOD PRESSURE >100 45 tablet 3   fluticasone (FLONASE) 50 MCG/ACT nasal spray Place 1 spray into both nostrils daily as needed for rhinitis.      furosemide  (LASIX ) 20 MG tablet Take 1 tablet (20 mg total) by mouth daily. AS NEEDED FOR SWELLING 90 tablet 1   Ginger, Zingiber officinalis, (GINGER PO) Crystal Ginger-Taking as needed for Reflux- chews 3 pieces     loratadine  (CLARITIN ) 10 MG tablet Take 10 mg by mouth daily as needed for allergies. Reported on 09/20/2015     LUMIGAN  0.01 % SOLN Place 1 drop into both eyes at bedtime.     MAGNESIUM  PO Take 400 mg by mouth daily.     METAMUCIL FIBER PO Taking rwo capsules by mouth daily     Multiple Vitamin (MULTIVITAMIN) tablet Take 1 tablet by mouth daily.      Na Sulfate-K Sulfate-Mg Sulf 17.5-3.13-1.6 GM/177ML SOLN Use as directed 354 mL 0   Omega-3 Fatty Acids (FISH OIL) 1200 MG CAPS Take 1,200 mg by mouth daily.      prednisoLONE acetate (PRED FORTE) 1 % ophthalmic suspension INSTILL 1 DROP INTO LEFT EYE 4 TIMES DAILY FOR 7 DAYS THEN THREE TIMES DAILY FOR 7 DAYS THEN TWICE DAILY FOR 7 DAYS THEN ONCE DAILY FOR 7 DAYS START AFTER EYE SURGERY     No current facility-administered medications for this visit.   PHYSICAL EXAMINATION: Performance status (ECOG): 1 - Symptomatic but completely ambulatory  Vitals:   07/30/24 0944  BP: 129/78  Pulse: 86  Resp: 16  Temp: 97.6 F (36.4 C)  SpO2: 98%    Wt Readings from Last 3 Encounters:  07/30/24 211 lb 6.7 oz (95.9 kg)  10/22/23 210 lb 6.4 oz (95.4 kg)  10/07/23 204 lb (92.5 kg)   Physical Exam Constitutional:      Appearance: Normal appearance.  Cardiovascular:     Rate and Rhythm: Normal rate and regular rhythm.  Pulmonary:     Effort: Pulmonary effort is normal.     Breath sounds: Normal breath sounds.  Abdominal:      General: Bowel sounds are normal.     Palpations: Abdomen is soft.  Musculoskeletal:        General: No swelling. Normal range of motion.  Neurological:     Mental Status: She is alert and oriented to person, place, and time. Mental status is at baseline.      LABORATORY DATA:  I have reviewed the data as listed    Latest Ref Rng & Units 07/30/2024   10:22 AM 07/23/2024    3:27 PM 07/25/2023    2:03 PM  CMP  Glucose 70 - 99 mg/dL 883  88  93   BUN 8 - 23 mg/dL 14  13  12    Creatinine 0.44 - 1.00 mg/dL 9.13  9.09  9.25   Sodium 135 - 145  mmol/L 139  140  133   Potassium 3.5 - 5.1 mmol/L 4.6  4.2  4.4   Chloride 98 - 111 mmol/L 102  103  102   CO2 22 - 32 mmol/L 25  24  22    Calcium 8.9 - 10.3 mg/dL 9.4  9.1  9.0   Total Protein 6.5 - 8.1 g/dL 7.2  6.9  7.1   Total Bilirubin 0.0 - 1.2 mg/dL 0.3  0.2  0.3   Alkaline Phos 38 - 126 U/L 84  83  70   AST 15 - 41 U/L 26  33  23   ALT 0 - 44 U/L 29  29  21     No results found for: [id number] Lab Results  Component Value Date   WBC 8.7 07/30/2024   HGB 10.7 (L) 07/30/2024   HCT 33.3 (L) 07/30/2024   MCV 84.1 07/30/2024   PLT 294 07/30/2024   NEUTROABS 5.4 07/30/2024    ASSESSMENT:  1.  Right breast infiltrating lobular carcinoma: - Status post right lumpectomy and lymph node biopsy on 04/14/2009, pT1b, PN 0, ER/PR positive, Ki-67 8%, margins negative, grade 1, 0.6 cm. -Patient was recommended tamoxifen  which she took for approximately 4 years.  This was discontinued secondary to endometrial hyperplasia. - Breast cancer index testing shows high likelihood of benefit. -She was subsequently switched to anastrozole  and could not tolerate secondary to musculoskeletal symptoms. -Mammogram on 06/08/2020 was BI-RADS Category 1. -CT abdomen on 07/12/2020 did not show any evidence of fatty liver or meta stasis. - Femara  completed in October 2021.   2.  Left breast IDC: -She had a left breast lumpectomy and lymph node biopsy on 04/26/2010.   Pathology showed 1.7 cm, grade 1, with low-grade DCIS, margins negative.  PT1CPN0.  Ki-67 was 9%.  ER/PR positive and HER-2 negative. -Bilateral lumpectomy sites within normal limits.  Bilateral breast has no palpable masses.   3.  Osteopenia: - DEXA scan on 11/27/2017 shows T score of -1.9.  This has improved from a T score of -2.3 on 11/22/2016. -She was offered Prolia but she refused it given the side effects.   PLAN:  1.  Right breast ILC and left breast IDC: - Completed 10 years of antiestrogen therapy in 2021. - Physical exam shows right breast upper outer quadrant lumpectomy scar with thickness in the middle is stable.  Left upper outer scar is stable.  No palpable mass.  No palpable adenopathy. - Reviewed labs from 07/23/24 with normal LFTs.  CBC was grossly normal. - Mammogram on 07/22/2024 BI-RADS Category 2 benign. - Recommend follow-up in 1 year with repeat mammogram and labs.   2.  Osteopenia: - Vitamin D  level is 50.41.  Continue vitamin D  supplements.  3.  Rectal bleeding: -Recently had colonoscopy which revealed 1 polyp which was a tubular adenoma.  There was an abnormal rectal lesion and she was referred to Dr. Sheldon for further evaluation.  Per his note, lesion appears to be a hemorrhoid and he recommended hemorrhoidectomy versus banding.  4.  Anemia: -Will check nutritional labs today.  -Iron level show elevated TIBC, iron saturation 9%, hemoglobin 10.7, B12 736.   -Would recommend 1-2 doses of IV iron based on patient's insurance.  -If iron deficient would not recommend oral iron as it may cause constipation which would worsen her hemorrhoid bleeding. -We did briefly discuss IV iroN which she may be agreeable for if her iron levels are low.  She is currently not taking supplements.  PLAN SUMMARY: >> Nutritional labs today will call with results. >> Return to clinic in 1 year for follow-up with labs a few days before and mammogram.  Mammogram orders placed.      Breast Cancer therapy associated bone loss: I have recommended calcium, Vitamin D  and weight bearing exercises.  Orders placed this encounter:  Orders Placed This Encounter  Procedures   MM 3D SCREENING MAMMOGRAM BILATERAL BREAST   Vitamin D  25 hydroxy   Comprehensive metabolic panel   CBC with Differential   Ferritin   Copper, serum   Vitamin B12   Methylmalonic acid, serum   Iron and TIBC   Folate   I spent 25 minutes dedicated to the care of this patient (face-to-face and non-face-to-face) on the date of the encounter to include what is described in the assessment and plan.  Delon Hope, NP 07/30/2024 12:37 PM

## 2024-07-30 NOTE — Addendum Note (Signed)
 Addended by: GEOFM NEST E on: 07/30/2024 02:16 PM   Modules accepted: Orders

## 2024-08-04 LAB — COPPER, SERUM: Copper: 133 ug/dL (ref 80–158)

## 2024-08-06 ENCOUNTER — Inpatient Hospital Stay

## 2024-08-06 VITALS — BP 129/71 | HR 59 | Temp 98.0°F | Resp 18

## 2024-08-06 DIAGNOSIS — D509 Iron deficiency anemia, unspecified: Secondary | ICD-10-CM | POA: Diagnosis not present

## 2024-08-06 DIAGNOSIS — D649 Anemia, unspecified: Secondary | ICD-10-CM

## 2024-08-06 LAB — METHYLMALONIC ACID, SERUM: Methylmalonic Acid, Quantitative: 193 nmol/L (ref 0–378)

## 2024-08-06 MED ORDER — SODIUM CHLORIDE 0.9 % IV SOLN: INTRAVENOUS | Status: DC

## 2024-08-06 MED ORDER — CETIRIZINE HCL 10 MG/ML IV SOLN
10.0000 mg | Freq: Once | INTRAVENOUS | Status: AC
  Administered 2024-08-06: 10 mg via INTRAVENOUS
  Filled 2024-08-06: qty 1

## 2024-08-06 MED ORDER — FAMOTIDINE IN NACL 20-0.9 MG/50ML-% IV SOLN
20.0000 mg | Freq: Once | INTRAVENOUS | Status: AC
  Administered 2024-08-06: 20 mg via INTRAVENOUS
  Filled 2024-08-06: qty 50

## 2024-08-06 MED ORDER — SODIUM CHLORIDE 0.9 % IV SOLN
510.0000 mg | Freq: Once | INTRAVENOUS | Status: AC
  Administered 2024-08-06: 510 mg via INTRAVENOUS
  Filled 2024-08-06: qty 510

## 2024-08-06 MED ORDER — LORATADINE 10 MG PO TABS: 10.0000 mg | ORAL_TABLET | Freq: Once | ORAL | Status: DC

## 2024-08-06 MED ORDER — ACETAMINOPHEN 325 MG PO TABS
650.0000 mg | ORAL_TABLET | Freq: Once | ORAL | Status: AC
  Administered 2024-08-06: 650 mg via ORAL
  Filled 2024-08-06: qty 2

## 2024-08-06 MED ORDER — METHYLPREDNISOLONE SODIUM SUCC 125 MG IJ SOLR
125.0000 mg | Freq: Once | INTRAMUSCULAR | Status: AC
  Administered 2024-08-06: 125 mg via INTRAVENOUS
  Filled 2024-08-06: qty 2

## 2024-08-06 NOTE — Patient Instructions (Signed)

## 2024-08-06 NOTE — Progress Notes (Signed)
 Patient presents today for Feraheme infusion per providers order.  Vital signs WNL.  Patient has no new complaints at this time.  Peripheral IV started and blood return noted pre and post infusion.  Stable during infusion without adverse affects.  Vital signs stable.  No complaints at this time.  Discharge from clinic ambulatory in stable condition.  Alert and oriented X 3.  Follow up with [facility] as scheduled.

## 2024-08-17 ENCOUNTER — Inpatient Hospital Stay: Attending: Oncology

## 2024-08-17 VITALS — BP 121/57 | HR 63 | Temp 97.0°F | Resp 18 | Wt 212.8 lb

## 2024-08-17 DIAGNOSIS — E611 Iron deficiency: Secondary | ICD-10-CM | POA: Insufficient documentation

## 2024-08-17 DIAGNOSIS — D649 Anemia, unspecified: Secondary | ICD-10-CM

## 2024-08-17 MED ORDER — SODIUM CHLORIDE 0.9 % IV SOLN
510.0000 mg | Freq: Once | INTRAVENOUS | Status: AC
  Administered 2024-08-17: 510 mg via INTRAVENOUS
  Filled 2024-08-17: qty 510

## 2024-08-17 MED ORDER — SODIUM CHLORIDE 0.9 % IV SOLN: INTRAVENOUS | Status: DC

## 2024-08-17 MED ORDER — ACETAMINOPHEN 325 MG PO TABS
650.0000 mg | ORAL_TABLET | Freq: Once | ORAL | Status: AC
  Administered 2024-08-17: 650 mg via ORAL
  Filled 2024-08-17: qty 2

## 2024-08-17 MED ORDER — METHYLPREDNISOLONE SODIUM SUCC 125 MG IJ SOLR
125.0000 mg | Freq: Once | INTRAMUSCULAR | Status: AC
  Administered 2024-08-17: 125 mg via INTRAVENOUS
  Filled 2024-08-17: qty 2

## 2024-08-17 MED ORDER — FAMOTIDINE IN NACL 20-0.9 MG/50ML-% IV SOLN
20.0000 mg | Freq: Once | INTRAVENOUS | Status: AC
  Administered 2024-08-17: 20 mg via INTRAVENOUS
  Filled 2024-08-17: qty 50

## 2024-08-17 MED ORDER — CETIRIZINE HCL 10 MG/ML IV SOLN
10.0000 mg | Freq: Once | INTRAVENOUS | Status: AC
  Administered 2024-08-17: 10 mg via INTRAVENOUS
  Filled 2024-08-17: qty 1

## 2024-08-17 NOTE — Progress Notes (Signed)
 Patient presents today for Feraheme infusion. Vital signs stable. Patient states she had issues with constipation after last iron infusion. Patient teaching performed pertaining management of constipation. Understanding verbalized.   Feraheme given today per MD orders. Tolerated infusion without adverse affects. Vital signs stable. No complaints at this time. Discharged from clinic ambulatory in stable condition. Alert and oriented x 3. F/U with [facility] as scheduled.

## 2024-08-17 NOTE — Patient Instructions (Signed)
 [facility] [place] - A DEPT OF [facility]  Discharge Instructions: Thank you for choosing [facility] to provide your oncology and hematology care.  If you have a lab appointment with the Cancer Center - please note that after April 8th, 2024, all labs will be drawn in the cancer center.  You do not have to check in or register with the main entrance as you have in the past but will complete your check-in in the cancer center.  Wear comfortable clothing and clothing appropriate for easy access to any Portacath or PICC line.   We strive to give you quality time with your provider. You may need to reschedule your appointment if you arrive late (15 or more minutes).  Arriving late affects you and other patients whose appointments are after yours.  Also, if you miss three or more appointments without notifying the office, you may be dismissed from the clinic at the provider's discretion.      For prescription refill requests, have your pharmacy contact our office and allow 72 hours for refills to be completed.    Today you received the following chemotherapy and/or immunotherapy agents feraheme. Ferumoxytol Injection What is this medication? FERUMOXYTOL (FER ue MOX i tol) treats low levels of iron in your body (iron deficiency anemia). Iron is a mineral that plays an important role in making red blood cells, which carry oxygen from your lungs to the rest of your body. This medicine may be used for other purposes; ask your health care provider or pharmacist if you have questions. COMMON BRAND NAME(S): Feraheme What should I tell my care team before I take this medication? They need to know if you have any of these conditions: Anemia not caused by low iron levels High levels of iron in the blood Magnetic resonance imaging (MRI) test scheduled An unusual or allergic reaction to iron, other medications, foods, dyes, or preservatives Pregnant or trying to get  pregnant Breastfeeding How should I use this medication? This medication is injected into a vein. It is given by your care team in a hospital or clinic setting. Talk to your care team the use of this medication in children. Special care may be needed. Overdosage: If you think you have taken too much of this medicine contact a poison control center or emergency room at once. NOTE: This medicine is only for you. Do not share this medicine with others. What if I miss a dose? It is important not to miss your dose. Call your care team if you are unable to keep an appointment. What may interact with this medication? Other iron products This list may not describe all possible interactions. Give your health care provider a list of all the medicines, herbs, non-prescription drugs, or dietary supplements you use. Also tell them if you smoke, drink alcohol, or use illegal drugs. Some items may interact with your medicine. What should I watch for while using this medication? Visit your care team for regular checks on your progress. Tell your care team if your symptoms do not start to get better or if they get worse. You may need blood work done while you are taking this medication. You may need to eat more foods that contain iron. Talk to your care team. Foods that contain iron include whole grains or cereals, dried fruits, beans, peas, leafy green vegetables, and organ meats (liver, kidney). What side effects may I notice from receiving this medication? Side effects that  you should report to your care team as soon as possible: Allergic reactions--skin rash, itching, hives, swelling of the face, lips, tongue, or throat Low blood pressure--dizziness, feeling faint or lightheaded, blurry vision Shortness of breath Side effects that usually do not require medical attention (report to your care team if they continue or are bothersome): Flushing Headache Joint pain Muscle pain Nausea Pain, redness, or  irritation at injection site This list may not describe all possible side effects. Call your doctor for medical advice about side effects. You may report side effects to FDA at 1-800-FDA-1088. Where should I keep my medication? This medication is given in a hospital or clinic. It will not be stored at home. NOTE: This sheet is a summary. It may not cover all possible information. If you have questions about this medicine, talk to your doctor, pharmacist, or health care provider.  2024 Elsevier/Gold Standard (2023-04-24 00:00:00)      To help prevent nausea and vomiting after your treatment, we encourage you to take your nausea medication as directed.  BELOW ARE SYMPTOMS THAT SHOULD BE REPORTED IMMEDIATELY: *FEVER GREATER THAN 100.4 F (38 C) OR HIGHER *CHILLS OR SWEATING *NAUSEA AND VOMITING THAT IS NOT CONTROLLED WITH YOUR NAUSEA MEDICATION *UNUSUAL SHORTNESS OF BREATH *UNUSUAL BRUISING OR BLEEDING *URINARY PROBLEMS (pain or burning when urinating, or frequent urination) *BOWEL PROBLEMS (unusual diarrhea, constipation, pain near the anus) TENDERNESS IN MOUTH AND THROAT WITH OR WITHOUT PRESENCE OF ULCERS (sore throat, sores in mouth, or a toothache) UNUSUAL RASH, SWELLING OR PAIN  UNUSUAL VAGINAL DISCHARGE OR ITCHING   Items with * indicate a potential emergency and should be followed up as soon as possible or go to the Emergency Department if any problems should occur.  Please show the CHEMOTHERAPY ALERT CARD or IMMUNOTHERAPY ALERT CARD at check-in to the Emergency Department and triage nurse.  Should you have questions after your visit or need to cancel or reschedule your appointment, please contact [facility] [place] - A DEPT OF [facility] [phone]  and follow the prompts.  Office hours are 8:00 a.m. to 4:30 p.m. Monday - Friday. Please note that voicemails left after 4:00 p.m. may not be returned until the following business day.  We are closed weekends  and major holidays. You have access to a nurse at all times for urgent questions. Please call the main number to the clinic [phone] and follow the prompts.  For any non-urgent questions, you may also contact your provider using MyChart. We now offer e-Visits for anyone 38 and older to request care online for non-urgent symptoms. For details visit [email or web address].   Also download the MyChart app! Go to the app store, search "MyChart", open the app, select [place], and log in with your MyChart username and password.

## 2024-08-17 NOTE — Progress Notes (Signed)
 Patient presents today for iron infusion.  Patient is in satisfactory condition with no new complaints voiced.  Vital signs are stable.  IV placed in R arm. IV flushed well with good blood return noted.  We will proceed with infusion per provider orders.

## 2025-07-20 ENCOUNTER — Ambulatory Visit

## 2025-07-29 ENCOUNTER — Inpatient Hospital Stay

## 2025-08-05 ENCOUNTER — Inpatient Hospital Stay: Admitting: Oncology
